# Patient Record
Sex: Female | Born: 1945 | Race: Black or African American | Hispanic: No | Marital: Married | Smoking: Former smoker
Health system: Southern US, Community
[De-identification: ages and names within clinical notes are randomized; demographics above are authoritative.]

## PROBLEM LIST (undated history)

## (undated) DIAGNOSIS — Z6841 Body Mass Index (BMI) 40.0 and over, adult: Secondary | ICD-10-CM

## (undated) DIAGNOSIS — I509 Heart failure, unspecified: Secondary | ICD-10-CM

## (undated) DIAGNOSIS — Z973 Presence of spectacles and contact lenses: Secondary | ICD-10-CM

## (undated) DIAGNOSIS — I10 Essential (primary) hypertension: Secondary | ICD-10-CM

## (undated) DIAGNOSIS — N184 Chronic kidney disease, stage 4 (severe): Secondary | ICD-10-CM

## (undated) DIAGNOSIS — I480 Paroxysmal atrial fibrillation: Secondary | ICD-10-CM

## (undated) DIAGNOSIS — J189 Pneumonia, unspecified organism: Secondary | ICD-10-CM

## (undated) DIAGNOSIS — M069 Rheumatoid arthritis, unspecified: Secondary | ICD-10-CM

## (undated) DIAGNOSIS — E119 Type 2 diabetes mellitus without complications: Secondary | ICD-10-CM

## (undated) DIAGNOSIS — T4145XA Adverse effect of unspecified anesthetic, initial encounter: Secondary | ICD-10-CM

## (undated) DIAGNOSIS — Z972 Presence of dental prosthetic device (complete) (partial): Secondary | ICD-10-CM

## (undated) DIAGNOSIS — R011 Cardiac murmur, unspecified: Secondary | ICD-10-CM

## (undated) DIAGNOSIS — K219 Gastro-esophageal reflux disease without esophagitis: Secondary | ICD-10-CM

## (undated) DIAGNOSIS — T8859XA Other complications of anesthesia, initial encounter: Secondary | ICD-10-CM

## (undated) DIAGNOSIS — G4733 Obstructive sleep apnea (adult) (pediatric): Secondary | ICD-10-CM

## (undated) DIAGNOSIS — D649 Anemia, unspecified: Secondary | ICD-10-CM

## (undated) HISTORY — PX: HYSTEROTOMY: SHX1776

## (undated) HISTORY — PX: ABDOMINAL HYSTERECTOMY: SHX81

## (undated) HISTORY — DX: Obstructive sleep apnea (adult) (pediatric): G47.33

## (undated) HISTORY — PX: COLONOSCOPY W/ BIOPSIES AND POLYPECTOMY: SHX1376

## (undated) HISTORY — PX: DILATION AND CURETTAGE OF UTERUS: SHX78

## (undated) HISTORY — PX: GALLBLADDER SURGERY: SHX652

## (undated) HISTORY — PX: KNEE ARTHROSCOPY: SUR90

## (undated) HISTORY — PX: ANKLE FUSION: SHX881

## (undated) HISTORY — DX: Paroxysmal atrial fibrillation: I48.0

## (undated) HISTORY — PX: TUBAL LIGATION: SHX77

## (undated) HISTORY — PX: MULTIPLE TOOTH EXTRACTIONS: SHX2053

## (undated) HISTORY — DX: Type 2 diabetes mellitus without complications: E11.9

## (undated) HISTORY — PX: BREAST BIOPSY: SHX20

## (undated) HISTORY — PX: CHOLECYSTECTOMY: SHX55

## (undated) HISTORY — DX: Essential (primary) hypertension: I10

---

## 2000-04-06 ENCOUNTER — Encounter: Payer: Self-pay | Admitting: Rheumatology

## 2000-04-06 ENCOUNTER — Ambulatory Visit (HOSPITAL_COMMUNITY): Admission: RE | Admit: 2000-04-06 | Discharge: 2000-04-06 | Payer: Self-pay | Admitting: Rheumatology

## 2000-08-26 ENCOUNTER — Encounter: Payer: Self-pay | Admitting: Family Medicine

## 2000-08-26 ENCOUNTER — Ambulatory Visit (HOSPITAL_COMMUNITY): Admission: RE | Admit: 2000-08-26 | Discharge: 2000-08-26 | Payer: Self-pay | Admitting: Family Medicine

## 2001-09-12 ENCOUNTER — Encounter: Payer: Self-pay | Admitting: Emergency Medicine

## 2001-09-12 ENCOUNTER — Emergency Department (HOSPITAL_COMMUNITY): Admission: EM | Admit: 2001-09-12 | Discharge: 2001-09-12 | Payer: Self-pay | Admitting: Emergency Medicine

## 2001-09-20 ENCOUNTER — Ambulatory Visit (HOSPITAL_COMMUNITY): Admission: RE | Admit: 2001-09-20 | Discharge: 2001-09-20 | Payer: Self-pay | Admitting: Family Medicine

## 2001-09-24 ENCOUNTER — Encounter: Payer: Self-pay | Admitting: Family Medicine

## 2001-09-24 ENCOUNTER — Ambulatory Visit (HOSPITAL_COMMUNITY): Admission: RE | Admit: 2001-09-24 | Discharge: 2001-09-24 | Payer: Self-pay | Admitting: Family Medicine

## 2002-11-04 ENCOUNTER — Emergency Department (HOSPITAL_COMMUNITY): Admission: EM | Admit: 2002-11-04 | Discharge: 2002-11-04 | Payer: Self-pay | Admitting: Emergency Medicine

## 2005-06-10 ENCOUNTER — Other Ambulatory Visit: Admission: RE | Admit: 2005-06-10 | Discharge: 2005-06-10 | Payer: Self-pay | Admitting: Family Medicine

## 2007-08-12 ENCOUNTER — Other Ambulatory Visit: Admission: RE | Admit: 2007-08-12 | Discharge: 2007-08-12 | Payer: Self-pay | Admitting: Family Medicine

## 2007-09-14 ENCOUNTER — Other Ambulatory Visit: Admission: RE | Admit: 2007-09-14 | Discharge: 2007-09-14 | Payer: Self-pay | Admitting: Family Medicine

## 2008-03-28 ENCOUNTER — Ambulatory Visit (HOSPITAL_BASED_OUTPATIENT_CLINIC_OR_DEPARTMENT_OTHER): Admission: RE | Admit: 2008-03-28 | Discharge: 2008-03-28 | Payer: Self-pay | Admitting: Orthopaedic Surgery

## 2008-12-21 ENCOUNTER — Emergency Department (HOSPITAL_COMMUNITY): Admission: EM | Admit: 2008-12-21 | Discharge: 2008-12-21 | Payer: Self-pay | Admitting: Emergency Medicine

## 2009-06-23 ENCOUNTER — Emergency Department (HOSPITAL_COMMUNITY): Admission: EM | Admit: 2009-06-23 | Discharge: 2009-06-23 | Payer: Self-pay | Admitting: Emergency Medicine

## 2010-04-28 ENCOUNTER — Inpatient Hospital Stay (HOSPITAL_COMMUNITY): Admission: EM | Admit: 2010-04-28 | Discharge: 2010-05-09 | Payer: Self-pay | Admitting: Emergency Medicine

## 2010-04-29 ENCOUNTER — Encounter (INDEPENDENT_AMBULATORY_CARE_PROVIDER_SITE_OTHER): Payer: Self-pay | Admitting: Internal Medicine

## 2010-05-04 ENCOUNTER — Encounter (INDEPENDENT_AMBULATORY_CARE_PROVIDER_SITE_OTHER): Payer: Self-pay | Admitting: Internal Medicine

## 2011-03-18 LAB — BODY FLUID CELL COUNT WITH DIFFERENTIAL

## 2011-03-18 LAB — DIFFERENTIAL
Basophils Absolute: 0 10*3/uL (ref 0.0–0.1)
Basophils Relative: 0 % (ref 0–1)
Eosinophils Relative: 0 % (ref 0–5)
Eosinophils Relative: 0 % (ref 0–5)
Lymphocytes Relative: 18 % (ref 12–46)
Lymphs Abs: 1.5 10*3/uL (ref 0.7–4.0)
Lymphs Abs: 3.8 10*3/uL (ref 0.7–4.0)
Monocytes Absolute: 1.7 10*3/uL — ABNORMAL HIGH (ref 0.1–1.0)
Monocytes Absolute: 1.9 10*3/uL — ABNORMAL HIGH (ref 0.1–1.0)
Monocytes Relative: 12 % (ref 3–12)
Monocytes Relative: 3 % (ref 3–12)
Monocytes Relative: 6 % (ref 3–12)
Neutro Abs: 15.2 10*3/uL — ABNORMAL HIGH (ref 1.7–7.7)
Neutro Abs: 9.4 10*3/uL — ABNORMAL HIGH (ref 1.7–7.7)
Neutrophils Relative %: 73 % (ref 43–77)
Neutrophils Relative %: 93 % — ABNORMAL HIGH (ref 43–77)
Neutrophils Relative %: 87 % — ABNORMAL HIGH (ref 43–77)

## 2011-03-18 LAB — CULTURE, BLOOD (ROUTINE X 2): Culture: NO GROWTH

## 2011-03-18 LAB — BASIC METABOLIC PANEL
BUN: 22 mg/dL (ref 6–23)
BUN: 28 mg/dL — ABNORMAL HIGH (ref 6–23)
BUN: 28 mg/dL — ABNORMAL HIGH (ref 6–23)
CO2: 21 mEq/L (ref 19–32)
Calcium: 7.9 mg/dL — ABNORMAL LOW (ref 8.4–10.5)
Calcium: 8.6 mg/dL (ref 8.4–10.5)
Calcium: 8.6 mg/dL (ref 8.4–10.5)
Chloride: 105 mEq/L (ref 96–112)
Chloride: 111 mEq/L (ref 96–112)
Creatinine, Ser: 1.75 mg/dL — ABNORMAL HIGH (ref 0.4–1.2)
GFR calc Af Amer: 36 mL/min — ABNORMAL LOW (ref >60)
GFR calc Af Amer: 45 mL/min — ABNORMAL LOW (ref >60)
GFR calc Af Amer: 47 mL/min — ABNORMAL LOW (ref >60)
GFR calc Af Amer: 50 mL/min — ABNORMAL LOW (ref >60)
GFR calc Af Amer: 58 mL/min — ABNORMAL LOW (ref >60)
GFR calc non Af Amer: 29 mL/min — ABNORMAL LOW (ref >60)
GFR calc non Af Amer: 36 mL/min — ABNORMAL LOW (ref >60)
GFR calc non Af Amer: 39 mL/min — ABNORMAL LOW (ref >60)
GFR calc non Af Amer: 41 mL/min — ABNORMAL LOW (ref >60)
GFR calc non Af Amer: 41 mL/min — ABNORMAL LOW (ref >60)
GFR calc non Af Amer: 48 mL/min — ABNORMAL LOW (ref >60)
Glucose, Bld: 133 mg/dL — ABNORMAL HIGH (ref 70–99)
Glucose, Bld: 143 mg/dL — ABNORMAL HIGH (ref 70–99)
Glucose, Bld: 231 mg/dL — ABNORMAL HIGH (ref 70–99)
Potassium: 4.1 mEq/L (ref 3.5–5.1)
Potassium: 4.2 mEq/L (ref 3.5–5.1)
Potassium: 4.5 mEq/L (ref 3.5–5.1)
Potassium: 4.7 mEq/L (ref 3.5–5.1)
Potassium: 4.8 mEq/L (ref 3.5–5.1)
Potassium: 4.8 mEq/L (ref 3.5–5.1)
Potassium: 5.1 mEq/L (ref 3.5–5.1)
Sodium: 139 mEq/L (ref 135–145)
Sodium: 140 mEq/L (ref 135–145)
Sodium: 140 mEq/L (ref 135–145)
Sodium: 142 mEq/L (ref 135–145)
Sodium: 144 mEq/L (ref 135–145)

## 2011-03-18 LAB — T4, FREE: Free T4: 0.87 ng/dL (ref 0.80–1.80)

## 2011-03-18 LAB — CBC
HCT: 28.3 % — ABNORMAL LOW (ref 36.0–46.0)
HCT: 29.8 % — ABNORMAL LOW (ref 36.0–46.0)
HCT: 30 % — ABNORMAL LOW (ref 36.0–46.0)
HCT: 31 % — ABNORMAL LOW (ref 36.0–46.0)
HCT: 36 % (ref 36.0–46.0)
Hemoglobin: 10 g/dL — ABNORMAL LOW (ref 12.0–15.0)
Hemoglobin: 9 g/dL — ABNORMAL LOW (ref 12.0–15.0)
Hemoglobin: 9.6 g/dL — ABNORMAL LOW (ref 12.0–15.0)
Hemoglobin: 9.9 g/dL — ABNORMAL LOW (ref 12.0–15.0)
MCHC: 32 g/dL (ref 30.0–36.0)
MCHC: 32.2 g/dL (ref 30.0–36.0)
MCHC: 32.4 g/dL (ref 30.0–36.0)
MCV: 87.1 fL (ref 78.0–100.0)
MCV: 87.3 fL (ref 78.0–100.0)
MCV: 87.3 fL (ref 78.0–100.0)
MCV: 88 fL (ref 78.0–100.0)
MCV: 86.7 fL (ref 78.0–100.0)
Platelets: 186 10*3/uL (ref 150–400)
Platelets: 235 10*3/uL (ref 150–400)
Platelets: 249 10*3/uL (ref 150–400)
Platelets: 251 10*3/uL (ref 150–400)
RBC: 3.13 MIL/uL — ABNORMAL LOW (ref 3.87–5.11)
RBC: 3.19 MIL/uL — ABNORMAL LOW (ref 3.87–5.11)
RBC: 3.41 MIL/uL — ABNORMAL LOW (ref 3.87–5.11)
RBC: 3.55 MIL/uL — ABNORMAL LOW (ref 3.87–5.11)
RDW: 14.4 % (ref 11.5–15.5)
RDW: 14.5 % (ref 11.5–15.5)
RDW: 14.9 % (ref 11.5–15.5)
RDW: 14.3 % (ref 11.5–15.5)
WBC: 19.6 10*3/uL — ABNORMAL HIGH (ref 4.0–10.5)
WBC: 20.9 10*3/uL — ABNORMAL HIGH (ref 4.0–10.5)
WBC: 23.5 10*3/uL — ABNORMAL HIGH (ref 4.0–10.5)
WBC: 30.1 10*3/uL — ABNORMAL HIGH (ref 4.0–10.5)
WBC: 36.8 10*3/uL — ABNORMAL HIGH (ref 4.0–10.5)

## 2011-03-18 LAB — URINALYSIS, ROUTINE W REFLEX MICROSCOPIC
Glucose, UA: NEGATIVE mg/dL
Hgb urine dipstick: NEGATIVE
Ketones, ur: NEGATIVE mg/dL
Leukocytes, UA: NEGATIVE
Protein, ur: 100 mg/dL — AB
Specific Gravity, Urine: 1.016 (ref 1.005–1.030)
Urobilinogen, UA: 0.2 mg/dL (ref 0.0–1.0)

## 2011-03-18 LAB — GLUCOSE, SEROUS FLUID: Glucose, Fluid: 142 mg/dL

## 2011-03-18 LAB — COMPREHENSIVE METABOLIC PANEL
Albumin: 2.4 g/dL — ABNORMAL LOW (ref 3.5–5.2)
Albumin: 3.2 g/dL — ABNORMAL LOW (ref 3.5–5.2)
Alkaline Phosphatase: 55 U/L (ref 39–117)
BUN: 23 mg/dL (ref 6–23)
Creatinine, Ser: 1.78 mg/dL — ABNORMAL HIGH (ref 0.4–1.2)
Glucose, Bld: 109 mg/dL — ABNORMAL HIGH (ref 70–99)
Potassium: 3.9 mEq/L (ref 3.5–5.1)
Total Bilirubin: 0.1 mg/dL — ABNORMAL LOW (ref 0.3–1.2)
Total Protein: 5.6 g/dL — ABNORMAL LOW (ref 6.0–8.3)
Total Protein: 6.8 g/dL (ref 6.0–8.3)

## 2011-03-18 LAB — AFB CULTURE WITH SMEAR (NOT AT ARMC): Acid Fast Smear: NONE SEEN

## 2011-03-18 LAB — UIFE/LIGHT CHAINS/TP QN, 24-HR UR
Albumin, U: DETECTED
Free Lambda Excretion/Day: 36.45 mg/d
Free Lambda Lt Chains,Ur: 2.7 mg/dL — ABNORMAL HIGH (ref 0.08–1.01)
Free Lt Chn Excr Rate: 174.15 mg/d
Gamma Globulin, Urine: DETECTED — AB
Time: 24 hours
Total Protein, Urine-Ur/day: 328 mg/d — ABNORMAL HIGH (ref 10–140)
Volume, Urine: 1350 mL

## 2011-03-18 LAB — IGG, IGA, IGM
IgA: 292 mg/dL (ref 68–378)
IgG (Immunoglobin G), Serum: 716 mg/dL (ref 694–1618)
IgM, Serum: 79 mg/dL (ref 60–263)

## 2011-03-18 LAB — VITAMIN B12: Vitamin B-12: 531 pg/mL (ref 211–911)

## 2011-03-18 LAB — URINE CULTURE

## 2011-03-18 LAB — SEDIMENTATION RATE: Sed Rate: 104 mm/hr — ABNORMAL HIGH (ref 0–22)

## 2011-03-18 LAB — CK TOTAL AND CKMB (NOT AT ARMC)
Relative Index: 0.8 (ref 0.0–2.5)
Total CK: 320 U/L — ABNORMAL HIGH (ref 7–177)

## 2011-03-18 LAB — PROTEIN, TOTAL: Total Protein: 6.5 g/dL (ref 6.0–8.3)

## 2011-03-18 LAB — RETICULOCYTES: RBC.: 3.54 MIL/uL — ABNORMAL LOW (ref 3.87–5.11)

## 2011-03-18 LAB — PROTEIN ELECTROPH W RFLX QUANT IMMUNOGLOBULINS
Alpha-2-Globulin: 15 % — ABNORMAL HIGH (ref 7.1–11.8)
Beta Globulin: 7.2 % (ref 4.7–7.2)
Gamma Globulin: 11.2 % (ref 11.1–18.8)
M-Spike, %: NOT DETECTED g/dL

## 2011-03-18 LAB — CARDIAC PANEL(CRET KIN+CKTOT+MB+TROPI)
Relative Index: 0.8 (ref 0.0–2.5)
Total CK: 309 U/L — ABNORMAL HIGH (ref 7–177)

## 2011-03-18 LAB — SODIUM, URINE, RANDOM: Sodium, Ur: 39 mEq/L

## 2011-03-18 LAB — BODY FLUID CULTURE: Culture: NO GROWTH

## 2011-03-18 LAB — LACTATE DEHYDROGENASE, PLEURAL OR PERITONEAL FLUID: LD, Fluid: 214 U/L

## 2011-03-18 LAB — LACTATE DEHYDROGENASE: LDH: 168 U/L (ref 94–250)

## 2011-03-18 LAB — LIPASE, BLOOD: Lipase: 18 U/L (ref 11–59)

## 2011-03-18 LAB — IRON AND TIBC
Iron: 13 ug/dL — ABNORMAL LOW (ref 42–135)
TIBC: 200 ug/dL — ABNORMAL LOW (ref 250–470)
UIBC: 187 ug/dL

## 2011-03-18 LAB — PATHOLOGIST SMEAR REVIEW

## 2011-03-18 LAB — URINE MICROSCOPIC-ADD ON

## 2011-03-18 LAB — FERRITIN: Ferritin: 229 ng/mL (ref 10–291)

## 2011-05-13 NOTE — Op Note (Signed)
Melissa Bartlett, Melissa Bartlett              ACCOUNT NO.:  [id number]   MEDICAL RECORD NO.:  [id number]          PATIENT TYPE:  AMB   LOCATION:  DSC                          FACILITY:  [facility]   PHYSICIAN:  Monico Blitz. Dalldorf, M.D.DATE OF BIRTH:  1946/06/08   DATE OF PROCEDURE:  July 21, 202009  DATE OF DISCHARGE:                               OPERATIVE REPORT   PREOPERATIVE DIAGNOSES:  1. Left knee torn medial meniscus.  2. Left knee chondromalacia patella.   POSTOPERATIVE DIAGNOSES:  1. Left knee torn medial meniscus.  2. Left knee chondromalacia patella.   PROCEDURES:  1. Left knee partial meniscectomy.  2. Left knee abrasion chondroplasty patellofemoral.   ANESTHESIA:  Knee block and general.   ATTENDING SURGEON:  Melrose Nakayama, M.D.   ASSISTANT:  Roselee Nova, PA.   INDICATIONS FOR PROCEDURE:  The patient is a 65 year old woman with a  long history of left knee pain and locking.  This has persisted despite  oral anti-inflammatories and rest.  She has pain which limits her  ability to walk and rest and she is offered an arthroscopy with MRI  evidence of a displaced meniscus tear.  Informed operative consent was  obtained after discussion of possible complications of reaction to  anesthesia and infection.   SUMMARY OF FINDINGS AND PROCEDURE:  Under general anesthesia and a  block, left knee arthroscopy was performed.  Suprapatellar pouch was  benign while the patellofemoral joint exhibited focal grade 4 change  addressed with an abrasion to bleeding bone in tiny areas.  For the most  part, the cartilage was fairly well maintained.  In the medial  compartment she had some grade II change and did have a displaced small  posterior horn medial meniscus tear addressed with removal of 15% of the  meniscus back to stable structures.  The ACL was intact.  Lateral  compartment exhibited a small radial tear of the meniscus addressed with  a partial lateral meniscectomy, but there were no  degenerative changes.   DESCRIPTION OF PROCEDURE:  The patient was taken to the operating suite  where general anesthetic was applied without difficulty.  She was also  given a block in the preanesthesia area.  She was positioned supine,  prepped and draped in normal sterile fashion.  After the administration  of IV Kefzol, an arthroscopy of the left knee was performed through a  total of two portals.  Findings were as noted above.  Procedure  consisted of the partial medial meniscectomy with basket and shaver  removing 15% of this structure back to stable rim.  Then performed a  brief chondroplasty in this compartment and concentrating on the  patellofemoral compartment with a chondroplasty and abrasion to bleeding  bone in a tiny area of the intertrochlear groove.  The knee was  thoroughly irrigated followed by placement of Marcaine with epinephrine  and morphine.  Adaptic was placed over the portals followed by dry gauze  and loose Ace wrap.  Estimated blood loss and fluids can be obtained  from the Anesthesia records.   DISPOSITION:  The patient was extubated in the operating room  and taken  to the recovery room in stable addition.  She was to go home same-day  and follow up in the office next week.  I will contact her by phone  tonight.      Monico Blitz Rhona Raider, M.D.  Electronically Signed     PGD/MEDQ  D:  September 14, 202009  T:  September 14, 202009  Job:  [id number]

## 2011-07-05 ENCOUNTER — Emergency Department (HOSPITAL_COMMUNITY): Payer: 59

## 2011-07-05 ENCOUNTER — Inpatient Hospital Stay (HOSPITAL_COMMUNITY)
Admission: EM | Admit: 2011-07-05 | Discharge: 2011-07-09 | DRG: 190 | Disposition: A | Discharge status: Home / Self Care | Payer: 59 | Attending: Internal Medicine | Admitting: Internal Medicine

## 2011-07-05 DIAGNOSIS — J441 Chronic obstructive pulmonary disease with (acute) exacerbation: Principal | ICD-10-CM | POA: Diagnosis present

## 2011-07-05 DIAGNOSIS — I472 Ventricular tachycardia, unspecified: Secondary | ICD-10-CM | POA: Diagnosis not present

## 2011-07-05 DIAGNOSIS — E876 Hypokalemia: Secondary | ICD-10-CM | POA: Diagnosis present

## 2011-07-05 DIAGNOSIS — E119 Type 2 diabetes mellitus without complications: Secondary | ICD-10-CM | POA: Diagnosis present

## 2011-07-05 DIAGNOSIS — E871 Hypo-osmolality and hyponatremia: Secondary | ICD-10-CM | POA: Diagnosis present

## 2011-07-05 DIAGNOSIS — I129 Hypertensive chronic kidney disease with stage 1 through stage 4 chronic kidney disease, or unspecified chronic kidney disease: Secondary | ICD-10-CM | POA: Diagnosis present

## 2011-07-05 DIAGNOSIS — T380X5A Adverse effect of glucocorticoids and synthetic analogues, initial encounter: Secondary | ICD-10-CM | POA: Diagnosis not present

## 2011-07-05 DIAGNOSIS — D72829 Elevated white blood cell count, unspecified: Secondary | ICD-10-CM | POA: Diagnosis not present

## 2011-07-05 DIAGNOSIS — N183 Chronic kidney disease, stage 3 unspecified: Secondary | ICD-10-CM | POA: Diagnosis present

## 2011-07-05 DIAGNOSIS — J9600 Acute respiratory failure, unspecified whether with hypoxia or hypercapnia: Secondary | ICD-10-CM | POA: Diagnosis present

## 2011-07-05 DIAGNOSIS — D649 Anemia, unspecified: Secondary | ICD-10-CM | POA: Diagnosis present

## 2011-07-05 DIAGNOSIS — R0989 Other specified symptoms and signs involving the circulatory and respiratory systems: Secondary | ICD-10-CM | POA: Diagnosis present

## 2011-07-05 DIAGNOSIS — E669 Obesity, unspecified: Secondary | ICD-10-CM | POA: Diagnosis present

## 2011-07-05 DIAGNOSIS — I4729 Other ventricular tachycardia: Secondary | ICD-10-CM | POA: Diagnosis not present

## 2011-07-05 DIAGNOSIS — F17200 Nicotine dependence, unspecified, uncomplicated: Secondary | ICD-10-CM | POA: Diagnosis present

## 2011-07-05 LAB — DIFFERENTIAL
Basophils Absolute: 0.1 10*3/uL (ref 0.0–0.1)
Eosinophils Absolute: 0.3 10*3/uL (ref 0.0–0.7)
Lymphocytes Relative: 31 % (ref 12–46)
Lymphs Abs: 3.2 10*3/uL (ref 0.7–4.0)
Neutrophils Relative %: 51 % (ref 43–77)

## 2011-07-05 LAB — CBC
MCV: 85.7 fL (ref 78.0–100.0)
Platelets: 217 10*3/uL (ref 150–400)
RBC: 4.2 MIL/uL (ref 3.87–5.11)
RDW: 14.4 % (ref 11.5–15.5)
WBC: 10.4 10*3/uL (ref 4.0–10.5)

## 2011-07-06 LAB — COMPREHENSIVE METABOLIC PANEL
ALT: 49 U/L — ABNORMAL HIGH (ref 0–35)
AST: 44 U/L — ABNORMAL HIGH (ref 0–37)
Calcium: 8.5 mg/dL (ref 8.4–10.5)
Sodium: 134 mEq/L — ABNORMAL LOW (ref 135–145)
Total Protein: 7.2 g/dL (ref 6.0–8.3)

## 2011-07-06 LAB — LIPID PANEL
Cholesterol: 150 mg/dL (ref 0–200)
Total CHOL/HDL Ratio: 3.3 RATIO
Triglycerides: 123 mg/dL (ref <150)
VLDL: 25 mg/dL (ref 0–40)

## 2011-07-06 LAB — PROTIME-INR: INR: 1.01 (ref 0.00–1.49)

## 2011-07-06 LAB — DIFFERENTIAL
Basophils Absolute: 0.1 10*3/uL (ref 0.0–0.1)
Lymphocytes Relative: 11 % — ABNORMAL LOW (ref 12–46)
Lymphs Abs: 1.1 10*3/uL (ref 0.7–4.0)
Neutro Abs: 8.4 10*3/uL — ABNORMAL HIGH (ref 1.7–7.7)
Neutrophils Relative %: 84 % — ABNORMAL HIGH (ref 43–77)

## 2011-07-06 LAB — TSH: TSH: 0.901 u[IU]/mL (ref 0.350–4.500)

## 2011-07-06 LAB — BASIC METABOLIC PANEL
CO2: 25 mEq/L (ref 19–32)
Chloride: 98 mEq/L (ref 96–112)
Creatinine, Ser: 1.56 mg/dL — ABNORMAL HIGH (ref 0.50–1.10)
GFR calc Af Amer: 40 mL/min — ABNORMAL LOW (ref >60)
Potassium: 3.3 mEq/L — ABNORMAL LOW (ref 3.5–5.1)
Sodium: 134 mEq/L — ABNORMAL LOW (ref 135–145)

## 2011-07-06 LAB — CK TOTAL AND CKMB (NOT AT ARMC)
CK, MB: 5.8 ng/mL — ABNORMAL HIGH (ref 0.3–4.0)
Relative Index: 2.5 (ref 0.0–2.5)

## 2011-07-06 LAB — CBC
HCT: 36.5 % (ref 36.0–46.0)
Hemoglobin: 11.5 g/dL — ABNORMAL LOW (ref 12.0–15.0)
MCV: 86.1 fL (ref 78.0–100.0)
RBC: 4.24 MIL/uL (ref 3.87–5.11)
RDW: 14.4 % (ref 11.5–15.5)
WBC: 9.9 10*3/uL (ref 4.0–10.5)

## 2011-07-06 LAB — D-DIMER, QUANTITATIVE: D-Dimer, Quant: 1.41 ug/mL-FEU — ABNORMAL HIGH (ref 0.00–0.48)

## 2011-07-06 LAB — GLUCOSE, CAPILLARY

## 2011-07-06 LAB — TROPONIN I: Troponin I: <0.3 ng/mL (ref <0.30)

## 2011-07-06 LAB — APTT: aPTT: 30 seconds (ref 24–37)

## 2011-07-07 LAB — BASIC METABOLIC PANEL
CO2: 23 mEq/L (ref 19–32)
Calcium: 8.6 mg/dL (ref 8.4–10.5)
Chloride: 105 mEq/L (ref 96–112)
GFR calc Af Amer: 38 mL/min — ABNORMAL LOW (ref >60)
Sodium: 138 mEq/L (ref 135–145)

## 2011-07-07 LAB — GLUCOSE, CAPILLARY
Glucose-Capillary: 278 mg/dL — ABNORMAL HIGH (ref 70–99)
Glucose-Capillary: 172 mg/dL — ABNORMAL HIGH (ref 70–99)
Glucose-Capillary: 184 mg/dL — ABNORMAL HIGH (ref 70–99)
Glucose-Capillary: 77 mg/dL (ref 70–99)

## 2011-07-07 LAB — CBC
HCT: 33.7 % — ABNORMAL LOW (ref 36.0–46.0)
RDW: 14.5 % (ref 11.5–15.5)
WBC: 20.6 10*3/uL — ABNORMAL HIGH (ref 4.0–10.5)

## 2011-07-07 LAB — PRO B NATRIURETIC PEPTIDE: Pro B Natriuretic peptide (BNP): 82.9 pg/mL (ref 0–125)

## 2011-07-08 LAB — BASIC METABOLIC PANEL
BUN: 59 mg/dL — ABNORMAL HIGH (ref 6–23)
Calcium: 8.9 mg/dL (ref 8.4–10.5)
Creatinine, Ser: 1.76 mg/dL — ABNORMAL HIGH (ref 0.50–1.10)
GFR calc Af Amer: 35 mL/min — ABNORMAL LOW (ref >60)
GFR calc non Af Amer: 29 mL/min — ABNORMAL LOW (ref >60)
Glucose, Bld: 69 mg/dL — ABNORMAL LOW (ref 70–99)
Potassium: 4 mEq/L (ref 3.5–5.1)

## 2011-07-08 LAB — CBC
HCT: 34.5 % — ABNORMAL LOW (ref 36.0–46.0)
MCH: 27.4 pg (ref 26.0–34.0)
MCHC: 31.6 g/dL (ref 30.0–36.0)
MCV: 86.7 fL (ref 78.0–100.0)
RDW: 14.8 % (ref 11.5–15.5)

## 2011-07-08 LAB — MAGNESIUM: Magnesium: 2.2 mg/dL (ref 1.5–2.5)

## 2011-07-08 LAB — GLUCOSE, CAPILLARY
Glucose-Capillary: 65 mg/dL — ABNORMAL LOW (ref 70–99)
Glucose-Capillary: 69 mg/dL — ABNORMAL LOW (ref 70–99)

## 2011-07-09 LAB — GLUCOSE, CAPILLARY: Glucose-Capillary: 144 mg/dL — ABNORMAL HIGH (ref 70–99)

## 2011-07-09 LAB — CBC
MCV: 88.5 fL (ref 78.0–100.0)
Platelets: 270 10*3/uL (ref 150–400)
RBC: 3.9 MIL/uL (ref 3.87–5.11)
WBC: 19.2 10*3/uL — ABNORMAL HIGH (ref 4.0–10.5)

## 2011-07-09 LAB — BASIC METABOLIC PANEL
CO2: 27 mEq/L (ref 19–32)
Chloride: 106 mEq/L (ref 96–112)
Creatinine, Ser: 1.61 mg/dL — ABNORMAL HIGH (ref 0.50–1.10)

## 2011-07-10 LAB — GLUCOSE, CAPILLARY: Glucose-Capillary: 92 mg/dL (ref 70–99)

## 2011-07-22 NOTE — H&P (Signed)
Melissa Bartlett, Melissa Bartlett              ACCOUNT NO.:  [id number]  MEDICAL RECORD NO.:  [id number]  LOCATION:  43                         FACILITY:  [facility]  PHYSICIAN:  Farris Has, MDDATE OF BIRTH:  03-Feb-1946  DATE OF ADMISSION:  07/06/2011 DATE OF DISCHARGE:                             HISTORY & PHYSICAL   PRIMARY CARE PROVIDER:  Dr. Donald Prose at [facility].  CHIEF COMPLAINT:  Feeling unwell and chest tightness after severe coughing.  HISTORY OF PRESENT ILLNESS:  The patient is a 65 year old female with history of hypertension, tobacco abuse, and chronic renal insufficiency with baseline creatinine around 1.4 to 1.5.  The patient reportedly has been doing fairly well up until this week when she developed nausea, vomiting, and diarrhea.  At this point, this has resolved but she started to have some cough, although no fevers.  When she was coughing extensively, she developed some chest tightness as well and also may be even some chest pain with coughing, but mainly just chest tightness that lasted a few hours up until she presented to the emergency department. In ER, she was found to have mild hypoxia at rest of 90% and hypoxia down to 88% when she was ambulating.  At which point, the patient was also found to be wheezy.  She was given albuterol and Atrovent nebs with some movement, but continued to require oxygen and exertion at which point triad hospitalist was called for an admission.  Otherwise, she denied any leg swelling.  No abdominal discomfort currently.  Her nausea and vomiting has resolved.  Otherwise, review of systems is negative except for HPI.  Ten systems reviewed.  PAST MEDICAL HISTORY:  Significant for: 1. Hypertension. 2. Anxiety. 3. Tobacco abuse. 4. Hypertension.  SOCIAL HISTORY:  The patient continues to smoke, but states that she is trying to quit since yesterday and actually started to use patches. Does not drink or abuse drugs.  FAMILY HISTORY:   Noncontributory.  ALLERGIES:  She is allergic to ENALAPRIL, but does tolerate losartan. She also has itching to PERCOCET, HYDROCODONE, and CODEINE.  MEDICATIONS: 1. Carvedilol 25 mg twice daily. 2. Lasix 40 mg daily. 3. Amlodipine 10 mg daily. 4. Flonase 50 mcg daily. 5. Losartan/hydrochlorothiazide 100/25 mg daily. 6. Potassium 20 mEq daily. 7. Multivitamins 1 tablet daily.  The patient states that this is     correct, but still would have pharmacy review her medications in     the morning.  PHYSICAL EXAMINATION:  VITAL SIGNS:  Temperature 98.0, blood pressure 142/85, pulse 88, respirations 22, satting 95% on 2 L.  The patient appears to be in no acute distress currently. HEENT:  Head:  Nontraumatic.  Moist mucous membranes. LUNGS:  I still hear wheezes throughout and somewhat distant breath sounds. HEART:  Regular rate rhythm.  No murmurs appreciated, but somewhat distant. ABDOMEN:  Mildly obese, but nontender. LOWER EXTREMITIES:  Without clubbing, cyanosis, or edema.  Of note, she does have a chronically slightly more swollen right ankle, which is secondary to old injury. NEUROLOGIC:  Grossly intact. SKIN:  Clean, dry, intact.  LABORATORY DATA:  White blood cell count 10.4, hemoglobin 11.6.  Sodium 134, potassium 3.4, and creatinine 1.56, which  is up from her baseline of 1.46.  Chest x-ray showing small right effusion and central venous congestion, which is extremely mild.  A small right effusion seems to be persistent.  EKG showing no ischemic changes.  ASSESSMENT AND PLAN:  This is a 65 year old female who presents with what seems to be a reactive airway disease/chronic obstructive pulmonary disease exacerbation with hypoxia and shortness of breath as well as chest tightness, which could be respiratory versus cardiac in etiology. 1. Possible chronic obstructive pulmonary disease exacerbation.  She     does not carry a diagnosis of chronic obstructive pulmonary disease      up until now, but she does have heavy tobacco abuse.  We will admit     for presumptive chronic obstructive pulmonary disease exacerbation.     We will start her on prednisone.  She already got Solu-Medrol in     the emergency department.  We will write for albuterol and Atrovent     nebs, O2, Mucinex, Avelox for antibiotics for now. 2. Chest tightness.  I think this is more of a respiratory, but given     her risk factors, we will cycle cardiac markers and we will check     tele.  Also, for completion would check D-dimer.  Given some     congestion on her chest x-ray and persistent right pleural     effusion, will get an echogram to evaluate her cardiac function. 3. Hypertension.  Continue her home meds except for     hydrochlorothiazide since her sodium was slightly low.  Continue     her Lasix right now.  Her creatinine is around her baseline. 4. Slight hypokalemia.  We will replace being mindful of her chronic     renal insufficiency. 5. Chronic renal insufficiency.  We will continue to monitor her     creatinine as part of blood pressure related. 6. Hypoxia.  Write for oxygen and she may need to be discharged on     oxygen with exertion. 7. Prophylaxis.  Protonix and Lovenox.  I had spent a total about an hour on this admission.     Farris Has, MD     AVD/MEDQ  D:  07/06/2011  T:  07/06/2011  Job:  [id number]  cc:   Dr. Donald Prose  Electronically Signed by Toy Baker MD on 07/22/2011 10:16:52 PM

## 2011-07-22 NOTE — Discharge Summary (Signed)
Melissa Bartlett, Melissa Bartlett              ACCOUNT NO.:  [id number]  MEDICAL RECORD NO.:  [id number]  LOCATION:  62                         FACILITY:  [facility]  PHYSICIAN:  Vernell Leep, MD     DATE OF BIRTH:  17-Mar-1946  DATE OF ADMISSION:  07/05/2011 DATE OF DISCHARGE:  07/09/2011                              DISCHARGE SUMMARY   PRIMARY CARE PHYSICIAN:  Donald Prose, M.D.  DISCHARGE DIAGNOSES: 1. Acute hypoxic respiratory failure secondary to chronic obstructive     pulmonary disease exacerbation.  Acute respiratory failure     resolved. 2. Acute exacerbation of chronic obstructive pulmonary disease,     resolved. 3. Newly diagnosed type 2 diabetes mellitus. 4. Tobacco abuse. 5. Stage 3 chronic kidney disease. 6. Hypokalemia, repleted. 7. Pseudohyponatremia. 8. Obesity. 9. Normocytic anemia, for outpatient followup and evaluation. 10.Leukocytosis, likely secondary to steroids.  DISCHARGE MEDICATIONS: 1. Moxifloxacin 400 mg p.o. daily for an additional 2 days. 2. Nicotine 21 mg per 24-hour patch transdermally daily. 3. Enteric-coated aspirin 81 mg p.o. daily. 4. Amlodipine 10 mg p.o. daily. 5. Carvedilol 25 mg p.o. b.i.d. 6. Flonase 1-2 sprays nasally daily. 7. Furosemide 40 mg p.o. daily. 8. Losartan/hydrochlorothiazide, 100/25 mg, 1 tablet p.o. daily. 9. Multivitamins 1 tablet p.o. daily. 10.Potassium chloride 20 mEq p.o. daily. 11.Tramadol 50 mg p.o. q.8 hourly p.r.n. pain. 12.Ventolin inhaler 1-2 puffs inhaled q.4 hourly p.r.n. for dyspnea. 13.Women's high potency vitamin over-the-counter 1 tablet p.o. daily.  IMAGING:  Chest x-ray on July 7.  Impression, small right pleural effusion and central venous congestion.  1. 2-D echocardiogram showed left ventricle cavity size was normal.     Left ventricle systolic function was normal.  Left ventricle     ejection fraction was 60%-65% with normal wall motion and no     regional wall motion abnormalities.  There was an  increased     relative contribution of atrial contraction to ventricular filling.     Doppler parameters were consistent with abnormal left ventricular     relaxation or grade 1 diastolic dysfunction. 2. Mild aortic valve regurgitation.  LABORATORY DATA:  Basic metabolic panel significant for BUN 53, creatinine 1.61.  CBC, hemoglobin 11.2, hematocrit 35, white blood cell 19.2, and platelets of 270.  Magnesium 2.2.  Pro-BNP was 82.9, TSH 0.901, hemoglobin A1c 6.9.  Lipid panel within normal limits.  Hepatic panel significant for AST 44, ALT 49, otherwise within normal limits. Cardiac enzymes x1 was negative for features of acute coronary syndrome. D-dimer was 1.41.  CONSULTATIONS:  Diabetes treatment program.  DIET:  Diabetic diet.  ACTIVITY:  Ad lib.  COMPLAINTS TODAY:  The patient indicates that her breathing is almost to the baseline.  She denies any other complaints and is eager to go home.  PHYSICAL EXAMINATION:  GENERAL:  The patient is in no obvious distress. VITAL SIGNS:  Temperature is 97.5 degrees Fahrenheit, pulse 61 per minute and regular, respiration 18 per minute, blood pressure 118/72 mmHg, oxygen saturation of 97% on room air.  Telemetry shows sinus bradycardia in the 50s to sinus rhythm in the 60s.  The patient has had no further episodes of nonsustained ventricular tachycardia. RESPIRATORY SYSTEM:  Occasional rhonchi posteriorly,  but otherwise good breath sounds bilaterally and no increased work of breathing and clear. CARDIOVASCULAR SYSTEM:  First and second heart sounds heard, regular. No JVD. ABDOMEN:  Nondistended, nontender, soft, and normal bowel sounds heard. CENTRAL NERVOUS SYSTEM:  The patient is awake, alert, and oriented x3 with no focal neurological deficits.  HOSPITAL COURSE:  Ms. Melissa Bartlett is a 65 year old African American female patient with history of hypertension, chronic kidney disease with baseline creatinine between 1.4 and 1.5, tobacco abuse  who might have had a diagnosis of COPD given that she was on albuterol inhaler at home. She now presented with history of nausea, vomiting, and diarrhea, which resolved, but then she started having cough, chest tightness, chest pain with coughing.  In the emergency department, she had mild hypoxia at rest of 90%, which went down to 88% with ambulation.  She was also wheezing.  She was thereby admitted for further evaluation and management.  1. Mild acute hypoxic respiratory failure, which was possibly from     acute exacerbation of COPD.  The patient was treated in the     appropriate fashion with steroids, brief course of antibiotics,     bronchodilators, and cough medications.  With these measures, the     patient has gradually and surely improved.  She indicates that her     breathing is almost at baseline.  She has been counseled repeatedly     regarding tobacco cessation. 2. Acute exacerbation of COPD.  Management as above.  Acute     exacerbation resolved.  The patient had elevated D-dimer, but it     was determined that this was less likely a PE given her dramatic     improvement with the treatment for COPD. 3. Newly diagnosed type 2 diabetes mellitus.  Because of the steroids,     the patient had elevated blood sugars and was placed on sliding     scale insulin, and mealtime NovoLog as well as Glucotrol 10 mg.     This, however, led to couple of hypoglycemic episodes yesterday.     This was about the time when her steroids were being tapered.  Her     medications were stopped and was placed just on sliding scale     insulin.  Over the last 24 hours, she has required minimal amounts     of insulin.  We believe that her diabetes may be controlled by diet     and exercise alone.  However, she has been advised to check her     CBGs 3 times a day before meals and at bedtime and maintain a log     and carry it with her to her primary care physician to determine if     she would need  any medications. 4. Tobacco abuse.  Cessation counseling done.  The patient indicates     that she is definitely going to quit at this time. 5. Chronic kidney disease stage 3.  Her creatinine seems to be stable.     The patient did not know about this diagnosis.  Recommend     outpatient consultation and followup with Nephrology. 6. Hypokalemia, repleted. 7. Leukocytosis, likely from demargination from steroids, improving.     No clinical evidence of sepsis. 8. Normocytic anemia, possibly from chronic kidney disease.  Recommend     outpatient evaluation as deemed necessary. 9. One or two episodes of 3-4 beat nonsustained ventricular     tachycardia.  The patient has normal left  ventricle function on     echocardiogram.  Her potassium was normal and so was her magnesium.     This was possibly precipitated from her acute respiratory status.     The patient is on beta blockers, which she is to continue.  DISPOSITION:  The patient is discharged home in stable condition.  FOLLOWUP RECOMMENDATIONS:  With Dr. Donald Prose.  The patient is to call for an appointment, to be seen in 5-7 days' time.  Time taken in coordinating this discharge was 35 minutes.     Vernell Leep, MD     AH/MEDQ  D:  07/09/2011  T:  07/09/2011  Job:  [id number]  cc:   Donald Prose, M.D. Fax: [phone]  Electronically Signed by Vernell Leep MD on 07/22/2011 10:43:45 PM

## 2011-07-31 ENCOUNTER — Encounter: Payer: Self-pay | Admitting: Podiatry

## 2011-08-20 ENCOUNTER — Encounter: Payer: Self-pay | Admitting: *Deleted

## 2011-08-20 ENCOUNTER — Encounter: Payer: 59 | Attending: Family Medicine | Admitting: *Deleted

## 2011-08-20 DIAGNOSIS — E119 Type 2 diabetes mellitus without complications: Secondary | ICD-10-CM | POA: Insufficient documentation

## 2011-08-20 DIAGNOSIS — Z713 Dietary counseling and surveillance: Secondary | ICD-10-CM | POA: Insufficient documentation

## 2011-08-20 NOTE — Progress Notes (Signed)
  Patient was seen on 08/20/2011 for the first of a series of three diabetes self-management courses at the [facility]. The following learning objectives were met by the patient during this course:   Defines diabetes and the role of insulin  Identifies type of diabetes and pathophysiology  States normal BG range and personal goals  Identifies three risk factors for the development of diabetes  States the need for and frequency of healthcare follow up (ADA Standards of Care)  Lab Results  Component Value Date   HGBA1C 6.9* 07/06/2011  \  Patient has established the following initial goals:  Increase exercise  Follow diabetes meal plan  Lose weight  Follow-Up Plan: Attend Core Diabetes Classes @ [facility]

## 2011-08-20 NOTE — Patient Instructions (Signed)
Patient will attend Core Diabetes Courses as scheduled or follow up prn.

## 2011-09-09 ENCOUNTER — Encounter: Payer: 59 | Attending: Family Medicine

## 2011-09-09 DIAGNOSIS — Z713 Dietary counseling and surveillance: Secondary | ICD-10-CM | POA: Insufficient documentation

## 2011-09-09 DIAGNOSIS — E119 Type 2 diabetes mellitus without complications: Secondary | ICD-10-CM | POA: Insufficient documentation

## 2011-09-10 NOTE — Progress Notes (Signed)
  Patient was seen on 09/09/11 for the second of a series of three diabetes self-management courses at the [facility]. The following learning objectives were met by the patient during this course:   States the relationship of exercise to blood glucose  States benefits/barriers of regular and safe exercise  States three guidelines for safe and effective exercise  Describes personal diabetes medicine regimen  Describes actions of own medications  Describes causes, symptoms, and treatment of hypo/hyperglycemia  Describes sick day rules  Identifies when to test urine for ketones when appropriate  Identifies when to call healthcare provider for acute complications  States the risk for problems with foot, skin, and dental care  States preventative foot, skin, and dental care measures  States when to call healthcare provider regarding foot, skin, and dental care  Identifies methods for evaluation of diabetes control  Discusses benefits of SBGM  Identifies relationship between nutrition, exercise, medication, and glucose levels  Discusses the importance of record keeping  *Patient received [facility] Core Program Notebook at class.  Follow-Up Plan: Patient will attend the final class of the ADA Diabetes Self-Care Education.

## 2011-09-16 ENCOUNTER — Encounter: Payer: 59 | Admitting: Dietician

## 2011-09-17 NOTE — Progress Notes (Signed)
  Patient was seen on 09/16/2011 for the third of a series of three diabetes self-management courses at the [facility]. The following learning objectives were met by the patient during this course:   Identifies nutrient effects on glycemia  States the general guidelines of meal planning  Relates understanding of personal meal plan  Describes situations that cause stress and discuss methods of stress management  Identifies lifestyle behaviors for change  The following handouts were given in class:  [employer] Carbohydrate Counting book  3 Month Follow Up Visit handout  Goal setting handout  Class evaluation form  Your patient has established the following 3 month goals for diabetes self-care:  Walk /aerobic exercise on a regular basis.  Loose at least 15 lbs over the next 3 months.  Follow-Up Plan: Patient will attend a 3 month follow-up visit for diabetes self-management education.

## 2011-09-22 LAB — POCT HEMOGLOBIN-HEMACUE: Hemoglobin: 12

## 2011-09-22 LAB — BASIC METABOLIC PANEL
GFR calc Af Amer: 56 — ABNORMAL LOW
GFR calc non Af Amer: 47 — ABNORMAL LOW
Glucose, Bld: 93
Potassium: 4.2
Sodium: 139

## 2011-09-29 ENCOUNTER — Other Ambulatory Visit: Payer: Self-pay | Admitting: Podiatry

## 2011-09-29 DIAGNOSIS — M79673 Pain in unspecified foot: Secondary | ICD-10-CM

## 2011-10-01 ENCOUNTER — Ambulatory Visit
Admission: RE | Admit: 2011-10-01 | Discharge: 2011-10-01 | Disposition: A | Discharge status: Home / Self Care | Payer: 59 | Source: Ambulatory Visit | Attending: Podiatry | Admitting: Podiatry

## 2011-10-01 DIAGNOSIS — M79673 Pain in unspecified foot: Secondary | ICD-10-CM

## 2012-01-06 DIAGNOSIS — M79609 Pain in unspecified limb: Secondary | ICD-10-CM | POA: Diagnosis not present

## 2012-01-06 DIAGNOSIS — R269 Unspecified abnormalities of gait and mobility: Secondary | ICD-10-CM | POA: Diagnosis not present

## 2012-01-06 DIAGNOSIS — M216X9 Other acquired deformities of unspecified foot: Secondary | ICD-10-CM | POA: Diagnosis not present

## 2012-01-06 DIAGNOSIS — M19079 Primary osteoarthritis, unspecified ankle and foot: Secondary | ICD-10-CM | POA: Diagnosis not present

## 2012-01-14 DIAGNOSIS — M79609 Pain in unspecified limb: Secondary | ICD-10-CM | POA: Diagnosis not present

## 2012-01-14 DIAGNOSIS — J449 Chronic obstructive pulmonary disease, unspecified: Secondary | ICD-10-CM | POA: Diagnosis not present

## 2012-01-14 DIAGNOSIS — R011 Cardiac murmur, unspecified: Secondary | ICD-10-CM | POA: Diagnosis not present

## 2012-01-14 DIAGNOSIS — Z01810 Encounter for preprocedural cardiovascular examination: Secondary | ICD-10-CM | POA: Diagnosis not present

## 2012-01-14 DIAGNOSIS — I519 Heart disease, unspecified: Secondary | ICD-10-CM | POA: Diagnosis not present

## 2012-01-14 DIAGNOSIS — I119 Hypertensive heart disease without heart failure: Secondary | ICD-10-CM | POA: Diagnosis not present

## 2012-02-02 DIAGNOSIS — M6240 Contracture of muscle, unspecified site: Secondary | ICD-10-CM | POA: Diagnosis not present

## 2012-02-02 DIAGNOSIS — I10 Essential (primary) hypertension: Secondary | ICD-10-CM | POA: Diagnosis not present

## 2012-02-02 DIAGNOSIS — M25579 Pain in unspecified ankle and joints of unspecified foot: Secondary | ICD-10-CM | POA: Diagnosis not present

## 2012-02-02 DIAGNOSIS — M19079 Primary osteoarthritis, unspecified ankle and foot: Secondary | ICD-10-CM | POA: Diagnosis not present

## 2012-02-02 DIAGNOSIS — M216X9 Other acquired deformities of unspecified foot: Secondary | ICD-10-CM | POA: Diagnosis not present

## 2012-02-10 DIAGNOSIS — M216X9 Other acquired deformities of unspecified foot: Secondary | ICD-10-CM | POA: Diagnosis not present

## 2012-03-16 DIAGNOSIS — M216X9 Other acquired deformities of unspecified foot: Secondary | ICD-10-CM | POA: Diagnosis not present

## 2012-04-06 DIAGNOSIS — Z1331 Encounter for screening for depression: Secondary | ICD-10-CM | POA: Diagnosis not present

## 2012-04-06 DIAGNOSIS — E1129 Type 2 diabetes mellitus with other diabetic kidney complication: Secondary | ICD-10-CM | POA: Diagnosis not present

## 2012-04-07 DIAGNOSIS — E119 Type 2 diabetes mellitus without complications: Secondary | ICD-10-CM | POA: Diagnosis not present

## 2012-04-13 DIAGNOSIS — M216X9 Other acquired deformities of unspecified foot: Secondary | ICD-10-CM | POA: Diagnosis not present

## 2012-05-11 DIAGNOSIS — M216X9 Other acquired deformities of unspecified foot: Secondary | ICD-10-CM | POA: Diagnosis not present

## 2012-05-12 DIAGNOSIS — F17200 Nicotine dependence, unspecified, uncomplicated: Secondary | ICD-10-CM | POA: Diagnosis not present

## 2012-05-12 DIAGNOSIS — I10 Essential (primary) hypertension: Secondary | ICD-10-CM | POA: Diagnosis not present

## 2012-06-08 DIAGNOSIS — R609 Edema, unspecified: Secondary | ICD-10-CM | POA: Diagnosis not present

## 2012-06-15 DIAGNOSIS — L03317 Cellulitis of buttock: Secondary | ICD-10-CM | POA: Diagnosis not present

## 2012-08-17 DIAGNOSIS — N390 Urinary tract infection, site not specified: Secondary | ICD-10-CM | POA: Diagnosis not present

## 2012-08-31 DIAGNOSIS — M9390 Osteochondropathy, unspecified of unspecified site: Secondary | ICD-10-CM | POA: Diagnosis not present

## 2012-08-31 DIAGNOSIS — M722 Plantar fascial fibromatosis: Secondary | ICD-10-CM | POA: Diagnosis not present

## 2012-08-31 DIAGNOSIS — S9030XA Contusion of unspecified foot, initial encounter: Secondary | ICD-10-CM | POA: Diagnosis not present

## 2012-08-31 DIAGNOSIS — R609 Edema, unspecified: Secondary | ICD-10-CM | POA: Diagnosis not present

## 2012-09-07 DIAGNOSIS — N390 Urinary tract infection, site not specified: Secondary | ICD-10-CM | POA: Diagnosis not present

## 2012-09-07 DIAGNOSIS — R109 Unspecified abdominal pain: Secondary | ICD-10-CM | POA: Diagnosis not present

## 2012-10-05 DIAGNOSIS — Z23 Encounter for immunization: Secondary | ICD-10-CM | POA: Diagnosis not present

## 2012-10-05 DIAGNOSIS — I10 Essential (primary) hypertension: Secondary | ICD-10-CM | POA: Diagnosis not present

## 2012-10-05 DIAGNOSIS — E1129 Type 2 diabetes mellitus with other diabetic kidney complication: Secondary | ICD-10-CM | POA: Diagnosis not present

## 2012-11-10 DIAGNOSIS — I10 Essential (primary) hypertension: Secondary | ICD-10-CM | POA: Diagnosis not present

## 2012-11-30 DIAGNOSIS — M19079 Primary osteoarthritis, unspecified ankle and foot: Secondary | ICD-10-CM | POA: Diagnosis not present

## 2012-11-30 DIAGNOSIS — Z79899 Other long term (current) drug therapy: Secondary | ICD-10-CM | POA: Diagnosis not present

## 2012-11-30 DIAGNOSIS — M779 Enthesopathy, unspecified: Secondary | ICD-10-CM | POA: Diagnosis not present

## 2012-12-01 DIAGNOSIS — J209 Acute bronchitis, unspecified: Secondary | ICD-10-CM | POA: Diagnosis not present

## 2012-12-01 DIAGNOSIS — J309 Allergic rhinitis, unspecified: Secondary | ICD-10-CM | POA: Diagnosis not present

## 2013-03-01 DIAGNOSIS — G589 Mononeuropathy, unspecified: Secondary | ICD-10-CM | POA: Diagnosis not present

## 2013-03-01 DIAGNOSIS — M79609 Pain in unspecified limb: Secondary | ICD-10-CM | POA: Diagnosis not present

## 2013-04-20 DIAGNOSIS — J019 Acute sinusitis, unspecified: Secondary | ICD-10-CM | POA: Diagnosis not present

## 2013-04-20 DIAGNOSIS — Z1331 Encounter for screening for depression: Secondary | ICD-10-CM | POA: Diagnosis not present

## 2013-04-26 DIAGNOSIS — G589 Mononeuropathy, unspecified: Secondary | ICD-10-CM | POA: Diagnosis not present

## 2013-05-03 DIAGNOSIS — J309 Allergic rhinitis, unspecified: Secondary | ICD-10-CM | POA: Diagnosis not present

## 2013-05-03 DIAGNOSIS — E1129 Type 2 diabetes mellitus with other diabetic kidney complication: Secondary | ICD-10-CM | POA: Diagnosis not present

## 2013-05-03 DIAGNOSIS — I10 Essential (primary) hypertension: Secondary | ICD-10-CM | POA: Diagnosis not present

## 2013-05-11 DIAGNOSIS — R011 Cardiac murmur, unspecified: Secondary | ICD-10-CM | POA: Diagnosis not present

## 2013-05-11 DIAGNOSIS — I10 Essential (primary) hypertension: Secondary | ICD-10-CM | POA: Diagnosis not present

## 2013-05-11 DIAGNOSIS — E1129 Type 2 diabetes mellitus with other diabetic kidney complication: Secondary | ICD-10-CM | POA: Diagnosis not present

## 2013-05-11 DIAGNOSIS — F17200 Nicotine dependence, unspecified, uncomplicated: Secondary | ICD-10-CM | POA: Diagnosis not present

## 2013-05-24 DIAGNOSIS — G589 Mononeuropathy, unspecified: Secondary | ICD-10-CM | POA: Diagnosis not present

## 2013-06-01 DIAGNOSIS — G589 Mononeuropathy, unspecified: Secondary | ICD-10-CM | POA: Diagnosis not present

## 2013-06-10 DIAGNOSIS — G5760 Lesion of plantar nerve, unspecified lower limb: Secondary | ICD-10-CM | POA: Diagnosis not present

## 2013-06-15 DIAGNOSIS — I10 Essential (primary) hypertension: Secondary | ICD-10-CM | POA: Diagnosis not present

## 2013-06-21 DIAGNOSIS — G5760 Lesion of plantar nerve, unspecified lower limb: Secondary | ICD-10-CM | POA: Diagnosis not present

## 2013-06-29 DIAGNOSIS — G5760 Lesion of plantar nerve, unspecified lower limb: Secondary | ICD-10-CM | POA: Diagnosis not present

## 2013-07-11 DIAGNOSIS — F17200 Nicotine dependence, unspecified, uncomplicated: Secondary | ICD-10-CM | POA: Diagnosis not present

## 2013-07-11 DIAGNOSIS — N182 Chronic kidney disease, stage 2 (mild): Secondary | ICD-10-CM | POA: Diagnosis not present

## 2013-07-11 DIAGNOSIS — I10 Essential (primary) hypertension: Secondary | ICD-10-CM | POA: Diagnosis not present

## 2013-08-17 DIAGNOSIS — I119 Hypertensive heart disease without heart failure: Secondary | ICD-10-CM | POA: Diagnosis not present

## 2013-08-17 DIAGNOSIS — I10 Essential (primary) hypertension: Secondary | ICD-10-CM | POA: Diagnosis not present

## 2013-08-17 DIAGNOSIS — R011 Cardiac murmur, unspecified: Secondary | ICD-10-CM | POA: Diagnosis not present

## 2013-08-17 DIAGNOSIS — J449 Chronic obstructive pulmonary disease, unspecified: Secondary | ICD-10-CM | POA: Diagnosis not present

## 2013-08-17 DIAGNOSIS — E1129 Type 2 diabetes mellitus with other diabetic kidney complication: Secondary | ICD-10-CM | POA: Diagnosis not present

## 2013-08-17 DIAGNOSIS — F17200 Nicotine dependence, unspecified, uncomplicated: Secondary | ICD-10-CM | POA: Diagnosis not present

## 2013-08-24 DIAGNOSIS — G5760 Lesion of plantar nerve, unspecified lower limb: Secondary | ICD-10-CM | POA: Diagnosis not present

## 2013-08-24 DIAGNOSIS — M779 Enthesopathy, unspecified: Secondary | ICD-10-CM | POA: Diagnosis not present

## 2013-08-24 DIAGNOSIS — M19079 Primary osteoarthritis, unspecified ankle and foot: Secondary | ICD-10-CM | POA: Diagnosis not present

## 2013-10-03 ENCOUNTER — Other Ambulatory Visit: Payer: Self-pay | Admitting: Cardiology

## 2013-10-10 ENCOUNTER — Other Ambulatory Visit: Payer: Self-pay | Admitting: Cardiology

## 2013-10-19 DIAGNOSIS — J209 Acute bronchitis, unspecified: Secondary | ICD-10-CM | POA: Diagnosis not present

## 2013-10-28 ENCOUNTER — Other Ambulatory Visit: Payer: Self-pay | Admitting: Family Medicine

## 2013-10-28 ENCOUNTER — Ambulatory Visit
Admission: RE | Admit: 2013-10-28 | Discharge: 2013-10-28 | Disposition: A | Discharge status: Home / Self Care | Payer: Medicare Other | Source: Ambulatory Visit | Attending: Family Medicine | Admitting: Family Medicine

## 2013-10-28 DIAGNOSIS — J209 Acute bronchitis, unspecified: Secondary | ICD-10-CM | POA: Diagnosis not present

## 2013-10-28 DIAGNOSIS — J40 Bronchitis, not specified as acute or chronic: Secondary | ICD-10-CM | POA: Diagnosis not present

## 2013-11-08 ENCOUNTER — Encounter: Payer: Self-pay | Admitting: Cardiology

## 2013-12-24 DIAGNOSIS — R109 Unspecified abdominal pain: Secondary | ICD-10-CM | POA: Diagnosis not present

## 2014-01-03 ENCOUNTER — Ambulatory Visit
Admission: RE | Admit: 2014-01-03 | Discharge: 2014-01-03 | Disposition: A | Discharge status: Home / Self Care | Payer: Medicare Other | Source: Ambulatory Visit | Attending: Family Medicine | Admitting: Family Medicine

## 2014-01-03 ENCOUNTER — Other Ambulatory Visit: Payer: Self-pay | Admitting: Family Medicine

## 2014-01-03 DIAGNOSIS — R0989 Other specified symptoms and signs involving the circulatory and respiratory systems: Secondary | ICD-10-CM

## 2014-01-03 DIAGNOSIS — I10 Essential (primary) hypertension: Secondary | ICD-10-CM | POA: Diagnosis not present

## 2014-01-03 DIAGNOSIS — R059 Cough, unspecified: Secondary | ICD-10-CM

## 2014-01-03 DIAGNOSIS — R112 Nausea with vomiting, unspecified: Secondary | ICD-10-CM | POA: Diagnosis not present

## 2014-01-03 DIAGNOSIS — R5383 Other fatigue: Secondary | ICD-10-CM | POA: Diagnosis not present

## 2014-01-03 DIAGNOSIS — R5381 Other malaise: Secondary | ICD-10-CM | POA: Diagnosis not present

## 2014-01-03 DIAGNOSIS — R05 Cough: Secondary | ICD-10-CM

## 2014-01-11 DIAGNOSIS — H409 Unspecified glaucoma: Secondary | ICD-10-CM | POA: Diagnosis not present

## 2014-01-11 DIAGNOSIS — I10 Essential (primary) hypertension: Secondary | ICD-10-CM | POA: Diagnosis not present

## 2014-01-11 DIAGNOSIS — N183 Chronic kidney disease, stage 3 unspecified: Secondary | ICD-10-CM | POA: Diagnosis not present

## 2014-01-11 DIAGNOSIS — Z0000 Encounter for general adult medical examination without abnormal findings: Secondary | ICD-10-CM | POA: Diagnosis not present

## 2014-01-11 DIAGNOSIS — Z23 Encounter for immunization: Secondary | ICD-10-CM | POA: Diagnosis not present

## 2014-01-11 DIAGNOSIS — N390 Urinary tract infection, site not specified: Secondary | ICD-10-CM | POA: Diagnosis not present

## 2014-01-11 DIAGNOSIS — E1129 Type 2 diabetes mellitus with other diabetic kidney complication: Secondary | ICD-10-CM | POA: Diagnosis not present

## 2014-01-25 DIAGNOSIS — Z1231 Encounter for screening mammogram for malignant neoplasm of breast: Secondary | ICD-10-CM | POA: Diagnosis not present

## 2014-01-31 ENCOUNTER — Other Ambulatory Visit: Payer: Self-pay | Admitting: Gastroenterology

## 2014-01-31 DIAGNOSIS — Z86010 Personal history of colonic polyps: Secondary | ICD-10-CM | POA: Diagnosis not present

## 2014-01-31 DIAGNOSIS — D126 Benign neoplasm of colon, unspecified: Secondary | ICD-10-CM | POA: Diagnosis not present

## 2014-01-31 DIAGNOSIS — Z09 Encounter for follow-up examination after completed treatment for conditions other than malignant neoplasm: Secondary | ICD-10-CM | POA: Diagnosis not present

## 2014-02-07 DIAGNOSIS — N6009 Solitary cyst of unspecified breast: Secondary | ICD-10-CM | POA: Diagnosis not present

## 2014-02-07 DIAGNOSIS — R928 Other abnormal and inconclusive findings on diagnostic imaging of breast: Secondary | ICD-10-CM | POA: Diagnosis not present

## 2014-02-13 ENCOUNTER — Ambulatory Visit: Payer: Medicare Other | Admitting: Cardiology

## 2014-02-14 ENCOUNTER — Ambulatory Visit: Payer: Medicare Other | Admitting: Cardiology

## 2014-03-07 ENCOUNTER — Encounter: Payer: Self-pay | Admitting: Cardiology

## 2014-03-07 ENCOUNTER — Ambulatory Visit (INDEPENDENT_AMBULATORY_CARE_PROVIDER_SITE_OTHER): Payer: Medicare Other | Admitting: Cardiology

## 2014-03-07 VITALS — BP 200/74 | HR 68 | Ht 62.0 in | Wt 227.0 lb

## 2014-03-07 DIAGNOSIS — I10 Essential (primary) hypertension: Secondary | ICD-10-CM | POA: Diagnosis not present

## 2014-03-07 DIAGNOSIS — E119 Type 2 diabetes mellitus without complications: Secondary | ICD-10-CM

## 2014-03-07 DIAGNOSIS — E1129 Type 2 diabetes mellitus with other diabetic kidney complication: Secondary | ICD-10-CM | POA: Insufficient documentation

## 2014-03-07 DIAGNOSIS — F17200 Nicotine dependence, unspecified, uncomplicated: Secondary | ICD-10-CM | POA: Diagnosis not present

## 2014-03-07 NOTE — Progress Notes (Signed)
[facility]. [address], [place]  [address] Phone: [phone] Fax:  [phone]  Date:  03/07/2014   ID:  Melissa Bartlett, DOB 10-Apr-1946, MRN [id number]  PCP:  No primary provider on file.   History of Present Illness: Melissa Bartlett is a 68 y.o. female with difficult to control hypertension on multidrug regimen. 120's-137 at home. Prior lab work showed elevated creatinine of 1.72. She was on both furosemide as well as hydrochlorothiazide. On May 11, 2013 I decided to discontinue the furosemide. Repeat lab work reassuring 1.28. K was 4.2 as well. No shortness of breath, no syncope, no chest pain. Occasional swelling. She was told to increase her fluid intake In regards to her tobacco use, we discussed a cessation plan. She has a quit date in mid September 2014 but this did not work. Overall she is feeling well. Her daughter unfortunately has pseudotumor cerebri and underwent shunt procedure and had difficulty with motor weakness, difficulty speaking. Increased stress with family.    Wt Readings from Last 3 Encounters:  03/07/14 227 lb (102.967 kg)  08/20/11 242 lb 3.2 oz (109.861 kg)     No past medical history on file.  No past surgical history on file.  Current Outpatient Prescriptions  Medication Sig Dispense Refill  . amLODipine (NORVASC) 10 MG tablet Take 10 mg by mouth daily.        Marland Kitchen aspirin 81 MG tablet Take 81 mg by mouth daily.        . brinzolamide (AZOPT) 1 % ophthalmic suspension Place 1 drop into the right eye 2 (two) times daily.      . carvedilol (COREG) 25 MG tablet Take 25 mg by mouth 2 (two) times daily with a meal.      . COMBIGAN 0.2-0.5 % ophthalmic solution 1 drop twice daily both eyes      . cyclobenzaprine (FLEXERIL) 10 MG tablet Take 10 mg by mouth 3 (three) times daily as needed.        . fluticasone (FLONASE) 50 MCG/ACT nasal spray Place 2 sprays into the nose daily as needed.        Marland Kitchen losartan-hydrochlorothiazide (HYZAAR)  100-25 MG per tablet TAKE 1 TABLET BY MOUTH EVERY DAY  90 tablet  1  . Multiple Vitamins-Minerals (MULTI VITAMIN/MINERALS PO) Take 1 tablet by mouth daily.        . potassium chloride SA (K-DUR,KLOR-CON) 20 MEQ tablet Take 20 mEq by mouth daily.        . TraMADol HCl 50 MG TBDP Take 50 mg by mouth as needed.         No current facility-administered medications for this visit.    Allergies:    Allergies  Allergen Reactions  . Enalapril   . Oxycodone   . Percocet [Oxycodone-Acetaminophen]   . Vicodin [Hydrocodone-Acetaminophen]     Social History:  The patient  reports that she has been smoking.  She does not have any smokeless tobacco history on file.   ROS:  Please see the history of present illness.   Denies any fever, chills, orthopnea, PND  PHYSICAL EXAM: VS:  BP 200/74  Pulse 68  Ht 5\' 2"  (1.575 m)  Wt 227 lb (102.967 kg)  BMI 41.51 kg/m2 Well nourished, well developed, in no acute distress HEENT: normal Neck: no JVD Cardiac:  normal S1, S2; RRR; no murmur Lungs:  clear to auscultation bilaterally, no wheezing, rhonchi or rales Abd: soft, nontender, no hepatomegalyobese  Ext: no edema Skin: warm and dry Neuro: no focal abnormalities noted  EKG:  03/07/14 - NSR no other changes.   ASSESSMENT AND PLAN:  1. Hypertension-on repeat blood pressure was 146/80. Improved. At home she has readings in the 130 range. Continue with current medication.I contemplated addition of spironolactone but I will hold off at this time. 2. Obesity- continue to encourage weight loss. 3. Tobacco use-encourage cessation. She wrote 1-800 number down for [facility] quit program. 4. Diabetes-weight loss will be helpful. Continue with current medications. Per primary.  Signed, Candee Furbish, MD [facility]  03/07/2014 3:47 PM

## 2014-03-07 NOTE — Patient Instructions (Signed)
Your physician recommends that you continue on your current medications as directed. Please refer to the Current Medication list given to you today.  Your physician wants you to follow-up in: 6 months with Dr. Skains. You will receive a reminder letter in the mail two months in advance. If you don't receive a letter, please call our office to schedule the follow-up appointment.  

## 2014-06-09 ENCOUNTER — Other Ambulatory Visit: Payer: Self-pay | Admitting: *Deleted

## 2014-06-09 MED ORDER — LOSARTAN POTASSIUM-HCTZ 100-25 MG PO TABS: ORAL_TABLET | ORAL | Status: DC

## 2014-07-25 DIAGNOSIS — N6009 Solitary cyst of unspecified breast: Secondary | ICD-10-CM | POA: Diagnosis not present

## 2014-07-25 DIAGNOSIS — R928 Other abnormal and inconclusive findings on diagnostic imaging of breast: Secondary | ICD-10-CM | POA: Diagnosis not present

## 2014-08-18 DIAGNOSIS — J209 Acute bronchitis, unspecified: Secondary | ICD-10-CM | POA: Diagnosis not present

## 2014-11-08 DIAGNOSIS — Z23 Encounter for immunization: Secondary | ICD-10-CM | POA: Diagnosis not present

## 2014-11-26 ENCOUNTER — Other Ambulatory Visit: Payer: Self-pay | Admitting: Cardiology

## 2015-01-05 DIAGNOSIS — J209 Acute bronchitis, unspecified: Secondary | ICD-10-CM | POA: Diagnosis not present

## 2015-01-05 DIAGNOSIS — Z1389 Encounter for screening for other disorder: Secondary | ICD-10-CM | POA: Diagnosis not present

## 2015-02-06 ENCOUNTER — Other Ambulatory Visit: Payer: Self-pay | Admitting: Cardiology

## 2015-02-06 MED ORDER — CARVEDILOL 25 MG PO TABS: 25.0000 mg | ORAL_TABLET | Freq: Two times a day (BID) | ORAL | Status: DC

## 2015-02-07 DIAGNOSIS — Z1231 Encounter for screening mammogram for malignant neoplasm of breast: Secondary | ICD-10-CM | POA: Diagnosis not present

## 2015-02-23 ENCOUNTER — Other Ambulatory Visit: Payer: Self-pay | Admitting: Cardiology

## 2015-03-14 ENCOUNTER — Other Ambulatory Visit: Payer: Self-pay

## 2015-03-14 MED ORDER — LOSARTAN POTASSIUM-HCTZ 100-25 MG PO TABS: 1.0000 | ORAL_TABLET | Freq: Every day | ORAL | Status: DC

## 2015-03-27 DIAGNOSIS — E1122 Type 2 diabetes mellitus with diabetic chronic kidney disease: Secondary | ICD-10-CM | POA: Diagnosis not present

## 2015-03-27 DIAGNOSIS — I5030 Unspecified diastolic (congestive) heart failure: Secondary | ICD-10-CM | POA: Diagnosis not present

## 2015-03-27 DIAGNOSIS — J449 Chronic obstructive pulmonary disease, unspecified: Secondary | ICD-10-CM | POA: Diagnosis not present

## 2015-03-27 DIAGNOSIS — Z23 Encounter for immunization: Secondary | ICD-10-CM | POA: Diagnosis not present

## 2015-03-27 DIAGNOSIS — N183 Chronic kidney disease, stage 3 (moderate): Secondary | ICD-10-CM | POA: Diagnosis not present

## 2015-03-27 DIAGNOSIS — Z1389 Encounter for screening for other disorder: Secondary | ICD-10-CM | POA: Diagnosis not present

## 2015-03-27 DIAGNOSIS — Z0000 Encounter for general adult medical examination without abnormal findings: Secondary | ICD-10-CM | POA: Diagnosis not present

## 2015-03-27 DIAGNOSIS — F17210 Nicotine dependence, cigarettes, uncomplicated: Secondary | ICD-10-CM | POA: Diagnosis not present

## 2015-03-27 DIAGNOSIS — I110 Hypertensive heart disease with heart failure: Secondary | ICD-10-CM | POA: Diagnosis not present

## 2015-04-17 DIAGNOSIS — N958 Other specified menopausal and perimenopausal disorders: Secondary | ICD-10-CM | POA: Diagnosis not present

## 2015-04-17 DIAGNOSIS — N951 Menopausal and female climacteric states: Secondary | ICD-10-CM | POA: Diagnosis not present

## 2015-04-30 DIAGNOSIS — M7542 Impingement syndrome of left shoulder: Secondary | ICD-10-CM | POA: Diagnosis not present

## 2015-05-02 ENCOUNTER — Other Ambulatory Visit: Payer: Self-pay

## 2015-05-02 MED ORDER — CARVEDILOL 25 MG PO TABS: 25.0000 mg | ORAL_TABLET | Freq: Two times a day (BID) | ORAL | Status: DC

## 2015-05-02 MED ORDER — AMLODIPINE BESYLATE 10 MG PO TABS: 10.0000 mg | ORAL_TABLET | Freq: Every day | ORAL | Status: DC

## 2015-05-16 DIAGNOSIS — M542 Cervicalgia: Secondary | ICD-10-CM | POA: Diagnosis not present

## 2015-05-16 DIAGNOSIS — M7542 Impingement syndrome of left shoulder: Secondary | ICD-10-CM | POA: Diagnosis not present

## 2015-05-19 DIAGNOSIS — M25512 Pain in left shoulder: Secondary | ICD-10-CM | POA: Diagnosis not present

## 2015-05-23 DIAGNOSIS — M67912 Unspecified disorder of synovium and tendon, left shoulder: Secondary | ICD-10-CM | POA: Diagnosis not present

## 2015-05-30 DIAGNOSIS — M7542 Impingement syndrome of left shoulder: Secondary | ICD-10-CM | POA: Diagnosis not present

## 2015-06-06 DIAGNOSIS — M542 Cervicalgia: Secondary | ICD-10-CM | POA: Diagnosis not present

## 2015-06-06 DIAGNOSIS — M25512 Pain in left shoulder: Secondary | ICD-10-CM | POA: Diagnosis not present

## 2015-06-06 DIAGNOSIS — M7542 Impingement syndrome of left shoulder: Secondary | ICD-10-CM | POA: Diagnosis not present

## 2015-06-11 DIAGNOSIS — M25512 Pain in left shoulder: Secondary | ICD-10-CM | POA: Diagnosis not present

## 2015-06-11 DIAGNOSIS — M7542 Impingement syndrome of left shoulder: Secondary | ICD-10-CM | POA: Diagnosis not present

## 2015-06-11 DIAGNOSIS — M542 Cervicalgia: Secondary | ICD-10-CM | POA: Diagnosis not present

## 2015-06-13 DIAGNOSIS — N390 Urinary tract infection, site not specified: Secondary | ICD-10-CM | POA: Diagnosis not present

## 2015-06-13 DIAGNOSIS — M7542 Impingement syndrome of left shoulder: Secondary | ICD-10-CM | POA: Diagnosis not present

## 2015-06-13 DIAGNOSIS — M25512 Pain in left shoulder: Secondary | ICD-10-CM | POA: Diagnosis not present

## 2015-06-13 DIAGNOSIS — M542 Cervicalgia: Secondary | ICD-10-CM | POA: Diagnosis not present

## 2015-06-18 DIAGNOSIS — M542 Cervicalgia: Secondary | ICD-10-CM | POA: Diagnosis not present

## 2015-06-18 DIAGNOSIS — M7542 Impingement syndrome of left shoulder: Secondary | ICD-10-CM | POA: Diagnosis not present

## 2015-06-18 DIAGNOSIS — M25512 Pain in left shoulder: Secondary | ICD-10-CM | POA: Diagnosis not present

## 2015-06-20 DIAGNOSIS — M25512 Pain in left shoulder: Secondary | ICD-10-CM | POA: Diagnosis not present

## 2015-06-20 DIAGNOSIS — M542 Cervicalgia: Secondary | ICD-10-CM | POA: Diagnosis not present

## 2015-06-20 DIAGNOSIS — M7542 Impingement syndrome of left shoulder: Secondary | ICD-10-CM | POA: Diagnosis not present

## 2015-06-25 DIAGNOSIS — M7542 Impingement syndrome of left shoulder: Secondary | ICD-10-CM | POA: Diagnosis not present

## 2015-06-25 DIAGNOSIS — M542 Cervicalgia: Secondary | ICD-10-CM | POA: Diagnosis not present

## 2015-06-25 DIAGNOSIS — M25512 Pain in left shoulder: Secondary | ICD-10-CM | POA: Diagnosis not present

## 2015-06-27 DIAGNOSIS — M542 Cervicalgia: Secondary | ICD-10-CM | POA: Diagnosis not present

## 2015-06-27 DIAGNOSIS — M7542 Impingement syndrome of left shoulder: Secondary | ICD-10-CM | POA: Diagnosis not present

## 2015-06-27 DIAGNOSIS — M25512 Pain in left shoulder: Secondary | ICD-10-CM | POA: Diagnosis not present

## 2015-07-03 DIAGNOSIS — M7542 Impingement syndrome of left shoulder: Secondary | ICD-10-CM | POA: Diagnosis not present

## 2015-07-03 DIAGNOSIS — M542 Cervicalgia: Secondary | ICD-10-CM | POA: Diagnosis not present

## 2015-07-03 DIAGNOSIS — M25512 Pain in left shoulder: Secondary | ICD-10-CM | POA: Diagnosis not present

## 2015-07-04 DIAGNOSIS — M7542 Impingement syndrome of left shoulder: Secondary | ICD-10-CM | POA: Diagnosis not present

## 2015-07-05 DIAGNOSIS — M25512 Pain in left shoulder: Secondary | ICD-10-CM | POA: Diagnosis not present

## 2015-07-05 DIAGNOSIS — M542 Cervicalgia: Secondary | ICD-10-CM | POA: Diagnosis not present

## 2015-07-05 DIAGNOSIS — M7542 Impingement syndrome of left shoulder: Secondary | ICD-10-CM | POA: Diagnosis not present

## 2015-07-09 ENCOUNTER — Ambulatory Visit (INDEPENDENT_AMBULATORY_CARE_PROVIDER_SITE_OTHER): Payer: Medicare Other | Admitting: Cardiology

## 2015-07-09 ENCOUNTER — Encounter: Payer: Self-pay | Admitting: Cardiology

## 2015-07-09 VITALS — BP 134/76 | HR 58 | Ht 62.0 in | Wt 231.5 lb

## 2015-07-09 DIAGNOSIS — E669 Obesity, unspecified: Secondary | ICD-10-CM | POA: Diagnosis not present

## 2015-07-09 DIAGNOSIS — I10 Essential (primary) hypertension: Secondary | ICD-10-CM

## 2015-07-09 NOTE — Progress Notes (Signed)
[facility]. [address] [place], [place]  [address] Phone: [phone] Fax:  [phone]  Date:  07/09/2015   ID:  LEVIA Bartlett, DOB July 12, 1946, MRN [id number]  PCP:  Lynne Logan, MD   History of Present Illness: Melissa Bartlett is a 69 y.o. female with difficult to control hypertension on multidrug regimen. 120's-137 at home. Prior lab work showed elevated creatinine of 1.72. She was on both furosemide as well as hydrochlorothiazide. On May 11, 2013 I decided to discontinue the furosemide. Repeat lab work reassuring 1.28. K was 4.2 as well. No shortness of breath, no syncope, no chest pain. Occasional swelling. She was told to increase her fluid intake In regards to her tobacco use, we discussed a cessation plan in the past. She still struggles with this.   Overall she is feeling well. Her daughter unfortunately has pseudotumor cerebri and underwent shunt procedure and had difficulty with motor weakness, difficulty speaking.     Wt Readings from Last 3 Encounters:  07/09/15 231 lb 8 oz (105.008 kg)  03/07/14 227 lb (102.967 kg)  08/20/11 242 lb 3.2 oz (109.861 kg)     Past Medical History  Diagnosis Date  . HTN (hypertension)   . Diabetes     Past Surgical History  Procedure Laterality Date  . Knee arthroscopy    . Gallbladder surgery    . Breast biopsy    . Hysterotomy      Patrtial  . Hysterotomy      Complete    Current Outpatient Prescriptions  Medication Sig Dispense Refill  . amLODipine (NORVASC) 10 MG tablet Take 1 tablet (10 mg total) by mouth daily. 30 tablet 2  . aspirin 81 MG tablet Take 81 mg by mouth daily.      . brinzolamide (AZOPT) 1 % ophthalmic suspension Place 1 drop into the right eye 2 (two) times daily.    . carvedilol (COREG) 25 MG tablet Take 1 tablet (25 mg total) by mouth 2 (two) times daily with a meal. 60 tablet 2  . COMBIGAN 0.2-0.5 % ophthalmic solution 1 drop twice daily both eyes    . fluticasone (FLONASE) 50 MCG/ACT  nasal spray Place 2 sprays into the nose daily as needed.      Marland Kitchen losartan-hydrochlorothiazide (HYZAAR) 100-25 MG per tablet Take 1 tablet by mouth daily. 15 tablet 0  . potassium chloride SA (K-DUR,KLOR-CON) 20 MEQ tablet Take 20 mEq by mouth daily.      . TraMADol HCl 50 MG TBDP Take 50 mg by mouth as needed.       No current facility-administered medications for this visit.    Allergies:    Allergies  Allergen Reactions  . Enalapril     Systemic reaction  . Oxycodone Itching  . Percocet [Oxycodone-Acetaminophen] Itching  . Vicodin [Hydrocodone-Acetaminophen] Itching    Social History:  The patient  reports that she has been smoking.  She does not have any smokeless tobacco history on file.   ROS:  Please see the history of present illness.   Denies any fever, chills, orthopnea, PND  PHYSICAL EXAM: VS:  BP 134/76 mmHg  Pulse 58  Ht 5\' 2"  (1.575 m)  Wt 231 lb 8 oz (105.008 kg)  BMI 42.33 kg/m2 Well nourished, well developed, in no acute distress HEENT: normal Neck: no JVD Cardiac:  normal S1, S2; RRR; no murmur Lungs:  clear to auscultation bilaterally, no wheezing, rhonchi or rales Abd: soft, nontender, no  hepatomegalyobese Ext: no edema Skin: warm and dry Neuro: no focal abnormalities noted  EKG:  07/09/15 - SB with PRWP no changes. 03/07/14 - NSR no other changes.   ASSESSMENT AND PLAN:  1. Hypertension- At home she has readings in the 120 range. Continue with current medication. several medications to help control blood pressure. She admits that sometimes she does not remember to take her second carvedilol. 2. Morbid Obesity- continue to encourage weight loss. 3. Tobacco use-encourage cessation. She wrote 1-800 number down for [facility] quit program in the past. 4. A1c 5.6. Excellent.  Signed, Candee Furbish, MD [facility]  07/09/2015 11:17 AM

## 2015-07-09 NOTE — Patient Instructions (Signed)
Medication Instructions:  Your physician recommends that you continue on your current medications as directed. Please refer to the Current Medication list given to you today.  Follow-Up: Follow up in 1 year with Dr. Skains.  You will receive a letter in the mail 2 months before you are due.  Please call us when you receive this letter to schedule your follow up appointment.  Thank you for choosing [facility]!!       

## 2015-07-11 DIAGNOSIS — M7542 Impingement syndrome of left shoulder: Secondary | ICD-10-CM | POA: Diagnosis not present

## 2015-07-11 DIAGNOSIS — M542 Cervicalgia: Secondary | ICD-10-CM | POA: Diagnosis not present

## 2015-07-11 DIAGNOSIS — M25512 Pain in left shoulder: Secondary | ICD-10-CM | POA: Diagnosis not present

## 2015-07-13 DIAGNOSIS — M542 Cervicalgia: Secondary | ICD-10-CM | POA: Diagnosis not present

## 2015-07-13 DIAGNOSIS — M7542 Impingement syndrome of left shoulder: Secondary | ICD-10-CM | POA: Diagnosis not present

## 2015-07-13 DIAGNOSIS — M25512 Pain in left shoulder: Secondary | ICD-10-CM | POA: Diagnosis not present

## 2015-07-17 DIAGNOSIS — M7542 Impingement syndrome of left shoulder: Secondary | ICD-10-CM | POA: Diagnosis not present

## 2015-07-17 DIAGNOSIS — M25512 Pain in left shoulder: Secondary | ICD-10-CM | POA: Diagnosis not present

## 2015-07-17 DIAGNOSIS — M542 Cervicalgia: Secondary | ICD-10-CM | POA: Diagnosis not present

## 2015-07-19 DIAGNOSIS — M7542 Impingement syndrome of left shoulder: Secondary | ICD-10-CM | POA: Diagnosis not present

## 2015-07-19 DIAGNOSIS — M25512 Pain in left shoulder: Secondary | ICD-10-CM | POA: Diagnosis not present

## 2015-07-19 DIAGNOSIS — M542 Cervicalgia: Secondary | ICD-10-CM | POA: Diagnosis not present

## 2015-08-06 ENCOUNTER — Other Ambulatory Visit: Payer: Self-pay | Admitting: Cardiology

## 2015-09-28 DIAGNOSIS — I110 Hypertensive heart disease with heart failure: Secondary | ICD-10-CM | POA: Diagnosis not present

## 2015-09-28 DIAGNOSIS — E1122 Type 2 diabetes mellitus with diabetic chronic kidney disease: Secondary | ICD-10-CM | POA: Diagnosis not present

## 2015-09-28 DIAGNOSIS — N183 Chronic kidney disease, stage 3 (moderate): Secondary | ICD-10-CM | POA: Diagnosis not present

## 2015-09-28 DIAGNOSIS — Z23 Encounter for immunization: Secondary | ICD-10-CM | POA: Diagnosis not present

## 2015-12-17 ENCOUNTER — Other Ambulatory Visit: Payer: Self-pay | Admitting: *Deleted

## 2015-12-17 MED ORDER — AMLODIPINE BESYLATE 10 MG PO TABS: 10.0000 mg | ORAL_TABLET | Freq: Every day | ORAL | Status: DC

## 2016-01-08 DIAGNOSIS — H40033 Anatomical narrow angle, bilateral: Secondary | ICD-10-CM | POA: Diagnosis not present

## 2016-01-08 DIAGNOSIS — H04123 Dry eye syndrome of bilateral lacrimal glands: Secondary | ICD-10-CM | POA: Diagnosis not present

## 2016-01-08 DIAGNOSIS — H25813 Combined forms of age-related cataract, bilateral: Secondary | ICD-10-CM | POA: Diagnosis not present

## 2016-01-11 DIAGNOSIS — H40033 Anatomical narrow angle, bilateral: Secondary | ICD-10-CM | POA: Diagnosis not present

## 2016-01-23 DIAGNOSIS — H40033 Anatomical narrow angle, bilateral: Secondary | ICD-10-CM | POA: Diagnosis not present

## 2016-01-25 DIAGNOSIS — Z87828 Personal history of other (healed) physical injury and trauma: Secondary | ICD-10-CM | POA: Diagnosis not present

## 2016-02-01 DIAGNOSIS — H40033 Anatomical narrow angle, bilateral: Secondary | ICD-10-CM | POA: Diagnosis not present

## 2016-02-11 DIAGNOSIS — H40033 Anatomical narrow angle, bilateral: Secondary | ICD-10-CM | POA: Diagnosis not present

## 2016-03-06 DIAGNOSIS — R05 Cough: Secondary | ICD-10-CM | POA: Diagnosis not present

## 2016-03-06 DIAGNOSIS — R062 Wheezing: Secondary | ICD-10-CM | POA: Diagnosis not present

## 2016-03-10 DIAGNOSIS — Z1231 Encounter for screening mammogram for malignant neoplasm of breast: Secondary | ICD-10-CM | POA: Diagnosis not present

## 2016-04-01 DIAGNOSIS — N183 Chronic kidney disease, stage 3 (moderate): Secondary | ICD-10-CM | POA: Diagnosis not present

## 2016-04-01 DIAGNOSIS — I10 Essential (primary) hypertension: Secondary | ICD-10-CM | POA: Diagnosis not present

## 2016-04-01 DIAGNOSIS — J449 Chronic obstructive pulmonary disease, unspecified: Secondary | ICD-10-CM | POA: Diagnosis not present

## 2016-04-01 DIAGNOSIS — F17210 Nicotine dependence, cigarettes, uncomplicated: Secondary | ICD-10-CM | POA: Diagnosis not present

## 2016-04-01 DIAGNOSIS — Z0000 Encounter for general adult medical examination without abnormal findings: Secondary | ICD-10-CM | POA: Diagnosis not present

## 2016-04-01 DIAGNOSIS — E1122 Type 2 diabetes mellitus with diabetic chronic kidney disease: Secondary | ICD-10-CM | POA: Diagnosis not present

## 2016-04-01 DIAGNOSIS — Z1389 Encounter for screening for other disorder: Secondary | ICD-10-CM | POA: Diagnosis not present

## 2016-04-01 DIAGNOSIS — H409 Unspecified glaucoma: Secondary | ICD-10-CM | POA: Diagnosis not present

## 2016-04-01 DIAGNOSIS — I110 Hypertensive heart disease with heart failure: Secondary | ICD-10-CM | POA: Diagnosis not present

## 2016-04-01 DIAGNOSIS — I5030 Unspecified diastolic (congestive) heart failure: Secondary | ICD-10-CM | POA: Diagnosis not present

## 2016-04-16 DIAGNOSIS — N179 Acute kidney failure, unspecified: Secondary | ICD-10-CM | POA: Diagnosis not present

## 2016-05-09 ENCOUNTER — Other Ambulatory Visit: Payer: Self-pay | Admitting: Cardiology

## 2016-05-20 DIAGNOSIS — E785 Hyperlipidemia, unspecified: Secondary | ICD-10-CM | POA: Diagnosis not present

## 2016-06-19 ENCOUNTER — Ambulatory Visit (INDEPENDENT_AMBULATORY_CARE_PROVIDER_SITE_OTHER): Payer: Medicare Other

## 2016-06-19 ENCOUNTER — Encounter: Payer: Self-pay | Admitting: Podiatry

## 2016-06-19 ENCOUNTER — Ambulatory Visit (INDEPENDENT_AMBULATORY_CARE_PROVIDER_SITE_OTHER): Payer: Medicare Other | Admitting: Podiatry

## 2016-06-19 VITALS — BP 165/89 | HR 62 | Resp 12

## 2016-06-19 DIAGNOSIS — M79671 Pain in right foot: Secondary | ICD-10-CM

## 2016-06-19 DIAGNOSIS — M7989 Other specified soft tissue disorders: Secondary | ICD-10-CM | POA: Diagnosis not present

## 2016-06-19 DIAGNOSIS — Z9889 Other specified postprocedural states: Secondary | ICD-10-CM

## 2016-06-19 DIAGNOSIS — M779 Enthesopathy, unspecified: Secondary | ICD-10-CM

## 2016-06-19 DIAGNOSIS — R600 Localized edema: Secondary | ICD-10-CM

## 2016-06-19 MED ORDER — TRIAMCINOLONE ACETONIDE 10 MG/ML IJ SUSP
10.0000 mg | Freq: Once | INTRAMUSCULAR | Status: AC
  Administered 2016-06-19: 10 mg

## 2016-06-19 NOTE — Progress Notes (Signed)
   Subjective:    Patient ID: Melissa Bartlett, female    DOB: [birth year], 70 y.o.   MRN: [id number]  HPI  Chief Complaint  Patient presents with  . Foot Pain    RT FOOT/ANKLE  IS SWOLLEN/PAINFUL.    RT FOOT IS SWOLLEN/PAINFUL FOR ABOUT 1 MONTH. FOOT IS WORSE WHEN WALKING. TRIED ICY HOT-NO RELIEF. Review of Systems  Cardiovascular: Positive for leg swelling.       Objective:   Physical Exam        Assessment & Plan:

## 2016-06-20 NOTE — Progress Notes (Signed)
Subjective:     Patient ID: Melissa Bartlett, female   DOB: [birth year], 70 y.o.   MRN: [id number]  HPI patient states she had surgery and number of years ago by Dr. Blenda Mounts and is having some swelling on the outside of her right ankle and this is been going on for last month or 2 and does not remember injury   Review of Systems  All other systems reviewed and are negative.      Objective:   Physical Exam  Constitutional: She is oriented to person, place, and time.  Cardiovascular: Intact distal pulses.   Musculoskeletal: Normal range of motion.  Neurological: She is oriented to person, place, and time.  Skin: Skin is warm.  Nursing note and vitals reviewed.  neurovascular status was noted to be intact with significant range of motion diminishment of the subtalar joint secondary to previous surgery on the right foot. Patient's found to have moderate forefoot and ankle edema right with negative Homans sign noted and does have discomfort on the lateral side of the joint and slightly on the medial. It is +1 pitting in the forefoot and ankle at this time. Patient has good digital perfusion and is well oriented 3     Assessment:     Inflammatory changes with no indications of systemic cause with fluid buildup around the medial ankle and lateral ankle with lateral ankle been painful    Plan:     H&P and conditions reviewed with patient. Today I went ahead did a careful lateral injection of the ankle capsule and subtalar joint 3 mg Kenalog 5 mg Xylocaine and then applied Unna boot to provide for compression. I advised on leaving this on for 4 days and if toe should change color or she should develop swelling of the digits to take it off immediately. I then advised on elevation and dispensed surgical shoe and she will recheck again in the next several weeks or earlier if needed  X-ray report indicate screws are in place and stable in place with good alignment and no indications of advanced  arthritic changes

## 2016-07-03 ENCOUNTER — Encounter: Payer: Self-pay | Admitting: Podiatry

## 2016-07-03 ENCOUNTER — Ambulatory Visit (INDEPENDENT_AMBULATORY_CARE_PROVIDER_SITE_OTHER): Payer: Medicare Other | Admitting: Podiatry

## 2016-07-03 DIAGNOSIS — R600 Localized edema: Secondary | ICD-10-CM | POA: Diagnosis not present

## 2016-07-03 DIAGNOSIS — M7989 Other specified soft tissue disorders: Secondary | ICD-10-CM

## 2016-07-03 DIAGNOSIS — M779 Enthesopathy, unspecified: Secondary | ICD-10-CM

## 2016-07-03 NOTE — Progress Notes (Signed)
Subjective:     Patient ID: Melissa Bartlett, female   DOB: 1946-02-19, 70 y.o.   MRN: [id number]  HPI patient presents stating that the pain in her foot has improved quite a bit but she continues to have swelling and her mid foot and her ankle right with pain of a mild to moderate nature versus intense from previous visit   Review of Systems     Objective:   Physical Exam Neurovascular status intact negative Homans sign noted with edema in the right midfoot and ankle with quite a bit of discomfort in the sinus tarsi when palpated deeply but improved from previous visit    Assessment:     Improvement of inflammation of the sinus tarsi right with continued edema in the foot    Plan:     At this point I went ahead and I placed into an anklet to reduce the swelling and instructed on wider shoes and elevation. Patient will be seen back for [place] to recheck

## 2016-07-08 ENCOUNTER — Encounter: Payer: Self-pay | Admitting: Cardiology

## 2016-07-08 ENCOUNTER — Ambulatory Visit (INDEPENDENT_AMBULATORY_CARE_PROVIDER_SITE_OTHER): Payer: Medicare Other | Admitting: Cardiology

## 2016-07-08 VITALS — BP 142/82 | HR 74 | Ht 62.5 in | Wt 232.8 lb

## 2016-07-08 DIAGNOSIS — I480 Paroxysmal atrial fibrillation: Secondary | ICD-10-CM

## 2016-07-08 DIAGNOSIS — I10 Essential (primary) hypertension: Secondary | ICD-10-CM | POA: Diagnosis not present

## 2016-07-08 DIAGNOSIS — E669 Obesity, unspecified: Secondary | ICD-10-CM | POA: Diagnosis not present

## 2016-07-08 MED ORDER — RIVAROXABAN 15 MG PO TABS: 15.0000 mg | ORAL_TABLET | Freq: Every day | ORAL | Status: DC

## 2016-07-08 NOTE — Patient Instructions (Addendum)
Medication Instructions:  Please stop ASA. Start Xarelto 15 mg a day with supper. Continue all other medications as listed.  Follow-Up: Follow up in 3 weeks with Melissa Dopp, PA. See Dr Marlou Porch in 6 months.  If you need a refill on your cardiac medications before your next appointment, please call your pharmacy.  Thank you for choosing [facility]!!      Atrial Fibrillation Atrial fibrillation is a type of heartbeat that is irregular or fast (rapid). If you have this condition, your heart keeps quivering in a weird (chaotic) way. This condition can make it so your heart cannot pump blood normally. Having this condition gives a person more risk for stroke, heart failure, and other heart problems. There are different types of atrial fibrillation. Talk with your doctor to learn about the type that you have. HOME CARE  Take over-the-counter and prescription medicines only as told by your doctor.  If your doctor prescribed a blood-thinning medicine, take it exactly as told. Taking too much of it can cause bleeding. If you do not take enough of it, you will not have the protection that you need against stroke and other problems.  Do not use any tobacco products. These include cigarettes, chewing tobacco, and e-cigarettes. If you need help quitting, ask your doctor.  If you have apnea (obstructive sleep apnea), manage it as told by your doctor.  Do not drink alcohol.  Do not drink beverages that have caffeine. These include coffee, soda, and tea.  Maintain a healthy weight. Do not use diet pills unless your doctor says they are safe for you. Diet pills may make heart problems worse.  Follow diet instructions as told by your doctor.  Exercise regularly as told by your doctor.  Keep all follow-up visits as told by your doctor. This is important. GET HELP IF:  You notice a change in the speed, rhythm, or strength of your heartbeat.  You are taking a blood-thinning medicine  and you notice more bruising.  You get tired more easily when you move or exercise. GET HELP RIGHT AWAY IF:  You have pain in your chest or your belly (abdomen).  You have sweating or weakness.  You feel sick to your stomach (nauseous).  You notice blood in your throw up (vomit), poop (stool), or pee (urine).  You are short of breath.  You suddenly have swollen feet and ankles.  You feel dizzy.  Your suddenly get weak or numb in your face, arms, or legs, especially if it happens on one side of your body.  You have trouble talking, trouble understanding, or both.  Your face or your eyelid droops on one side. These symptoms may be an emergency. Do not wait to see if the symptoms will go away. Get medical help right away. Call your local emergency services (911 in the [place] Do not drive yourself to the hospital.   This information is not intended to replace advice given to you by your health care provider. Make sure you discuss any questions you have with your health care provider.   Document Released: 09/23/2008 Document Revised: 09/05/2015 Document Reviewed: 04/11/2015 Elsevier Interactive Patient Education [employer].

## 2016-07-08 NOTE — Progress Notes (Signed)
[facility]. [address], [address] [place], [place]  [address] Phone: [phone] Fax:  [phone]  Date:  07/08/2016   ID:  Melissa Bartlett, DOB 02-Aug-1946, MRN [id number]  PCP:  Lynne Logan, MD   History of Present Illness: Melissa Bartlett is a 70 y.o. female with difficult to control hypertension on multidrug regimen. 120's-137 at home. Prior lab work showed elevated creatinine of 2.09 in April 2017. No shortness of breath, no syncope, no chest pain. Occasional swelling, seen diet wrist, considering vein specialist..   In regards to her tobacco use, she tried Chantix and was successful over the last few weeks. Had terrible nausea and vomiting with this medication however. Overall she is feeling well. Her daughter unfortunately has pseudotumor cerebri and underwent shunt procedure and had difficulty with motor weakness, difficulty speaking.   Interestingly, on 07/08/16, atrial fibrillation was discovered. Asymptomatic. No history of stroke.   Wt Readings from Last 3 Encounters:  07/08/16 232 lb 12.8 oz (105.597 kg)  07/09/15 231 lb 8 oz (105.008 kg)  03/07/14 227 lb (102.967 kg)     Past Medical History  Diagnosis Date  . HTN (hypertension)   . Diabetes [facility])     Past Surgical History  Procedure Laterality Date  . Knee arthroscopy    . Gallbladder surgery    . Breast biopsy    . Hysterotomy      Patrtial  . Hysterotomy      Complete    Current Outpatient Prescriptions  Medication Sig Dispense Refill  . amLODipine (NORVASC) 10 MG tablet Take 1 tablet (10 mg total) by mouth daily. 30 tablet 5  . brinzolamide (AZOPT) 1 % ophthalmic suspension Place 1 drop into the right eye 3 (three) times daily.     . carvedilol (COREG) 25 MG tablet TAKE 1 TABLET BY MOUTH TWICE A DAY WITH A MEAL 60 tablet 2  . COMBIGAN 0.2-0.5 % ophthalmic solution 1 drop twice daily both eyes    . fluticasone (FLONASE) 50 MCG/ACT nasal spray Place 2 sprays into the nose daily as needed.      .  latanoprost (XALATAN) 0.005 % ophthalmic solution Place 1 drop into the right eye at bedtime.  11  . losartan-hydrochlorothiazide (HYZAAR) 100-25 MG per tablet TAKE 1 TABLET BY MOUTH DAILY. 30 tablet 10  . potassium chloride SA (K-DUR,KLOR-CON) 20 MEQ tablet Take 20 mEq by mouth daily.      . simvastatin (ZOCOR) 20 MG tablet Take 20 mg by mouth every evening.  2  . TraMADol HCl 50 MG TBDP Take 50 mg by mouth as needed.      . Rivaroxaban (XARELTO) 15 MG TABS tablet Take 1 tablet (15 mg total) by mouth daily with supper. 30 tablet 11   No current facility-administered medications for this visit.    Allergies:    Allergies  Allergen Reactions  . Enalapril     Systemic reaction  . Oxycodone Itching  . Percocet [Oxycodone-Acetaminophen] Itching  . Vicodin [Hydrocodone-Acetaminophen] Itching    Social History:  The patient  reports that she quit smoking about 2 months ago. She has never used smokeless tobacco.   ROS:  Please see the history of present illness.   Denies any fever, chills, orthopnea, PND  PHYSICAL EXAM: VS:  BP 142/82 mmHg  Pulse 74  Ht 5' 2.5" (1.588 m)  Wt 232 lb 12.8 oz (105.597 kg)  BMI 41.87 kg/m2 Well nourished, well developed, in no acute distress  HEENT: normal Neck: no JVD Cardiac:  normal S1, S2; irregularly irregular, normal rate; no murmur Lungs:  clear to auscultation bilaterally, no wheezing, rhonchi or rales Abd: soft, nontender, no hepatomegalyobese Ext: no edemaOther than minor right ankle swelling for the past few weeks, wearing ankle support Skin: warm and dry Neuro: no focal abnormalities noted  EKG:  07/08/16 - AFIB 75, NSSTW changes. 07/09/15 - SB with PRWP no changes. 03/07/14 - NSR no other changes.   ASSESSMENT AND PLAN:  1. AFIB - newly discovered on 07/08/16 CHADS-VASc -3 female and HTN, Age (A1c 5.6 on no meds). In 3 weeks, we will have her come back in and see Scott. If she is still in atrial fibrillation we will set her up for  cardioversion at that time. I would like to give her the opportunity at sinus rhythm. She is completely asymptomatic at this time. She did not realize that she was in atrial fibrillation. His factor includes obesity. She is well rate controlled currently. We will stop aspirin. 2. Chronic anticoagulation-we will start Xarelto 15 mg in the evening. Serum creatinine is 2.1 on 04/08/16. GFR is 27. (15-50) We discussed bleeding risks. 3. Stage IV chronic kidney disease-as above. Avoid NSAIDs. 4. Hypertension- At home she has readings in the 120 range. Continue with current medication. several medications to help control blood pressure. She admits that sometimes she does not remember to take her second carvedilol. 5. Morbid Obesity- continue to encourage weight loss. This is contributing to her lower external edema. Her podiatrist is considering sending her to a pain specialist. She likely has a degree of venous insufficiency as well as. Continue with compression. Weight loss. 6. Tobacco use-excellent job with tobacco cessation over the last few weeks. Continue. She tried Chantix. 7. A1c 5.6. Excellent. 8. 3 weeks with Scott, 6 months with me.  Signed, Candee Furbish, MD [facility]  07/08/2016 9:38 AM

## 2016-07-15 ENCOUNTER — Other Ambulatory Visit: Payer: Self-pay | Admitting: Cardiology

## 2016-07-15 MED ORDER — CARVEDILOL 25 MG PO TABS: ORAL_TABLET | ORAL | Status: DC

## 2016-07-15 MED ORDER — POTASSIUM CHLORIDE CRYS ER 20 MEQ PO TBCR: 20.0000 meq | EXTENDED_RELEASE_TABLET | Freq: Every day | ORAL | Status: DC

## 2016-07-15 MED ORDER — LOSARTAN POTASSIUM-HCTZ 100-25 MG PO TABS: 1.0000 | ORAL_TABLET | Freq: Every day | ORAL | Status: DC

## 2016-07-15 MED ORDER — AMLODIPINE BESYLATE 10 MG PO TABS: 10.0000 mg | ORAL_TABLET | Freq: Every day | ORAL | Status: DC

## 2016-07-15 NOTE — Telephone Encounter (Signed)
Rx was sent to pt's pharmacy as requested. Confirmation received.

## 2016-07-29 DIAGNOSIS — H25813 Combined forms of age-related cataract, bilateral: Secondary | ICD-10-CM | POA: Diagnosis not present

## 2016-07-29 DIAGNOSIS — H40033 Anatomical narrow angle, bilateral: Secondary | ICD-10-CM | POA: Diagnosis not present

## 2016-07-30 DIAGNOSIS — R109 Unspecified abdominal pain: Secondary | ICD-10-CM | POA: Diagnosis not present

## 2016-07-30 DIAGNOSIS — I480 Paroxysmal atrial fibrillation: Secondary | ICD-10-CM | POA: Diagnosis not present

## 2016-07-31 DIAGNOSIS — N184 Chronic kidney disease, stage 4 (severe): Secondary | ICD-10-CM | POA: Insufficient documentation

## 2016-07-31 DIAGNOSIS — I480 Paroxysmal atrial fibrillation: Secondary | ICD-10-CM | POA: Insufficient documentation

## 2016-07-31 NOTE — Progress Notes (Signed)
Cardiology Office Note:    Date:  08/01/2016   ID:  Melissa Bartlett, DOB 06/01/1946, MRN [id number]  PCP:  Lynne Logan, MD  Cardiologist:  Dr. Candee Furbish   Electrophysiologist:  n/a  Referring MD: Donald Prose, MD   Chief Complaint  Patient presents with  . Atrial Fibrillation    History of Present Illness:    Melissa Bartlett is a 70 y.o. female with a hx of HTN, HL, CKD, tobacco abuse. She was recently seen by Dr. Candee Furbish 07/08/16 and noted to be in AFib.  HR was controlled.  Recent Creatinine in 4/17 ws 2.1.  Creatinine Clearance is 42.55 mL/min.  CHADS2-VASc=3 (HTN, age > 61, female).  She was placed on Xarelto 15 mg QD. She is brought back with an eye towards DCCV.  She is here alone.  She has remained on Xarelto since it was started.  She denies any bleeding issues.  She denies chest pain, dyspnea, syncope, orthopnea, PND.  She has chronic R ankle edema from an old surgery. She has noted some orthostatic intolerance in the past. She notes one episode of rapid palpitations, but none since.  She denies any bleeding issues.      Prior CV studies that were reviewed today include:    Echo 7/12 EF 60-65%, no RWMA, Gr 1 DD, mild AI  Past Medical History:  Diagnosis Date  . Diabetes ([place])   . HTN (hypertension)     Past Surgical History:  Procedure Laterality Date  . BREAST BIOPSY    . GALLBLADDER SURGERY    . HYSTEROTOMY     Patrtial  . HYSTEROTOMY     Complete  . KNEE ARTHROSCOPY      Current Medications: Outpatient Medications Prior to Visit  Medication Sig Dispense Refill  . amLODipine (NORVASC) 10 MG tablet Take 1 tablet (10 mg total) by mouth daily. 90 tablet 3  . brinzolamide (AZOPT) 1 % ophthalmic suspension Place 1 drop into the right eye 3 (three) times daily.     . carvedilol (COREG) 25 MG tablet TAKE 1 TABLET BY MOUTH TWICE A DAY WITH A MEAL 180 tablet 3  . COMBIGAN 0.2-0.5 % ophthalmic solution 1 drop twice daily both eyes    . fluticasone (FLONASE)  50 MCG/ACT nasal spray Place 2 sprays into the nose daily as needed.      . latanoprost (XALATAN) 0.005 % ophthalmic solution Place 1 drop into the right eye at bedtime.  11  . losartan-hydrochlorothiazide (HYZAAR) 100-25 MG tablet Take 1 tablet by mouth daily. 90 tablet 3  . potassium chloride SA (K-DUR,KLOR-CON) 20 MEQ tablet Take 1 tablet (20 mEq total) by mouth daily. 90 tablet 3  . Rivaroxaban (XARELTO) 15 MG TABS tablet Take 1 tablet (15 mg total) by mouth daily with supper. 30 tablet 11  . simvastatin (ZOCOR) 20 MG tablet Take 20 mg by mouth every evening.  2  . TraMADol HCl 50 MG TBDP Take 50 mg by mouth as needed.       No facility-administered medications prior to visit.       Allergies:   Enalapril; Oxycodone; Percocet [oxycodone-acetaminophen]; and Vicodin [hydrocodone-acetaminophen]   Social History   Social History  . Marital status: Married    Spouse name: N/A  . Number of children: N/A  . Years of education: N/A   Social History Main Topics  . Smoking status: Former Smoker    Quit date: 04/08/2016  . Smokeless tobacco: Never Used  .  Alcohol use None  . Drug use: Unknown  . Sexual activity: Not Asked   Other Topics Concern  . None   Social History Narrative  . None     Family History:  The patient's family history includes Cancer in her father; Hypertension in her mother.   ROS:   Please see the history of present illness.    ROS All other systems reviewed and are negative.   EKGs/Labs/Other Test Reviewed:    EKG:  EKG is  ordered today.  The ekg ordered today demonstrates AFib, HR 74, low voltage, septal Q waves, QTc 428 ms, no sig change since prior tracing.  Recent Labs: No results found for requested labs within last 8760 hours.   Recent Lipid Panel    Component Value Date/Time   CHOL 150 07/06/2011 0216   TRIG 123 07/06/2011 0216   HDL 46 07/06/2011 0216   CHOLHDL 3.3 07/06/2011 0216   VLDL 25 07/06/2011 0216   LDLCALC 79 07/06/2011 0216      Physical Exam:    VS:  BP (!) 150/80   Pulse 74   Ht 5' 2.5" (1.588 m)   Wt 235 lb 1.9 oz (106.6 kg)   BMI 42.32 kg/m     Wt Readings from Last 3 Encounters:  08/01/16 235 lb 1.9 oz (106.6 kg)  07/08/16 232 lb 12.8 oz (105.6 kg)  07/09/15 231 lb 8 oz (105 kg)     Physical Exam  Constitutional: She is oriented to person, place, and time. She appears well-developed and well-nourished.  HENT:  Head: Normocephalic and atraumatic.  Eyes: No scleral icterus.  Neck: Normal range of motion. No JVD present.  Cardiovascular: Normal rate, S1 normal and S2 normal.  An irregularly irregular rhythm present. Exam reveals no gallop and no friction rub.   No murmur heard. Pulmonary/Chest: Breath sounds normal. She has no wheezes. She has no rhonchi. She has no rales.  Abdominal: Soft. There is no tenderness.  Musculoskeletal: She exhibits edema.  R ankle trace -1+ edema  Neurological: She is alert and oriented to person, place, and time.  Skin: Skin is warm and dry.  Psychiatric: She has a normal mood and affect.    ASSESSMENT:    1. Paroxysmal atrial fibrillation (HCC)   2. Essential hypertension   3. CKD (chronic kidney disease) stage 3, GFR 30-59 ml/min   4. Morbid obesity, unspecified obesity type ([place])    PLAN:    In order of problems listed above:  1. PAF - She returns today for follow-up on newly diagnosed atrial fibrillation. She remains in atrial fibrillation with controlled ventricular rate. According to her creatinine clearance of 42.55, she should be on Xarelto 15 mg daily. She has been on this medication since she last saw Dr. Marlou Porch. She has not missed a dose. It is unclear if she is symptomatic or not with atrial fibrillation. I discussed the risks and benefits of proceeding with cardioversion with her today. She agrees to proceed. This will be set up with Dr. Marlou Porch in the next 2 weeks. Labs will be obtained to include BMET, CBC. Follow-up with Dr. Marlou Porch or me 1-2  weeks after cardioversion. If she remains in atrial fibrillation, we may need to consider an echocardiogram to assess left atrial size. We may also need to consider sleep testing to assess for sleep apnea.  2. Hypertension - Blood pressure remains uncontrolled. Continue current therapy. Consider addition of hydralazine at follow-up.  3. CKD - Repeat BMET today.  4. Morbid obesity - As noted, we may need to consider sleep study to assess for sleep apnea.  Medication Adjustments/Labs and Tests Ordered: Current medicines are reviewed at length with the patient today.  Concerns regarding medicines are outlined above.  Medication changes, Labs and Tests ordered today are outlined in the Patient Instructions noted below. Patient Instructions  Medication Instructions:  No changes.  See your medication list. Labwork: Today - BMET, CBC Testing/Procedures: 1. Schedule a Cardioversion for Atrial Fibrillation with Dr. Candee Furbish in the next 1-2 weeks (Mondays, Tuesdays preferred. Your physician has recommended that you have a Cardioversion (DCCV). Electrical Cardioversion uses a jolt of electricity to your heart either through paddles or wired patches attached to your chest. This is a controlled, usually prescheduled, procedure. Defibrillation is done under light anesthesia in the hospital, and you usually go home the day of the procedure. This is done to get your heart back into a normal rhythm. You are not awake for the procedure. Please see the instruction sheet given to you today. Follow-Up: Dr. Candee Furbish or Richardson Dopp, PA-C 1-2 weeks after the Cardioversion  Any Other Special Instructions Will Be Listed Below (If Applicable). If you need a refill on your cardiac medications before your next appointment, please call your pharmacy.   Signed, Richardson Dopp, PA-C  08/01/2016 [facility] [address], [place], [place]  [address] Phone: [phone]; Fax: [phone]

## 2016-08-01 ENCOUNTER — Encounter: Payer: Self-pay | Admitting: Physician Assistant

## 2016-08-01 ENCOUNTER — Ambulatory Visit (INDEPENDENT_AMBULATORY_CARE_PROVIDER_SITE_OTHER): Payer: Medicare Other | Admitting: Physician Assistant

## 2016-08-01 VITALS — BP 150/80 | HR 74 | Ht 62.5 in | Wt 235.1 lb

## 2016-08-01 DIAGNOSIS — I10 Essential (primary) hypertension: Secondary | ICD-10-CM

## 2016-08-01 DIAGNOSIS — N183 Chronic kidney disease, stage 3 unspecified: Secondary | ICD-10-CM

## 2016-08-01 DIAGNOSIS — I480 Paroxysmal atrial fibrillation: Secondary | ICD-10-CM | POA: Diagnosis not present

## 2016-08-01 LAB — BASIC METABOLIC PANEL WITH GFR
BUN: 33 mg/dL — ABNORMAL HIGH (ref 7–25)
CO2: 24 mmol/L (ref 20–31)
Calcium: 8.4 mg/dL — ABNORMAL LOW (ref 8.6–10.4)
Chloride: 105 mmol/L (ref 98–110)
Creat: 2.22 mg/dL — ABNORMAL HIGH (ref 0.50–0.99)
Glucose, Bld: 100 mg/dL — ABNORMAL HIGH (ref 65–99)
Potassium: 4.4 mmol/L (ref 3.5–5.3)
Sodium: 141 mmol/L (ref 135–146)

## 2016-08-01 LAB — CBC WITH DIFFERENTIAL/PLATELET
BASOS ABS: 0 {cells}/uL (ref 0–200)
BASOS PCT: 0 %
EOS PCT: 3 %
Eosinophils Absolute: 264 cells/uL (ref 15–500)
HCT: 36.6 % (ref 35.0–45.0)
HEMOGLOBIN: 11.1 g/dL — AB (ref 11.7–15.5)
LYMPHS ABS: 3520 {cells}/uL (ref 850–3900)
Lymphocytes Relative: 40 %
MCH: 26 pg — AB (ref 27.0–33.0)
MCHC: 30.3 g/dL — ABNORMAL LOW (ref 32.0–36.0)
MCV: 85.7 fL (ref 80.0–100.0)
MONOS PCT: 9 %
MPV: 10.2 fL (ref 7.5–12.5)
Monocytes Absolute: 792 cells/uL (ref 200–950)
NEUTROS ABS: 4224 {cells}/uL (ref 1500–7800)
Neutrophils Relative %: 48 %
PLATELETS: 274 10*3/uL (ref 140–400)
RBC: 4.27 MIL/uL (ref 3.80–5.10)
RDW: 15 % (ref 11.0–15.0)
WBC: 8.8 10*3/uL (ref 3.8–10.8)

## 2016-08-01 NOTE — Patient Instructions (Addendum)
Medication Instructions:  No changes.  See your medication list. Labwork: Today - BMET, CBC Testing/Procedures: 1. Schedule a Cardioversion for Atrial Fibrillation with Dr. Candee Furbish in the next 1-2 weeks (Mondays, Tuesdays preferred. Your physician has recommended that you have a Cardioversion (DCCV). Electrical Cardioversion uses a jolt of electricity to your heart either through paddles or wired patches attached to your chest. This is a controlled, usually prescheduled, procedure. Defibrillation is done under light anesthesia in the hospital, and you usually go home the day of the procedure. This is done to get your heart back into a normal rhythm. You are not awake for the procedure. Please see the instruction sheet given to you today. Follow-Up: Dr. Candee Furbish or Richardson Dopp, PA-C 1-2 weeks after the Cardioversion  Any Other Special Instructions Will Be Listed Below (If Applicable). If you need a refill on your cardiac medications before your next appointment, please call your pharmacy.    Electrical Cardioversion Electrical cardioversion is the delivery of a jolt of electricity to change the rhythm of the heart. Sticky patches or metal paddles are placed on the chest to deliver the electricity from a device. This is done to restore a normal rhythm. A rhythm that is too fast or not regular keeps the heart from pumping well. Electrical cardioversion is done in an emergency if:   There is low or no blood pressure as a result of the heart rhythm.   Normal rhythm must be restored as fast as possible to protect the brain and heart from further damage.   It may save a life. Cardioversion may be done for heart rhythms that are not immediately life threatening, such as atrial fibrillation or flutter, in which:   The heart is beating too fast or is not regular.   Medicine to change the rhythm has not worked.   It is safe to wait in order to allow time for preparation.  Symptoms of  the abnormal rhythm are bothersome.  The risk of stroke and other serious problems can be reduced. LET [facility] CARE PROVIDER KNOW ABOUT:   Any allergies you have.  All medicines you are taking, including vitamins, herbs, eye drops, creams, and over-the-counter medicines.  Previous problems you or members of your family have had with the use of anesthetics.   Any blood disorders you have.   Previous surgeries you have had.   Medical conditions you have. RISKS AND COMPLICATIONS  Generally, this is a safe procedure. However, problems can occur and include:   Breathing problems related to the anesthetic used.  A blood clot that breaks free and travels to other parts of your body. This could cause a stroke or other problems. The risk of this is lowered by use of blood-thinning medicine (anticoagulant) prior to the procedure.  Cardiac arrest (rare). BEFORE THE PROCEDURE   You may have tests to detect blood clots in your heart and to evaluate heart function.  You may start taking anticoagulants so your blood does not clot as easily.   Medicines may be given to help stabilize your heart rate and rhythm. PROCEDURE  You will be given medicine through an IV tube to reduce discomfort and make you sleepy (sedative).   An electrical shock will be delivered. AFTER THE PROCEDURE Your heart rhythm will be watched to make sure it does not change.    This information is not intended to replace advice given to you by your health care provider. Make sure you discuss any questions  you have with your health care provider.   Document Released: 12/05/2002 Document Revised: 01/05/2015 Document Reviewed: 06/29/2013 Elsevier Interactive Patient Education [employer].

## 2016-08-04 ENCOUNTER — Telehealth: Payer: Self-pay | Admitting: *Deleted

## 2016-08-04 NOTE — Telephone Encounter (Signed)
Pt notified of lab results by phone with verbal understanding.  

## 2016-08-05 DIAGNOSIS — H4053X1 Glaucoma secondary to other eye disorders, bilateral, mild stage: Secondary | ICD-10-CM | POA: Diagnosis not present

## 2016-08-15 ENCOUNTER — Encounter (HOSPITAL_COMMUNITY): Payer: Self-pay | Admitting: *Deleted

## 2016-08-15 ENCOUNTER — Ambulatory Visit (HOSPITAL_COMMUNITY): Payer: Medicare Other | Admitting: Anesthesiology

## 2016-08-15 ENCOUNTER — Ambulatory Visit (HOSPITAL_COMMUNITY)
Admission: RE | Admit: 2016-08-15 | Discharge: 2016-08-15 | Disposition: A | Discharge status: Home / Self Care | Payer: Medicare Other | Source: Ambulatory Visit | Attending: Cardiology | Admitting: Cardiology

## 2016-08-15 ENCOUNTER — Encounter (HOSPITAL_COMMUNITY)
Admission: RE | Disposition: A | Discharge status: Home / Self Care | Payer: Self-pay | Source: Ambulatory Visit | Attending: Cardiology

## 2016-08-15 DIAGNOSIS — I4891 Unspecified atrial fibrillation: Secondary | ICD-10-CM | POA: Diagnosis not present

## 2016-08-15 DIAGNOSIS — N184 Chronic kidney disease, stage 4 (severe): Secondary | ICD-10-CM | POA: Insufficient documentation

## 2016-08-15 DIAGNOSIS — E119 Type 2 diabetes mellitus without complications: Secondary | ICD-10-CM | POA: Diagnosis not present

## 2016-08-15 DIAGNOSIS — E1122 Type 2 diabetes mellitus with diabetic chronic kidney disease: Secondary | ICD-10-CM | POA: Diagnosis not present

## 2016-08-15 DIAGNOSIS — I129 Hypertensive chronic kidney disease with stage 1 through stage 4 chronic kidney disease, or unspecified chronic kidney disease: Secondary | ICD-10-CM | POA: Diagnosis not present

## 2016-08-15 DIAGNOSIS — N189 Chronic kidney disease, unspecified: Secondary | ICD-10-CM | POA: Diagnosis not present

## 2016-08-15 DIAGNOSIS — Z87891 Personal history of nicotine dependence: Secondary | ICD-10-CM | POA: Insufficient documentation

## 2016-08-15 DIAGNOSIS — I480 Paroxysmal atrial fibrillation: Secondary | ICD-10-CM

## 2016-08-15 HISTORY — PX: CARDIOVERSION: SHX1299

## 2016-08-15 SURGERY — CARDIOVERSION
Anesthesia: Monitor Anesthesia Care

## 2016-08-15 MED ORDER — SODIUM CHLORIDE 0.9% FLUSH: 3.0000 mL | INTRAVENOUS | Status: DC | PRN

## 2016-08-15 MED ORDER — SODIUM CHLORIDE 0.9% FLUSH: 3.0000 mL | Freq: Two times a day (BID) | INTRAVENOUS | Status: DC

## 2016-08-15 MED ORDER — HYDROCORTISONE 1 % EX CREA: 1.0000 "application " | TOPICAL_CREAM | Freq: Three times a day (TID) | CUTANEOUS | Status: DC | PRN

## 2016-08-15 MED ORDER — SODIUM CHLORIDE 0.9 % IV SOLN
INTRAVENOUS | Status: DC | PRN
  Administered 2016-08-15: 500 mL
  Administered 2016-08-15: 08:00:00 via INTRAVENOUS

## 2016-08-15 MED ORDER — SODIUM CHLORIDE 0.9 % IV SOLN: 250.0000 mL | INTRAVENOUS | Status: DC

## 2016-08-15 MED ORDER — LIDOCAINE HCL (CARDIAC) 20 MG/ML IV SOLN
INTRAVENOUS | Status: DC | PRN
  Administered 2016-08-15: 40 mg via INTRATRACHEAL

## 2016-08-15 MED ORDER — PROPOFOL 10 MG/ML IV BOLUS
INTRAVENOUS | Status: DC | PRN
  Administered 2016-08-15: 50 mg via INTRAVENOUS

## 2016-08-15 NOTE — Anesthesia Preprocedure Evaluation (Addendum)
Anesthesia Evaluation  Patient identified by MRN, date of birth, ID band Patient awake    Reviewed: Allergy & Precautions, NPO status , Patient's Chart, lab work & pertinent test results, reviewed documented beta blocker date and time   Airway Mallampati: II  TM Distance: >3 FB Neck ROM: full    Dental  (+) Dental Advidsory Given, Teeth Intact   Pulmonary former smoker,    breath sounds clear to auscultation       Cardiovascular hypertension, Pt. on medications and Pt. on home beta blockers  Rhythm:Irregular Rate:Abnormal     Neuro/Psych negative neurological ROS  negative psych ROS   GI/Hepatic negative GI ROS, Neg liver ROS,   Endo/Other  diabetes, Type 2  Renal/GU CRFRenal disease  negative genitourinary   Musculoskeletal negative musculoskeletal ROS (+)   Abdominal   Peds negative pediatric ROS (+)  Hematology negative hematology ROS (+)   Anesthesia Other Findings   Reproductive/Obstetrics negative OB ROS                           Lab Results  Component Value Date   CREATININE 2.22 (H) 08/01/2016   BUN 33 (H) 08/01/2016   NA 141 08/01/2016   K 4.4 08/01/2016   CL 105 08/01/2016   CO2 24 08/01/2016    Lab Results  Component Value Date   INR 1.01 07/06/2011     Anesthesia Physical Anesthesia Plan  ASA: III  Anesthesia Plan: MAC   Post-op Pain Management:    Induction: Intravenous  Airway Management Planned: Natural Airway  Additional Equipment:   Intra-op Plan:   Post-operative Plan:   Informed Consent: I have reviewed the patients History and Physical, chart, labs and discussed the procedure including the risks, benefits and alternatives for the proposed anesthesia with the patient or authorized representative who has indicated his/her understanding and acceptance.   Dental Advisory Given  Plan Discussed with: CRNA  Anesthesia Plan Comments:         Anesthesia Quick Evaluation

## 2016-08-15 NOTE — Transfer of Care (Signed)
Immediate Anesthesia Transfer of Care Note  Patient: Melissa Bartlett  Procedure(s) Performed: Procedure(s): CARDIOVERSION (N/A)  Patient Location: Endoscopy Unit  Anesthesia Type:General  Level of Consciousness: awake, alert  and oriented  Airway & Oxygen Therapy: Patient Spontanous Breathing and Patient connected to nasal cannula oxygen  Post-op Assessment: Report given to RN, Post -op Vital signs reviewed and stable and Patient moving all extremities X 4  Post vital signs: Reviewed and stable  Last Vitals:  Vitals:   08/15/16 0820  BP: (!) 173/83  Resp: (!) 22  Temp: 36.8 C    Last Pain:  Vitals:   08/15/16 0820  TempSrc: Oral         Complications: No apparent anesthesia complications

## 2016-08-15 NOTE — Anesthesia Postprocedure Evaluation (Signed)
Anesthesia Post Note  Patient: Melissa Bartlett  Procedure(s) Performed: Procedure(s) (LRB): CARDIOVERSION (N/A)  Patient location during evaluation: PACU Anesthesia Type: MAC Level of consciousness: awake and alert Pain management: pain level controlled Vital Signs Assessment: post-procedure vital signs reviewed and stable Respiratory status: spontaneous breathing, nonlabored ventilation, respiratory function stable and patient connected to nasal cannula oxygen Cardiovascular status: stable and blood pressure returned to baseline Anesthetic complications: no    Last Vitals:  Vitals:   08/15/16 0937 08/15/16 0946  BP: (!) 117/98 (!) 144/60  Pulse: (!) 56 (!) 54  Resp: 19 19  Temp:      Last Pain:  Vitals:   08/15/16 0820  TempSrc: Oral                 [name]

## 2016-08-15 NOTE — Interval H&P Note (Signed)
History and Physical Interval Note:  08/15/2016 8:57 AM  Melissa Bartlett  has presented today for surgery, with the diagnosis of AFIB  The various methods of treatment have been discussed with the patient and family. After consideration of risks, benefits and other options for treatment, the patient has consented to  Procedure(s): CARDIOVERSION (N/A) as a surgical intervention .  The patient's history has been reviewed, patient examined, no change in status, stable for surgery.  I have reviewed the patient's chart and labs.  Questions were answered to the patient's satisfaction.     [employer]

## 2016-08-15 NOTE — CV Procedure (Signed)
    Electrical Cardioversion Procedure Note VAUNDA RAPOPORT [id number] 02/04/1946  Procedure: Electrical Cardioversion Indications:  Atrial Fibrillation  Time Out: Verified patient identification, verified procedure,medications/allergies/relevent history reviewed, required imaging and test results available.  Performed  Procedure Details  The patient was NPO after midnight. Anesthesia was administered at the beside  by anesthesia with propofol.  Cardioversion was performed with synchronized biphasic defibrillation via AP pads with 120 joules.  1 attempt(s) were performed.  The patient converted to normal sinus rhythm. The patient tolerated the procedure well   IMPRESSION:  Successful cardioversion of atrial fibrillation.    Candee Furbish 08/15/2016, 9:13 AM

## 2016-08-15 NOTE — H&P (View-Only) (Signed)
Cardiology Office Note:    Date:  08/01/2016   ID:  Melissa Bartlett, DOB 05/28/1946, MRN [id number]  PCP:  Lynne Logan, MD  Cardiologist:  Dr. Candee Furbish   Electrophysiologist:  n/a  Referring MD: Melissa Prose, MD   Chief Complaint  Patient presents with  . Atrial Fibrillation    History of Present Illness:    Melissa Bartlett is a 70 y.o. female with a hx of HTN, HL, CKD, tobacco abuse. She was recently seen by Dr. Candee Furbish 07/08/16 and noted to be in AFib.  HR was controlled.  Recent Creatinine in 4/17 ws 2.1.  Creatinine Clearance is 42.55 mL/min.  CHADS2-VASc=3 (HTN, age > 23, female).  She was placed on Xarelto 15 mg QD. She is brought back with an eye towards DCCV.  She is here alone.  She has remained on Xarelto since it was started.  She denies any bleeding issues.  She denies chest pain, dyspnea, syncope, orthopnea, PND.  She has chronic R ankle edema from an old surgery. She has noted some orthostatic intolerance in the past. She notes one episode of rapid palpitations, but none since.  She denies any bleeding issues.      Prior CV studies that were reviewed today include:    Echo 7/12 EF 60-65%, no RWMA, Gr 1 DD, mild AI  Past Medical History:  Diagnosis Date  . Diabetes ([place])   . HTN (hypertension)     Past Surgical History:  Procedure Laterality Date  . BREAST BIOPSY    . GALLBLADDER SURGERY    . HYSTEROTOMY     Patrtial  . HYSTEROTOMY     Complete  . KNEE ARTHROSCOPY      Current Medications: Outpatient Medications Prior to Visit  Medication Sig Dispense Refill  . amLODipine (NORVASC) 10 MG tablet Take 1 tablet (10 mg total) by mouth daily. 90 tablet 3  . brinzolamide (AZOPT) 1 % ophthalmic suspension Place 1 drop into the right eye 3 (three) times daily.     . carvedilol (COREG) 25 MG tablet TAKE 1 TABLET BY MOUTH TWICE A DAY WITH A MEAL 180 tablet 3  . COMBIGAN 0.2-0.5 % ophthalmic solution 1 drop twice daily both eyes    . fluticasone (FLONASE)  50 MCG/ACT nasal spray Place 2 sprays into the nose daily as needed.      . latanoprost (XALATAN) 0.005 % ophthalmic solution Place 1 drop into the right eye at bedtime.  11  . losartan-hydrochlorothiazide (HYZAAR) 100-25 MG tablet Take 1 tablet by mouth daily. 90 tablet 3  . potassium chloride SA (K-DUR,KLOR-CON) 20 MEQ tablet Take 1 tablet (20 mEq total) by mouth daily. 90 tablet 3  . Rivaroxaban (XARELTO) 15 MG TABS tablet Take 1 tablet (15 mg total) by mouth daily with supper. 30 tablet 11  . simvastatin (ZOCOR) 20 MG tablet Take 20 mg by mouth every evening.  2  . TraMADol HCl 50 MG TBDP Take 50 mg by mouth as needed.       No facility-administered medications prior to visit.       Allergies:   Enalapril; Oxycodone; Percocet [oxycodone-acetaminophen]; and Vicodin [hydrocodone-acetaminophen]   Social History   Social History  . Marital status: Married    Spouse name: N/A  . Number of children: N/A  . Years of education: N/A   Social History Main Topics  . Smoking status: Former Smoker    Quit date: 04/08/2016  . Smokeless tobacco: Never Used  .  Alcohol use None  . Drug use: Unknown  . Sexual activity: Not Asked   Other Topics Concern  . None   Social History Narrative  . None     Family History:  The patient's family history includes Cancer in her father; Hypertension in her mother.   ROS:   Please see the history of present illness.    ROS All other systems reviewed and are negative.   EKGs/Labs/Other Test Reviewed:    EKG:  EKG is  ordered today.  The ekg ordered today demonstrates AFib, HR 74, low voltage, septal Q waves, QTc 428 ms, no sig change since prior tracing.  Recent Labs: No results found for requested labs within last 8760 hours.   Recent Lipid Panel    Component Value Date/Time   CHOL 150 07/06/2011 0216   TRIG 123 07/06/2011 0216   HDL 46 07/06/2011 0216   CHOLHDL 3.3 07/06/2011 0216   VLDL 25 07/06/2011 0216   LDLCALC 79 07/06/2011 0216      Physical Exam:    VS:  BP (!) 150/80   Pulse 74   Ht 5' 2.5" (1.588 m)   Wt 235 lb 1.9 oz (106.6 kg)   BMI 42.32 kg/m     Wt Readings from Last 3 Encounters:  08/01/16 235 lb 1.9 oz (106.6 kg)  07/08/16 232 lb 12.8 oz (105.6 kg)  07/09/15 231 lb 8 oz (105 kg)     Physical Exam  Constitutional: She is oriented to person, place, and time. She appears well-developed and well-nourished.  HENT:  Head: Normocephalic and atraumatic.  Eyes: No scleral icterus.  Neck: Normal range of motion. No JVD present.  Cardiovascular: Normal rate, S1 normal and S2 normal.  An irregularly irregular rhythm present. Exam reveals no gallop and no friction rub.   No murmur heard. Pulmonary/Chest: Breath sounds normal. She has no wheezes. She has no rhonchi. She has no rales.  Abdominal: Soft. There is no tenderness.  Musculoskeletal: She exhibits edema.  R ankle trace -1+ edema  Neurological: She is alert and oriented to person, place, and time.  Skin: Skin is warm and dry.  Psychiatric: She has a normal mood and affect.    ASSESSMENT:    1. Paroxysmal atrial fibrillation (HCC)   2. Essential hypertension   3. CKD (chronic kidney disease) stage 3, GFR 30-59 ml/min   4. Morbid obesity, unspecified obesity type ([place])    PLAN:    In order of problems listed above:  1. PAF - She returns today for follow-up on newly diagnosed atrial fibrillation. She remains in atrial fibrillation with controlled ventricular rate. According to her creatinine clearance of 42.55, she should be on Xarelto 15 mg daily. She has been on this medication since she last saw Dr. Marlou Porch. She has not missed a dose. It is unclear if she is symptomatic or not with atrial fibrillation. I discussed the risks and benefits of proceeding with cardioversion with her today. She agrees to proceed. This will be set up with Dr. Marlou Porch in the next 2 weeks. Labs will be obtained to include BMET, CBC. Follow-up with Dr. Marlou Porch or me 1-2  weeks after cardioversion. If she remains in atrial fibrillation, we may need to consider an echocardiogram to assess left atrial size. We may also need to consider sleep testing to assess for sleep apnea.  2. Hypertension - Blood pressure remains uncontrolled. Continue current therapy. Consider addition of hydralazine at follow-up.  3. CKD - Repeat BMET today.  4. Morbid obesity - As noted, we may need to consider sleep study to assess for sleep apnea.  Medication Adjustments/Labs and Tests Ordered: Current medicines are reviewed at length with the patient today.  Concerns regarding medicines are outlined above.  Medication changes, Labs and Tests ordered today are outlined in the Patient Instructions noted below. Patient Instructions  Medication Instructions:  No changes.  See your medication list. Labwork: Today - BMET, CBC Testing/Procedures: 1. Schedule a Cardioversion for Atrial Fibrillation with Dr. Candee Furbish in the next 1-2 weeks (Mondays, Tuesdays preferred. Your physician has recommended that you have a Cardioversion (DCCV). Electrical Cardioversion uses a jolt of electricity to your heart either through paddles or wired patches attached to your chest. This is a controlled, usually prescheduled, procedure. Defibrillation is done under light anesthesia in the hospital, and you usually go home the day of the procedure. This is done to get your heart back into a normal rhythm. You are not awake for the procedure. Please see the instruction sheet given to you today. Follow-Up: Dr. Candee Furbish or Richardson Dopp, PA-C 1-2 weeks after the Cardioversion  Any Other Special Instructions Will Be Listed Below (If Applicable). If you need a refill on your cardiac medications before your next appointment, please call your pharmacy.   Signed, Richardson Dopp, PA-C  08/01/2016 [facility] [address], [place], [place]  [address] Phone: [phone]; Fax: [phone]

## 2016-08-15 NOTE — Interval H&P Note (Signed)
History and Physical Interval Note:  08/15/2016 8:02 AM  Melissa Bartlett  has presented today for surgery, with the diagnosis of AFIB  The various methods of treatment have been discussed with the patient and family. After consideration of risks, benefits and other options for treatment, the patient has consented to  Procedure(s): CARDIOVERSION (N/A) as a surgical intervention .  The patient's history has been reviewed, patient examined, no change in status, stable for surgery.  I have reviewed the patient's chart and labs.  Questions were answered to the patient's satisfaction.     [employer]

## 2016-08-17 ENCOUNTER — Encounter (HOSPITAL_COMMUNITY): Payer: Self-pay | Admitting: Cardiology

## 2016-08-22 ENCOUNTER — Encounter: Payer: Self-pay | Admitting: Physician Assistant

## 2016-08-31 ENCOUNTER — Other Ambulatory Visit: Payer: Self-pay | Admitting: Cardiology

## 2016-09-07 NOTE — Progress Notes (Signed)
Cardiology Office Note:    Date:  09/08/2016   ID:  Melissa Bartlett, DOB [birth year], MRN [id number]  PCP:  Lynne Logan, MD  Cardiologist:  Dr. Candee Furbish   Electrophysiologist:  n/a  Referring MD: Donald Prose, MD   Chief Complaint  Patient presents with  . Follow-up    AFib, s/p DCCV    History of Present Illness:    Melissa Bartlett is a 70 y.o. female with a hx of PAF, HTN, HL, CKD, tobacco abuse. In 7/17 she was noted to be in AFib.  HR was controlled.   CHADS2-VASc=3 (HTN, age > 68, female).  She was placed on Xarelto 15 mg QD.  I saw her last month and set up DCCV.  She underwent the procedure 08/15/16.  She had no change in her symptoms with NSR.  She remains in NSR today.  She denies chest pain, dyspnea, syncope, orthopnea, PND, edema.  She denies bleeding issues.  She did have one episode of palpitations.  She needs to hold Xarelto for eye surgery with Dr. Arlina Robes with [facility].  Date of surgery is 09/15/16.       Prior CV studies that were reviewed today include:    Echo 7/12 EF 60-65%, no RWMA, Gr 1 DD, mild AI   Past Medical History:  Diagnosis Date  . Diabetes ([place])   . HTN (hypertension)   . PAF (paroxysmal atrial fibrillation) ([place])    s/p DCCV in 8/17    Past Surgical History:  Procedure Laterality Date  . BREAST BIOPSY    . CARDIOVERSION N/A 08/15/2016   Procedure: CARDIOVERSION;  Surgeon: Jerline Pain, MD;  Location: [facility];  Service: Cardiovascular;  Laterality: N/A;  . GALLBLADDER SURGERY    . HYSTEROTOMY     Patrtial  . HYSTEROTOMY     Complete  . KNEE ARTHROSCOPY      Current Medications: Outpatient Medications Prior to Visit  Medication Sig Dispense Refill  . acetaminophen (TYLENOL) 650 MG CR tablet Take 1,300 mg by mouth daily as needed for pain.    Marland Kitchen amLODipine (NORVASC) 10 MG tablet Take 1 tablet (10 mg total) by mouth daily. 90 tablet 3  . brinzolamide (AZOPT) 1 % ophthalmic suspension Place 1 drop into the right eye 3  (three) times daily.     . COMBIGAN 0.2-0.5 % ophthalmic solution 1 drop twice daily both eyes    . diphenhydramine-acetaminophen (TYLENOL PM) 25-500 MG TABS tablet Take 1 tablet by mouth at bedtime as needed (insomnia).     . fluticasone (FLONASE) 50 MCG/ACT nasal spray Place 2 sprays into the nose daily as needed for allergies.     Marland Kitchen latanoprost (XALATAN) 0.005 % ophthalmic solution Place 1 drop into both eyes at bedtime.   11  . losartan-hydrochlorothiazide (HYZAAR) 100-25 MG tablet Take 1 tablet by mouth daily. 90 tablet 3  . potassium chloride SA (K-DUR,KLOR-CON) 20 MEQ tablet Take 1 tablet (20 mEq total) by mouth daily. 90 tablet 3  . Rivaroxaban (XARELTO) 15 MG TABS tablet Take 1 tablet (15 mg total) by mouth daily with supper. 30 tablet 11  . simvastatin (ZOCOR) 20 MG tablet Take 20 mg by mouth every evening.  2  . carvedilol (COREG) 25 MG tablet TAKE 1 TABLET BY MOUTH TWICE A DAY WITH A MEAL (Patient taking differently: Take 25 mg by mouth 2 (two) times daily with a meal. ) 180 tablet 3   No facility-administered medications prior to visit.  Allergies:   Enalapril; Oxycodone; and Vicodin [hydrocodone-acetaminophen]   Social History   Social History  . Marital status: Married    Spouse name: N/A  . Number of children: N/A  . Years of education: N/A   Social History Main Topics  . Smoking status: Former Smoker    Quit date: 04/08/2016  . Smokeless tobacco: Never Used  . Alcohol use None  . Drug use: Unknown  . Sexual activity: Not Asked   Other Topics Concern  . None   Social History Narrative  . None     Family History:  The patient's family history includes Cancer in her father; Hypertension in her mother.   ROS:   Please see the history of present illness.    Review of Systems  Cardiovascular: Positive for irregular heartbeat.  Musculoskeletal: Positive for myalgias.   All other systems reviewed and are negative.   EKGs/Labs/Other Test Reviewed:     EKG:  EKG is  ordered today.  The ekg ordered today demonstrates NSR, HR 64, normal axis, QTc 425 ms, no changes  Recent Labs: 08/01/2016: BUN 33; Creat 2.22; Hemoglobin 11.1; Platelets 274; Potassium 4.4; Sodium 141   Recent Lipid Panel    Component Value Date/Time   CHOL 150 07/06/2011 0216   TRIG 123 07/06/2011 0216   HDL 46 07/06/2011 0216   CHOLHDL 3.3 07/06/2011 0216   VLDL 25 07/06/2011 0216   LDLCALC 79 07/06/2011 0216     Physical Exam:    VS:  BP (!) 170/90   Pulse 64   Ht 5' 2.5" (1.588 m)   Wt 236 lb (107 kg)   BMI 42.48 kg/m     Wt Readings from Last 3 Encounters:  09/08/16 236 lb (107 kg)  08/01/16 235 lb 1.9 oz (106.6 kg)  07/08/16 232 lb 12.8 oz (105.6 kg)     Physical Exam  Constitutional: She is oriented to person, place, and time. She appears well-developed and well-nourished. No distress.  HENT:  Head: Normocephalic and atraumatic.  Eyes: No scleral icterus.  Neck: No JVD present.  Cardiovascular: Normal rate, regular rhythm and normal heart sounds.   No murmur heard. Pulmonary/Chest: Effort normal. She has no wheezes. She has no rales.  Abdominal: Soft. There is no tenderness.  Musculoskeletal: She exhibits no edema.  Neurological: She is alert and oriented to person, place, and time.  Skin: Skin is warm and dry.  Psychiatric: She has a normal mood and affect.    ASSESSMENT:    1. Paroxysmal atrial fibrillation (HCC)   2. Essential hypertension   3. CKD (chronic kidney disease) stage 3, GFR 30-59 ml/min   4. Snoring    PLAN:    In order of problems listed above:  1. PAF - She is s/p DCCV.  She is doing well. She really had no change in symptoms with NSR.  So, she is appropriate for a strategy of rate control should AF recur. She is in need of eye surgery and needs Xarelto held.  She cannot hold Xarelto until 09/15/16.  She will call her eye surgeon to reschedule her procedure.  After 9/18, she can hold Xarelto for 5 days and resume when  felt to be safe. Continue Xarelto 15 mg QD.     2. Hypertension - BP is high today.  She has taken no medications yet.  She will monitor her BP at home and let us know if running above target.    3. CKD - Recent creatinine  stable.   4. Snoring - She has a hx of snoring and AFib.  Will arrange split night sleep study.    Medication Adjustments/Labs and Tests Ordered: Current medicines are reviewed at length with the patient today.  Concerns regarding medicines are outlined above.  Medication changes, Labs and Tests ordered today are outlined in the Patient Instructions noted below. Patient Instructions  Medication Instructions:  No changes. See your medication list.  Labwork: None today.  Testing/Procedures: Schedule Split Night Sleep Study (Dx: snoring, atrial fibrillation)  Follow-Up: Dr. Candee Furbish in 4 months.  Any Other Special Instructions Will Be Listed Below (If Applicable). 1. Check your blood pressure once daily for 2 weeks.  If most of your blood pressure readings are more than 140 on top or 90 on bottom, call us. 2. Tell your eye doctor that you cannot hold your Xarelto until 09/15/2016.  After this date, it is ok to hold it for 5 days prior to your eye surgery and resume after the surgery when felt to be safe by the surgeon.  If you need a refill on your cardiac medications before your next appointment, please call your pharmacy.   Signed, Richardson Dopp, PA-C  09/08/2016 5:26 PM    [facility] [place], [place], [place]  [address] Phone: [phone]; Fax: [phone]

## 2016-09-08 ENCOUNTER — Ambulatory Visit (INDEPENDENT_AMBULATORY_CARE_PROVIDER_SITE_OTHER): Payer: Medicare Other | Admitting: Physician Assistant

## 2016-09-08 ENCOUNTER — Encounter: Payer: Self-pay | Admitting: Physician Assistant

## 2016-09-08 ENCOUNTER — Telehealth: Payer: Self-pay | Admitting: Physician Assistant

## 2016-09-08 VITALS — BP 170/90 | HR 64 | Ht 62.5 in | Wt 236.0 lb

## 2016-09-08 DIAGNOSIS — N183 Chronic kidney disease, stage 3 unspecified: Secondary | ICD-10-CM

## 2016-09-08 DIAGNOSIS — R0683 Snoring: Secondary | ICD-10-CM | POA: Diagnosis not present

## 2016-09-08 DIAGNOSIS — I10 Essential (primary) hypertension: Secondary | ICD-10-CM

## 2016-09-08 DIAGNOSIS — I480 Paroxysmal atrial fibrillation: Secondary | ICD-10-CM | POA: Diagnosis not present

## 2016-09-08 NOTE — Telephone Encounter (Signed)
Called pt and advised she cannot have her eye surgery while she is on Xarelto. She has been advised this would be too high risk for bleeding. Advised pt per ov today Brynda Rim. PA recommends cannot hold Xarelto until 9/18, after that date it will be ok to hold Xarelto 5 days before her eye surgery and then resume after the surgery once the surgeon feels it is safe. Pt agreeable to plan of care.

## 2016-09-08 NOTE — Telephone Encounter (Signed)
New Message  Pt call requesting to speak with RN. Pt states she spoke with scott during appt today 9/11 to see if she could have the eye surgery while still taking the Xarelto. Please call back to discuss

## 2016-09-08 NOTE — Patient Instructions (Signed)
Medication Instructions:  No changes. See your medication list.  Labwork: None today.  Testing/Procedures: Schedule Split Night Sleep Study (Dx: snoring, atrial fibrillation)  Follow-Up: Dr. Candee Furbish in 4 months.  Any Other Special Instructions Will Be Listed Below (If Applicable). 1. Check your blood pressure once daily for 2 weeks.  If most of your blood pressure readings are more than 140 on top or 90 on bottom, call us. 2. Tell your eye doctor that you cannot hold your Xarelto until 09/15/2016.  After this date, it is ok to hold it for 5 days prior to your eye surgery and resume after the surgery when felt to be safe by the surgeon.  If you need a refill on your cardiac medications before your next appointment, please call your pharmacy.

## 2016-09-10 DIAGNOSIS — Z23 Encounter for immunization: Secondary | ICD-10-CM | POA: Diagnosis not present

## 2016-09-10 DIAGNOSIS — M2550 Pain in unspecified joint: Secondary | ICD-10-CM | POA: Diagnosis not present

## 2016-09-10 DIAGNOSIS — N183 Chronic kidney disease, stage 3 (moderate): Secondary | ICD-10-CM | POA: Diagnosis not present

## 2016-09-10 DIAGNOSIS — I110 Hypertensive heart disease with heart failure: Secondary | ICD-10-CM | POA: Diagnosis not present

## 2016-09-10 DIAGNOSIS — E785 Hyperlipidemia, unspecified: Secondary | ICD-10-CM | POA: Diagnosis not present

## 2016-09-17 DIAGNOSIS — R700 Elevated erythrocyte sedimentation rate: Secondary | ICD-10-CM | POA: Diagnosis not present

## 2016-09-17 DIAGNOSIS — M79641 Pain in right hand: Secondary | ICD-10-CM | POA: Diagnosis not present

## 2016-09-17 DIAGNOSIS — M79642 Pain in left hand: Secondary | ICD-10-CM | POA: Diagnosis not present

## 2016-09-17 DIAGNOSIS — M130 Polyarthritis, unspecified: Secondary | ICD-10-CM | POA: Diagnosis not present

## 2016-09-17 DIAGNOSIS — M25512 Pain in left shoulder: Secondary | ICD-10-CM | POA: Diagnosis not present

## 2016-09-17 DIAGNOSIS — M25562 Pain in left knee: Secondary | ICD-10-CM | POA: Diagnosis not present

## 2016-09-17 DIAGNOSIS — M25561 Pain in right knee: Secondary | ICD-10-CM | POA: Diagnosis not present

## 2016-09-17 DIAGNOSIS — R799 Abnormal finding of blood chemistry, unspecified: Secondary | ICD-10-CM | POA: Diagnosis not present

## 2016-10-01 DIAGNOSIS — M2550 Pain in unspecified joint: Secondary | ICD-10-CM | POA: Diagnosis not present

## 2016-10-01 DIAGNOSIS — N183 Chronic kidney disease, stage 3 (moderate): Secondary | ICD-10-CM | POA: Diagnosis not present

## 2016-10-01 DIAGNOSIS — E1122 Type 2 diabetes mellitus with diabetic chronic kidney disease: Secondary | ICD-10-CM | POA: Diagnosis not present

## 2016-10-06 DIAGNOSIS — H2511 Age-related nuclear cataract, right eye: Secondary | ICD-10-CM | POA: Diagnosis not present

## 2016-10-06 DIAGNOSIS — H4051X1 Glaucoma secondary to other eye disorders, right eye, mild stage: Secondary | ICD-10-CM | POA: Diagnosis not present

## 2016-10-06 DIAGNOSIS — H25811 Combined forms of age-related cataract, right eye: Secondary | ICD-10-CM | POA: Diagnosis not present

## 2016-10-06 DIAGNOSIS — H4053X1 Glaucoma secondary to other eye disorders, bilateral, mild stage: Secondary | ICD-10-CM | POA: Diagnosis not present

## 2016-10-08 DIAGNOSIS — M79641 Pain in right hand: Secondary | ICD-10-CM | POA: Diagnosis not present

## 2016-10-08 DIAGNOSIS — R799 Abnormal finding of blood chemistry, unspecified: Secondary | ICD-10-CM | POA: Diagnosis not present

## 2016-10-08 DIAGNOSIS — M130 Polyarthritis, unspecified: Secondary | ICD-10-CM | POA: Diagnosis not present

## 2016-10-08 DIAGNOSIS — Z79899 Other long term (current) drug therapy: Secondary | ICD-10-CM | POA: Diagnosis not present

## 2016-10-08 DIAGNOSIS — R700 Elevated erythrocyte sedimentation rate: Secondary | ICD-10-CM | POA: Diagnosis not present

## 2016-10-08 DIAGNOSIS — M79642 Pain in left hand: Secondary | ICD-10-CM | POA: Diagnosis not present

## 2016-10-15 ENCOUNTER — Telehealth: Payer: Self-pay | Admitting: Cardiology

## 2016-10-15 DIAGNOSIS — R801 Persistent proteinuria, unspecified: Secondary | ICD-10-CM | POA: Diagnosis not present

## 2016-10-15 DIAGNOSIS — D631 Anemia in chronic kidney disease: Secondary | ICD-10-CM | POA: Diagnosis not present

## 2016-10-15 DIAGNOSIS — I129 Hypertensive chronic kidney disease with stage 1 through stage 4 chronic kidney disease, or unspecified chronic kidney disease: Secondary | ICD-10-CM | POA: Diagnosis not present

## 2016-10-15 DIAGNOSIS — M069 Rheumatoid arthritis, unspecified: Secondary | ICD-10-CM | POA: Diagnosis not present

## 2016-10-15 DIAGNOSIS — N184 Chronic kidney disease, stage 4 (severe): Secondary | ICD-10-CM | POA: Diagnosis not present

## 2016-10-15 DIAGNOSIS — E1129 Type 2 diabetes mellitus with other diabetic kidney complication: Secondary | ICD-10-CM | POA: Diagnosis not present

## 2016-10-15 DIAGNOSIS — I480 Paroxysmal atrial fibrillation: Secondary | ICD-10-CM | POA: Diagnosis not present

## 2016-10-15 DIAGNOSIS — E669 Obesity, unspecified: Secondary | ICD-10-CM | POA: Diagnosis not present

## 2016-10-15 NOTE — Telephone Encounter (Signed)
New Message  Pt voiced she is needing to speak with nurse and it's about her Bp.  That's all the pt would go into detail about.

## 2016-10-15 NOTE — Telephone Encounter (Signed)
Pt states Melissa Dopp, PA-C advised her to keep record of BP's and call with report.  She Dr. Marlou Porch follows her BP.  BP's are 09/08/16-10/14/16, she denies symptoms with any readings, all taken AM before medication.  BP  HR 137/37  56 170/83  55 172/79  57 122/66  65 173/76  56 139/80  64 140/70  64  127/70  72 140/69  55 148/75  61 165/110 95 131/79  83 134/67  58 127/74  55 170/80 was BP taken at PCP OV 10/14/16. Pt states PCP gave advise on "taking good BP's".  She states she will continue taking BP and wait for [place] to call her back with recommendation. She verified medication list.

## 2016-10-16 NOTE — Telephone Encounter (Signed)
I spoke to patient. Advised of instructions and recommendations. Patient gave verbal read back and voiced understanding.  She agreed with plan.

## 2016-10-16 NOTE — Telephone Encounter (Signed)
Some blood pressures are optimal and some are high. Were the high BPs before medications or while active?? Follow advice of PCP. Make sure blood pressure checked once a day. Check blood pressure at least 2-3 hours after taking blood pressure medication. Make sure seated for 10 minutes or more before checking BP. Make sure feet flat on floor (make sure feet not crossed). Make sure arm is at level of the heart when checking. Make sure no caffeine/tobacco products within 30 minutes of checking blood pressure. Send readings after 2 weeks. Richardson Dopp, PA-C   10/16/2016 9:34 AM

## 2016-10-17 ENCOUNTER — Other Ambulatory Visit: Payer: Self-pay | Admitting: Nephrology

## 2016-10-17 DIAGNOSIS — N184 Chronic kidney disease, stage 4 (severe): Secondary | ICD-10-CM

## 2016-10-20 ENCOUNTER — Ambulatory Visit
Admission: RE | Admit: 2016-10-20 | Discharge: 2016-10-20 | Disposition: A | Discharge status: Home / Self Care | Payer: Medicare Other | Source: Ambulatory Visit | Attending: Nephrology | Admitting: Nephrology

## 2016-10-20 DIAGNOSIS — N184 Chronic kidney disease, stage 4 (severe): Secondary | ICD-10-CM

## 2016-10-21 DIAGNOSIS — R801 Persistent proteinuria, unspecified: Secondary | ICD-10-CM | POA: Diagnosis not present

## 2016-10-26 ENCOUNTER — Encounter (HOSPITAL_BASED_OUTPATIENT_CLINIC_OR_DEPARTMENT_OTHER): Payer: Medicare Other

## 2016-11-05 ENCOUNTER — Ambulatory Visit (HOSPITAL_BASED_OUTPATIENT_CLINIC_OR_DEPARTMENT_OTHER): Payer: Medicare Other | Attending: Physician Assistant | Admitting: Cardiology

## 2016-11-05 DIAGNOSIS — G4736 Sleep related hypoventilation in conditions classified elsewhere: Secondary | ICD-10-CM | POA: Insufficient documentation

## 2016-11-05 DIAGNOSIS — I480 Paroxysmal atrial fibrillation: Secondary | ICD-10-CM | POA: Diagnosis not present

## 2016-11-05 DIAGNOSIS — G4733 Obstructive sleep apnea (adult) (pediatric): Secondary | ICD-10-CM | POA: Diagnosis not present

## 2016-11-05 DIAGNOSIS — R0683 Snoring: Secondary | ICD-10-CM

## 2016-11-06 ENCOUNTER — Telehealth: Payer: Self-pay | Admitting: Cardiology

## 2016-11-06 ENCOUNTER — Encounter: Payer: Self-pay | Admitting: *Deleted

## 2016-11-06 NOTE — Telephone Encounter (Signed)
New message    Patient calling stating Dr. Marlou Sa office have no receive the cardiac clearance for upcoming eye surgery -  left eye.   Surgery on  11.13.2017   Dr. Marlou Sa Office  # [phone]

## 2016-11-06 NOTE — Telephone Encounter (Signed)
Dr Marlou Porch has not received a clearance request for this pt.  He did review her chart and gave clearance and approval to hold Xarelto for 5 days and to resume ASAP after her procedure. Letter was completed and taken to MR to be faxed.

## 2016-11-06 NOTE — Telephone Encounter (Signed)
Pt is aware.  

## 2016-11-07 ENCOUNTER — Telehealth: Payer: Self-pay | Admitting: Cardiology

## 2016-11-07 NOTE — Telephone Encounter (Signed)
Pt aware letter was faxed to the # she left today.  The letter had been faxed to the number given by [facility] yesterday.

## 2016-11-07 NOTE — Telephone Encounter (Signed)
Pt is still waiting on clearance to be faxed over to [place] at [phone]

## 2016-11-10 DIAGNOSIS — H4053X1 Glaucoma secondary to other eye disorders, bilateral, mild stage: Secondary | ICD-10-CM | POA: Diagnosis not present

## 2016-11-10 DIAGNOSIS — H25812 Combined forms of age-related cataract, left eye: Secondary | ICD-10-CM | POA: Diagnosis not present

## 2016-11-10 DIAGNOSIS — H4052X1 Glaucoma secondary to other eye disorders, left eye, mild stage: Secondary | ICD-10-CM | POA: Diagnosis not present

## 2016-11-16 ENCOUNTER — Encounter (HOSPITAL_BASED_OUTPATIENT_CLINIC_OR_DEPARTMENT_OTHER): Payer: Self-pay | Admitting: Cardiology

## 2016-11-16 DIAGNOSIS — G4733 Obstructive sleep apnea (adult) (pediatric): Secondary | ICD-10-CM | POA: Insufficient documentation

## 2016-11-16 NOTE — Procedures (Signed)
   Patient Name: Melissa Bartlett, Daddario Date: 11/05/2016 Gender: Female D.O.B: 05/05/1946 Age (years): 37 Referring Provider: Richardson Dopp Height (inches): 19 Interpreting Physician: Fransico Him MD, ABSM Weight (lbs): 236 RPSGT: Zadie Rhine BMI: 42 MRN: [id number] Neck Size: 14.50 CLINICAL INFORMATION Sleep Study Type: NPSG Indication for sleep study: Snoring Epworth Sleepiness Score: 6  SLEEP STUDY TECHNIQUE As per the AASM Manual for the Scoring of Sleep and Associated Events v2.3 (April 2016) with a hypopnea requiring 4% desaturations. The channels recorded and monitored were frontal, central and occipital EEG, electrooculogram (EOG), submentalis EMG (chin), nasal and oral airflow, thoracic and abdominal wall motion, anterior tibialis EMG, snore microphone, electrocardiogram, and pulse oximetry.  MEDICATIONS Medications self-administered by patient taken the night of the study : TYLENOL PM  SLEEP ARCHITECTURE The study was initiated at 9:59:45 PM and ended at 4:24:10 AM. Sleep onset time was 26.1 minutes and the sleep efficiency was 76.0%. The total sleep time was 292.0 minutes. Stage REM latency was 111.0 minutes. The patient spent 4.45% of the night in stage N1 sleep, 70.89% in stage N2 sleep, 9.42% in stage N3 and 15.24% in REM. Alpha intrusion was absent. Supine sleep was 55.75%.  RESPIRATORY PARAMETERS The overall apnea/hypopnea index (AHI) was 11.7 per hour. There were 3 total apneas, including 2 obstructive, 1 central and 0 mixed apneas. There were 54 hypopneas and 17 RERAs. The AHI during Stage REM sleep was 43.1 per hour. AHI while supine was 6.6 per hour. The mean oxygen saturation was 90.39%. The minimum SpO2 during sleep was 69.00%.  Total time spent with oxygen saturations < 88% was 32 minutes Soft snoring was noted during this study.  EKG The 2 lead EKG demonstrated sinus rhythm. The mean heart rate was 57.37 beats per minute. Other EKG findings include:  rare PVC and occasional PACs and atrial couplets  LEG MOVEMENT DATA The total PLMS were 0 with a resulting PLMS index of 0.00. Associated arousal with leg movement index was 0.0 .  IMPRESSIONS - Mild obstructive sleep apnea occurred during this study (AHI = 11.7/h). - No significant central sleep apnea occurred during this study (CAI = 0.2/h). - Severe oxygen desaturation was noted during this study (Min O2 = 69.00%). - The patient snored with Soft snoring volume. - PACs, atrial couplets and rare PVC were noted during this study. - Clinically significant periodic limb movements did not occur during sleep. No significant associated arousals.  DIAGNOSIS - Obstructive Sleep Apnea (327.23 [G47.33 ICD-10]) - Nocturnal Hypoxemia (327.26 [G47.36 ICD-10])  RECOMMENDATIONS - Therapeutic CPAP titration to determine optimal pressure required to alleviate sleep disordered breathing. - Avoid alcohol, sedatives and other CNS depressants that may worsen sleep apnea and disrupt normal sleep architecture. - Sleep hygiene should be reviewed to assess factors that may improve sleep quality. - Weight management and regular exercise should be initiated or continued if appropriate.  [place], American Board of Sleep Medicine  ELECTRONICALLY SIGNED ON:  11/16/2016, 4:36 PM [place] PH: [phone]   FX: [phone] [place]

## 2016-11-17 ENCOUNTER — Other Ambulatory Visit: Payer: Self-pay | Admitting: *Deleted

## 2016-11-17 ENCOUNTER — Encounter: Payer: Self-pay | Admitting: *Deleted

## 2016-11-17 DIAGNOSIS — G4733 Obstructive sleep apnea (adult) (pediatric): Secondary | ICD-10-CM

## 2016-11-17 NOTE — Progress Notes (Signed)
Spoke to patient about her results and she verbalized understanding

## 2016-12-09 DIAGNOSIS — N183 Chronic kidney disease, stage 3 (moderate): Secondary | ICD-10-CM | POA: Diagnosis not present

## 2016-12-09 DIAGNOSIS — M130 Polyarthritis, unspecified: Secondary | ICD-10-CM | POA: Diagnosis not present

## 2016-12-09 DIAGNOSIS — R799 Abnormal finding of blood chemistry, unspecified: Secondary | ICD-10-CM | POA: Diagnosis not present

## 2016-12-09 DIAGNOSIS — M7532 Calcific tendinitis of left shoulder: Secondary | ICD-10-CM | POA: Diagnosis not present

## 2016-12-18 DIAGNOSIS — M25512 Pain in left shoulder: Secondary | ICD-10-CM | POA: Diagnosis not present

## 2016-12-18 DIAGNOSIS — Z79899 Other long term (current) drug therapy: Secondary | ICD-10-CM | POA: Diagnosis not present

## 2016-12-18 DIAGNOSIS — M130 Polyarthritis, unspecified: Secondary | ICD-10-CM | POA: Diagnosis not present

## 2016-12-18 DIAGNOSIS — R799 Abnormal finding of blood chemistry, unspecified: Secondary | ICD-10-CM | POA: Diagnosis not present

## 2016-12-18 DIAGNOSIS — M79671 Pain in right foot: Secondary | ICD-10-CM | POA: Diagnosis not present

## 2016-12-18 DIAGNOSIS — M79672 Pain in left foot: Secondary | ICD-10-CM | POA: Diagnosis not present

## 2016-12-18 DIAGNOSIS — M25441 Effusion, right hand: Secondary | ICD-10-CM | POA: Diagnosis not present

## 2016-12-18 DIAGNOSIS — M25442 Effusion, left hand: Secondary | ICD-10-CM | POA: Diagnosis not present

## 2016-12-26 ENCOUNTER — Telehealth: Payer: Self-pay | Admitting: Cardiology

## 2016-12-26 NOTE — Telephone Encounter (Signed)
Spoke with the pt and she was instructed by her Oral Surgeon to call our office to ask Dr Marlou Porch for medication clearance from Xarelto, for her upcoming tooth extraction in the beginning of the New Year.  Pt states he Oral Surgeon provided no contact information for our advise to fax too, once clearance received.   Advised the pt that per our policy and for continuity of care, she should call her Oral Surgeon's office back today, and advise for them to call our office back to leave clearance information needed along with their contact information to fax clearance back too.  Informed the pt that the Surgeon's office can directly give this information to one of our operators, so this can be routed to Dr Marlou Porch to review and advise on. Also informed the pt that if her Surgeon's office chooses to fax clearance information to Dr Marlou Porch, then they may do so by faxing this at [phone] ATTENTION: Dr Marlou Porch and Jeannene Patella RN.  Informed the pt that this can be a slower process, so I advised her to have her Surgeon's  office call instead.   Pt verbalized understanding and agrees with this plan.  Pt gracious for all the assistance provided. Pt states she will contact them now.  Will route this message to both Dr Marlou Porch and RN as an Juluis Rainier.

## 2016-12-26 NOTE — Telephone Encounter (Signed)
Ms. Purtle is calling because she has a tooth extraction scheduled after the first of the year. She currently takes Xarelto and wants to know if she needs to stop taking medication before the extraction. Please call, thanks.

## 2016-12-28 NOTE — Telephone Encounter (Signed)
We do not routinely stop Xarelto for one tooth extraction. Candee Furbish, MD

## 2017-01-01 ENCOUNTER — Telehealth: Payer: Self-pay | Admitting: Cardiology

## 2017-01-01 NOTE — Telephone Encounter (Signed)
We did not have information RE: Dentist to send this information to.  It will be faxed today.  Jerline Pain, MD  You 4 days ago    We do not routinely stop Xarelto for one tooth extraction. Candee Furbish, MD    Documentation     Nuala Alpha, LPN  You; Jerline Pain, MD 6 days ago    Pt will have Oral Surgeon office call or send you a clearance to advise on and fax back (Routing comment)     Nuala Alpha, LPN 6 days ago    Spoke with the pt and she was instructed by her Oral Surgeon to call our office to ask Dr Marlou Porch for medication clearance from Xarelto, for her upcoming tooth extraction in the beginning of the New Year.  Pt states he Oral Surgeon provided no contact information for our advise to fax too, once clearance received.   Advised the pt that per our policy and for continuity of care, she should call her Oral Surgeon's office back today, and advise for them to call our office back to leave clearance information needed along with their contact information to fax clearance back too.  Informed the pt that the Surgeon's office can directly give this information to one of our operators, so this can be routed to Dr Marlou Porch to review and advise on. Also informed the pt that if her Surgeon's office chooses to fax clearance information to Dr Marlou Porch, then they may do so by faxing this at [phone] ATTENTION: Dr Marlou Porch and Jeannene Patella RN.  Informed the pt that this can be a slower process, so I advised her to have her Surgeon's  office call instead.   Pt verbalized understanding and agrees with this plan.  Pt gracious for all the assistance provided. Pt states she will contact them now.  Will route this message to both Dr Marlou Porch and RN as an Juluis Rainier.    Documentation     Marjean Donna routed conversation to [facility] [employer] Triage 6 days ago    Marjean Donna 6 days ago    Ms. Mcmannis is calling because she has a tooth extraction scheduled after the first of the year. She currently  takes Xarelto and wants to know if she needs to stop taking medication before the extraction. Please call, thanks.    Documentation     Ebonye L. Danielle Dess [phone]  Marjean Donna

## 2017-01-01 NOTE — Telephone Encounter (Signed)
Follow Up:    Dr Wynetta Emery still have not received the clearance. They need something in writing,please mail the clearance to him. The fax machine is not working.Please mail to: Dr Alford Highland, [place], [place], [place].Pt's appointment is 01-12-17.

## 2017-01-01 NOTE — Telephone Encounter (Signed)
Dr Viona Gilmore. Lurena Joiner Johnson's office is aware pt is not required to hold Xarelto prior to extraction. Reviewed the address to be [address], [place], [place], [place] [address]  This will be put in the mail today.

## 2017-01-01 NOTE — Telephone Encounter (Signed)
Correction - this will be placed in the mail today as requested,

## 2017-01-05 ENCOUNTER — Encounter: Payer: Self-pay | Admitting: Cardiology

## 2017-01-05 ENCOUNTER — Ambulatory Visit (INDEPENDENT_AMBULATORY_CARE_PROVIDER_SITE_OTHER): Payer: Medicare Other | Admitting: Cardiology

## 2017-01-05 VITALS — BP 136/82 | HR 60 | Ht 62.0 in | Wt 243.6 lb

## 2017-01-05 DIAGNOSIS — I480 Paroxysmal atrial fibrillation: Secondary | ICD-10-CM

## 2017-01-05 DIAGNOSIS — N183 Chronic kidney disease, stage 3 unspecified: Secondary | ICD-10-CM

## 2017-01-05 NOTE — Patient Instructions (Signed)

## 2017-01-05 NOTE — Progress Notes (Signed)
[place]. [address] [place], [place]  [address] Phone: [phone] Fax:  [phone]  Date:  01/05/2017   ID:  Melissa Bartlett, DOB September 25, 1946, MRN [id number]  PCP:  Melissa Logan, MD   History of Present Illness: Melissa Bartlett is a 71 y.o. female with difficult to control hypertension on multidrug regimen. 120's-137 at home. Prior lab work showed elevated creatinine of 2.09 in April 2017. No shortness of breath, no syncope, no chest pain. Occasional swelling, seen diet wrist, considering vein specialist..   In regards to her tobacco use, she tried Chantix and was successful over the last few weeks. Had terrible nausea and vomiting with this medication however. Overall she is feeling well. Her daughter unfortunately has pseudotumor cerebri and underwent shunt procedure and had difficulty with motor weakness, difficulty speaking.   Interestingly, on 07/08/16, atrial fibrillation was discovered. Asymptomatic. No history of stroke.  Cardioversion --felt no change. No bleeding, no chest pain, no syncope.  Currently on prednisone taper from Dr. Charlestine Bartlett for rheumatoid arthritis.   Wt Readings from Last 3 Encounters:  01/05/17 243 lb 9.6 oz (110.5 kg)  11/05/16 236 lb (107 kg)  09/08/16 236 lb (107 kg)     Past Medical History:  Diagnosis Date  . Diabetes ([place])   . HTN (hypertension)   . OSA (obstructive sleep apnea)    with AHI 11/hr with nocturnal hypoxemia   . PAF (paroxysmal atrial fibrillation) ([place])    s/p DCCV in 8/17    Past Surgical History:  Procedure Laterality Date  . BREAST BIOPSY    . CARDIOVERSION N/A 08/15/2016   Procedure: CARDIOVERSION;  Surgeon: Jerline Pain, MD;  Location: [facility];  Service: Cardiovascular;  Laterality: N/A;  . GALLBLADDER SURGERY    . HYSTEROTOMY     Patrtial  . HYSTEROTOMY     Complete  . KNEE ARTHROSCOPY      Current Outpatient Prescriptions  Medication Sig Dispense Refill  . acetaminophen (TYLENOL) 650 MG  CR tablet Take 1,300 mg by mouth daily as needed for pain.    Marland Kitchen amLODipine (NORVASC) 10 MG tablet Take 1 tablet (10 mg total) by mouth daily. 90 tablet 3  . brinzolamide (AZOPT) 1 % ophthalmic suspension Place 1 drop into the right eye 3 (three) times daily.     . Calcium 600-400 MG-UNIT CHEW Chew 1 tablet by mouth 2 (two) times daily.    . carvedilol (COREG) 25 MG tablet Take 25 mg by mouth 2 (two) times daily with a meal.    . COMBIGAN 0.2-0.5 % ophthalmic solution 1 drop twice daily both eyes    . diphenhydramine-acetaminophen (TYLENOL PM) 25-500 MG TABS tablet Take 1 tablet by mouth at bedtime as needed (insomnia).     . fluticasone (FLONASE) 50 MCG/ACT nasal spray Place 2 sprays into the nose daily as needed for allergies.     . folic acid (FOLVITE) 027 MCG tablet Take 400 mcg by mouth daily.    Marland Kitchen latanoprost (XALATAN) 0.005 % ophthalmic solution Place 1 drop into both eyes at bedtime.   11  . losartan-hydrochlorothiazide (HYZAAR) 100-25 MG tablet Take 1 tablet by mouth daily. 90 tablet 3  . potassium chloride SA (K-DUR,KLOR-CON) 20 MEQ tablet Take 1 tablet (20 mEq total) by mouth daily. 90 tablet 3  . predniSONE (DELTASONE) 10 MG tablet Take 10 mg by mouth daily.  0  . Rivaroxaban (XARELTO) 15 MG TABS tablet Take 1 tablet (15 mg  total) by mouth daily with supper. 30 tablet 11  . simvastatin (ZOCOR) 20 MG tablet Take 20 mg by mouth every evening.  2  . vitamin B-12 (CYANOCOBALAMIN) 1000 MCG tablet Take 1,000 mcg by mouth daily.     No current facility-administered medications for this visit.     Allergies:    Allergies  Allergen Reactions  . Enalapril Other (See Comments)    Systemic reaction - jerking  . Oxycodone Itching  . Vicodin [Hydrocodone-Acetaminophen] Itching    Social History:  The patient  reports that she quit smoking about 8 months ago. She has never used smokeless tobacco.   ROS:  Please see the history of present illness.   Denies any fever, chills, orthopnea,  PND  PHYSICAL EXAM: VS:  BP 136/82   Pulse 60   Ht 5\' 2"  (1.575 m)   Wt 243 lb 9.6 oz (110.5 kg)   LMP  (LMP Unknown)   BMI 44.56 kg/m  Well nourished, well developed, in no acute distress  HEENT: normal  Neck: no JVD  Cardiac:  normal S1, S2; irregularly irregular, normal rate; no murmur  Lungs:  clear to auscultation bilaterally, no wheezing, rhonchi or rales  Abd: soft, nontender, no hepatomegaly obese Ext: no edema Other than minor right ankle swelling for the past few weeks, wearing ankle support Skin: warm and dry  Neuro: no focal abnormalities noted  EKG:  07/08/16 - AFIB 75, NSSTW changes. 07/09/15 - SB with PRWP no changes. 03/07/14 - NSR no other changes.   ASSESSMENT AND PLAN:  1. AFIB - discovered on 07/08/16 CHADS-VASc -3 female and HTN, Age (A1c 5.6 on no meds). cardioversion at that time. She is completely asymptomatic at this time. She did not realize that she was in atrial fibrillation. Risk factor includes obesity. She is well rate controlled currently.  2. Chronic anticoagulation-we will start Xarelto 15 mg in the evening. Serum creatinine is 2.1 on 04/08/16. GFR is 27. (15-50) We discussed bleeding risks. 3. Stage IV chronic kidney disease-as above. Avoid NSAIDs. Reviewed nephrology notes. 4. Hypertension- At home she has readings in the 120 range. Continue with current medication. several medications to help control blood pressure. She admits that sometimes she does not remember to take her second carvedilol. 5. Morbid Obesity- continue to encourage weight loss. This is contributing to her lower external edema. Her podiatrist is considering sending her to a pain specialist. She likely has a degree of venous insufficiency as well as. Continue with compression. Weight loss. 6. Tobacco use-excellent job with tobacco cessation 7. A1c 5.6. Excellent. 8. Obstructive sleep apnea-sleep study November 2017-Melissa Bartlett, CPAP 9. 6 months with me.  Signed, Melissa Furbish, MD [facility]   01/05/2017 2:08 PM

## 2017-01-11 ENCOUNTER — Ambulatory Visit (HOSPITAL_BASED_OUTPATIENT_CLINIC_OR_DEPARTMENT_OTHER): Payer: Medicare Other | Attending: Cardiology | Admitting: Cardiology

## 2017-01-11 VITALS — Ht 62.5 in | Wt 236.0 lb

## 2017-01-11 DIAGNOSIS — G4733 Obstructive sleep apnea (adult) (pediatric): Secondary | ICD-10-CM

## 2017-01-11 DIAGNOSIS — I4891 Unspecified atrial fibrillation: Secondary | ICD-10-CM | POA: Diagnosis not present

## 2017-01-12 NOTE — Procedures (Signed)
   Patient Name: Melissa Bartlett, Melissa Bartlett Date: 01/11/2017 Gender: Female D.O.B: 27-Dec-1946 Age (years): 45 Referring Provider: Richardson Dopp Height (inches): 63 Interpreting Physician: Fransico Him MD, ABSM Weight (lbs): 236 RPSGT: Baxter Flattery BMI: 42 MRN: [id number] Neck Size: 14.50  CLINICAL INFORMATION The patient is referred for a CPAP titration to treat sleep apnea  Date of NPSG, Split Night or HST:11/05/2016  SLEEP STUDY TECHNIQUE As per the AASM Manual for the Scoring of Sleep and Associated Events v2.3 (April 2016) with a hypopnea requiring 4% desaturations.  The channels recorded and monitored were frontal, central and occipital EEG, electrooculogram (EOG), submentalis EMG (chin), nasal and oral airflow, thoracic and abdominal wall motion, anterior tibialis EMG, snore microphone, electrocardiogram, and pulse oximetry. Continuous positive airway pressure (CPAP) was initiated at the beginning of the study and titrated to treat sleep-disordered breathing.  MEDICATIONS Medications self-administered by patient taken the night of the study : TYLENOL PM  TECHNICIAN COMMENTS Comments added by technician: Patient had difficulty initiating sleep.  Comments added by scorer: N/A  RESPIRATORY PARAMETERS Optimal PAP Pressure (cm): 13  AHI at Optimal Pressure (/hr):0.0 Overall Minimal O2 (%):80.00  Supine % at Optimal Pressure (%): 0 Minimal O2 at Optimal Pressure (%): 94.0  SLEEP ARCHITECTURE The study was initiated at 11:01:49 PM and ended at 5:27:36 AM.  Sleep onset time was 20.0 minutes and the sleep efficiency was 86.4%. The total sleep time was 333.5 minutes.  The patient spent 5.55% of the night in stage N1 sleep, 61.17% in stage N2 sleep, 0.15% in stage N3 and 33.13% in REM.Stage REM latency was 89.5 minutes  Wake after sleep onset was 32.3. Alpha intrusion was absent. Supine sleep was 12.18%.  CARDIAC DATA The 2 lead EKG demonstrated atrial fibrillation. The mean  heart rate was 75.68 beats per minute.   LEG MOVEMENT DATA The total Periodic Limb Movements of Sleep (PLMS) were 0. The PLMS index was 0.00. A PLMS index of <15 is considered normal in adults.  IMPRESSIONS - The optimal PAP pressure was 13 cm of water. - Central sleep apnea was not noted during this titration (CAI = 0.0/h). - Severe oxygen desaturations were observed during this titration (min O2 = 80.00%). - No snoring was audible during this study. - Atrial Fibrillation was observed during this study. - Clinically significant periodic limb movements were not noted during this study. Arousals associated with PLMs were rare.  DIAGNOSIS - Obstructive Sleep Apnea (327.23 [G47.33 ICD-10]) - Atrial Fibrillation  RECOMMENDATIONS - Trial of CPAP therapy on 13 cm H2O with a Small size Resmed Full Face Mask AirFit F20 mask and heated humidification. - Avoid alcohol, sedatives and other CNS depressants that may worsen sleep apnea and disrupt normal sleep architecture. - Sleep hygiene should be reviewed to assess factors that may improve sleep quality. - Weight management and regular exercise should be initiated or continued. - Return to Sleep Center for re-evaluation after 10 weeks of therapy  [place], [place] [facility]  ELECTRONICALLY SIGNED ON:  01/12/2017, 4:59 PM [place] PH: [phone]   FX: [phone] [place]

## 2017-01-13 ENCOUNTER — Telehealth: Payer: Self-pay | Admitting: *Deleted

## 2017-01-13 DIAGNOSIS — M0570 Rheumatoid arthritis with rheumatoid factor of unspecified site without organ or systems involvement: Secondary | ICD-10-CM | POA: Diagnosis not present

## 2017-01-13 DIAGNOSIS — M79642 Pain in left hand: Secondary | ICD-10-CM | POA: Diagnosis not present

## 2017-01-13 DIAGNOSIS — M79671 Pain in right foot: Secondary | ICD-10-CM | POA: Diagnosis not present

## 2017-01-13 DIAGNOSIS — M79672 Pain in left foot: Secondary | ICD-10-CM | POA: Diagnosis not present

## 2017-01-13 DIAGNOSIS — M79641 Pain in right hand: Secondary | ICD-10-CM | POA: Diagnosis not present

## 2017-01-13 NOTE — Telephone Encounter (Signed)
-----   Message from Melissa Margarita, MD sent at 01/12/2017  5:02 PM EST ----- Pt had successful PAP titration. Please setup appointment in 10 weeks. Please let [facility] know that order for PAP is in EPIC.

## 2017-01-13 NOTE — Telephone Encounter (Signed)
Called the patient and gave her the sleep study results, she verbalized understanding and agreed

## 2017-02-10 DIAGNOSIS — M79641 Pain in right hand: Secondary | ICD-10-CM | POA: Diagnosis not present

## 2017-02-10 DIAGNOSIS — N183 Chronic kidney disease, stage 3 (moderate): Secondary | ICD-10-CM | POA: Diagnosis not present

## 2017-02-10 DIAGNOSIS — M0570 Rheumatoid arthritis with rheumatoid factor of unspecified site without organ or systems involvement: Secondary | ICD-10-CM | POA: Diagnosis not present

## 2017-02-10 DIAGNOSIS — M79642 Pain in left hand: Secondary | ICD-10-CM | POA: Diagnosis not present

## 2017-02-11 DIAGNOSIS — M069 Rheumatoid arthritis, unspecified: Secondary | ICD-10-CM | POA: Diagnosis not present

## 2017-02-11 DIAGNOSIS — D631 Anemia in chronic kidney disease: Secondary | ICD-10-CM | POA: Diagnosis not present

## 2017-02-11 DIAGNOSIS — I480 Paroxysmal atrial fibrillation: Secondary | ICD-10-CM | POA: Diagnosis not present

## 2017-02-11 DIAGNOSIS — I129 Hypertensive chronic kidney disease with stage 1 through stage 4 chronic kidney disease, or unspecified chronic kidney disease: Secondary | ICD-10-CM | POA: Diagnosis not present

## 2017-02-11 DIAGNOSIS — E1129 Type 2 diabetes mellitus with other diabetic kidney complication: Secondary | ICD-10-CM | POA: Diagnosis not present

## 2017-02-11 DIAGNOSIS — E669 Obesity, unspecified: Secondary | ICD-10-CM | POA: Diagnosis not present

## 2017-02-11 DIAGNOSIS — N184 Chronic kidney disease, stage 4 (severe): Secondary | ICD-10-CM | POA: Diagnosis not present

## 2017-02-11 DIAGNOSIS — R801 Persistent proteinuria, unspecified: Secondary | ICD-10-CM | POA: Diagnosis not present

## 2017-03-02 DIAGNOSIS — M0570 Rheumatoid arthritis with rheumatoid factor of unspecified site without organ or systems involvement: Secondary | ICD-10-CM | POA: Diagnosis not present

## 2017-03-02 DIAGNOSIS — Z79899 Other long term (current) drug therapy: Secondary | ICD-10-CM | POA: Diagnosis not present

## 2017-03-06 DIAGNOSIS — R600 Localized edema: Secondary | ICD-10-CM | POA: Diagnosis not present

## 2017-03-06 DIAGNOSIS — R0609 Other forms of dyspnea: Secondary | ICD-10-CM | POA: Diagnosis not present

## 2017-03-06 DIAGNOSIS — I5030 Unspecified diastolic (congestive) heart failure: Secondary | ICD-10-CM | POA: Diagnosis not present

## 2017-03-06 DIAGNOSIS — I509 Heart failure, unspecified: Secondary | ICD-10-CM | POA: Diagnosis not present

## 2017-03-06 DIAGNOSIS — I110 Hypertensive heart disease with heart failure: Secondary | ICD-10-CM | POA: Diagnosis not present

## 2017-03-10 ENCOUNTER — Telehealth: Payer: Self-pay | Admitting: Cardiology

## 2017-03-10 NOTE — Telephone Encounter (Signed)
New Message   Pt would like to know if she should continue taking furosemide. Requests a call back.

## 2017-03-10 NOTE — Telephone Encounter (Signed)
Patient went to her doctor on Friday and was up 15 pounds. She had been gaining weight steadily since she started prednisone.  Her MD instructed her to restart Lasix 40 mg. She has taken daily since Friday and is down almost the entire 15 pounds.  She feels much better, her swelling is almost completely gone. Her prednisone was also dropped to 5 mg daily. Her BP is staying around 120-130/70s. She would like to know if Dr. Marlou Porch would like to continue to Lasix 40 mg daily or stop when she is done with her pills (she has about 5 doses left).  To Dr. Marlou Porch for recommendations.

## 2017-03-11 DIAGNOSIS — H4053X1 Glaucoma secondary to other eye disorders, bilateral, mild stage: Secondary | ICD-10-CM | POA: Diagnosis not present

## 2017-03-11 NOTE — Telephone Encounter (Signed)
Pt aware OK to take on an as needed basis.

## 2017-03-11 NOTE — Telephone Encounter (Signed)
I would recommend only using the Lasix on an as-needed basis weight increases 2-3 pounds overnight. Candee Furbish, MD

## 2017-03-14 ENCOUNTER — Emergency Department (HOSPITAL_COMMUNITY): Payer: Medicare Other

## 2017-03-14 ENCOUNTER — Encounter (HOSPITAL_COMMUNITY): Payer: Self-pay | Admitting: Vascular Surgery

## 2017-03-14 ENCOUNTER — Observation Stay (HOSPITAL_COMMUNITY)
Admission: EM | Admit: 2017-03-14 | Discharge: 2017-03-15 | Disposition: A | Discharge status: Home / Self Care | Payer: Medicare Other | Attending: Internal Medicine | Admitting: Internal Medicine

## 2017-03-14 DIAGNOSIS — R079 Chest pain, unspecified: Secondary | ICD-10-CM | POA: Diagnosis not present

## 2017-03-14 DIAGNOSIS — I10 Essential (primary) hypertension: Secondary | ICD-10-CM | POA: Diagnosis not present

## 2017-03-14 DIAGNOSIS — N184 Chronic kidney disease, stage 4 (severe): Secondary | ICD-10-CM | POA: Diagnosis not present

## 2017-03-14 DIAGNOSIS — M7989 Other specified soft tissue disorders: Secondary | ICD-10-CM | POA: Insufficient documentation

## 2017-03-14 DIAGNOSIS — R112 Nausea with vomiting, unspecified: Secondary | ICD-10-CM | POA: Diagnosis not present

## 2017-03-14 DIAGNOSIS — Z8249 Family history of ischemic heart disease and other diseases of the circulatory system: Secondary | ICD-10-CM | POA: Diagnosis not present

## 2017-03-14 DIAGNOSIS — I493 Ventricular premature depolarization: Secondary | ICD-10-CM | POA: Insufficient documentation

## 2017-03-14 DIAGNOSIS — Z833 Family history of diabetes mellitus: Secondary | ICD-10-CM | POA: Diagnosis not present

## 2017-03-14 DIAGNOSIS — I482 Chronic atrial fibrillation: Secondary | ICD-10-CM | POA: Insufficient documentation

## 2017-03-14 DIAGNOSIS — Z7952 Long term (current) use of systemic steroids: Secondary | ICD-10-CM | POA: Diagnosis not present

## 2017-03-14 DIAGNOSIS — Z6841 Body Mass Index (BMI) 40.0 and over, adult: Secondary | ICD-10-CM | POA: Diagnosis not present

## 2017-03-14 DIAGNOSIS — R197 Diarrhea, unspecified: Secondary | ICD-10-CM | POA: Diagnosis not present

## 2017-03-14 DIAGNOSIS — E1122 Type 2 diabetes mellitus with diabetic chronic kidney disease: Secondary | ICD-10-CM | POA: Diagnosis not present

## 2017-03-14 DIAGNOSIS — I129 Hypertensive chronic kidney disease with stage 1 through stage 4 chronic kidney disease, or unspecified chronic kidney disease: Secondary | ICD-10-CM | POA: Diagnosis not present

## 2017-03-14 DIAGNOSIS — D72829 Elevated white blood cell count, unspecified: Secondary | ICD-10-CM | POA: Insufficient documentation

## 2017-03-14 DIAGNOSIS — G4733 Obstructive sleep apnea (adult) (pediatric): Secondary | ICD-10-CM | POA: Diagnosis not present

## 2017-03-14 DIAGNOSIS — Z87891 Personal history of nicotine dependence: Secondary | ICD-10-CM | POA: Diagnosis not present

## 2017-03-14 DIAGNOSIS — E785 Hyperlipidemia, unspecified: Secondary | ICD-10-CM | POA: Insufficient documentation

## 2017-03-14 DIAGNOSIS — Z7951 Long term (current) use of inhaled steroids: Secondary | ICD-10-CM | POA: Insufficient documentation

## 2017-03-14 DIAGNOSIS — R55 Syncope and collapse: Principal | ICD-10-CM | POA: Diagnosis present

## 2017-03-14 DIAGNOSIS — R0602 Shortness of breath: Secondary | ICD-10-CM | POA: Diagnosis not present

## 2017-03-14 DIAGNOSIS — Z7901 Long term (current) use of anticoagulants: Secondary | ICD-10-CM | POA: Diagnosis not present

## 2017-03-14 DIAGNOSIS — M069 Rheumatoid arthritis, unspecified: Secondary | ICD-10-CM | POA: Insufficient documentation

## 2017-03-14 DIAGNOSIS — R0789 Other chest pain: Secondary | ICD-10-CM | POA: Diagnosis not present

## 2017-03-14 HISTORY — DX: Rheumatoid arthritis, unspecified: M06.9

## 2017-03-14 HISTORY — DX: Chronic kidney disease, stage 4 (severe): N18.4

## 2017-03-14 LAB — CBC WITH DIFFERENTIAL/PLATELET
BASOS PCT: 0 %
Basophils Absolute: 0 10*3/uL (ref 0.0–0.1)
EOS ABS: 0.1 10*3/uL (ref 0.0–0.7)
EOS PCT: 1 %
HCT: 30.4 % — ABNORMAL LOW (ref 36.0–46.0)
Hemoglobin: 9.3 g/dL — ABNORMAL LOW (ref 12.0–15.0)
LYMPHS ABS: 2.5 10*3/uL (ref 0.7–4.0)
Lymphocytes Relative: 19 %
MCH: 27.2 pg (ref 26.0–34.0)
MCHC: 30.6 g/dL (ref 30.0–36.0)
MCV: 88.9 fL (ref 78.0–100.0)
Monocytes Absolute: 0.4 10*3/uL (ref 0.1–1.0)
Monocytes Relative: 3 %
Neutro Abs: 10.3 10*3/uL — ABNORMAL HIGH (ref 1.7–7.7)
Neutrophils Relative %: 77 %
PLATELETS: 374 10*3/uL (ref 150–400)
RBC: 3.42 MIL/uL — AB (ref 3.87–5.11)
RDW: 18.1 % — ABNORMAL HIGH (ref 11.5–15.5)
WBC: 13.3 10*3/uL — AB (ref 4.0–10.5)

## 2017-03-14 LAB — COMPREHENSIVE METABOLIC PANEL
ALT: 15 U/L (ref 14–54)
ANION GAP: 7 (ref 5–15)
AST: 17 U/L (ref 15–41)
Albumin: 2.9 g/dL — ABNORMAL LOW (ref 3.5–5.0)
Alkaline Phosphatase: 50 U/L (ref 38–126)
BUN: 37 mg/dL — ABNORMAL HIGH (ref 6–20)
CALCIUM: 7.9 mg/dL — AB (ref 8.9–10.3)
CHLORIDE: 109 mmol/L (ref 101–111)
CO2: 26 mmol/L (ref 22–32)
Creatinine, Ser: 2.43 mg/dL — ABNORMAL HIGH (ref 0.44–1.00)
GFR calc non Af Amer: 19 mL/min — ABNORMAL LOW (ref >60)
GFR, EST AFRICAN AMERICAN: 22 mL/min — AB (ref >60)
Glucose, Bld: 135 mg/dL — ABNORMAL HIGH (ref 65–99)
Potassium: 4.1 mmol/L (ref 3.5–5.1)
SODIUM: 142 mmol/L (ref 135–145)
TOTAL PROTEIN: 5.9 g/dL — AB (ref 6.5–8.1)
Total Bilirubin: 0.3 mg/dL (ref 0.3–1.2)

## 2017-03-14 LAB — BRAIN NATRIURETIC PEPTIDE: B Natriuretic Peptide: 435.7 pg/mL — ABNORMAL HIGH (ref 0.0–100.0)

## 2017-03-14 LAB — GLUCOSE, CAPILLARY
GLUCOSE-CAPILLARY: 172 mg/dL — AB (ref 65–99)
GLUCOSE-CAPILLARY: 185 mg/dL — AB (ref 65–99)
Glucose-Capillary: 119 mg/dL — ABNORMAL HIGH (ref 65–99)

## 2017-03-14 LAB — MAGNESIUM: Magnesium: 1.5 mg/dL — ABNORMAL LOW (ref 1.7–2.4)

## 2017-03-14 LAB — TROPONIN I
Troponin I: <0.03 ng/mL (ref <0.03)
Troponin I: <0.03 ng/mL (ref <0.03)
Troponin I: <0.03 ng/mL (ref <0.03)

## 2017-03-14 LAB — PHOSPHORUS: Phosphorus: 3.5 mg/dL (ref 2.5–4.6)

## 2017-03-14 MED ORDER — CARVEDILOL 25 MG PO TABS
25.0000 mg | ORAL_TABLET | Freq: Two times a day (BID) | ORAL | Status: DC
  Administered 2017-03-14 – 2017-03-15 (×2): 25 mg via ORAL
  Filled 2017-03-14 (×2): qty 1

## 2017-03-14 MED ORDER — POTASSIUM CHLORIDE CRYS ER 20 MEQ PO TBCR
20.0000 meq | EXTENDED_RELEASE_TABLET | Freq: Every day | ORAL | Status: DC
  Administered 2017-03-14 – 2017-03-15 (×2): 20 meq via ORAL
  Filled 2017-03-14 (×2): qty 1

## 2017-03-14 MED ORDER — INSULIN ASPART 100 UNIT/ML ~~LOC~~ SOLN
0.0000 [IU] | Freq: Three times a day (TID) | SUBCUTANEOUS | Status: DC
Start: 2017-03-14 — End: 2017-03-14

## 2017-03-14 MED ORDER — RIVAROXABAN 15 MG PO TABS
15.0000 mg | ORAL_TABLET | Freq: Every day | ORAL | Status: DC
  Administered 2017-03-14: 15 mg via ORAL
  Filled 2017-03-14: qty 1

## 2017-03-14 MED ORDER — KCL IN DEXTROSE-NACL 20-5-0.45 MEQ/L-%-% IV SOLN
INTRAVENOUS | Status: DC
  Administered 2017-03-14: 19:00:00 via INTRAVENOUS
  Filled 2017-03-14 (×2): qty 1000

## 2017-03-14 MED ORDER — VITAMIN B-12 1000 MCG PO TABS
1000.0000 ug | ORAL_TABLET | Freq: Every day | ORAL | Status: DC
  Administered 2017-03-14 – 2017-03-15 (×2): 1000 ug via ORAL
  Filled 2017-03-14 (×2): qty 1

## 2017-03-14 MED ORDER — TIMOLOL MALEATE 0.5 % OP SOLN
1.0000 [drp] | Freq: Two times a day (BID) | OPHTHALMIC | Status: DC
  Administered 2017-03-14 – 2017-03-15 (×3): 1 [drp] via OPHTHALMIC
  Filled 2017-03-14: qty 5

## 2017-03-14 MED ORDER — HYDRALAZINE HCL 20 MG/ML IJ SOLN: 10.0000 mg | Freq: Three times a day (TID) | INTRAMUSCULAR | Status: DC | PRN

## 2017-03-14 MED ORDER — BRIMONIDINE TARTRATE 0.2 % OP SOLN
1.0000 [drp] | Freq: Two times a day (BID) | OPHTHALMIC | Status: DC
  Administered 2017-03-14 – 2017-03-15 (×3): 1 [drp] via OPHTHALMIC
  Filled 2017-03-14: qty 5

## 2017-03-14 MED ORDER — SIMVASTATIN 20 MG PO TABS
20.0000 mg | ORAL_TABLET | Freq: Every evening | ORAL | Status: DC
  Administered 2017-03-14: 20 mg via ORAL
  Filled 2017-03-14: qty 1

## 2017-03-14 MED ORDER — INSULIN ASPART 100 UNIT/ML ~~LOC~~ SOLN: 0.0000 [IU] | Freq: Three times a day (TID) | SUBCUTANEOUS | Status: DC

## 2017-03-14 MED ORDER — MORPHINE SULFATE (PF) 4 MG/ML IV SOLN: 2.0000 mg | INTRAVENOUS | Status: DC | PRN

## 2017-03-14 MED ORDER — ZOLPIDEM TARTRATE 5 MG PO TABS
5.0000 mg | ORAL_TABLET | Freq: Every evening | ORAL | Status: DC | PRN
  Administered 2017-03-14: 5 mg via ORAL
  Filled 2017-03-14: qty 1

## 2017-03-14 MED ORDER — FOLIC ACID 0.5 MG HALF TAB
500.0000 ug | ORAL_TABLET | Freq: Every day | ORAL | Status: DC
  Administered 2017-03-14 – 2017-03-15 (×2): 0.5 mg via ORAL
  Filled 2017-03-14 (×2): qty 1

## 2017-03-14 MED ORDER — NITROGLYCERIN 0.4 MG SL SUBL: 0.4000 mg | SUBLINGUAL_TABLET | SUBLINGUAL | Status: DC | PRN

## 2017-03-14 MED ORDER — ASPIRIN EC 325 MG PO TBEC
325.0000 mg | DELAYED_RELEASE_TABLET | Freq: Every day | ORAL | Status: DC
  Administered 2017-03-15: 325 mg via ORAL
  Filled 2017-03-14: qty 1

## 2017-03-14 MED ORDER — ONDANSETRON HCL 4 MG/2ML IJ SOLN: 4.0000 mg | Freq: Four times a day (QID) | INTRAMUSCULAR | Status: DC | PRN

## 2017-03-14 MED ORDER — NITROGLYCERIN 0.4 MG SL SUBL
SUBLINGUAL_TABLET | SUBLINGUAL | Status: AC
  Administered 2017-03-14: 0.4 mg
  Filled 2017-03-14: qty 1

## 2017-03-14 MED ORDER — LATANOPROST 0.005 % OP SOLN
1.0000 [drp] | Freq: Every day | OPHTHALMIC | Status: DC
  Administered 2017-03-14: 1 [drp] via OPHTHALMIC
  Filled 2017-03-14: qty 2.5

## 2017-03-14 MED ORDER — AMLODIPINE BESYLATE 10 MG PO TABS
10.0000 mg | ORAL_TABLET | Freq: Every day | ORAL | Status: DC
Start: 2017-03-14 — End: 2017-03-15
  Administered 2017-03-14 – 2017-03-15 (×2): 10 mg via ORAL
  Filled 2017-03-14 (×2): qty 1

## 2017-03-14 MED ORDER — HYDROCHLOROTHIAZIDE 25 MG PO TABS
25.0000 mg | ORAL_TABLET | Freq: Every day | ORAL | Status: DC
  Administered 2017-03-14 – 2017-03-15 (×2): 25 mg via ORAL
  Filled 2017-03-14 (×2): qty 1

## 2017-03-14 MED ORDER — ACETAMINOPHEN 325 MG PO TABS: 650.0000 mg | ORAL_TABLET | ORAL | Status: DC | PRN

## 2017-03-14 MED ORDER — LOSARTAN POTASSIUM 50 MG PO TABS
100.0000 mg | ORAL_TABLET | Freq: Every day | ORAL | Status: DC
  Administered 2017-03-14 – 2017-03-15 (×2): 100 mg via ORAL
  Filled 2017-03-14 (×2): qty 2

## 2017-03-14 MED ORDER — LOSARTAN POTASSIUM-HCTZ 100-25 MG PO TABS: 1.0000 | ORAL_TABLET | Freq: Every day | ORAL | Status: DC

## 2017-03-14 MED ORDER — BRIMONIDINE TARTRATE-TIMOLOL 0.2-0.5 % OP SOLN
1.0000 [drp] | Freq: Two times a day (BID) | OPHTHALMIC | Status: DC
  Filled 2017-03-14: qty 5

## 2017-03-14 MED ORDER — PREDNISONE 5 MG PO TABS
5.0000 mg | ORAL_TABLET | Freq: Every day | ORAL | Status: DC
  Administered 2017-03-14 – 2017-03-15 (×2): 5 mg via ORAL
  Filled 2017-03-14 (×2): qty 1

## 2017-03-14 NOTE — H&P (Signed)
[facility] History and Physical  [name] Melissa Bartlett:[id number] DOB: 11/14/1946 DOA: 03/14/2017  Referring physician:  PCP: Lynne Logan, MD   Chief Complaint: "I sat on the edge of the bed and that is the last thing I remember."  HPI: Melissa Bartlett is a 71 y.o. female  past mental history of rheumatoid arthritis, atrial fibrillation, some apnea, hypertension, diabetes, chronic kidney disease who presented with syncope. Patient states for a few days prior to coming to the emergency room for evaluation she had nausea vomiting and diarrhea. She felt ill and weak. Her cardiologist changed her Lasix dosing and she took a dose of Lasix the day before admission. Patient states that evening while in the bed she was ill filling. Set up on the side of the bed and then per her husband blacked out. Patient was unarousable for 4-5 minutes. Patient was diaphoretic this time. When patient awoke she was brought to the emergency room for evaluation.  ED course: Negative troponin. Negative EKG. Patient was better after aspirin from EMS. This was given for chest pain and was effective.   Review of Systems:  As per HPI otherwise 10 point review of systems negative.    Past Medical History:  Diagnosis Date  . CKD (chronic kidney disease), stage IV (Melissa Bartlett)   . Diabetes (Melissa Bartlett)   . HTN (hypertension)   . OSA (obstructive sleep apnea)    with AHI 11/hr with nocturnal hypoxemia   . PAF (paroxysmal atrial fibrillation) (Melissa Bartlett)    s/p DCCV in 8/17  . RA (rheumatoid arthritis) (Melissa Bartlett)    Past Surgical History:  Procedure Laterality Date  . ANKLE FUSION Right   . BREAST BIOPSY    . CARDIOVERSION N/A 08/15/2016   Procedure: CARDIOVERSION;  Surgeon: Jerline Pain, MD;  Location: [facility];  Service: Cardiovascular;  Laterality: N/A;  . GALLBLADDER SURGERY    . HYSTEROTOMY     Patrtial  . HYSTEROTOMY     Complete  . KNEE ARTHROSCOPY     Social History:  reports that she quit smoking about 11 months  ago. She has never used smokeless tobacco. She reports that she does not drink alcohol or use drugs.  Allergies  Allergen Reactions  . Enalapril Other (See Comments)    Systemic reaction - jerking  . Codeine Itching  . Oxycodone Itching  . Vicodin [Hydrocodone-Acetaminophen] Itching    Family History  Problem Relation Age of Onset  . Hypertension Mother     Family HX Diabetes  . Cancer Father     Stomach Cancer      Prior to Admission medications   Medication Sig Start Date End Date Taking? Authorizing Provider  amLODipine (NORVASC) 10 MG tablet Take 1 tablet (10 mg total) by mouth daily. 07/15/16  Yes Jerline Pain, MD  Calcium 600-400 MG-UNIT CHEW Chew 1 tablet by mouth 2 (two) times daily.   Yes Historical Provider, MD  carvedilol (COREG) 25 MG tablet Take 25 mg by mouth 2 (two) times daily with a meal.   Yes Historical Provider, MD  COMBIGAN 0.2-0.5 % ophthalmic solution 1 drop twice daily both eyes 01/29/14  Yes Historical Provider, MD  diphenhydramine-acetaminophen (TYLENOL PM) 25-500 MG TABS tablet Take 1 tablet by mouth at bedtime.    Yes Historical Provider, MD  fluticasone (FLONASE) 50 MCG/ACT nasal spray Place 2 sprays into the nose daily as needed for allergies.    Yes Historical Provider, MD  folic acid (FOLVITE) 562 MCG tablet Take 400 mcg  by mouth daily.   Yes Historical Provider, MD  furosemide (LASIX) 40 MG tablet Take 40 mg by mouth daily as needed for fluid.   Yes Historical Provider, MD  latanoprost (XALATAN) 0.005 % ophthalmic solution Place 1 drop into both eyes at bedtime.  04/26/16  Yes Historical Provider, MD  losartan-hydrochlorothiazide (HYZAAR) 100-25 MG tablet Take 1 tablet by mouth daily. 07/15/16  Yes Jerline Pain, MD  methotrexate (RHEUMATREX) 2.5 MG tablet Take 7.5 mg by mouth 2 (two) times a week. 7.5mg  only on Wednesdays and Thursdays -- Caution:Chemotherapy. Protect from light.   Yes Historical Provider, MD  potassium chloride SA (K-DUR,KLOR-CON) 20 MEQ  tablet Take 1 tablet (20 mEq total) by mouth daily. 07/15/16  Yes Jerline Pain, MD  predniSONE (DELTASONE) 10 MG tablet Take 5 mg by mouth daily.  12/18/16  Yes Historical Provider, MD  Rivaroxaban (XARELTO) 15 MG TABS tablet Take 1 tablet (15 mg total) by mouth daily with supper. 07/08/16  Yes Jerline Pain, MD  simvastatin (ZOCOR) 20 MG tablet Take 20 mg by mouth every evening. 06/21/16  Yes Historical Provider, MD  vitamin B-12 (CYANOCOBALAMIN) 1000 MCG tablet Take 1,000 mcg by mouth daily.   Yes Historical Provider, MD   Physical Exam: Vitals:   03/14/17 0916 03/14/17 0920 03/14/17 1030 03/14/17 1100  BP:  (!) 147/73 (!) 145/98 (!) 145/80  Pulse:  (!) 103 (!) 127 98  Resp:  17 (!) 21 (!) 23  Temp:  99.7 F (37.6 C)    TempSrc:  Oral    SpO2:  96% 98% 100%  Weight: 108.9 kg (240 lb)     Height: 5\' 2"  (1.575 m)       Wt Readings from Last 3 Encounters:  03/14/17 108.9 kg (240 lb)  01/11/17 107 kg (236 lb)  01/05/17 110.5 kg (243 lb 9.6 oz)    General:  Appears calm and comfortable, Alert and oriented 3, dehydrated Eyes:  PERRL, EOMI, normal lids, iris ENT:  grossly normal hearing, lips & tongue Neck:  no LAD, masses or thyromegaly Cardiovascular:  RRR, no m/r/g. No LE edema.  Respiratory:  CTA bilaterally, no w/r/r. Normal respiratory effort. Abdomen:  soft, ntnd Skin:  no rash or induration seen on limited exam Musculoskeletal:  grossly normal tone BUE/BLE Psychiatric:  grossly normal mood and affect, speech fluent and appropriate Neurologic:  CN 2-12 grossly intact, moves all extremities in coordinated fashion.          Labs on Admission:  Basic Metabolic Panel:  Recent Labs Lab 03/14/17 0933  NA 142  K 4.1  CL 109  CO2 26  GLUCOSE 135*  BUN 37*  CREATININE 2.43*  CALCIUM 7.9*   Liver Function Tests:  Recent Labs Lab 03/14/17 0933  AST 17  ALT 15  ALKPHOS 50  BILITOT 0.3  PROT 5.9*  ALBUMIN 2.9*   No results for input(s): LIPASE, AMYLASE in the  last 168 hours. No results for input(s): AMMONIA in the last 168 hours. CBC:  Recent Labs Lab 03/14/17 0933  WBC 13.3*  NEUTROABS 10.3*  HGB 9.3*  HCT 30.4*  MCV 88.9  PLT 374   Cardiac Enzymes:  Recent Labs Lab 03/14/17 0933  TROPONINI <0.03    BNP (last 3 results)  Recent Labs  03/14/17 0933  BNP 435.7*    ProBNP (last 3 results) No results for input(s): PROBNP in the last 8760 hours.   Serum creatinine: 2.43 mg/dL (H) 03/14/17 0933 Estimated creatinine clearance: 25  mL/min (A)  CBG: No results for input(s): GLUCAP in the last 168 hours.  Radiological Exams on Admission: Dg Chest 2 View  Result Date: 03/14/2017 CLINICAL DATA:  Shortness of breath. EXAM: CHEST  2 VIEW COMPARISON:  January 03, 2014 FINDINGS: Cardiomegaly. Mild pulmonary edema. Small right effusion with fluid in the fissures. No other interval changes or acute abnormalities. IMPRESSION: Cardiomegaly with mild edema and a small right effusion. Electronically Signed   By: Dorise Bullion III M.D   On: 03/14/2017 10:05    EKG: Independently reviewed. No STEMI  Assessment/Plan Principal Problem:   Syncope Active Problems:   Essential hypertension   CKD (chronic kidney disease) stage 4, GFR 15-29 ml/min (HCC)   Chest pain   1) CP/syncope Given hear score of 5 in ED. - serial trop ordered, initial neg - prn EKG CP - prn moprhine CP - prn ntg cp - asa in ED and QD - ECHO ordered for AM - tele bed, cardiac monitoring - ambien for sleep prn - zofran prn for nausea Syncope likely due to dehydration based on history  Atrial fibrillation Continue Coreg and Xarelto  Rheumatoid arthritis Hold by weekly methotrexate Continue prednisone, folic acid  Hypertension When necessary hydralazine 10 mg IV as needed for severe blood pressure Continue Norvasc, potassium and Hyzaar  DM SSI  Glaucoma Continue Xalatan and Combigan  Lower extremity swelling Hold as needed  Lasix  Hyperlipidemia Continue statin  CKD Monitor Cr daily Cr at baseline,  2.2   Code Status: FULL  DVT Prophylaxis: Xarelto Family Communication: husband at bedside Disposition Plan: Pending Improvement  Status: obs tele  Elwin Mocha, MD Family Medicine [facility] [email or web address] Password [email or web address]

## 2017-03-14 NOTE — ED Provider Notes (Signed)
[facility] DEPT Provider Note   CSN: [id number] Arrival date & time: 03/14/17  3785     History   Chief Complaint Chief Complaint  Patient presents with  . Near Syncope  . Chest Pain    HPI Melissa Bartlett is a 71 y.o. female.  HPI    A few days ago had CP in living room, then again last night, daughter gave her a prilosec then she went to sleep. Then woke up this AM with same, was intense, tried gingerale which didn't help then woke up this morning around 430AM, with pressure in the chest.  Sat on side of bed and felt nauseated and diaphoretic and had syncope, was out for 4-5 minutes.  After waking also had CP, dyspnea, nausea, diaphoresis as well.  Went back to sleep then woke up again just prior to arrival with CP.  Worse with exertion.  Pain improved with aspirin on way in.  No current chest pain.  Had stress test 4 yrs ago.  No prior hx of CAD.  No hx of DVT/PE.     Past Medical History:  Diagnosis Date  . CKD (chronic kidney disease), stage IV ([place])   . Diabetes ([place])   . HTN (hypertension)   . OSA (obstructive sleep apnea)    with AHI 11/hr with nocturnal hypoxemia   . PAF (paroxysmal atrial fibrillation) ([place])    s/p DCCV in 8/17  . RA (rheumatoid arthritis) [facility])     Patient Active Problem List   Diagnosis Date Noted  . Syncope 03/14/2017  . Chest pain 03/14/2017  . OSA (obstructive sleep apnea)   . Paroxysmal atrial fibrillation ([place]) 07/31/2016  . CKD (chronic kidney disease) stage 4, GFR 15-29 ml/min (H[facility]) 07/31/2016  . Essential hypertension 07/09/2015  . Morbid obesity ([place]) 07/09/2015  . Diabetes ([place]) 03/07/2014    Past Surgical History:  Procedure Laterality Date  . ANKLE FUSION Right   . BREAST BIOPSY    . CARDIOVERSION N/A 08/15/2016   Procedure: CARDIOVERSION;  Surgeon: Jerline Pain, MD;  Location: [facility];  Service: Cardiovascular;  Laterality: N/A;  . GALLBLADDER SURGERY    . HYSTEROTOMY     Patrtial  . HYSTEROTOMY     Complete  . KNEE ARTHROSCOPY      OB History    No data available       Home Medications    Prior to Admission medications   Medication Sig Start Date End Date Taking? Authorizing Provider  amLODipine (NORVASC) 10 MG tablet Take 1 tablet (10 mg total) by mouth daily. 07/15/16  Yes Jerline Pain, MD  Calcium 600-400 MG-UNIT CHEW Chew 1 tablet by mouth 2 (two) times daily.   Yes Historical Provider, MD  carvedilol (COREG) 25 MG tablet Take 25 mg by mouth 2 (two) times daily with a meal.   Yes Historical Provider, MD  COMBIGAN 0.2-0.5 % ophthalmic solution 1 drop twice daily both eyes 01/29/14  Yes Historical Provider, MD  diphenhydramine-acetaminophen (TYLENOL PM) 25-500 MG TABS tablet Take 1 tablet by mouth at bedtime.    Yes Historical Provider, MD  fluticasone (FLONASE) 50 MCG/ACT nasal spray Place 2 sprays into the nose daily as needed for allergies.    Yes Historical Provider, MD  folic acid (FOLVITE) 885 MCG tablet Take 400 mcg by mouth daily.   Yes Historical Provider, MD  furosemide (LASIX) 40 MG tablet Take 40 mg by mouth daily as needed for fluid.   Yes Historical Provider, MD  latanoprost (XALATAN) 0.005 % ophthalmic solution Place 1 drop into both eyes at bedtime.  04/26/16  Yes Historical Provider, MD  losartan-hydrochlorothiazide (HYZAAR) 100-25 MG tablet Take 1 tablet by mouth daily. 07/15/16  Yes Jerline Pain, MD  methotrexate (RHEUMATREX) 2.5 MG tablet Take 7.5 mg by mouth 2 (two) times a week. 7.5mg  only on Wednesdays and Thursdays -- Caution:Chemotherapy. Protect from light.   Yes Historical Provider, MD  potassium chloride SA (K-DUR,KLOR-CON) 20 MEQ tablet Take 1 tablet (20 mEq total) by mouth daily. 07/15/16  Yes Jerline Pain, MD  predniSONE (DELTASONE) 10 MG tablet Take 5 mg by mouth daily.  12/18/16  Yes Historical Provider, MD  Rivaroxaban (XARELTO) 15 MG TABS tablet Take 1 tablet (15 mg total) by mouth daily with supper. 07/08/16  Yes Jerline Pain, MD  simvastatin  (ZOCOR) 20 MG tablet Take 20 mg by mouth every evening. 06/21/16  Yes Historical Provider, MD  vitamin B-12 (CYANOCOBALAMIN) 1000 MCG tablet Take 1,000 mcg by mouth daily.   Yes Historical Provider, MD    Family History Family History  Problem Relation Age of Onset  . Hypertension Mother     Family HX Diabetes  . Cancer Father     Stomach Cancer     Social History Social History  Substance Use Topics  . Smoking status: Former Smoker    Quit date: 04/08/2016  . Smokeless tobacco: Never Used  . Alcohol use No     Allergies   Enalapril; Codeine; Oxycodone; and Vicodin [hydrocodone-acetaminophen]   Review of Systems Review of Systems  Constitutional: Positive for diaphoresis. Negative for fever.  HENT: Negative for congestion and sore throat.   Eyes: Negative for visual disturbance.  Respiratory: Positive for shortness of breath. Negative for cough.   Cardiovascular: Positive for chest pain. Negative for leg swelling.  Gastrointestinal: Positive for nausea. Negative for abdominal pain, diarrhea and vomiting.  Genitourinary: Negative for difficulty urinating.  Musculoskeletal: Negative for back pain and neck pain.  Skin: Negative for rash.  Neurological: Positive for syncope and light-headedness. Negative for weakness, numbness and headaches.     Physical Exam Updated Vital Signs BP (!) 167/82 (BP Location: Left Arm)   Pulse 72   Temp 98.7 F (37.1 C) (Oral)   Resp 18   Ht 5\' 2"  (1.575 m)   Wt 240 lb (108.9 kg)   LMP  (LMP Unknown)   SpO2 98%   BMI 43.90 kg/m   Physical Exam  Constitutional: She is oriented to person, place, and time. She appears well-developed and well-nourished. No distress.  HENT:  Head: Normocephalic and atraumatic.  Eyes: Conjunctivae and EOM are normal.  Neck: Normal range of motion.  Cardiovascular: Normal rate, regular rhythm, normal heart sounds and intact distal pulses.  Exam reveals no gallop and no friction rub.   No murmur  heard. Pulmonary/Chest: Effort normal. No respiratory distress. She has decreased breath sounds (RLL). She has no wheezes. She has no rales.  Abdominal: Soft. She exhibits no distension. There is no tenderness. There is no guarding.  Musculoskeletal: She exhibits no edema or tenderness.  Neurological: She is alert and oriented to person, place, and time.  Skin: Skin is warm and dry. No rash noted. She is not diaphoretic. No erythema.  Nursing note and vitals reviewed.    ED Treatments / Results  Labs (all labs ordered are listed, but only abnormal results are displayed) Labs Reviewed  CBC WITH DIFFERENTIAL/PLATELET - Abnormal; Notable for the following:  Result Value   WBC 13.3 (*)    RBC 3.42 (*)    Hemoglobin 9.3 (*)    HCT 30.4 (*)    RDW 18.1 (*)    Neutro Abs 10.3 (*)    All other components within normal limits  COMPREHENSIVE METABOLIC PANEL - Abnormal; Notable for the following:    Glucose, Bld 135 (*)    BUN 37 (*)    Creatinine, Ser 2.43 (*)    Calcium 7.9 (*)    Total Protein 5.9 (*)    Albumin 2.9 (*)    GFR calc non Af Amer 19 (*)    GFR calc Af Amer 22 (*)    All other components within normal limits  BRAIN NATRIURETIC PEPTIDE - Abnormal; Notable for the following:    B Natriuretic Peptide 435.7 (*)    All other components within normal limits  MAGNESIUM - Abnormal; Notable for the following:    Magnesium 1.5 (*)    All other components within normal limits  GLUCOSE, CAPILLARY - Abnormal; Notable for the following:    Glucose-Capillary 119 (*)    All other components within normal limits  GLUCOSE, CAPILLARY - Abnormal; Notable for the following:    Glucose-Capillary 172 (*)    All other components within normal limits  TROPONIN I  TROPONIN I  TROPONIN I  PHOSPHORUS  TROPONIN I    EKG  EKG Interpretation  Date/Time:  Saturday March 14 2017 09:20:11 EDT Ventricular Rate:  97 PR Interval:    QRS Duration: 73 QT Interval:  365 QTC  Calculation: 464 R Axis:   56 Text Interpretation:  Atrial fibrillation Low voltage, extremity and precordial leads Consider anterior infarct Since prior ECG, rate has increased No other significant changes Confirmed by [facility] MD, Maisa Bedingfield ([id number]) on 03/14/2017 10:51:07 AM       Radiology Dg Chest 2 View  Result Date: 03/14/2017 CLINICAL DATA:  Shortness of breath. EXAM: CHEST  2 VIEW COMPARISON:  January 03, 2014 FINDINGS: Cardiomegaly. Mild pulmonary edema. Small right effusion with fluid in the fissures. No other interval changes or acute abnormalities. IMPRESSION: Cardiomegaly with mild edema and a small right effusion. Electronically Signed   By: Dorise Bullion III M.D   On: 03/14/2017 10:05    Procedures Procedures (including critical care time)  Medications Ordered in ED Medications  folic acid (FOLVITE) tablet 0.5 mg (0.5 mg Oral Given 03/14/17 1412)  predniSONE (DELTASONE) tablet 5 mg (5 mg Oral Given 03/14/17 1412)  vitamin B-12 (CYANOCOBALAMIN) tablet 1,000 mcg (1,000 mcg Oral Given 03/14/17 1413)  carvedilol (COREG) tablet 25 mg (25 mg Oral Given 03/14/17 1645)  amLODipine (NORVASC) tablet 10 mg (10 mg Oral Given 03/14/17 1412)  potassium chloride SA (K-DUR,KLOR-CON) CR tablet 20 mEq (20 mEq Oral Given 03/14/17 1413)  latanoprost (XALATAN) 0.005 % ophthalmic solution 1 drop (not administered)  Rivaroxaban (XARELTO) tablet 15 mg (15 mg Oral Given 03/14/17 1645)  simvastatin (ZOCOR) tablet 20 mg (20 mg Oral Given 03/14/17 1828)  acetaminophen (TYLENOL) tablet 650 mg (not administered)  ondansetron (ZOFRAN) injection 4 mg (not administered)  morphine 4 MG/ML injection 2 mg (not administered)  aspirin EC tablet 325 mg (not administered)  zolpidem (AMBIEN) tablet 5 mg (not administered)  hydrALAZINE (APRESOLINE) injection 10 mg (not administered)  nitroGLYCERIN (NITROSTAT) SL tablet 0.4 mg (not administered)  dextrose 5 % and 0.45 % NaCl with KCl 20 mEq/L infusion ( Intravenous New  Bag/Given 03/14/17 1830)  losartan (COZAAR) tablet 100 mg (100 mg  Oral Given 03/14/17 1413)    And  hydrochlorothiazide (HYDRODIURIL) tablet 25 mg (25 mg Oral Given 03/14/17 1413)  brimonidine (ALPHAGAN) 0.2 % ophthalmic solution 1 drop (1 drop Both Eyes Given 03/14/17 1647)    And  timolol (TIMOPTIC) 0.5 % ophthalmic solution 1 drop (1 drop Both Eyes Given 03/14/17 1647)  insulin aspart (novoLOG) injection 0-9 Units (not administered)  nitroGLYCERIN (NITROSTAT) 0.4 MG SL tablet (0.4 mg  Given 03/14/17 1245)     Initial Impression / Assessment and Plan / ED Course  I have reviewed the triage vital signs and the nursing notes.  Pertinent labs & imaging results that were available during my care of the patient were reviewed by me and considered in my medical decision making (see chart for details).     71 year old female with a history of hypertension, atrial fibrillation on Xarelto, diabetes, obstructive sleep apnea, CK D stage IV who presents with concern for chest pain and near-syncope.  Differential diagnosis for chest pain includes pulmonary embolus, dissection, pneumothorax, pneumonia, ACS, myocarditis, pericarditis.  EKG was done and evaluate by me and showed no acute ST changes and no signs of pericarditis and did show afib. Chest x-ray was done and evaluated by me and radiology and showed no sign of pneumonia or pneumothorax. XR shows pleural effusion. Patient is low risk [place], and on xarelto, and have low suspicion for PE.  She has equal pulses bilaterally, normal mediastinum and doubt dissection.   Patient high risk HEART score with CP, diaphoresis, nausea, and syncope. Will admit for further observation and care.   Final Clinical Impressions(s) / ED Diagnoses   Final diagnoses:  Chest pain, unspecified type  Syncope and collapse    New Prescriptions Current Discharge Medication List       Gareth Morgan, MD 03/14/17 [phone]

## 2017-03-14 NOTE — ED Triage Notes (Signed)
Pt arrives via [facility] coming from home for eval of possible syncope and CP. She remembers feeling some nausea and SOB. She thinks she may have had a possible syncopal episode. She remembers just sitting in the bed in the same spot. Her husband came in and noted her to be diaphoretic. At that time he attempted to call EMS but she stated she just wanted to rest. She slept for some time and when she woke up again she noted some midcentral chest pressure, SOB, and nausea. By the time EMS arrived her symptoms had resolved. VSS en route. 12 lead showed A. Fib. She received 324 of ASA PTA. She is on [employer].

## 2017-03-14 NOTE — Progress Notes (Signed)
MD paged to get an order for CPAP. RN notified as well.

## 2017-03-15 ENCOUNTER — Observation Stay (HOSPITAL_BASED_OUTPATIENT_CLINIC_OR_DEPARTMENT_OTHER): Payer: Medicare Other

## 2017-03-15 DIAGNOSIS — G4733 Obstructive sleep apnea (adult) (pediatric): Secondary | ICD-10-CM | POA: Diagnosis not present

## 2017-03-15 DIAGNOSIS — I493 Ventricular premature depolarization: Secondary | ICD-10-CM | POA: Diagnosis not present

## 2017-03-15 DIAGNOSIS — R072 Precordial pain: Secondary | ICD-10-CM | POA: Diagnosis not present

## 2017-03-15 DIAGNOSIS — R55 Syncope and collapse: Secondary | ICD-10-CM

## 2017-03-15 DIAGNOSIS — N184 Chronic kidney disease, stage 4 (severe): Secondary | ICD-10-CM | POA: Diagnosis not present

## 2017-03-15 DIAGNOSIS — I129 Hypertensive chronic kidney disease with stage 1 through stage 4 chronic kidney disease, or unspecified chronic kidney disease: Secondary | ICD-10-CM | POA: Diagnosis not present

## 2017-03-15 DIAGNOSIS — E1122 Type 2 diabetes mellitus with diabetic chronic kidney disease: Secondary | ICD-10-CM | POA: Diagnosis not present

## 2017-03-15 DIAGNOSIS — E785 Hyperlipidemia, unspecified: Secondary | ICD-10-CM | POA: Diagnosis not present

## 2017-03-15 DIAGNOSIS — R079 Chest pain, unspecified: Secondary | ICD-10-CM | POA: Diagnosis not present

## 2017-03-15 DIAGNOSIS — I482 Chronic atrial fibrillation: Secondary | ICD-10-CM | POA: Diagnosis not present

## 2017-03-15 DIAGNOSIS — Z6841 Body Mass Index (BMI) 40.0 and over, adult: Secondary | ICD-10-CM | POA: Diagnosis not present

## 2017-03-15 DIAGNOSIS — M069 Rheumatoid arthritis, unspecified: Secondary | ICD-10-CM | POA: Diagnosis not present

## 2017-03-15 LAB — BASIC METABOLIC PANEL
ANION GAP: 10 (ref 5–15)
BUN: 41 mg/dL — ABNORMAL HIGH (ref 6–20)
CALCIUM: 8 mg/dL — AB (ref 8.9–10.3)
CO2: 25 mmol/L (ref 22–32)
CREATININE: 2.39 mg/dL — AB (ref 0.44–1.00)
Chloride: 105 mmol/L (ref 101–111)
GFR calc Af Amer: 23 mL/min — ABNORMAL LOW (ref >60)
GFR, EST NON AFRICAN AMERICAN: 19 mL/min — AB (ref >60)
GLUCOSE: 177 mg/dL — AB (ref 65–99)
Potassium: 4.2 mmol/L (ref 3.5–5.1)
Sodium: 140 mmol/L (ref 135–145)

## 2017-03-15 LAB — GLUCOSE, CAPILLARY
Glucose-Capillary: 116 mg/dL — ABNORMAL HIGH (ref 65–99)
Glucose-Capillary: 158 mg/dL — ABNORMAL HIGH (ref 65–99)

## 2017-03-15 LAB — ECHOCARDIOGRAM COMPLETE
Height: 62 in
Weight: 3840 oz

## 2017-03-15 LAB — CBC
HEMATOCRIT: 29.9 % — AB (ref 36.0–46.0)
Hemoglobin: 8.9 g/dL — ABNORMAL LOW (ref 12.0–15.0)
MCH: 26.3 pg (ref 26.0–34.0)
MCHC: 29.8 g/dL — AB (ref 30.0–36.0)
MCV: 88.2 fL (ref 78.0–100.0)
PLATELETS: 324 10*3/uL (ref 150–400)
RBC: 3.39 MIL/uL — ABNORMAL LOW (ref 3.87–5.11)
RDW: 17.3 % — AB (ref 11.5–15.5)
WBC: 13.6 10*3/uL — ABNORMAL HIGH (ref 4.0–10.5)

## 2017-03-15 LAB — MAGNESIUM: Magnesium: 1.6 mg/dL — ABNORMAL LOW (ref 1.7–2.4)

## 2017-03-15 MED ORDER — MAGNESIUM SULFATE 2 GM/50ML IV SOLN
2.0000 g | Freq: Once | INTRAVENOUS | Status: AC
  Administered 2017-03-15: 2 g via INTRAVENOUS
  Filled 2017-03-15: qty 50

## 2017-03-15 MED ORDER — PERFLUTREN LIPID MICROSPHERE
INTRAVENOUS | Status: AC
  Administered 2017-03-15: 2 mL
  Filled 2017-03-15: qty 10

## 2017-03-15 MED ORDER — ALUM & MAG HYDROXIDE-SIMETH 200-200-20 MG/5ML PO SUSP: 30.0000 mL | ORAL | Status: DC | PRN

## 2017-03-15 MED ORDER — ASPIRIN 81 MG PO CHEW: 81.0000 mg | CHEWABLE_TABLET | Freq: Every day | ORAL | Status: DC

## 2017-03-15 MED ORDER — PERFLUTREN LIPID MICROSPHERE
1.0000 mL | INTRAVENOUS | Status: AC | PRN
  Filled 2017-03-15: qty 10

## 2017-03-15 NOTE — Progress Notes (Signed)
  Echocardiogram 2D Echocardiogram with Definity has been performed.  Tresa Res 03/15/2017, 1:58 PM

## 2017-03-15 NOTE — Discharge Instructions (Addendum)
Orthostatic Hypotension Orthostatic hypotension is a sudden drop in blood pressure that happens when you quickly change positions, such as when you get up from a seated or lying position. Blood pressure is a measurement of how strongly, or weakly, your blood is pressing against the walls of your arteries. Arteries are blood vessels that carry blood from your heart throughout your body. When blood pressure is too low, you may not get enough blood to your brain or to the rest of your organs. This can cause weakness, light-headedness, rapid heartbeat, and fainting. This can last for just a few seconds or for up to a few minutes. Orthostatic hypotension is usually not a serious problem. However, if it happens frequently or gets worse, it may be a sign of something more serious. What are the causes? This condition may be caused by:  Sudden changes in posture, such as standing up quickly after you have been sitting or lying down.  Blood loss.  Loss of body fluids (dehydration).  Heart problems.  Hormone (endocrine) problems.  Pregnancy.  Severe infection.  Lack of certain nutrients.  Severe allergic reactions (anaphylaxis).  Certain medicines, such as blood pressure medicine or medicines that make the body lose excess fluids (diuretics). Sometimes, this condition can be caused by not taking medicine as directed, such as taking too much of a certain medicine. What increases the risk? Certain factors can make you more likely to develop orthostatic hypotension, including:  Age. Risk increases as you get older.  Conditions that affect the heart or the central nervous system.  Taking certain medicines, such as blood pressure medicine or diuretics.  Being pregnant. What are the signs or symptoms? Symptoms of this condition may include:  Weakness.  Light-headedness.  Dizziness.  Blurred vision.  Fatigue.  Rapid heartbeat.  Fainting, in severe cases. How is this diagnosed? This  condition is diagnosed based on:  Your medical history.  Your symptoms.  Your blood pressure measurement. Your health care provider will check your blood pressure when you are:  Lying down.  Sitting.  Standing. A blood pressure reading is recorded as two numbers, such as "120 over 80" (or 120/80). The first ("top") number is called the systolic pressure. It is a measure of the pressure in your arteries as your heart beats. The second ("bottom") number is called the diastolic pressure. It is a measure of the pressure in your arteries when your heart relaxes between beats. Blood pressure is measured in a unit called mm Hg. Healthy blood pressure for adults is 120/80. If your blood pressure is below 90/60, you may be diagnosed with hypotension. Other information or tests that may be used to diagnose orthostatic hypotension include:  Your other vital signs, such as your heart rate and temperature.  Blood tests.  Tilt table test. For this test, you will be safely secured to a table that moves you from a lying position to an upright position. Your heart rhythm and blood pressure will be monitored during the test. How is this treated? Treatment for this condition may include:  Changing your diet. This may involve eating more salt (sodium) or drinking more water.  Taking medicines to raise your blood pressure.  Changing the dosage of certain medicines you are taking that might be lowering your blood pressure.  Wearing compression stockings. These stockings help to prevent blood clots and reduce swelling in your legs. In some cases, you may need to go to the hospital for:  Fluid replacement. This means you will  receive fluids through an IV tube.  Blood replacement. This means you will receive donated blood through an IV tube (transfusion).  Treating an infection or heart problems, if this applies.  Monitoring. You may need to be monitored while medicines that you are taking wear  off. Follow these instructions at home: Eating and drinking    Drink enough fluid to keep your urine clear or pale yellow.  Eat a healthy diet and follow instructions from your health care provider about eating or drinking restrictions. A healthy diet includes:  Fresh fruits and vegetables.  Whole grains.  Lean meats.  Low-fat dairy products.  Eat extra salt only as directed. Do not add extra salt to your diet unless your health care provider told you to do that.  Eat frequent, small meals.  Avoid standing up suddenly after eating. Medicines   Take over-the-counter and prescription medicines only as told by your health care provider.  Follow instructions from your health care provider about changing the dosage of your current medicines, if this applies.  Do not stop or adjust any of your medicines on your own. General instructions   Wear compression stockings as told by your health care provider.  Get up slowly from lying down or sitting positions. This gives your blood pressure a chance to adjust.  Avoid hot showers and excessive heat as directed by your health care provider.  Return to your normal activities as told by your health care provider. Ask your health care provider what activities are safe for you.  Do not use any products that contain nicotine or tobacco, such as cigarettes and e-cigarettes. If you need help quitting, ask your health care provider.  Keep all follow-up visits as told by your health care provider. This is important. Contact a health care provider if:  You vomit.  You have diarrhea.  You have a fever for more than 2-3 days.  You feel more thirsty than usual.  You feel weak and tired. Get help right away if:  You have chest pain.  You have a fast or irregular heartbeat.  You develop numbness in any part of your body.  You cannot move your arms or your legs.  You have trouble speaking.  You become sweaty or feel  lightheaded.  You faint.  You feel short of breath.  You have trouble staying awake.  You feel confused. This information is not intended to replace advice given to you by your health care provider. Make sure you discuss any questions you have with your health care provider. Document Released: 12/05/2002 Document Revised: 09/02/2016 Document Reviewed: 06/06/2016 Elsevier Interactive Patient Education  2017 [place] on my medicine - XARELTO (Rivaroxaban)  This medication education was reviewed with me or my healthcare representative as part of my discharge preparation.  Why was Xarelto prescribed for you? Xarelto was prescribed for you to reduce the risk of a blood clot forming that can cause a stroke if you have a medical condition called atrial fibrillation (a type of irregular heartbeat).  What do you need to know about xarelto ? Take your Xarelto ONCE DAILY at the same time every day with your evening meal. If you have difficulty swallowing the tablet whole, you may crush it and mix in applesauce just prior to taking your dose.  Take Xarelto exactly as prescribed by your doctor and DO NOT stop taking Xarelto without talking to the doctor who prescribed the medication.  Stopping without other stroke prevention medication  to take the place of Xarelto may increase your risk of developing a clot that causes a stroke.  Refill your prescription before you run out.  After discharge, you should have regular check-up appointments with your healthcare provider that is prescribing your Xarelto.  In the future your dose may need to be changed if your kidney function or weight changes by a significant amount.  What do you do if you miss a dose? If you are taking Xarelto ONCE DAILY and you miss a dose, take it as soon as you remember on the same day then continue your regularly scheduled once daily regimen the next day. Do not take two doses of Xarelto at the same  time or on the same day.   Important Safety Information A possible side effect of Xarelto is bleeding. You should call your healthcare provider right away if you experience any of the following: ? Bleeding from an injury or your nose that does not stop. ? Unusual colored urine (red or dark brown) or unusual colored stools (red or black). ? Unusual bruising for unknown reasons. ? A serious fall or if you hit your head (even if there is no bleeding).  Some medicines may interact with Xarelto and might increase your risk of bleeding while on Xarelto. To help avoid this, consult your healthcare provider or pharmacist prior to using any new prescription or non-prescription medications, including herbals, vitamins, non-steroidal anti-inflammatory drugs (NSAIDs) and supplements.  This website has more information on Xarelto: [email or web address]-benson.[email or web address]

## 2017-03-15 NOTE — Care Management Obs Status (Signed)
[place] NOTIFICATION   Patient Details  Name: Melissa Bartlett MRN: [id number] Date of Birth: 06/28/1946   Medicare Observation Status Notification Given:  Yes    Maryclare Labrador, RN 03/15/2017, 11:59 AM

## 2017-03-15 NOTE — Discharge Summary (Signed)
Physician Discharge Summary  Melissa Bartlett JQZ:[id number] DOB: 09-20-1946 DOA: 03/14/2017  PCP: Melissa Logan, MD  Admit date: 03/14/2017 Discharge date: 03/15/2017  Admitted From: Home Disposition:  Home  Recommendations for Outpatient Follow-up:  1. Follow up with PCP in 1 week 2. Please obtain BMP/CBC/Mg in 1 week   Home Health: No  Equipment/Devices: None   Discharge Condition: Stable CODE STATUS: Full  Diet recommendation: Heart healthy  Brief/Interim Summary: Melissa Tandy Pearceis a 71 y.o.femalepast mental history of rheumatoid arthritis, atrial fibrillation, sleep apnea, hypertension, diabetes, chronic kidney disease who presented with syncope. Patient states for a few days prior to coming to the emergency room for evaluation she had nausea vomiting and diarrhea. She felt ill and weak. She has been on prednisone for RA and has gained about 15 lbs. Her PCP had placed her on lasix 40mg  daily and she lost 15 lbs of fluid. Then her cardiologist changed her Lasix dosing to as needed. On day of admission, patient states that she was getting out of bed, sat up, felt ill like she was going to throw up, and passed out for 4-5 minutes. Upon awaking, she was not confused, no report of post-ictal change. She then sat in a chair and had some chest pressure which resolved after getting aspirin. Patient was admitted for further workup of chest pain as well as syncope. Troponin was trended which was negative. EKG showed atrial fibrillation without any acute ST-T change. Echocardiogram was unremarkable. Orthostatic vital sign was negative. Telemetry was reviewed which showed atrial fibrillation with infrequent PVC. Patient is discharged in good condition. No further episodes of syncope during hospitalization. Patient to continue to stay hydrated, keep an eye on her weight, follow-up with primary care for repeat labs in one week.   Discharge Diagnoses:  Principal Problem:   Syncope Active  Problems:   Essential hypertension   CKD (chronic kidney disease) stage 4, GFR 15-29 ml/min (HCC)   Chest pain  Syncope -Could be secondary to orthostasis, episode of syncope when getting out of bed, patient with recent GI illness with nausea/vomiting/diarrhea and taking lasix  -Orthostatic vital sign negative on day of discharge  -No change in outpatient medication. Patient to continue to stay hydrated, monitor weight, follow up outpatient for repeat lab work   Chest pain -Episode of chest pressure after syncopal episode -Currently chest pain-free -Troponin x 3 negative  -EKG reviewed independently. A Fib, low voltage, no acute ST change  -CXR reviewed independently. Small right pleural effusion with fluid in fissure, no appreciable infiltrate  -Telemetry obscured by motion artifact, shows A Fib with infrequent PVC  -Echocardiogram with EF 65-70% no regional wall motion abnormalities   Chronic atrial fibrillation -Continue Coreg and Xarelto  Rheumatoid arthritis -Methotrexate weekly -Cont prednisone, folic acid  Essential hypertension -Continue Norvasc, Cozaar/HCTZ   DM -SSI  Hyperlipidemia -Continue statin  CKD stage 4 -Cr baseline 2.2 -Stable, monitor BMP   OSA -CPAP qhs   Leukocytosis -Without infectious etiology, could be secondary to chronic prednisone use -Monitor  Hypomagnesemia -Replace, trend    Discharge Instructions  Discharge Instructions    Call MD for:    Complete by:  As directed    Recurrent chest pain or syncopal episode   Call MD for:  difficulty breathing, headache or visual disturbances    Complete by:  As directed    Call MD for:  extreme fatigue    Complete by:  As directed    Call MD for:  persistant dizziness  or light-headedness    Complete by:  As directed    Call MD for:  persistant nausea and vomiting    Complete by:  As directed    Diet - low sodium heart healthy    Complete by:  As directed    Increase activity  slowly    Complete by:  As directed      Allergies as of 03/15/2017      Reactions   Enalapril Other (See Comments)   Systemic reaction - jerking   Codeine Itching   Oxycodone Itching   Vicodin [hydrocodone-acetaminophen] Itching      Medication List    TAKE these medications   amLODipine 10 MG tablet Commonly known as:  NORVASC Take 1 tablet (10 mg total) by mouth daily.   Calcium 600-400 MG-UNIT Chew Chew 1 tablet by mouth 2 (two) times daily.   carvedilol 25 MG tablet Commonly known as:  COREG Take 25 mg by mouth 2 (two) times daily with a meal.   COMBIGAN 0.2-0.5 % ophthalmic solution Generic drug:  brimonidine-timolol 1 drop twice daily both eyes   diphenhydramine-acetaminophen 25-500 MG Tabs tablet Commonly known as:  TYLENOL PM Take 1 tablet by mouth at bedtime.   fluticasone 50 MCG/ACT nasal spray Commonly known as:  FLONASE Place 2 sprays into the nose daily as needed for allergies.   folic acid 478 MCG tablet Commonly known as:  FOLVITE Take 400 mcg by mouth daily.   furosemide 40 MG tablet Commonly known as:  LASIX Take 40 mg by mouth daily as needed for fluid.   latanoprost 0.005 % ophthalmic solution Commonly known as:  XALATAN Place 1 drop into both eyes at bedtime.   losartan-hydrochlorothiazide 100-25 MG tablet Commonly known as:  HYZAAR Take 1 tablet by mouth daily.   methotrexate 2.5 MG tablet Commonly known as:  RHEUMATREX Take 7.5 mg by mouth 2 (two) times a week. 7.5mg  only on Wednesdays and Thursdays -- Caution:Chemotherapy. Protect from light.   potassium chloride SA 20 MEQ tablet Commonly known as:  K-DUR,KLOR-CON Take 1 tablet (20 mEq total) by mouth daily.   predniSONE 10 MG tablet Commonly known as:  DELTASONE Take 5 mg by mouth daily.   Rivaroxaban 15 MG Tabs tablet Commonly known as:  XARELTO Take 1 tablet (15 mg total) by mouth daily with supper.   simvastatin 20 MG tablet Commonly known as:  ZOCOR Take 20 mg by  mouth every evening.   vitamin B-12 1000 MCG tablet Commonly known as:  CYANOCOBALAMIN Take 1,000 mcg by mouth daily.      Follow-up Information    Melissa Logan, MD. Schedule an appointment as soon as possible for a visit in 1 week(s).   Specialty:  Family Medicine Contact information: [place] [address] [phone]          Allergies  Allergen Reactions  . Enalapril Other (See Comments)    Systemic reaction - jerking  . Codeine Itching  . Oxycodone Itching  . Vicodin [Hydrocodone-Acetaminophen] Itching    Consultations:  None   Procedures/Studies: Dg Chest 2 View  Result Date: 03/14/2017 CLINICAL DATA:  Shortness of breath. EXAM: CHEST  2 VIEW COMPARISON:  January 03, 2014 FINDINGS: Cardiomegaly. Mild pulmonary edema. Small right effusion with fluid in the fissures. No other interval changes or acute abnormalities. IMPRESSION: Cardiomegaly with mild edema and a small right effusion. Electronically Signed   By: Dorise Bullion III M.D   On: 03/14/2017 10:05  Echo  Study Conclusions  - Left ventricle: The cavity size was normal. There was mild   concentric hypertrophy. Systolic function was vigorous. The   estimated ejection fraction was in the range of 65% to 70%. Wall   motion was normal; there were no regional wall motion   abnormalities. Doppler parameters are consistent with high   ventricular filling pressure. - Aortic valve: There was mild regurgitation. - Mitral valve: There was trivial regurgitation. Valve area by   continuity equation (using LVOT flow): 2.49 cm^2. - Left atrium: The atrium was moderately dilated. - Pulmonary arteries: Systolic pressure could not be accurately estimated.   Discharge Exam: Vitals:   03/15/17 0518 03/15/17 1542  BP: 134/66 (!) 114/48  Pulse: 82 95  Resp: 18 18  Temp: 98.3 F (36.8 C) 98.6 F (37 C)   Vitals:   03/14/17 1341 03/14/17 2008 03/15/17 0518 03/15/17 1542  BP: (!)  167/82 (!) 143/77 134/66 (!) 114/48  Pulse: 72 92 82 95  Resp: 18 18 18 18   Temp: 98.7 F (37.1 C) 98.6 F (37 C) 98.3 F (36.8 C) 98.6 F (37 C)  TempSrc: Oral Oral Oral Oral  SpO2: 98% 93% 97%   Weight:      Height:        General: Pt is alert, awake, not in acute distress Cardiovascular: Irreg rhythm, S1/S2 +, no rubs, no gallops Respiratory: CTA bilaterally, no wheezing, no rhonchi Abdominal: Soft, NT, ND, bowel sounds + Extremities: no edema, no cyanosis    The results of significant diagnostics from this hospitalization (including imaging, microbiology, ancillary and laboratory) are listed below for reference.     Microbiology: No results found for this or any previous visit (from the past 240 hour(s)).   Labs: BNP (last 3 results)  Recent Labs  03/14/17 0933  BNP 782.4*   Basic Metabolic Panel:  Recent Labs Lab 03/14/17 0933 03/14/17 1244 03/15/17 0940  NA 142  --  140  K 4.1  --  4.2  CL 109  --  105  CO2 26  --  25  GLUCOSE 135*  --  177*  BUN 37*  --  41*  CREATININE 2.43*  --  2.39*  CALCIUM 7.9*  --  8.0*  MG  --  1.5* 1.6*  PHOS  --  3.5  --    Liver Function Tests:  Recent Labs Lab 03/14/17 0933  AST 17  ALT 15  ALKPHOS 50  BILITOT 0.3  PROT 5.9*  ALBUMIN 2.9*   No results for input(s): LIPASE, AMYLASE in the last 168 hours. No results for input(s): AMMONIA in the last 168 hours. CBC:  Recent Labs Lab 03/14/17 0933 03/15/17 0940  WBC 13.3* 13.6*  NEUTROABS 10.3*  --   HGB 9.3* 8.9*  HCT 30.4* 29.9*  MCV 88.9 88.2  PLT 374 324   Cardiac Enzymes:  Recent Labs Lab 03/14/17 0933 03/14/17 1244 03/14/17 1521 03/14/17 1801  TROPONINI <0.03 <0.03 <0.03 <0.03   BNP: Invalid input(s): POCBNP CBG:  Recent Labs Lab 03/14/17 1345 03/14/17 1631 03/14/17 2120 03/15/17 0652 03/15/17 1158  GLUCAP 119* 172* 185* 116* 158*   D-Dimer No results for input(s): DDIMER in the last 72 hours. Hgb A1c No results for  input(s): HGBA1C in the last 72 hours. Lipid Profile No results for input(s): CHOL, HDL, LDLCALC, TRIG, CHOLHDL, LDLDIRECT in the last 72 hours. Thyroid function studies No results for input(s): TSH, T4TOTAL, T3FREE, THYROIDAB in the last 72 hours.  Invalid input(s): FREET3 Anemia work up No results for input(s): VITAMINB12, FOLATE, FERRITIN, TIBC, IRON, RETICCTPCT in the last 72 hours. Urinalysis    Component Value Date/Time   COLORURINE YELLOW 04/28/2010 0304   APPEARANCEUR CLEAR 04/28/2010 0304   LABSPEC 1.016 04/28/2010 0304   PHURINE 5.5 04/28/2010 0304   GLUCOSEU NEGATIVE 04/28/2010 0304   HGBUR NEGATIVE 04/28/2010 0304   BILIRUBINUR NEGATIVE 04/28/2010 0304   KETONESUR NEGATIVE 04/28/2010 0304   PROTEINUR 100 (A) 04/28/2010 0304   UROBILINOGEN 0.2 04/28/2010 0304   NITRITE NEGATIVE 04/28/2010 0304   LEUKOCYTESUR NEGATIVE 04/28/2010 0304   Sepsis Labs Invalid input(s): PROCALCITONIN,  WBC,  LACTICIDVEN Microbiology No results found for this or any previous visit (from the past 240 hour(s)).   Time coordinating discharge: 40 minutes  SIGNED:  Dessa Phi, DO [facility] Pager [phone]  If 7PM-7AM, please contact night-coverage [email or web address] Password [email or web address] 03/15/2017, 3:59 PM

## 2017-03-15 NOTE — Care Management Note (Signed)
Case Management Note  Patient Details  Name: ENCARNACION BOLE MRN: [id number] Date of Birth: 10-25-1946  Subjective/Objective:   Pt admitted with syncope                   Action/Plan:   PTA from home independent.  Pt has primary care provider and denied barriers to obtaining medications.  CM will continue to follow for discharge needs     Expected Discharge Date:                  Expected Discharge Plan:  Home/Self Care  In-House Referral:     Discharge planning Services  CM Consult  Post Acute Care Choice:    Choice offered to:     DME Arranged:    DME Agency:     HH Arranged:    HH Agency:     Status of Service:  In process, will continue to follow  If discussed at Long Length of Stay Meetings, dates discussed:    Additional Comments:  Maryclare Labrador, RN 03/15/2017, 11:57 AM

## 2017-03-15 NOTE — Progress Notes (Addendum)
PROGRESS NOTE    Melissa Bartlett  ZOX:[id number] DOB: [birth year] DOA: 03/14/2017 PCP: Lynne Logan, MD     Brief Narrative:  Melissa Bartlett is a 71 y.o. female  past mental history of rheumatoid arthritis, atrial fibrillation, some apnea, hypertension, diabetes, chronic kidney disease who presented with syncope. Patient states for a few days prior to coming to the emergency room for evaluation she had nausea vomiting and diarrhea. She felt ill and weak. Her cardiologist changed her Lasix dosing and she took a dose of Lasix the day before admission. Patient states that she was getting out of bed, sat up, felt ill like she was going to throw up, and passed out for 4-5 minutes. Upon awaking, she was not confused, no report of post-ictal change. She then sat in a chair and had some chest pressure which resolved after getting aspirin.   Assessment & Plan:   Principal Problem:   Syncope Active Problems:   Essential hypertension   CKD (chronic kidney disease) stage 4, GFR 15-29 ml/min (HCC)   Chest pain  Syncope -Could be secondary to orthostasis, episode of syncope when getting out of bed, patient had been started on Lasix -Obtain orthostatic vital sign  Chest pain -Episode of chest pressure after syncopal episode -Currently chest pain-free -Troponin x 3 negative  -EKG reviewed independently. A Fib, low voltage, no acute ST change  -CXR reviewed independently. Small right pleural effusion with fluid in fissure, no appreciable infiltrate  -Aspirin daily  -Echocardiogram pending  Chronic atrial fibrillation -Continue Coreg and Xarelto  Rheumatoid arthritis -Methotrexate weekly -Cont prednisone, folic acid  Essential hypertension -Continue Norvasc, Cozaar/HCTZ   DM -SSI  Hyperlipidemia -Continue statin  CKD stage 4 -Cr baseline 2.2 -Stable, monitor BMP   OSA -CPAP qhs   Leukocytosis -Without infectious etiology, could be secondary to chronic prednisone  use -Monitor  Hypomagnesemia -Replace, trend    DVT prophylaxis: Xarelto Code Status: Full Family Communication: no family at bedside  Disposition Plan: pending further work up    Consultants:   None  Procedures:   None  Antimicrobials:   None    Subjective: Patient with complaints of syncopal episode when getting out of bed yesterday morning. That she sat up from from a laying position, felt nauseous, and passed out for 4-5 minutes. She then got up to sit in a chair. She notes that she had some chest pressure at this time, which resolved with aspirin. Currently, patient feels back to baseline. No episodes of further chest pains or shortness of breath.  Objective: Vitals:   03/14/17 1300 03/14/17 1341 03/14/17 2008 03/15/17 0518  BP: (!) 150/69 (!) 167/82 (!) 143/77 134/66  Pulse: 91 72 92 82  Resp: 19 18 18 18   Temp:  98.7 F (37.1 C) 98.6 F (37 C) 98.3 F (36.8 C)  TempSrc:  Oral Oral Oral  SpO2: 94% 98% 93% 97%  Weight:      Height:        Intake/Output Summary (Last 24 hours) at 03/15/17 1105 Last data filed at 03/15/17 0442  Gross per 24 hour  Intake             1020 ml  Output                0 ml  Net             1020 ml   Filed Weights   03/14/17 0916  Weight: 108.9 kg (240 lb)    Examination:  General exam: Appears calm and comfortable  Respiratory system: Clear to auscultation. Respiratory effort normal. Cardiovascular system: S1 & S2 heard, Irreg rhythm. No JVD, murmurs, rubs, gallops or clicks. No pedal edema. Gastrointestinal system: Abdomen is nondistended, soft and nontender. No organomegaly or masses felt. Normal bowel sounds heard. Central nervous system: Alert and oriented. No focal neurological deficits. Extremities: Symmetric 5 x 5 power. Skin: No rashes, lesions or ulcers Psychiatry: Judgement and insight appear normal. Mood & affect appropriate.   Data Reviewed: I have personally reviewed following labs and imaging  studies  CBC:  Recent Labs Lab 03/14/17 0933 03/15/17 0940  WBC 13.3* 13.6*  NEUTROABS 10.3*  --   HGB 9.3* 8.9*  HCT 30.4* 29.9*  MCV 88.9 88.2  PLT 374 160   Basic Metabolic Panel:  Recent Labs Lab 03/14/17 0933 03/14/17 1244  NA 142  --   K 4.1  --   CL 109  --   CO2 26  --   GLUCOSE 135*  --   BUN 37*  --   CREATININE 2.43*  --   CALCIUM 7.9*  --   MG  --  1.5*  PHOS  --  3.5   GFR: Estimated Creatinine Clearance: 25 mL/min (A) (by C-G formula based on SCr of 2.43 mg/dL (H)). Liver Function Tests:  Recent Labs Lab 03/14/17 0933  AST 17  ALT 15  ALKPHOS 50  BILITOT 0.3  PROT 5.9*  ALBUMIN 2.9*   No results for input(s): LIPASE, AMYLASE in the last 168 hours. No results for input(s): AMMONIA in the last 168 hours. Coagulation Profile: No results for input(s): INR, PROTIME in the last 168 hours. Cardiac Enzymes:  Recent Labs Lab 03/14/17 0933 03/14/17 1244 03/14/17 1521 03/14/17 1801  TROPONINI <0.03 <0.03 <0.03 <0.03   BNP (last 3 results) No results for input(s): PROBNP in the last 8760 hours. HbA1C: No results for input(s): HGBA1C in the last 72 hours. CBG:  Recent Labs Lab 03/14/17 1345 03/14/17 1631 03/14/17 2120 03/15/17 0652  GLUCAP 119* 172* 185* 116*   Lipid Profile: No results for input(s): CHOL, HDL, LDLCALC, TRIG, CHOLHDL, LDLDIRECT in the last 72 hours. Thyroid Function Tests: No results for input(s): TSH, T4TOTAL, FREET4, T3FREE, THYROIDAB in the last 72 hours. Anemia Panel: No results for input(s): VITAMINB12, FOLATE, FERRITIN, TIBC, IRON, RETICCTPCT in the last 72 hours. Sepsis Labs: No results for input(s): PROCALCITON, LATICACIDVEN in the last 168 hours.  No results found for this or any previous visit (from the past 240 hour(s)).     Radiology Studies: Dg Chest 2 View  Result Date: 03/14/2017 CLINICAL DATA:  Shortness of breath. EXAM: CHEST  2 VIEW COMPARISON:  January 03, 2014 FINDINGS: Cardiomegaly. Mild  pulmonary edema. Small right effusion with fluid in the fissures. No other interval changes or acute abnormalities. IMPRESSION: Cardiomegaly with mild edema and a small right effusion. Electronically Signed   By: Dorise Bullion III M.D   On: 03/14/2017 10:05      Scheduled Meds: . amLODipine  10 mg Oral Daily  . aspirin EC  325 mg Oral Daily  . brimonidine  1 drop Both Eyes Q12H   And  . timolol  1 drop Both Eyes Q12H  . carvedilol  25 mg Oral BID WC  . folic acid  737 mcg Oral Daily  . losartan  100 mg Oral Daily   And  . hydrochlorothiazide  25 mg Oral Daily  . insulin aspart  0-9 Units Subcutaneous TID  WC  . latanoprost  1 drop Both Eyes QHS  . potassium chloride SA  20 mEq Oral Daily  . predniSONE  5 mg Oral Daily  . Rivaroxaban  15 mg Oral Q supper  . simvastatin  20 mg Oral QPM  . vitamin B-12  1,000 mcg Oral Daily   Continuous Infusions:   LOS: 0 days    Time spent: 40 minutes   Dessa Phi, DO [facility] [email or web address] Password [email or web address] 03/15/2017, 11:05 AM

## 2017-03-16 ENCOUNTER — Other Ambulatory Visit: Payer: Self-pay | Admitting: Cardiology

## 2017-03-16 ENCOUNTER — Encounter: Payer: Self-pay | Admitting: *Deleted

## 2017-03-16 MED ORDER — FUROSEMIDE 40 MG PO TABS: 40.0000 mg | ORAL_TABLET | Freq: Every day | ORAL | 8 refills | Status: DC | PRN

## 2017-03-17 DIAGNOSIS — Z1231 Encounter for screening mammogram for malignant neoplasm of breast: Secondary | ICD-10-CM | POA: Diagnosis not present

## 2017-03-20 DIAGNOSIS — R600 Localized edema: Secondary | ICD-10-CM | POA: Diagnosis not present

## 2017-03-20 DIAGNOSIS — I480 Paroxysmal atrial fibrillation: Secondary | ICD-10-CM | POA: Diagnosis not present

## 2017-03-20 DIAGNOSIS — R55 Syncope and collapse: Secondary | ICD-10-CM | POA: Diagnosis not present

## 2017-03-20 DIAGNOSIS — I110 Hypertensive heart disease with heart failure: Secondary | ICD-10-CM | POA: Diagnosis not present

## 2017-03-20 DIAGNOSIS — I5030 Unspecified diastolic (congestive) heart failure: Secondary | ICD-10-CM | POA: Diagnosis not present

## 2017-03-22 ENCOUNTER — Encounter: Payer: Self-pay | Admitting: Cardiology

## 2017-03-24 ENCOUNTER — Ambulatory Visit: Payer: Medicare Other | Admitting: Cardiology

## 2017-03-24 DIAGNOSIS — N183 Chronic kidney disease, stage 3 (moderate): Secondary | ICD-10-CM | POA: Diagnosis not present

## 2017-03-24 DIAGNOSIS — Z79899 Other long term (current) drug therapy: Secondary | ICD-10-CM | POA: Diagnosis not present

## 2017-03-24 DIAGNOSIS — M0570 Rheumatoid arthritis with rheumatoid factor of unspecified site without organ or systems involvement: Secondary | ICD-10-CM | POA: Diagnosis not present

## 2017-03-24 DIAGNOSIS — M25442 Effusion, left hand: Secondary | ICD-10-CM | POA: Diagnosis not present

## 2017-03-24 DIAGNOSIS — M25441 Effusion, right hand: Secondary | ICD-10-CM | POA: Diagnosis not present

## 2017-03-25 ENCOUNTER — Ambulatory Visit (INDEPENDENT_AMBULATORY_CARE_PROVIDER_SITE_OTHER): Payer: Medicare Other | Admitting: Cardiology

## 2017-03-25 ENCOUNTER — Encounter: Payer: Self-pay | Admitting: Cardiology

## 2017-03-25 VITALS — BP 122/60 | HR 60 | Ht 62.5 in | Wt 252.0 lb

## 2017-03-25 DIAGNOSIS — I480 Paroxysmal atrial fibrillation: Secondary | ICD-10-CM

## 2017-03-25 DIAGNOSIS — I10 Essential (primary) hypertension: Secondary | ICD-10-CM | POA: Diagnosis not present

## 2017-03-25 DIAGNOSIS — G4733 Obstructive sleep apnea (adult) (pediatric): Secondary | ICD-10-CM | POA: Diagnosis not present

## 2017-03-25 NOTE — Progress Notes (Signed)
Cardiology Office Note    Date:  03/25/2017   ID:  Melissa Bartlett, DOB 1946-11-04, MRN [id number]  PCP:  Lynne Logan, MD  Cardiologist:  Candee Furbish, MD  Sleep Medicine: Fransico Him, MD  Chief Complaint  Patient presents with  . Sleep Apnea  . Hypertension    History of Present Illness:  Melissa Bartlett is a 71 y.o. female with a history of CKD, DM, PAF who was referred for OSA. She was referred for a PSG by Richardson Dopp, PA due to snoring, excessive daytime sleepiness with need for naps during the day and afib.  She underwent PSG which showed mild OSA with an AHI of 11.7/hr and severe oxygen desaturations as low as 69%.  She underwent CPAP titration to 13cm H2O.  She is now here for evaluation. She is tolerating her CPAP device well.  She is tolerating her full face mask and feels the pressure Is adequate but says that her machine says that she is having a high leak and her husband can hear the mask leaking at night.  Since going on CPAP she feels more rested in the am with less daytime sleepiness.  She does know if she snores anymore.  She denies any significant mouth or nasal dryness or nasal congestion.     Past Medical History:  Diagnosis Date  . CKD (chronic kidney disease), stage IV ([place])   . Diabetes ([place])   . HTN (hypertension)   . OSA (obstructive sleep apnea)    with AHI 11/hr with nocturnal hypoxemia   . PAF (paroxysmal atrial fibrillation) ([place])    s/p DCCV in 8/17  . RA (rheumatoid arthritis) ([facility])     Past Surgical History:  Procedure Laterality Date  . ANKLE FUSION Right   . BREAST BIOPSY    . CARDIOVERSION N/A 08/15/2016   Procedure: CARDIOVERSION;  Surgeon: Jerline Pain, MD;  Location: [facility];  Service: Cardiovascular;  Laterality: N/A;  . GALLBLADDER SURGERY    . HYSTEROTOMY     Patrtial  . HYSTEROTOMY     Complete  . KNEE ARTHROSCOPY      Current Medications: Current Meds  Medication Sig  . amLODipine (NORVASC) 10 MG tablet Take 1  tablet (10 mg total) by mouth daily.  . Calcium 600-400 MG-UNIT CHEW Chew 1 tablet by mouth 2 (two) times daily.  . carvedilol (COREG) 25 MG tablet Take 25 mg by mouth 2 (two) times daily with a meal.  . COMBIGAN 0.2-0.5 % ophthalmic solution 1 drop twice daily both eyes  . diphenhydramine-acetaminophen (TYLENOL PM) 25-500 MG TABS tablet Take 1 tablet by mouth at bedtime.   . fluticasone (FLONASE) 50 MCG/ACT nasal spray Place 2 sprays into the nose daily as needed for allergies.   . folic acid (FOLVITE) 1 MG tablet Take 1 mg by mouth daily.  . furosemide (LASIX) 40 MG tablet Take 1 tablet (40 mg total) by mouth daily as needed for fluid.  Marland Kitchen latanoprost (XALATAN) 0.005 % ophthalmic solution Place 1 drop into both eyes at bedtime.   Marland Kitchen losartan-hydrochlorothiazide (HYZAAR) 100-25 MG tablet Take 1 tablet by mouth daily.  . methotrexate (RHEUMATREX) 2.5 MG tablet Take 7.5 mg by mouth 2 (two) times a week. 7.5mg  only on Wednesdays and Thursdays -- Caution:Chemotherapy. Protect from light.  . potassium chloride SA (K-DUR,KLOR-CON) 20 MEQ tablet Take 1 tablet (20 mEq total) by mouth daily.  . predniSONE (DELTASONE) 10 MG tablet Take 5 mg by mouth daily.   Marland Kitchen  Rivaroxaban (XARELTO) 15 MG TABS tablet Take 1 tablet (15 mg total) by mouth daily with supper.  . simvastatin (ZOCOR) 20 MG tablet Take 20 mg by mouth every evening.  . vitamin B-12 (CYANOCOBALAMIN) 1000 MCG tablet Take 1,000 mcg by mouth daily.    Allergies:   Enalapril; Codeine; Oxycodone; and Vicodin [hydrocodone-acetaminophen]   Social History   Social History  . Marital status: Married    Spouse name: N/A  . Number of children: N/A  . Years of education: N/A   Social History Main Topics  . Smoking status: Former Smoker    Quit date: 04/08/2016  . Smokeless tobacco: Never Used  . Alcohol use No  . Drug use: No  . Sexual activity: Not Asked   Other Topics Concern  . None   Social History Narrative  . None     Family History:   The patient's family history includes Hypertension in her mother; Stomach cancer in her father.   ROS:   Please see the history of present illness.    ROS All other systems reviewed and are negative.  No flowsheet data found.     PHYSICAL EXAM:   VS:  BP 122/60   Pulse 60   Ht 5' 2.5" (1.588 m)   Wt 252 lb (114.3 kg)   LMP  (LMP Unknown)   BMI 45.36 kg/m    GEN: Well nourished, well developed, in no acute distress  HEENT: normal  Neck: no JVD, carotid bruits, or masses Cardiac: irregularly irregular; no murmurs, rubs, or gallops,no edema.  Intact distal pulses bilaterally.  Respiratory:  clear to auscultation bilaterally, normal work of breathing GI: soft, nontender, nondistended, + BS MS: no deformity or atrophy  Skin: warm and dry, no rash Neuro:  Alert and Oriented x 3, Strength and sensation are intact Psych: euthymic mood, full affect  Wt Readings from Last 3 Encounters:  03/25/17 252 lb (114.3 kg)  03/14/17 240 lb (108.9 kg)  01/11/17 236 lb (107 kg)      Studies/Labs Reviewed:   EKG:  EKG is not ordered today.   Recent Labs: 03/14/2017: ALT 15; B Natriuretic Peptide 435.7 03/15/2017: BUN 41; Creatinine, Ser 2.39; Hemoglobin 8.9; Magnesium 1.6; Platelets 324; Potassium 4.2; Sodium 140   Lipid Panel    Component Value Date/Time   CHOL 150 07/06/2011 0216   TRIG 123 07/06/2011 0216   HDL 46 07/06/2011 0216   CHOLHDL 3.3 07/06/2011 0216   VLDL 25 07/06/2011 0216   LDLCALC 79 07/06/2011 0216    Additional studies/ records that were reviewed today include:  CPAP download    ASSESSMENT:    1. OSA (obstructive sleep apnea)   2. Essential hypertension   3. Morbid obesity (HCC)   4. Paroxysmal atrial fibrillation (HCC)      PLAN:  In order of problems listed above:  OSA - the patient is tolerating PAP therapy well without any problems. The PAP download was reviewed today and showed an AHI of 1.4/hr on 13 cm H2O with 83% compliance in using more than  4 hours nightly.  The patient has been using and benefiting from CPAP use and will continue to benefit from therapy.  HTN - BP controlled on current meds. She will continue on Coreg, ARB and diuretic.  3.   Obesity - I have encouraged him to get into a routine exercise program and cut back on carbs and portions.  4.   Persistent atrial fibrillation - her HR is controlled on  afib.    Medication Adjustments/Labs and Tests Ordered: Current medicines are reviewed at length with the patient today.  Concerns regarding medicines are outlined above.  Medication changes, Labs and Tests ordered today are listed in the Patient Instructions below.  There are no Patient Instructions on file for this visit.   Signed, Fransico Him, MD  03/25/2017 10:43 AM    [address], [place], [place]  [address] Phone: [phone]; Fax: [phone]

## 2017-03-25 NOTE — Patient Instructions (Signed)

## 2017-04-06 DIAGNOSIS — I4891 Unspecified atrial fibrillation: Secondary | ICD-10-CM | POA: Diagnosis not present

## 2017-04-06 DIAGNOSIS — H409 Unspecified glaucoma: Secondary | ICD-10-CM | POA: Diagnosis not present

## 2017-04-06 DIAGNOSIS — Z0000 Encounter for general adult medical examination without abnormal findings: Secondary | ICD-10-CM | POA: Diagnosis not present

## 2017-04-06 DIAGNOSIS — E1122 Type 2 diabetes mellitus with diabetic chronic kidney disease: Secondary | ICD-10-CM | POA: Diagnosis not present

## 2017-04-06 DIAGNOSIS — E785 Hyperlipidemia, unspecified: Secondary | ICD-10-CM | POA: Diagnosis not present

## 2017-04-06 DIAGNOSIS — Z1389 Encounter for screening for other disorder: Secondary | ICD-10-CM | POA: Diagnosis not present

## 2017-04-06 DIAGNOSIS — I110 Hypertensive heart disease with heart failure: Secondary | ICD-10-CM | POA: Diagnosis not present

## 2017-04-06 DIAGNOSIS — M069 Rheumatoid arthritis, unspecified: Secondary | ICD-10-CM | POA: Diagnosis not present

## 2017-04-06 DIAGNOSIS — I5030 Unspecified diastolic (congestive) heart failure: Secondary | ICD-10-CM | POA: Diagnosis not present

## 2017-04-06 DIAGNOSIS — N183 Chronic kidney disease, stage 3 (moderate): Secondary | ICD-10-CM | POA: Diagnosis not present

## 2017-04-14 DIAGNOSIS — M0570 Rheumatoid arthritis with rheumatoid factor of unspecified site without organ or systems involvement: Secondary | ICD-10-CM | POA: Diagnosis not present

## 2017-04-14 DIAGNOSIS — Z79899 Other long term (current) drug therapy: Secondary | ICD-10-CM | POA: Diagnosis not present

## 2017-04-21 DIAGNOSIS — D631 Anemia in chronic kidney disease: Secondary | ICD-10-CM | POA: Diagnosis not present

## 2017-04-21 DIAGNOSIS — M069 Rheumatoid arthritis, unspecified: Secondary | ICD-10-CM | POA: Diagnosis not present

## 2017-04-21 DIAGNOSIS — N390 Urinary tract infection, site not specified: Secondary | ICD-10-CM | POA: Diagnosis not present

## 2017-04-21 DIAGNOSIS — N184 Chronic kidney disease, stage 4 (severe): Secondary | ICD-10-CM | POA: Diagnosis not present

## 2017-05-05 DIAGNOSIS — E669 Obesity, unspecified: Secondary | ICD-10-CM | POA: Diagnosis not present

## 2017-05-05 DIAGNOSIS — R801 Persistent proteinuria, unspecified: Secondary | ICD-10-CM | POA: Diagnosis not present

## 2017-05-05 DIAGNOSIS — D631 Anemia in chronic kidney disease: Secondary | ICD-10-CM | POA: Diagnosis not present

## 2017-05-05 DIAGNOSIS — N184 Chronic kidney disease, stage 4 (severe): Secondary | ICD-10-CM | POA: Diagnosis not present

## 2017-05-05 DIAGNOSIS — I480 Paroxysmal atrial fibrillation: Secondary | ICD-10-CM | POA: Diagnosis not present

## 2017-05-05 DIAGNOSIS — E1129 Type 2 diabetes mellitus with other diabetic kidney complication: Secondary | ICD-10-CM | POA: Diagnosis not present

## 2017-05-05 DIAGNOSIS — M069 Rheumatoid arthritis, unspecified: Secondary | ICD-10-CM | POA: Diagnosis not present

## 2017-05-05 DIAGNOSIS — I129 Hypertensive chronic kidney disease with stage 1 through stage 4 chronic kidney disease, or unspecified chronic kidney disease: Secondary | ICD-10-CM | POA: Diagnosis not present

## 2017-05-12 ENCOUNTER — Other Ambulatory Visit (HOSPITAL_COMMUNITY): Payer: Self-pay | Admitting: *Deleted

## 2017-05-12 ENCOUNTER — Ambulatory Visit (INDEPENDENT_AMBULATORY_CARE_PROVIDER_SITE_OTHER): Payer: Medicare Other | Admitting: Cardiology

## 2017-05-12 VITALS — BP 134/70 | HR 70 | Ht 62.5 in | Wt 254.4 lb

## 2017-05-12 DIAGNOSIS — I4821 Permanent atrial fibrillation: Secondary | ICD-10-CM

## 2017-05-12 DIAGNOSIS — I482 Chronic atrial fibrillation: Secondary | ICD-10-CM | POA: Diagnosis not present

## 2017-05-12 DIAGNOSIS — N183 Chronic kidney disease, stage 3 unspecified: Secondary | ICD-10-CM

## 2017-05-12 NOTE — Progress Notes (Signed)
[facility] [address], [place]  [address] Phone: [phone] Fax:  [phone]  Date:  05/12/2017   ID:  EMONNIE CANNADY, DOB June 15, 1946, MRN [id number]  PCP:  Donald Prose, MD   History of Present Illness: Melissa Bartlett is a 71 y.o. female with permanent atrial fibrillation, rate control, obstructive sleep apnea, morbid obesity here for follow-up. She was in the hospital in March 2018 with atypical chest pain. Troponins were normal. Reassuring. Ejection fraction was also normal.   She has had in the past difficult to control hypertension on multidrug regimen. She is also seeing nephrology because of creatinine in the 2 range.  Her daughter unfortunately has pseudotumor cerebri and underwent shunt procedure and had difficulty with motor weakness, difficulty speaking.   Cardioversion --felt no change. No bleeding, no chest pain, no syncope.  In the past has been on prednisone taper from Dr. Charlestine Night for rheumatoid arthritis.  She denies any chest pain, syncope, orthopnea, PND. Still struggling with weight gain.    Wt Readings from Last 3 Encounters:  05/12/17 254 lb 6 oz (115.4 kg)  03/25/17 252 lb (114.3 kg)  03/14/17 240 lb (108.9 kg)     Past Medical History:  Diagnosis Date  . CKD (chronic kidney disease), stage IV ([facility])   . Diabetes ([facility])   . HTN (hypertension)   . OSA (obstructive sleep apnea)    with AHI 11/hr with nocturnal hypoxemia   . PAF (paroxysmal atrial fibrillation) ([facility])    s/p DCCV in 8/17  . RA (rheumatoid arthritis) ([facility])     Past Surgical History:  Procedure Laterality Date  . ANKLE FUSION Right   . BREAST BIOPSY    . CARDIOVERSION N/A 08/15/2016   Procedure: CARDIOVERSION;  Surgeon: Jerline Pain, MD;  Location: [facility];  Service: Cardiovascular;  Laterality: N/A;  . GALLBLADDER SURGERY    . HYSTEROTOMY     Patrtial  . HYSTEROTOMY     Complete  . KNEE ARTHROSCOPY      Current Outpatient Prescriptions    Medication Sig Dispense Refill  . amLODipine (NORVASC) 10 MG tablet Take 1 tablet (10 mg total) by mouth daily. 90 tablet 3  . Calcium 600-400 MG-UNIT CHEW Chew 1 tablet by mouth 2 (two) times daily.    . carvedilol (COREG) 25 MG tablet Take 25 mg by mouth 2 (two) times daily with a meal.    . COMBIGAN 0.2-0.5 % ophthalmic solution 1 drop twice daily both eyes    . diphenhydramine-acetaminophen (TYLENOL PM) 25-500 MG TABS tablet Take 1 tablet by mouth at bedtime.     . fluticasone (FLONASE) 50 MCG/ACT nasal spray Place 2 sprays into the nose daily as needed for allergies.     . folic acid (FOLVITE) 1 MG tablet Take 1 mg by mouth daily.  2  . furosemide (LASIX) 40 MG tablet Take 1 tablet (40 mg total) by mouth daily as needed for fluid. 30 tablet 8  . latanoprost (XALATAN) 0.005 % ophthalmic solution Place 1 drop into both eyes at bedtime.   11  . losartan-hydrochlorothiazide (HYZAAR) 100-25 MG tablet Take 1 tablet by mouth daily. 90 tablet 3  . methotrexate (RHEUMATREX) 2.5 MG tablet Take 7.5 mg by mouth 2 (two) times a week. 7.5mg  only on Wednesdays and Thursdays -- Caution:Chemotherapy. Protect from light.    . potassium chloride SA (K-DUR,KLOR-CON) 20 MEQ tablet Take 1 tablet (20 mEq total) by mouth daily. 90 tablet  3  . predniSONE (DELTASONE) 10 MG tablet Take 5 mg by mouth daily.   0  . Rivaroxaban (XARELTO) 15 MG TABS tablet Take 1 tablet (15 mg total) by mouth daily with supper. 30 tablet 11  . simvastatin (ZOCOR) 20 MG tablet Take 20 mg by mouth every evening.  2  . vitamin B-12 (CYANOCOBALAMIN) 1000 MCG tablet Take 1,000 mcg by mouth daily.     No current facility-administered medications for this visit.     Allergies:    Allergies  Allergen Reactions  . Enalapril Other (See Comments)    Systemic reaction - jerking  . Codeine Itching  . Oxycodone Itching  . Vicodin [Hydrocodone-Acetaminophen] Itching    Social History:  The patient  reports that she quit smoking about 13  months ago. She has never used smokeless tobacco. She reports that she does not drink alcohol or use drugs.   ROS:  Please see the history of present illness.   Unless stated above all others negative  PHYSICAL EXAM: VS:  BP 134/70   Pulse 70   Ht 5' 2.5" (1.588 m)   Wt 254 lb 6 oz (115.4 kg)   LMP  (LMP Unknown)   SpO2 97%   BMI 45.78 kg/m  GEN: Well nourished, well developed, in no acute distress  HEENT: normal  Neck: no JVD, carotid bruits, or masses Cardiac: Irregularly irregular; no murmurs, rubs, or gallops,no edema  Respiratory:  clear to auscultation bilaterally, normal work of breathing GI: soft, nontender, nondistended, + BS, obese MS: no deformity or atrophy  Skin: warm and dry, no rash Neuro:  Alert and Oriented x 3, Strength and sensation are intact Psych: euthymic mood, full affect  EKG:  07/08/16 - AFIB 75, NSSTW changes. 07/09/15 - SB with PRWP no changes. 03/07/14 - NSR no other changes.   ASSESSMENT AND PLAN:  1. AFIB - now permanent, last DC cardioversion was performed several months ago. Really no change in symptoms. Discovered on 07/08/16 CHADS-VASc -3 female and HTN, Age (A1c 5.6 on no meds). cardioversion at that time. She is completely asymptomatic at this time. She did not realize that she was in atrial fibrillation. Risk factor includes obesity. She is well rate controlled currently.  2. Chronic anticoagulation-continue Xarelto in the evening. Serum creatinine is 2.1 on 04/08/16. GFR is 27. (15-50) We discussed bleeding risks. 3. Stage IV chronic kidney disease-as above. Avoid NSAIDs. Reviewed nephrology notes. Still continuing to follow nephrology 4. Hypertension- overall doing very well and is quite pleased. Medications reviewed 5. Morbid Obesity- no changes, continue to encourage weight loss. This is contributing to her lower external edema. Her podiatrist is considering sending her to a pain specialist. She likely has a degree of venous insufficiency as well  as. Continue with compression. Weight loss. 6. Tobacco use-excellent job with tobacco cessation 7. A1c 5.6. Excellent. Doing very well. 8. Obstructive sleep apnea-sleep study November 2017-Dr. Turner, CPAP, working with [facility] to improve mask leak 9. 6 months with me.  Signed, Candee Furbish, MD [facility]  05/12/2017 4:31 PM

## 2017-05-12 NOTE — Patient Instructions (Signed)

## 2017-05-13 ENCOUNTER — Encounter (HOSPITAL_COMMUNITY)
Admission: RE | Admit: 2017-05-13 | Discharge: 2017-05-13 | Disposition: A | Discharge status: Home / Self Care | Payer: Medicare Other | Source: Ambulatory Visit | Attending: Nephrology | Admitting: Nephrology

## 2017-05-13 DIAGNOSIS — N184 Chronic kidney disease, stage 4 (severe): Secondary | ICD-10-CM

## 2017-05-13 DIAGNOSIS — D631 Anemia in chronic kidney disease: Secondary | ICD-10-CM | POA: Insufficient documentation

## 2017-05-13 LAB — POCT HEMOGLOBIN-HEMACUE: Hemoglobin: 9.4 g/dL — ABNORMAL LOW (ref 12.0–15.0)

## 2017-05-13 MED ORDER — DARBEPOETIN ALFA 60 MCG/0.3ML IJ SOSY
PREFILLED_SYRINGE | INTRAMUSCULAR | Status: AC
  Administered 2017-05-13: 60 ug via SUBCUTANEOUS
  Filled 2017-05-13: qty 0.3

## 2017-05-13 MED ORDER — DARBEPOETIN ALFA 60 MCG/0.3ML IJ SOSY
60.0000 ug | PREFILLED_SYRINGE | INTRAMUSCULAR | Status: DC
  Administered 2017-05-13: 60 ug via SUBCUTANEOUS

## 2017-05-13 MED ORDER — SODIUM CHLORIDE 0.9 % IV SOLN
510.0000 mg | INTRAVENOUS | Status: DC
  Administered 2017-05-13: 09:00:00 510 mg via INTRAVENOUS
  Filled 2017-05-13: qty 17

## 2017-05-13 NOTE — Discharge Instructions (Signed)
Darbepoetin Alfa injection °What is this medicine? °DARBEPOETIN ALFA (dar be POE e tin AL fa) helps your body make more red blood cells. It is used to treat anemia caused by chronic kidney failure and chemotherapy. °This medicine may be used for other purposes; ask your health care provider or pharmacist if you have questions. °COMMON BRAND NAME(S): Aranesp °What should I tell my health care provider before I take this medicine? °They need to know if you have any of these conditions: °-blood clotting disorders or history of blood clots °-cancer patient not on chemotherapy °-cystic fibrosis °-heart disease, such as angina, heart failure, or a history of a heart attack °-hemoglobin level of 12 g/dL or greater °-high blood pressure °-low levels of folate, iron, or vitamin B12 °-seizures °-an unusual or allergic reaction to darbepoetin, erythropoietin, albumin, hamster proteins, latex, other medicines, foods, dyes, or preservatives °-pregnant or trying to get pregnant °-breast-feeding °How should I use this medicine? °This medicine is for injection into a vein or under the skin. It is usually given by a health care professional in a hospital or clinic setting. °If you get this medicine at home, you will be taught how to prepare and give this medicine. Use exactly as directed. Take your medicine at regular intervals. Do not take your medicine more often than directed. °It is important that you put your used needles and syringes in a special sharps container. Do not put them in a trash can. If you do not have a sharps container, call your pharmacist or healthcare provider to get one. °A special MedGuide will be given to you by the pharmacist with each prescription and refill. Be sure to read this information carefully each time. °Talk to your pediatrician regarding the use of this medicine in children. While this medicine may be used in children as young as 1 year for selected conditions, precautions do  apply. °Overdosage: If you think you have taken too much of this medicine contact a poison control center or emergency room at once. °NOTE: This medicine is only for you. Do not share this medicine with others. °What if I miss a dose? °If you miss a dose, take it as soon as you can. If it is almost time for your next dose, take only that dose. Do not take double or extra doses. °What may interact with this medicine? °Do not take this medicine with any of the following medications: °-epoetin alfa °This list may not describe all possible interactions. Give your health care provider a list of all the medicines, herbs, non-prescription drugs, or dietary supplements you use. Also tell them if you smoke, drink alcohol, or use illegal drugs. Some items may interact with your medicine. °What should I watch for while using this medicine? °Your condition will be monitored carefully while you are receiving this medicine. °You may need blood work done while you are taking this medicine. °What side effects may I notice from receiving this medicine? °Side effects that you should report to your doctor or health care professional as soon as possible: °-allergic reactions like skin rash, itching or hives, swelling of the face, lips, or tongue °-breathing problems °-changes in vision °-chest pain °-confusion, trouble speaking or understanding °-feeling faint or lightheaded, falls °-high blood pressure °-muscle aches or pains °-pain, swelling, warmth in the leg °-rapid weight gain °-severe headaches °-sudden numbness or weakness of the face, arm or leg °-trouble walking, dizziness, loss of balance or coordination °-seizures (convulsions) °-swelling of the ankles, feet, hands °-  unusually weak or tired °Side effects that usually do not require medical attention (report to your doctor or health care professional if they continue or are bothersome): °-diarrhea °-fever, chills (flu-like symptoms) °-headaches °-nausea, vomiting °-redness,  stinging, or swelling at site where injected °This list may not describe all possible side effects. Call your doctor for medical advice about side effects. You may report side effects to FDA at 1-800-FDA-1088. °Where should I keep my medicine? °Keep out of the reach of children. °Store in a refrigerator between 2 and 8 degrees C (36 and 46 degrees F). Do not freeze. Do not shake. Throw away any unused portion if using a single-dose vial. Throw away any unused medicine after the expiration date. °NOTE: This sheet is a summary. It may not cover all possible information. If you have questions about this medicine, talk to your doctor, pharmacist, or health care provider. °© 2018 [employer]vier/[employer] Standard (2016-08-04 19:52:26) °Ferumoxytol injection °What is this medicine? °FERUMOXYTOL is an iron complex. Iron is used to make healthy red blood cells, which carry oxygen and nutrients throughout the body. This medicine is used to treat iron deficiency anemia in people with chronic kidney disease. °This medicine may be used for other purposes; ask your health care provider or pharmacist if you have questions. °COMMON BRAND NAME(S): Feraheme °What should I tell my health care provider before I take this medicine? °They need to know if you have any of these conditions: °-anemia not caused by low iron levels °-high levels of iron in the blood °-magnetic resonance imaging (MRI) test scheduled °-an unusual or allergic reaction to iron, other medicines, foods, dyes, or preservatives °-pregnant or trying to get pregnant °-breast-feeding °How should I use this medicine? °This medicine is for injection into a vein. It is given by a health care professional in a hospital or clinic setting. °Talk to your pediatrician regarding the use of this medicine in children. Special care may be needed. °Overdosage: If you think you have taken too much of this medicine contact a poison control center or emergency room at once. °NOTE: This medicine  is only for you. Do not share this medicine with others. °What if I miss a dose? °It is important not to miss your dose. Call your doctor or health care professional if you are unable to keep an appointment. °What may interact with this medicine? °This medicine may interact with the following medications: °-other iron products °This list may not describe all possible interactions. Give your health care provider a list of all the medicines, herbs, non-prescription drugs, or dietary supplements you use. Also tell them if you smoke, drink alcohol, or use illegal drugs. Some items may interact with your medicine. °What should I watch for while using this medicine? °Visit your doctor or healthcare professional regularly. Tell your doctor or healthcare professional if your symptoms do not start to get better or if they get worse. You may need blood work done while you are taking this medicine. °You may need to follow a special diet. Talk to your doctor. Foods that contain iron include: whole grains/cereals, dried fruits, beans, or peas, leafy green vegetables, and organ meats (liver, kidney). °What side effects may I notice from receiving this medicine? °Side effects that you should report to your doctor or health care professional as soon as possible: °-allergic reactions like skin rash, itching or hives, swelling of the face, lips, or tongue °-breathing problems °-changes in blood pressure °-feeling faint or lightheaded, falls °-fever or chills °-flushing,   sweating, or hot feelings °-swelling of the ankles or feet °Side effects that usually do not require medical attention (report to your doctor or health care professional if they continue or are bothersome): °-diarrhea °-headache °-nausea, vomiting °-stomach pain °This list may not describe all possible side effects. Call your doctor for medical advice about side effects. You may report side effects to FDA at 1-800-FDA-1088. °Where should I keep my medicine? °This drug  is given in a hospital or clinic and will not be stored at home. °NOTE: This sheet is a summary. It may not cover all possible information. If you have questions about this medicine, talk to your doctor, pharmacist, or health care provider. °© 2018 Elsevier/Gold Standard (2016-01-17 12:41:49) ° °

## 2017-05-19 ENCOUNTER — Other Ambulatory Visit (HOSPITAL_COMMUNITY): Payer: Self-pay | Admitting: *Deleted

## 2017-05-20 ENCOUNTER — Ambulatory Visit (HOSPITAL_COMMUNITY)
Admission: RE | Admit: 2017-05-20 | Discharge: 2017-05-20 | Disposition: A | Discharge status: Home / Self Care | Payer: Medicare Other | Source: Ambulatory Visit | Attending: Nephrology | Admitting: Nephrology

## 2017-05-20 DIAGNOSIS — D631 Anemia in chronic kidney disease: Secondary | ICD-10-CM | POA: Diagnosis not present

## 2017-05-20 MED ORDER — SODIUM CHLORIDE 0.9 % IV SOLN
510.0000 mg | INTRAVENOUS | Status: DC
  Administered 2017-05-20: 510 mg via INTRAVENOUS
  Filled 2017-05-20: qty 17

## 2017-06-10 ENCOUNTER — Encounter (HOSPITAL_COMMUNITY): Payer: Medicare Other

## 2017-06-17 ENCOUNTER — Encounter (HOSPITAL_COMMUNITY): Payer: Medicare Other

## 2017-06-17 ENCOUNTER — Encounter (HOSPITAL_COMMUNITY)
Admission: RE | Admit: 2017-06-17 | Discharge: 2017-06-17 | Disposition: A | Discharge status: Home / Self Care | Payer: Medicare Other | Source: Ambulatory Visit | Attending: Nephrology | Admitting: Nephrology

## 2017-06-17 DIAGNOSIS — Z6841 Body Mass Index (BMI) 40.0 and over, adult: Secondary | ICD-10-CM | POA: Diagnosis not present

## 2017-06-17 DIAGNOSIS — R768 Other specified abnormal immunological findings in serum: Secondary | ICD-10-CM | POA: Diagnosis not present

## 2017-06-17 DIAGNOSIS — M150 Primary generalized (osteo)arthritis: Secondary | ICD-10-CM | POA: Diagnosis not present

## 2017-06-17 DIAGNOSIS — R5383 Other fatigue: Secondary | ICD-10-CM | POA: Diagnosis not present

## 2017-06-17 DIAGNOSIS — D631 Anemia in chronic kidney disease: Secondary | ICD-10-CM | POA: Diagnosis not present

## 2017-06-17 DIAGNOSIS — M0579 Rheumatoid arthritis with rheumatoid factor of multiple sites without organ or systems involvement: Secondary | ICD-10-CM | POA: Diagnosis not present

## 2017-06-17 DIAGNOSIS — M2550 Pain in unspecified joint: Secondary | ICD-10-CM | POA: Diagnosis not present

## 2017-06-17 DIAGNOSIS — N184 Chronic kidney disease, stage 4 (severe): Secondary | ICD-10-CM

## 2017-06-17 LAB — CBC
HCT: 39.1 % (ref 36.0–46.0)
HEMOGLOBIN: 12.1 g/dL (ref 12.0–15.0)
MCH: 26.9 pg (ref 26.0–34.0)
MCHC: 30.9 g/dL (ref 30.0–36.0)
MCV: 86.9 fL (ref 78.0–100.0)
PLATELETS: 217 10*3/uL (ref 150–400)
RBC: 4.5 MIL/uL (ref 3.87–5.11)
RDW: 15.4 % (ref 11.5–15.5)
WBC: 11.4 10*3/uL — ABNORMAL HIGH (ref 4.0–10.5)

## 2017-06-17 MED ORDER — DARBEPOETIN ALFA 60 MCG/0.3ML IJ SOSY: 60.0000 ug | PREFILLED_SYRINGE | INTRAMUSCULAR | Status: DC

## 2017-06-22 DIAGNOSIS — H4053X1 Glaucoma secondary to other eye disorders, bilateral, mild stage: Secondary | ICD-10-CM | POA: Diagnosis not present

## 2017-06-30 ENCOUNTER — Encounter (HOSPITAL_COMMUNITY)
Admission: RE | Admit: 2017-06-30 | Discharge: 2017-06-30 | Disposition: A | Discharge status: Home / Self Care | Payer: Medicare Other | Source: Ambulatory Visit | Attending: Nephrology | Admitting: Nephrology

## 2017-06-30 DIAGNOSIS — D631 Anemia in chronic kidney disease: Secondary | ICD-10-CM | POA: Diagnosis not present

## 2017-06-30 DIAGNOSIS — N184 Chronic kidney disease, stage 4 (severe): Secondary | ICD-10-CM

## 2017-06-30 LAB — POCT HEMOGLOBIN-HEMACUE: HEMOGLOBIN: 11.2 g/dL — AB (ref 12.0–15.0)

## 2017-06-30 MED ORDER — DARBEPOETIN ALFA 60 MCG/0.3ML IJ SOSY
PREFILLED_SYRINGE | INTRAMUSCULAR | Status: AC
  Filled 2017-06-30: qty 0.3

## 2017-06-30 MED ORDER — DARBEPOETIN ALFA 60 MCG/0.3ML IJ SOSY
60.0000 ug | PREFILLED_SYRINGE | INTRAMUSCULAR | Status: DC
  Administered 2017-06-30: 10:00:00 60 ug via SUBCUTANEOUS

## 2017-07-09 DIAGNOSIS — H4053X1 Glaucoma secondary to other eye disorders, bilateral, mild stage: Secondary | ICD-10-CM | POA: Diagnosis not present

## 2017-07-13 ENCOUNTER — Ambulatory Visit: Payer: Medicare Other | Admitting: Cardiology

## 2017-07-22 DIAGNOSIS — M150 Primary generalized (osteo)arthritis: Secondary | ICD-10-CM | POA: Diagnosis not present

## 2017-07-22 DIAGNOSIS — Z6841 Body Mass Index (BMI) 40.0 and over, adult: Secondary | ICD-10-CM | POA: Diagnosis not present

## 2017-07-22 DIAGNOSIS — M0579 Rheumatoid arthritis with rheumatoid factor of multiple sites without organ or systems involvement: Secondary | ICD-10-CM | POA: Diagnosis not present

## 2017-07-22 DIAGNOSIS — E790 Hyperuricemia without signs of inflammatory arthritis and tophaceous disease: Secondary | ICD-10-CM | POA: Diagnosis not present

## 2017-07-22 DIAGNOSIS — M2550 Pain in unspecified joint: Secondary | ICD-10-CM | POA: Diagnosis not present

## 2017-07-27 ENCOUNTER — Other Ambulatory Visit: Payer: Self-pay | Admitting: Cardiology

## 2017-07-27 DIAGNOSIS — I480 Paroxysmal atrial fibrillation: Secondary | ICD-10-CM

## 2017-07-27 NOTE — Telephone Encounter (Signed)
Pt last saw Dr Marlou Porch 05/12/17, last labs 03/15/17 Creat 2.39, age 71, weight 113.4kg, CrCl 39.21, based on CrCl pt is on appropriate dosage of Xarelto 15mg  QD.  Will refill rx.

## 2017-07-28 ENCOUNTER — Encounter (HOSPITAL_COMMUNITY)
Admission: RE | Admit: 2017-07-28 | Discharge: 2017-07-28 | Disposition: A | Discharge status: Home / Self Care | Payer: Medicare Other | Source: Ambulatory Visit | Attending: Nephrology | Admitting: Nephrology

## 2017-07-28 DIAGNOSIS — D631 Anemia in chronic kidney disease: Secondary | ICD-10-CM | POA: Diagnosis not present

## 2017-07-28 DIAGNOSIS — N184 Chronic kidney disease, stage 4 (severe): Secondary | ICD-10-CM

## 2017-07-28 LAB — COMPREHENSIVE METABOLIC PANEL
ALBUMIN: 3.1 g/dL — AB (ref 3.5–5.0)
ALK PHOS: 70 U/L (ref 38–126)
ALT: 14 U/L (ref 14–54)
AST: 14 U/L — AB (ref 15–41)
Anion gap: 9 (ref 5–15)
BUN: 33 mg/dL — AB (ref 6–20)
CALCIUM: 8.2 mg/dL — AB (ref 8.9–10.3)
CHLORIDE: 108 mmol/L (ref 101–111)
CO2: 24 mmol/L (ref 22–32)
CREATININE: 2.33 mg/dL — AB (ref 0.44–1.00)
GFR calc non Af Amer: 20 mL/min — ABNORMAL LOW (ref >60)
GFR, EST AFRICAN AMERICAN: 23 mL/min — AB (ref >60)
GLUCOSE: 91 mg/dL (ref 65–99)
Potassium: 4.1 mmol/L (ref 3.5–5.1)
SODIUM: 141 mmol/L (ref 135–145)
Total Bilirubin: 0.3 mg/dL (ref 0.3–1.2)
Total Protein: 6.5 g/dL (ref 6.5–8.1)

## 2017-07-28 LAB — PHOSPHORUS: PHOSPHORUS: 3.3 mg/dL (ref 2.5–4.6)

## 2017-07-28 LAB — CBC
HCT: 38.4 % (ref 36.0–46.0)
HEMOGLOBIN: 11.6 g/dL — AB (ref 12.0–15.0)
MCH: 26.1 pg (ref 26.0–34.0)
MCHC: 30.2 g/dL (ref 30.0–36.0)
MCV: 86.3 fL (ref 78.0–100.0)
PLATELETS: 217 10*3/uL (ref 150–400)
RBC: 4.45 MIL/uL (ref 3.87–5.11)
RDW: 16 % — ABNORMAL HIGH (ref 11.5–15.5)
WBC: 12.3 10*3/uL — AB (ref 4.0–10.5)

## 2017-07-28 LAB — IRON AND TIBC
Iron: 81 ug/dL (ref 28–170)
SATURATION RATIOS: 31 % (ref 10.4–31.8)
TIBC: 259 ug/dL (ref 250–450)
UIBC: 178 ug/dL

## 2017-07-28 LAB — FERRITIN: Ferritin: 228 ng/mL (ref 11–307)

## 2017-07-28 MED ORDER — DARBEPOETIN ALFA 60 MCG/0.3ML IJ SOSY
PREFILLED_SYRINGE | INTRAMUSCULAR | Status: AC
  Filled 2017-07-28: qty 0.3

## 2017-07-28 MED ORDER — DARBEPOETIN ALFA 60 MCG/0.3ML IJ SOSY
60.0000 ug | PREFILLED_SYRINGE | INTRAMUSCULAR | Status: DC
  Administered 2017-07-28: 11:00:00 60 ug via SUBCUTANEOUS

## 2017-08-19 DIAGNOSIS — R801 Persistent proteinuria, unspecified: Secondary | ICD-10-CM | POA: Diagnosis not present

## 2017-08-19 DIAGNOSIS — E1129 Type 2 diabetes mellitus with other diabetic kidney complication: Secondary | ICD-10-CM | POA: Diagnosis not present

## 2017-08-19 DIAGNOSIS — E669 Obesity, unspecified: Secondary | ICD-10-CM | POA: Diagnosis not present

## 2017-08-19 DIAGNOSIS — M069 Rheumatoid arthritis, unspecified: Secondary | ICD-10-CM | POA: Diagnosis not present

## 2017-08-19 DIAGNOSIS — I480 Paroxysmal atrial fibrillation: Secondary | ICD-10-CM | POA: Diagnosis not present

## 2017-08-19 DIAGNOSIS — I129 Hypertensive chronic kidney disease with stage 1 through stage 4 chronic kidney disease, or unspecified chronic kidney disease: Secondary | ICD-10-CM | POA: Diagnosis not present

## 2017-08-19 DIAGNOSIS — D631 Anemia in chronic kidney disease: Secondary | ICD-10-CM | POA: Diagnosis not present

## 2017-08-19 DIAGNOSIS — N184 Chronic kidney disease, stage 4 (severe): Secondary | ICD-10-CM | POA: Diagnosis not present

## 2017-08-24 ENCOUNTER — Other Ambulatory Visit (HOSPITAL_COMMUNITY): Payer: Self-pay | Admitting: *Deleted

## 2017-08-25 ENCOUNTER — Encounter (HOSPITAL_COMMUNITY)
Admission: RE | Admit: 2017-08-25 | Discharge: 2017-08-25 | Disposition: A | Discharge status: Home / Self Care | Payer: Medicare Other | Source: Ambulatory Visit | Attending: Nephrology | Admitting: Nephrology

## 2017-08-25 DIAGNOSIS — D631 Anemia in chronic kidney disease: Secondary | ICD-10-CM | POA: Insufficient documentation

## 2017-08-25 DIAGNOSIS — N184 Chronic kidney disease, stage 4 (severe): Secondary | ICD-10-CM

## 2017-08-25 LAB — CBC
HEMATOCRIT: 39.5 % (ref 36.0–46.0)
Hemoglobin: 11.8 g/dL — ABNORMAL LOW (ref 12.0–15.0)
MCH: 26.4 pg (ref 26.0–34.0)
MCHC: 29.9 g/dL — ABNORMAL LOW (ref 30.0–36.0)
MCV: 88.4 fL (ref 78.0–100.0)
Platelets: 190 10*3/uL (ref 150–400)
RBC: 4.47 MIL/uL (ref 3.87–5.11)
RDW: 15.4 % (ref 11.5–15.5)
WBC: 11.9 10*3/uL — AB (ref 4.0–10.5)

## 2017-08-25 MED ORDER — DARBEPOETIN ALFA 60 MCG/0.3ML IJ SOSY
PREFILLED_SYRINGE | INTRAMUSCULAR | Status: AC
  Administered 2017-08-25: 11:00:00 60 ug via SUBCUTANEOUS
  Filled 2017-08-25: qty 0.3

## 2017-08-25 MED ORDER — DARBEPOETIN ALFA 60 MCG/0.3ML IJ SOSY
60.0000 ug | PREFILLED_SYRINGE | INTRAMUSCULAR | Status: DC
  Administered 2017-08-25: 60 ug via SUBCUTANEOUS

## 2017-08-27 ENCOUNTER — Ambulatory Visit: Payer: Medicare Other

## 2017-08-27 ENCOUNTER — Ambulatory Visit (INDEPENDENT_AMBULATORY_CARE_PROVIDER_SITE_OTHER): Payer: Medicare Other | Admitting: Podiatry

## 2017-08-27 ENCOUNTER — Encounter: Payer: Self-pay | Admitting: Podiatry

## 2017-08-27 ENCOUNTER — Ambulatory Visit (INDEPENDENT_AMBULATORY_CARE_PROVIDER_SITE_OTHER): Payer: Medicare Other

## 2017-08-27 DIAGNOSIS — M25571 Pain in right ankle and joints of right foot: Secondary | ICD-10-CM

## 2017-08-27 DIAGNOSIS — R269 Unspecified abnormalities of gait and mobility: Secondary | ICD-10-CM | POA: Diagnosis not present

## 2017-08-27 DIAGNOSIS — S93491A Sprain of other ligament of right ankle, initial encounter: Secondary | ICD-10-CM

## 2017-08-27 DIAGNOSIS — M79671 Pain in right foot: Secondary | ICD-10-CM

## 2017-08-27 NOTE — Progress Notes (Signed)
Subjective:    Patient ID: Melissa Bartlett, female    DOB: 1946-08-02, 71 y.o.   MRN: [id number]  HPI   Chief Complaint  Patient presents with  . Ankle Pain    Right, meidal and lateral side x 1 day. Pt stated "I jumped up at choir practice and started having pain after the practice ended"    71 y.o. female presents with the above complaint. States that she jumped up at choir practice and believes she injured her R ankle. Denies fall. Reports pain at the outside of her R ankle, with some adiditonal pain on the inside. Reports hx of DM and ESRD. Has been wearing a CAM boot but still unable to bear weight on it and feels unstable wearing her boot.  Past Medical History:  Diagnosis Date  . CKD (chronic kidney disease), stage IV ([place])   . Diabetes ([place])   . HTN (hypertension)   . OSA (obstructive sleep apnea)    with AHI 11/hr with nocturnal hypoxemia   . PAF (paroxysmal atrial fibrillation) ([place])    s/p DCCV in 8/17  . RA (rheumatoid arthritis) ([place])    Past Surgical History:  Procedure Laterality Date  . ANKLE FUSION Right   . BREAST BIOPSY    . CARDIOVERSION N/A 08/15/2016   Procedure: CARDIOVERSION;  Surgeon: Jerline Pain, MD;  Location: [facility];  Service: Cardiovascular;  Laterality: N/A;  . GALLBLADDER SURGERY    . HYSTEROTOMY     Patrtial  . HYSTEROTOMY     Complete  . KNEE ARTHROSCOPY      Current Outpatient Prescriptions:  .  amLODipine (NORVASC) 10 MG tablet, Take 1 tablet (10 mg total) by mouth daily., Disp: 90 tablet, Rfl: 3 .  Calcium 600-400 MG-UNIT CHEW, Chew 1 tablet by mouth 2 (two) times daily., Disp: , Rfl:  .  carvedilol (COREG) 25 MG tablet, Take 25 mg by mouth 2 (two) times daily with a meal., Disp: , Rfl:  .  COMBIGAN 0.2-0.5 % ophthalmic solution, 1 drop twice daily both eyes, Disp: , Rfl:  .  diphenhydramine-acetaminophen (TYLENOL PM) 25-500 MG TABS tablet, Take 1 tablet by mouth at bedtime. , Disp: , Rfl:  .  fluticasone (FLONASE) 50 MCG/ACT  nasal spray, Place 2 sprays into the nose daily as needed for allergies. , Disp: , Rfl:  .  folic acid (FOLVITE) 1 MG tablet, Take 1 mg by mouth daily., Disp: , Rfl: 2 .  furosemide (LASIX) 40 MG tablet, Take 1 tablet (40 mg total) by mouth daily as needed for fluid., Disp: 30 tablet, Rfl: 8 .  latanoprost (XALATAN) 0.005 % ophthalmic solution, Place 1 drop into both eyes at bedtime. , Disp: , Rfl: 11 .  leflunomide (ARAVA) 20 MG tablet, Take 20 mg by mouth daily., Disp: , Rfl:  .  losartan-hydrochlorothiazide (HYZAAR) 100-25 MG tablet, Take 1 tablet by mouth daily., Disp: 90 tablet, Rfl: 3 .  methotrexate (RHEUMATREX) 2.5 MG tablet, Take 7.5 mg by mouth 2 (two) times a week. 7.5mg  only on Wednesdays and Thursdays -- Caution:Chemotherapy. Protect from light., Disp: , Rfl:  .  potassium chloride SA (K-DUR,KLOR-CON) 20 MEQ tablet, Take 1 tablet (20 mEq total) by mouth daily., Disp: 90 tablet, Rfl: 3 .  predniSONE (DELTASONE) 10 MG tablet, Take 5 mg by mouth daily. , Disp: , Rfl: 0 .  predniSONE (DELTASONE) 5 MG tablet, , Disp: , Rfl:  .  RHOPRESSA 0.02 % SOLN, , Disp: , Rfl:  .  simvastatin (ZOCOR) 20 MG tablet, Take 20 mg by mouth every evening., Disp: , Rfl: 2 .  vitamin B-12 (CYANOCOBALAMIN) 1000 MCG tablet, Take 1,000 mcg by mouth daily., Disp: , Rfl:  .  XARELTO 15 MG TABS tablet, TAKE 1 TABLET BY MOUTH EVERY DAY WITH SUPPER, Disp: 30 tablet, Rfl: 8  Allergies  Allergen Reactions  . Enalapril Other (See Comments)    Systemic reaction - jerking  . Codeine Itching  . Oxycodone Itching  . Vicodin [Hydrocodone-Acetaminophen] Itching      Review of Systems     Objective:   Physical Exam There were no vitals filed for this visit. General AA&O x3. Normal mood and affect.  Vascular Dorsalis pedis and posterior tibial pulses  present 2+ bilaterally  Capillary refill normal to all digits. Pedal hair growth normal.  Neurologic Epicritic sensation grossly present.  Dermatologic No open  lesions. Interspaces clear of maceration. Nails well groomed and normal in appearance.  Orthopedic: MMT 5/5 in dorsiflexion, plantarflexion, inversion, and eversion. Tenderness noted to the R ATFL.  No pain at the deltoids.  Anterior draw and talar tilt stable and without pain. No pain at the bony aspects of the fibula and tibia.   Radiographs: Taken and reviewed. No acute fractures or dislocations. No other osseous abnormalities.    Assessment & Plan:  Patient was evaluated and treated and all questions answered.  R ATFL Sprain -XR reviewed with patient. -Stirrup ankle brace dispensed for R ankle. Medically necessary to protect the R ATFL, promote ligament healing, and promote safe weightbearing. -Unable to Rx oral anti-inflammatories d/t ESRD. Discussed use of voltaren gel. Advised patient of systemic absorption <1% that of oral and that it is extremely low risk, though risk remains. Patient verbalized understanding.  Return in about 4 weeks (around 09/24/2017).

## 2017-09-02 ENCOUNTER — Other Ambulatory Visit: Payer: Self-pay | Admitting: Cardiology

## 2017-09-02 ENCOUNTER — Other Ambulatory Visit: Payer: Self-pay | Admitting: Podiatry

## 2017-09-02 DIAGNOSIS — S93491A Sprain of other ligament of right ankle, initial encounter: Secondary | ICD-10-CM

## 2017-09-16 ENCOUNTER — Other Ambulatory Visit: Payer: Self-pay | Admitting: Cardiology

## 2017-09-22 ENCOUNTER — Encounter (HOSPITAL_COMMUNITY)
Admission: RE | Admit: 2017-09-22 | Discharge: 2017-09-22 | Disposition: A | Discharge status: Home / Self Care | Payer: Medicare Other | Source: Ambulatory Visit | Attending: Nephrology | Admitting: Nephrology

## 2017-09-22 DIAGNOSIS — D631 Anemia in chronic kidney disease: Secondary | ICD-10-CM | POA: Insufficient documentation

## 2017-09-22 DIAGNOSIS — N184 Chronic kidney disease, stage 4 (severe): Secondary | ICD-10-CM

## 2017-09-22 LAB — CBC
HEMATOCRIT: 38.6 % (ref 36.0–46.0)
HEMOGLOBIN: 11.6 g/dL — AB (ref 12.0–15.0)
MCH: 26.3 pg (ref 26.0–34.0)
MCHC: 30.1 g/dL (ref 30.0–36.0)
MCV: 87.5 fL (ref 78.0–100.0)
Platelets: 219 10*3/uL (ref 150–400)
RBC: 4.41 MIL/uL (ref 3.87–5.11)
RDW: 15.2 % (ref 11.5–15.5)
WBC: 12.6 10*3/uL — ABNORMAL HIGH (ref 4.0–10.5)

## 2017-09-22 LAB — IRON AND TIBC
Iron: 57 ug/dL (ref 28–170)
SATURATION RATIOS: 21 % (ref 10.4–31.8)
TIBC: 270 ug/dL (ref 250–450)
UIBC: 213 ug/dL

## 2017-09-22 LAB — COMPREHENSIVE METABOLIC PANEL
ALK PHOS: 70 U/L (ref 38–126)
ALT: 14 U/L (ref 14–54)
AST: 15 U/L (ref 15–41)
Albumin: 3 g/dL — ABNORMAL LOW (ref 3.5–5.0)
Anion gap: 8 (ref 5–15)
BUN: 35 mg/dL — ABNORMAL HIGH (ref 6–20)
CALCIUM: 7.7 mg/dL — AB (ref 8.9–10.3)
CO2: 25 mmol/L (ref 22–32)
CREATININE: 2.45 mg/dL — AB (ref 0.44–1.00)
Chloride: 107 mmol/L (ref 101–111)
GFR calc non Af Amer: 19 mL/min — ABNORMAL LOW (ref >60)
GFR, EST AFRICAN AMERICAN: 22 mL/min — AB (ref >60)
GLUCOSE: 114 mg/dL — AB (ref 65–99)
Potassium: 4.2 mmol/L (ref 3.5–5.1)
SODIUM: 140 mmol/L (ref 135–145)
Total Bilirubin: 0.5 mg/dL (ref 0.3–1.2)
Total Protein: 6.2 g/dL — ABNORMAL LOW (ref 6.5–8.1)

## 2017-09-22 LAB — PHOSPHORUS: Phosphorus: 3.8 mg/dL (ref 2.5–4.6)

## 2017-09-22 LAB — FERRITIN: Ferritin: 256 ng/mL (ref 11–307)

## 2017-09-22 MED ORDER — DARBEPOETIN ALFA 60 MCG/0.3ML IJ SOSY
60.0000 ug | PREFILLED_SYRINGE | INTRAMUSCULAR | Status: DC
  Administered 2017-09-22: 60 ug via SUBCUTANEOUS

## 2017-09-22 MED ORDER — DARBEPOETIN ALFA 60 MCG/0.3ML IJ SOSY
PREFILLED_SYRINGE | INTRAMUSCULAR | Status: AC
  Filled 2017-09-22: qty 0.3

## 2017-09-23 ENCOUNTER — Ambulatory Visit (INDEPENDENT_AMBULATORY_CARE_PROVIDER_SITE_OTHER): Payer: Medicare Other | Admitting: Podiatry

## 2017-09-23 ENCOUNTER — Encounter: Payer: Self-pay | Admitting: Podiatry

## 2017-09-23 DIAGNOSIS — S93491D Sprain of other ligament of right ankle, subsequent encounter: Secondary | ICD-10-CM | POA: Diagnosis not present

## 2017-09-23 NOTE — Progress Notes (Signed)
   Subjective:    Patient ID: Melissa Bartlett, female    DOB: 27-Jun-1946, 71 y.o.   MRN: [id number]  HPI Chief Complaint  Patient presents with  . Foot Problem    i am doing alot better on my right ankle     71 y.o. female returns for the above complaint. Denies pain today. Able to bear weight in regular shoegear without pain. Denies new issues.  Review of Systems     Objective:   Physical Exam There were no vitals filed for this visit. General AA&O x3. Normal mood and affect.  Vascular Dorsalis pedis and posterior tibial pulses  present 2+ bilaterally  Capillary refill normal to all digits. Pedal hair growth normal.  Neurologic Epicritic sensation grossly present.  Dermatologic No open lesions. Interspaces clear of maceration. Nails well groomed and normal in appearance.  Orthopedic: MMT 5/5 in dorsiflexion, plantarflexion, inversion, and eversion. Normal joint ROM without pain or crepitus. No pain on palpation R ATFL, CFL.      Assessment & Plan:  S/p Sprain R ATFL, Improved -No pain on exam today. -Continue WBAT in normal shoegear. -Follow up PRN.

## 2017-09-30 DIAGNOSIS — Z6841 Body Mass Index (BMI) 40.0 and over, adult: Secondary | ICD-10-CM | POA: Diagnosis not present

## 2017-09-30 DIAGNOSIS — M0579 Rheumatoid arthritis with rheumatoid factor of multiple sites without organ or systems involvement: Secondary | ICD-10-CM | POA: Diagnosis not present

## 2017-09-30 DIAGNOSIS — Z79899 Other long term (current) drug therapy: Secondary | ICD-10-CM | POA: Diagnosis not present

## 2017-09-30 DIAGNOSIS — M150 Primary generalized (osteo)arthritis: Secondary | ICD-10-CM | POA: Diagnosis not present

## 2017-09-30 DIAGNOSIS — E790 Hyperuricemia without signs of inflammatory arthritis and tophaceous disease: Secondary | ICD-10-CM | POA: Diagnosis not present

## 2017-09-30 DIAGNOSIS — M2550 Pain in unspecified joint: Secondary | ICD-10-CM | POA: Diagnosis not present

## 2017-10-01 ENCOUNTER — Other Ambulatory Visit: Payer: Self-pay | Admitting: Cardiology

## 2017-10-09 ENCOUNTER — Emergency Department (HOSPITAL_COMMUNITY)
Admission: EM | Admit: 2017-10-09 | Discharge: 2017-10-10 | Disposition: A | Discharge status: Home / Self Care | Payer: Medicare Other | Attending: Emergency Medicine | Admitting: Emergency Medicine

## 2017-10-09 ENCOUNTER — Encounter (HOSPITAL_COMMUNITY): Payer: Self-pay

## 2017-10-09 ENCOUNTER — Emergency Department (HOSPITAL_COMMUNITY): Payer: Medicare Other

## 2017-10-09 DIAGNOSIS — N184 Chronic kidney disease, stage 4 (severe): Secondary | ICD-10-CM | POA: Diagnosis not present

## 2017-10-09 DIAGNOSIS — E1122 Type 2 diabetes mellitus with diabetic chronic kidney disease: Secondary | ICD-10-CM | POA: Diagnosis not present

## 2017-10-09 DIAGNOSIS — Z79899 Other long term (current) drug therapy: Secondary | ICD-10-CM | POA: Diagnosis not present

## 2017-10-09 DIAGNOSIS — R9431 Abnormal electrocardiogram [ECG] [EKG]: Secondary | ICD-10-CM | POA: Diagnosis not present

## 2017-10-09 DIAGNOSIS — Z87891 Personal history of nicotine dependence: Secondary | ICD-10-CM | POA: Diagnosis not present

## 2017-10-09 DIAGNOSIS — R197 Diarrhea, unspecified: Secondary | ICD-10-CM | POA: Diagnosis not present

## 2017-10-09 DIAGNOSIS — Z7901 Long term (current) use of anticoagulants: Secondary | ICD-10-CM | POA: Diagnosis not present

## 2017-10-09 DIAGNOSIS — R531 Weakness: Secondary | ICD-10-CM

## 2017-10-09 DIAGNOSIS — M6281 Muscle weakness (generalized): Secondary | ICD-10-CM | POA: Insufficient documentation

## 2017-10-09 DIAGNOSIS — I129 Hypertensive chronic kidney disease with stage 1 through stage 4 chronic kidney disease, or unspecified chronic kidney disease: Secondary | ICD-10-CM | POA: Insufficient documentation

## 2017-10-09 DIAGNOSIS — E860 Dehydration: Secondary | ICD-10-CM | POA: Diagnosis not present

## 2017-10-09 DIAGNOSIS — J90 Pleural effusion, not elsewhere classified: Secondary | ICD-10-CM | POA: Diagnosis not present

## 2017-10-09 LAB — COMPREHENSIVE METABOLIC PANEL
ALK PHOS: 69 U/L (ref 38–126)
ALT: 16 U/L (ref 14–54)
ANION GAP: 10 (ref 5–15)
AST: 16 U/L (ref 15–41)
Albumin: 3.3 g/dL — ABNORMAL LOW (ref 3.5–5.0)
BILIRUBIN TOTAL: 0.3 mg/dL (ref 0.3–1.2)
BUN: 38 mg/dL — AB (ref 6–20)
CO2: 27 mmol/L (ref 22–32)
Calcium: 7.8 mg/dL — ABNORMAL LOW (ref 8.9–10.3)
Chloride: 105 mmol/L (ref 101–111)
Creatinine, Ser: 3.03 mg/dL — ABNORMAL HIGH (ref 0.44–1.00)
GFR calc Af Amer: 17 mL/min — ABNORMAL LOW (ref >60)
GFR calc non Af Amer: 15 mL/min — ABNORMAL LOW (ref >60)
GLUCOSE: 154 mg/dL — AB (ref 65–99)
POTASSIUM: 5.3 mmol/L — AB (ref 3.5–5.1)
SODIUM: 142 mmol/L (ref 135–145)
Total Protein: 7.3 g/dL (ref 6.5–8.1)

## 2017-10-09 LAB — CBC WITH DIFFERENTIAL/PLATELET
BASOS ABS: 0 10*3/uL (ref 0.0–0.1)
BASOS PCT: 0 %
Eosinophils Absolute: 0.1 10*3/uL (ref 0.0–0.7)
Eosinophils Relative: 1 %
HCT: 36.9 % (ref 36.0–46.0)
HEMOGLOBIN: 10.9 g/dL — AB (ref 12.0–15.0)
LYMPHS ABS: 1.9 10*3/uL (ref 0.7–4.0)
Lymphocytes Relative: 13 %
MCH: 26.8 pg (ref 26.0–34.0)
MCHC: 29.5 g/dL — ABNORMAL LOW (ref 30.0–36.0)
MCV: 90.7 fL (ref 78.0–100.0)
Monocytes Absolute: 1.2 10*3/uL — ABNORMAL HIGH (ref 0.1–1.0)
Monocytes Relative: 8 %
NEUTROS ABS: 11.1 10*3/uL — AB (ref 1.7–7.7)
Neutrophils Relative %: 78 %
PLATELETS: 238 10*3/uL (ref 150–400)
RBC: 4.07 MIL/uL (ref 3.87–5.11)
RDW: 15.9 % — ABNORMAL HIGH (ref 11.5–15.5)
WBC: 14.3 10*3/uL — ABNORMAL HIGH (ref 4.0–10.5)

## 2017-10-09 MED ORDER — SODIUM CHLORIDE 0.9 % IV BOLUS (SEPSIS)
1000.0000 mL | Freq: Once | INTRAVENOUS | Status: AC
  Administered 2017-10-09: 1000 mL via INTRAVENOUS

## 2017-10-09 NOTE — Discharge Instructions (Signed)
As discussed, it is important that you schedule a follow-up visit with your physician this week. Please be sure to discuss today's evaluation including evidence for dehydration, worsening of your kidney function, as well as continued demonstration of pleural effusion on chest x-ray.  Return here for concerning changes in your condition.

## 2017-10-09 NOTE — ED Provider Notes (Signed)
[facility] DEPT Provider Note   CSN: [id number] Arrival date & time: 10/09/17  1552     History   Chief Complaint Chief Complaint  Patient presents with  . Nausea  . Diarrhea  . Fatigue  . Abdominal Pain    HPI Melissa Bartlett is a 71 y.o. female.  HPI  History presents with concern of diarrhea, fatigue, and dyspnea. Patient has multiple medical issues including hypertension, rheumatoid arthritis, and is on chronic steroids. In addition, patient started a new medication several months ago: Leflunomide. Since about the time the patient has had multiple episodes of diarrhea daily. She also has recently developed weakness, possibly over the past 3 or 4 days No focal weakness. Dyspnea has occurred over about the same timeframe per No chest pain, no abdominal pain, though she does have some discomfort with diarrhea. Vomiting. There is associated anorexia. No fever, chills, no cough. Patient is on Xarelto due to history of atrial fibrillation.    Past Medical History:  Diagnosis Date  . CKD (chronic kidney disease), stage IV ([place])   . Diabetes ([place])   . HTN (hypertension)   . OSA (obstructive sleep apnea)    with AHI 11/hr with nocturnal hypoxemia   . PAF (paroxysmal atrial fibrillation) ([place])    s/p DCCV in 8/17  . RA (rheumatoid arthritis) [facility])     Patient Active Problem List   Diagnosis Date Noted  . Syncope 03/14/2017  . Chest pain 03/14/2017  . OSA (obstructive sleep apnea)   . Paroxysmal atrial fibrillation ([place]) 07/31/2016  . CKD (chronic kidney disease) stage 4, GFR 15-29 ml/min ([facility]) 07/31/2016  . Essential hypertension 07/09/2015  . Morbid obesity ([place]) 07/09/2015  . Diabetes ([place]) 03/07/2014    Past Surgical History:  Procedure Laterality Date  . ANKLE FUSION Right   . BREAST BIOPSY    . CARDIOVERSION N/A 08/15/2016   Procedure: CARDIOVERSION;  Surgeon: Jerline Pain, MD;  Location: [facility];  Service: Cardiovascular;  Laterality: N/A;   . GALLBLADDER SURGERY    . HYSTEROTOMY     Patrtial  . HYSTEROTOMY     Complete  . KNEE ARTHROSCOPY      OB History    No data available       Home Medications    Prior to Admission medications   Medication Sig Start Date End Date Taking? Authorizing Provider  amLODipine (NORVASC) 10 MG tablet TAKE 1 TABLET BY MOUTH EVERY DAY 10/02/17   Jerline Pain, MD  Calcium 600-400 MG-UNIT CHEW Chew 1 tablet by mouth 2 (two) times daily.    [provider]  carvedilol (COREG) 25 MG tablet TAKE 1 TABLET BY MOUTH TWICE A DAY WITH A MEAL 09/03/17   Jerline Pain, MD  COMBIGAN 0.2-0.5 % ophthalmic solution 1 drop twice daily both eyes 01/29/14   [provider]  diphenhydramine-acetaminophen (TYLENOL PM) 25-500 MG TABS tablet Take 1 tablet by mouth at bedtime.     [provider]  fluticasone (FLONASE) 50 MCG/ACT nasal spray Place 2 sprays into the nose daily as needed for allergies.     [provider]  folic acid (FOLVITE) 1 MG tablet Take 1 mg by mouth daily. 01/20/17   [provider]  furosemide (LASIX) 40 MG tablet Take 1 tablet (40 mg total) by mouth daily as needed for fluid. 03/16/17   Jerline Pain, MD  KLOR-CON M20 20 MEQ tablet TAKE 1 TABLET BY MOUTH EVERY DAY 10/02/17   Candee Furbish  C, MD  latanoprost (XALATAN) 0.005 % ophthalmic solution Place 1 drop into both eyes at bedtime.  04/26/16   [provider]  leflunomide (ARAVA) 20 MG tablet Take 20 mg by mouth daily.    [provider]  losartan-hydrochlorothiazide (HYZAAR) 100-25 MG tablet TAKE 1 TABLET BY MOUTH EVERY DAY 09/17/17   Jerline Pain, MD  methotrexate (RHEUMATREX) 2.5 MG tablet Take 7.5 mg by mouth 2 (two) times a week. 7.5mg  only on Wednesdays and Thursdays -- Caution:Chemotherapy. Protect from light.    [provider]  predniSONE (DELTASONE) 10 MG tablet Take 5 mg by mouth daily.  12/18/16   [provider]  predniSONE (DELTASONE) 5 MG tablet   08/22/17   [provider]  RHOPRESSA 0.02 % SOLN  06/29/17   [provider]  simvastatin (ZOCOR) 20 MG tablet Take 20 mg by mouth every evening. 06/21/16   [provider]  vitamin B-12 (CYANOCOBALAMIN) 1000 MCG tablet Take 1,000 mcg by mouth daily.    [provider]  XARELTO 15 MG TABS tablet TAKE 1 TABLET BY MOUTH EVERY DAY WITH SUPPER 07/27/17   Jerline Pain, MD    Family History Family History  Problem Relation Age of Onset  . Hypertension Mother        Family HX Diabetes  . Stomach cancer Father     Social History Social History  Substance Use Topics  . Smoking status: Former Smoker    Quit date: 04/08/2016  . Smokeless tobacco: Never Used  . Alcohol use No     Allergies   Enalapril; Codeine; Oxycodone; and Vicodin [hydrocodone-acetaminophen]   Review of Systems Review of Systems  Constitutional:       Per HPI, otherwise negative  HENT:       Per HPI, otherwise negative  Respiratory:       Per HPI, otherwise negative  Cardiovascular:       Per HPI, otherwise negative  Gastrointestinal: Positive for diarrhea. Negative for vomiting.  Endocrine:       Negative aside from HPI  Genitourinary:       Neg aside from HPI   Musculoskeletal:       Per HPI, otherwise negative  Skin: Negative.   Neurological: Positive for weakness. Negative for syncope.     Physical Exam Updated Vital Signs BP (!) 137/104 (BP Location: Left Arm)   Pulse (!) 108   Temp 99.1 F (37.3 C) (Oral)   Resp (!) 22   Ht 5' 2.5" (1.588 m)   LMP  (LMP Unknown)   SpO2 (!) 75%   Physical Exam  Constitutional: She is oriented to person, place, and time. She appears well-developed and well-nourished. No distress.  HENT:  Head: Normocephalic and atraumatic.  Eyes: Conjunctivae and EOM are normal.  Cardiovascular: Normal rate and regular rhythm.   Pulmonary/Chest: Effort normal and breath sounds normal. No stridor. No respiratory distress.  Abdominal: She  exhibits no distension. There is no tenderness. There is no guarding.  Musculoskeletal: She exhibits no edema.  Neurological: She is alert and oriented to person, place, and time. No cranial nerve deficit.  Skin: Skin is warm and dry.  Psychiatric: She has a normal mood and affect.  Nursing note and vitals reviewed.    ED Treatments / Results  Labs (all labs ordered are listed, but only abnormal results are displayed) Labs Reviewed  COMPREHENSIVE METABOLIC PANEL - Abnormal; Notable for the following:       Result Value  Potassium 5.3 (*)    Glucose, Bld 154 (*)    BUN 38 (*)    Creatinine, Ser 3.03 (*)    Calcium 7.8 (*)    Albumin 3.3 (*)    GFR calc non Af Amer 15 (*)    GFR calc Af Amer 17 (*)    All other components within normal limits  CBC WITH DIFFERENTIAL/PLATELET - Abnormal; Notable for the following:    WBC 14.3 (*)    Hemoglobin 10.9 (*)    MCHC 29.5 (*)    RDW 15.9 (*)    Neutro Abs 11.1 (*)    Monocytes Absolute 1.2 (*)    All other components within normal limits  URINALYSIS, ROUTINE W REFLEX MICROSCOPIC     Radiology Dg Chest 2 View  Result Date: 10/09/2017 CLINICAL DATA:  71 year old female with fatigue, nausea, frequent diarrhea. Hypoxia and congestion. EXAM: CHEST  2 VIEW COMPARISON:  03/14/2017 and earlier. FINDINGS: Increased density along the right major fissure most suggesting loculated pleural fluid which has progressed since March. Probable associated pleural fluid in the right minor fissure. Small left pleural effusion also suspected and more apparent. No superimposed consolidation or acute pulmonary edema. Stable mild cardiomegaly. Visualized tracheal air column is within normal limits. No acute osseous abnormality identified. Negative visible bowel gas pattern. No pneumoperitoneum. Stable cholecystectomy clips. IMPRESSION: 1. Small but increased bilateral pleural effusions since March. Fluid tracking into the right lung fissures. 2. No acute  pulmonary edema or other acute cardiopulmonary abnormality. Electronically Signed   By: Genevie Ann M.D.   On: 10/09/2017 20:21    Procedures Procedures (including critical care time)  Medications Ordered in ED Medications - No data to display   Initial Impression / Assessment and Plan / ED Course  I have reviewed the triage vital signs and the nursing notes.  Pertinent labs & imaging results that were available during my care of the patient were reviewed by me and considered in my medical decision making (see chart for details).     11:51 PM After fluid resuscitation the patient states that she feels better, she looks better, and vital signs remained unremarkable. I discussed all findings at length including x-ray demonstrating pleural effusion, and elevated creatinine. Patient acknowledges history of renal dysfunction, and given her ongoing diarrhea, there is some suspicion for dehydration as contributingto today's exacerbation. Patient also has prior evidence of pleural effusion, no evidence for respiratory distress. Given her improvement here after fluid resuscitation, the patient will follow-up with primary care, for initial consideration of her renal dysfunction, pleural effusion. No evidence for concurrent infection, no distress, given the aforementioned improvement, patient discharged with return precautions.  Final Clinical Impressions(s) / ED Diagnoses   Final diagnoses:  Weakness  Dehydration  Diarrhea, unspecified type  Pleural effusion      Carmin Muskrat, MD 10/09/17 2352

## 2017-10-09 NOTE — ED Notes (Signed)
Room air sats-74%. Patient placed on 2L/min-Sats 78%. Patient placed on O2 3L.min-Sats-91%. Patient placed on O2 4L/min via [place]-Sats-93%.

## 2017-10-09 NOTE — ED Triage Notes (Signed)
Patient states she has been having nausea, frequent loose stools and feeling fatigue. Patient states these symptoms started after taking Leflunomide 20 mg daily. Patient called her physician and was told to only take half since yesterday. Patient states she was not getting any better.

## 2017-10-13 ENCOUNTER — Telehealth: Payer: Self-pay | Admitting: Cardiology

## 2017-10-13 DIAGNOSIS — J90 Pleural effusion, not elsewhere classified: Secondary | ICD-10-CM | POA: Diagnosis not present

## 2017-10-13 DIAGNOSIS — Z23 Encounter for immunization: Secondary | ICD-10-CM | POA: Diagnosis not present

## 2017-10-13 DIAGNOSIS — E1122 Type 2 diabetes mellitus with diabetic chronic kidney disease: Secondary | ICD-10-CM | POA: Diagnosis not present

## 2017-10-13 DIAGNOSIS — N184 Chronic kidney disease, stage 4 (severe): Secondary | ICD-10-CM | POA: Diagnosis not present

## 2017-10-13 DIAGNOSIS — R197 Diarrhea, unspecified: Secondary | ICD-10-CM | POA: Diagnosis not present

## 2017-10-13 NOTE — Telephone Encounter (Signed)
Pt states she saw her PCP, Dr Donald Prose, in follow-up from ED visit 10/09/17.  Dr Nancy Fetter asked pt to contact her cardiologist and kidney doctor for recommendations about pleural effusions mentioned on chest xray report done in ED 10/09/17.   Pt advised I will forward to Dr Marlou Porch for review then will follow-up with her after review by Dr Marlou Porch.

## 2017-10-13 NOTE — Telephone Encounter (Signed)
Copied from chest XRAY report 10/09/17 in [facility] ED:  IMPRESSION: 1. Small but increased bilateral pleural effusions since March. Fluid tracking into the right lung fissures. 2. No acute pulmonary edema or other acute cardiopulmonary Abnormality.

## 2017-10-13 NOTE — Telephone Encounter (Signed)
New message    Pt is calling asking for a call back. She said it's about a recent hospital stay. Please call.

## 2017-10-15 MED ORDER — FUROSEMIDE 40 MG PO TABS
40.0000 mg | ORAL_TABLET | Freq: Every day | ORAL | 8 refills | Status: DC
Start: 2017-10-15 — End: 2018-01-30

## 2017-10-15 NOTE — Telephone Encounter (Signed)
Start taking lasix 40 po QD. Candee Furbish, MD

## 2017-10-15 NOTE — Telephone Encounter (Signed)
Spoke with patient who is aware to take Lasix 40 mg every day (was prn).  She will also take her K+ daily as well.  She will c/b if concerns or issues.

## 2017-10-20 ENCOUNTER — Encounter (HOSPITAL_COMMUNITY)
Admission: RE | Admit: 2017-10-20 | Discharge: 2017-10-20 | Disposition: A | Discharge status: Home / Self Care | Payer: Medicare Other | Source: Ambulatory Visit | Attending: Nephrology | Admitting: Nephrology

## 2017-10-20 DIAGNOSIS — H04123 Dry eye syndrome of bilateral lacrimal glands: Secondary | ICD-10-CM | POA: Diagnosis not present

## 2017-10-20 DIAGNOSIS — D631 Anemia in chronic kidney disease: Secondary | ICD-10-CM | POA: Diagnosis not present

## 2017-10-20 DIAGNOSIS — E119 Type 2 diabetes mellitus without complications: Secondary | ICD-10-CM | POA: Diagnosis not present

## 2017-10-20 DIAGNOSIS — N184 Chronic kidney disease, stage 4 (severe): Secondary | ICD-10-CM

## 2017-10-20 DIAGNOSIS — H4053X1 Glaucoma secondary to other eye disorders, bilateral, mild stage: Secondary | ICD-10-CM | POA: Diagnosis not present

## 2017-10-20 LAB — CBC
HEMATOCRIT: 36.9 % (ref 36.0–46.0)
HEMOGLOBIN: 11 g/dL — AB (ref 12.0–15.0)
MCH: 26.8 pg (ref 26.0–34.0)
MCHC: 29.8 g/dL — ABNORMAL LOW (ref 30.0–36.0)
MCV: 89.8 fL (ref 78.0–100.0)
PLATELETS: 281 10*3/uL (ref 150–400)
RBC: 4.11 MIL/uL (ref 3.87–5.11)
RDW: 16.4 % — ABNORMAL HIGH (ref 11.5–15.5)
WBC: 13.4 10*3/uL — AB (ref 4.0–10.5)

## 2017-10-20 MED ORDER — DARBEPOETIN ALFA 60 MCG/0.3ML IJ SOSY
60.0000 ug | PREFILLED_SYRINGE | INTRAMUSCULAR | Status: DC
  Administered 2017-10-20: 60 ug via SUBCUTANEOUS

## 2017-10-20 MED ORDER — DARBEPOETIN ALFA 60 MCG/0.3ML IJ SOSY
PREFILLED_SYRINGE | INTRAMUSCULAR | Status: AC
  Filled 2017-10-20: qty 0.3

## 2017-10-21 DIAGNOSIS — N179 Acute kidney failure, unspecified: Secondary | ICD-10-CM | POA: Diagnosis not present

## 2017-10-21 DIAGNOSIS — E1129 Type 2 diabetes mellitus with other diabetic kidney complication: Secondary | ICD-10-CM | POA: Diagnosis not present

## 2017-10-21 DIAGNOSIS — I129 Hypertensive chronic kidney disease with stage 1 through stage 4 chronic kidney disease, or unspecified chronic kidney disease: Secondary | ICD-10-CM | POA: Diagnosis not present

## 2017-10-21 DIAGNOSIS — N184 Chronic kidney disease, stage 4 (severe): Secondary | ICD-10-CM | POA: Diagnosis not present

## 2017-10-21 DIAGNOSIS — I480 Paroxysmal atrial fibrillation: Secondary | ICD-10-CM | POA: Diagnosis not present

## 2017-10-21 DIAGNOSIS — D631 Anemia in chronic kidney disease: Secondary | ICD-10-CM | POA: Diagnosis not present

## 2017-10-21 DIAGNOSIS — R801 Persistent proteinuria, unspecified: Secondary | ICD-10-CM | POA: Diagnosis not present

## 2017-10-21 DIAGNOSIS — E669 Obesity, unspecified: Secondary | ICD-10-CM | POA: Diagnosis not present

## 2017-10-21 DIAGNOSIS — M069 Rheumatoid arthritis, unspecified: Secondary | ICD-10-CM | POA: Diagnosis not present

## 2017-10-27 ENCOUNTER — Observation Stay (HOSPITAL_COMMUNITY)
Admission: EM | Admit: 2017-10-27 | Discharge: 2017-10-28 | Disposition: A | Discharge status: Home / Self Care | Payer: Medicare Other | Attending: Internal Medicine | Admitting: Internal Medicine

## 2017-10-27 ENCOUNTER — Encounter (HOSPITAL_COMMUNITY): Payer: Self-pay | Admitting: Emergency Medicine

## 2017-10-27 ENCOUNTER — Emergency Department (HOSPITAL_COMMUNITY): Payer: Medicare Other

## 2017-10-27 DIAGNOSIS — I129 Hypertensive chronic kidney disease with stage 1 through stage 4 chronic kidney disease, or unspecified chronic kidney disease: Secondary | ICD-10-CM | POA: Diagnosis not present

## 2017-10-27 DIAGNOSIS — G4733 Obstructive sleep apnea (adult) (pediatric): Secondary | ICD-10-CM | POA: Diagnosis present

## 2017-10-27 DIAGNOSIS — Z79899 Other long term (current) drug therapy: Secondary | ICD-10-CM | POA: Insufficient documentation

## 2017-10-27 DIAGNOSIS — R079 Chest pain, unspecified: Secondary | ICD-10-CM | POA: Diagnosis not present

## 2017-10-27 DIAGNOSIS — Z7901 Long term (current) use of anticoagulants: Secondary | ICD-10-CM | POA: Insufficient documentation

## 2017-10-27 DIAGNOSIS — E1122 Type 2 diabetes mellitus with diabetic chronic kidney disease: Secondary | ICD-10-CM | POA: Diagnosis not present

## 2017-10-27 DIAGNOSIS — I10 Essential (primary) hypertension: Secondary | ICD-10-CM | POA: Diagnosis present

## 2017-10-27 DIAGNOSIS — Z87891 Personal history of nicotine dependence: Secondary | ICD-10-CM | POA: Diagnosis not present

## 2017-10-27 DIAGNOSIS — R110 Nausea: Secondary | ICD-10-CM | POA: Diagnosis not present

## 2017-10-27 DIAGNOSIS — I480 Paroxysmal atrial fibrillation: Secondary | ICD-10-CM | POA: Diagnosis present

## 2017-10-27 DIAGNOSIS — E1129 Type 2 diabetes mellitus with other diabetic kidney complication: Secondary | ICD-10-CM

## 2017-10-27 DIAGNOSIS — N184 Chronic kidney disease, stage 4 (severe): Secondary | ICD-10-CM | POA: Diagnosis present

## 2017-10-27 LAB — BASIC METABOLIC PANEL
Anion gap: 9 (ref 5–15)
BUN: 35 mg/dL — AB (ref 6–20)
CHLORIDE: 106 mmol/L (ref 101–111)
CO2: 26 mmol/L (ref 22–32)
Calcium: 7.7 mg/dL — ABNORMAL LOW (ref 8.9–10.3)
Creatinine, Ser: 2.93 mg/dL — ABNORMAL HIGH (ref 0.44–1.00)
GFR calc Af Amer: 17 mL/min — ABNORMAL LOW (ref >60)
GFR calc non Af Amer: 15 mL/min — ABNORMAL LOW (ref >60)
Glucose, Bld: 123 mg/dL — ABNORMAL HIGH (ref 65–99)
POTASSIUM: 4 mmol/L (ref 3.5–5.1)
Sodium: 141 mmol/L (ref 135–145)

## 2017-10-27 LAB — CBC
HEMATOCRIT: 38.7 % (ref 36.0–46.0)
Hemoglobin: 11.2 g/dL — ABNORMAL LOW (ref 12.0–15.0)
MCH: 26.4 pg (ref 26.0–34.0)
MCHC: 28.9 g/dL — AB (ref 30.0–36.0)
MCV: 91.3 fL (ref 78.0–100.0)
Platelets: 245 10*3/uL (ref 150–400)
RBC: 4.24 MIL/uL (ref 3.87–5.11)
RDW: 16.4 % — AB (ref 11.5–15.5)
WBC: 12.6 10*3/uL — ABNORMAL HIGH (ref 4.0–10.5)

## 2017-10-27 LAB — PROTIME-INR
INR: 1.48
PROTHROMBIN TIME: 17.8 s — AB (ref 11.4–15.2)

## 2017-10-27 LAB — I-STAT TROPONIN, ED: Troponin i, poc: 0 ng/mL (ref 0.00–0.08)

## 2017-10-27 MED ORDER — ASPIRIN 81 MG PO CHEW
324.0000 mg | CHEWABLE_TABLET | Freq: Once | ORAL | Status: AC
  Administered 2017-10-27: 324 mg via ORAL
  Filled 2017-10-27: qty 4

## 2017-10-27 NOTE — ED Provider Notes (Signed)
[facility] EMERGENCY DEPARTMENT Provider Note   CSN: [id number] Arrival date & time: 10/27/17  2047     History   Chief Complaint Chief Complaint  Patient presents with  . Chest Pain    HPI Melissa Bartlett is a 71 y.o. female.  Patient with history of atrial fibrillation on Xarelto, diabetes, hypertension, CKD presenting with intermittent left-sided chest pain since yesterday.  Pain is sharp and burning and radiates down her left arm.  It lasts for a few seconds to a few minutes at a time.  Associated with nausea.  No shortness of breath, diaphoresis, chills, cough or fever.  She has not had this pain in the past.  She thinks her last stress test was about 4 years ago.  She denies any CAD history or stents.  Pain is not exertional and comes on with rest.  Denies any leg pain or leg swelling.   The history is provided by the patient.  Chest Pain   Associated symptoms include nausea. Pertinent negatives include no abdominal pain, no dizziness, no fever, no shortness of breath, no vomiting and no weakness.    Past Medical History:  Diagnosis Date  . CKD (chronic kidney disease), stage IV ([place])   . Diabetes ([place])   . HTN (hypertension)   . OSA (obstructive sleep apnea)    with AHI 11/hr with nocturnal hypoxemia   . PAF (paroxysmal atrial fibrillation) ([place])    s/p DCCV in 8/17  . RA (rheumatoid arthritis) [facility])     Patient Active Problem List   Diagnosis Date Noted  . Syncope 03/14/2017  . Chest pain 03/14/2017  . OSA (obstructive sleep apnea)   . Paroxysmal atrial fibrillation ([place]) 07/31/2016  . CKD (chronic kidney disease) stage 4, GFR 15-29 ml/min (HCC) 07/31/2016  . Essential hypertension 07/09/2015  . Morbid obesity ([place]) 07/09/2015  . Diabetes ([place]) 03/07/2014    Past Surgical History:  Procedure Laterality Date  . ANKLE FUSION Right   . BREAST BIOPSY    . CARDIOVERSION N/A 08/15/2016   Procedure: CARDIOVERSION;  Surgeon: Jerline Pain,  MD;  Location: [facility] ENDOSCOPY;  Service: Cardiovascular;  Laterality: N/A;  . CHOLECYSTECTOMY    . GALLBLADDER SURGERY    . HYSTEROTOMY     Patrtial  . HYSTEROTOMY     Complete  . KNEE ARTHROSCOPY      OB History    No data available       Home Medications    Prior to Admission medications   Medication Sig Start Date End Date Taking? Authorizing Provider  amLODipine (NORVASC) 10 MG tablet TAKE 1 TABLET BY MOUTH EVERY DAY 10/02/17  Yes Jerline Pain, MD  Calcium Carbonate-Vitamin D3 (CALCIUM 600/VITAMIN D) 600-400 MG-UNIT TABS Take 1 tablet by mouth 2 (two) times daily.   Yes [provider]  carvedilol (COREG) 25 MG tablet TAKE 1 TABLET BY MOUTH TWICE A DAY WITH A MEAL 09/03/17  Yes Skains, Thana Farr, MD  COMBIGAN 0.2-0.5 % ophthalmic solution Place 1 drop into both eyes 2 (two) times daily.  01/29/14  Yes [provider]  diphenhydramine-acetaminophen (TYLENOL PM) 25-500 MG TABS tablet Take 1 tablet by mouth at bedtime.    Yes [provider]  fluticasone (FLONASE) 50 MCG/ACT nasal spray Place 1-2 sprays into both nostrils daily as needed for allergies or rhinitis.   Yes [provider]  furosemide (LASIX) 40 MG tablet Take 1 tablet (40 mg total) by mouth daily. 10/15/17  Yes Jerline Pain, MD  KLOR-CON M20 20 MEQ tablet TAKE 1 TABLET BY MOUTH EVERY DAY 10/02/17  Yes Jerline Pain, MD  leflunomide (ARAVA) 20 MG tablet Take 10 mg by mouth daily.    Yes [provider]  losartan-hydrochlorothiazide (HYZAAR) 100-25 MG tablet TAKE 1 TABLET BY MOUTH EVERY DAY 09/17/17  Yes Jerline Pain, MD  predniSONE (DELTASONE) 5 MG tablet Take 5 mg by mouth daily.  08/22/17  Yes [provider]  Probiotic Product (ALIGN PO) Take 1 tablet by mouth daily.   Yes [provider]  simvastatin (ZOCOR) 20 MG tablet Take 20 mg by mouth every evening. 06/21/16  Yes [provider]  vitamin B-12 (CYANOCOBALAMIN) 1000 MCG tablet Take 1,000 mcg by  mouth daily.   Yes [provider]  XARELTO 15 MG TABS tablet TAKE 1 TABLET BY MOUTH EVERY DAY WITH SUPPER 07/27/17  Yes Jerline Pain, MD    Family History Family History  Problem Relation Age of Onset  . Hypertension Mother        Family HX Diabetes  . Stomach cancer Father     Social History Social History  Substance Use Topics  . Smoking status: Former Smoker    Quit date: 04/08/2016  . Smokeless tobacco: Never Used  . Alcohol use No     Allergies   Enalapril; Codeine; Oxycodone; and Vicodin [hydrocodone-acetaminophen]   Review of Systems Review of Systems  Constitutional: Negative for activity change, appetite change and fever.  HENT: Negative for congestion and rhinorrhea.   Respiratory: Positive for chest tightness. Negative for shortness of breath.   Cardiovascular: Positive for chest pain.  Gastrointestinal: Positive for nausea. Negative for abdominal pain and vomiting.  Genitourinary: Negative for vaginal bleeding and vaginal discharge.  Musculoskeletal: Negative for arthralgias and myalgias.  Skin: Negative for wound.  Neurological: Negative for dizziness and weakness.    all other systems are negative except as noted in the HPI and PMH.    Physical Exam Updated Vital Signs BP 139/77   Pulse 87   Temp 98.5 F (36.9 C) (Oral)   Resp 11   Ht 5\' 3"  (1.6 m)   Wt 115.7 kg (255 lb)   LMP  (LMP Unknown)   SpO2 95%   BMI 45.17 kg/m   Physical Exam  Constitutional: She is oriented to person, place, and time. She appears well-developed and well-nourished. No distress.  HENT:  Head: Normocephalic and atraumatic.  Mouth/Throat: Oropharynx is clear and moist. No oropharyngeal exudate.  Eyes: Pupils are equal, round, and reactive to light. Conjunctivae and EOM are normal.  Neck: Normal range of motion. Neck supple.  No meningismus.  Cardiovascular: Normal rate, normal heart sounds and intact distal pulses.   No murmur heard. Irregular rhythm    Pulmonary/Chest: Effort normal and breath sounds normal. No respiratory distress. She exhibits tenderness.  Chest wall tenderness does not reproduce pain  Abdominal: Soft. There is no tenderness. There is no rebound and no guarding.  Musculoskeletal: Normal range of motion. She exhibits no edema or tenderness.  Neurological: She is alert and oriented to person, place, and time. No cranial nerve deficit. She exhibits normal muscle tone. Coordination normal.   5/5 strength throughout. CN 2-12 intact.Equal grip strength.   Skin: Skin is warm.  Psychiatric: She has a normal mood and affect. Her behavior is normal.  Nursing note and vitals reviewed.    ED Treatments / Results  Labs (all labs ordered are listed, but  only abnormal results are displayed) Labs Reviewed  BASIC METABOLIC PANEL - Abnormal; Notable for the following:       Result Value   Glucose, Bld 123 (*)    BUN 35 (*)    Creatinine, Ser 2.93 (*)    Calcium 7.7 (*)    GFR calc non Af Amer 15 (*)    GFR calc Af Amer 17 (*)    All other components within normal limits  CBC - Abnormal; Notable for the following:    WBC 12.6 (*)    Hemoglobin 11.2 (*)    MCHC 28.9 (*)    RDW 16.4 (*)    All other components within normal limits  PROTIME-INR - Abnormal; Notable for the following:    Prothrombin Time 17.8 (*)    All other components within normal limits  I-STAT TROPONIN, ED    EKG  EKG Interpretation  Date/Time:  Tuesday October 27 2017 20:52:09 EDT Ventricular Rate:  110 PR Interval:    QRS Duration: 68 QT Interval:  304 QTC Calculation: 411 R Axis:   44 Text Interpretation:  Atrial fibrillation with rapid ventricular response Low voltage QRS Cannot rule out Anterior infarct , age undetermined Abnormal ECG No significant change was found Confirmed by Ezequiel Essex [phone]) on 10/27/2017 10:48:08 PM       Radiology Dg Chest 2 View  Result Date: 10/27/2017 CLINICAL DATA:  71 year old female with chest pain.  EXAM: CHEST  2 VIEW COMPARISON:  Chest radiograph dated 10/09/2017 FINDINGS: There has been interval improvement or near complete resolution of the pleural effusions seen on the prior radiograph. Trace effusion may be present in the right major fissure. There is no focal consolidation, or pneumothorax. The cardiac silhouette is within normal limits. No acute osseous pathology. IMPRESSION: Interval resolution of the previously seen pleural effusions. Trace remaining pleural effusion in the right fissure. No focal consolidation. Electronically Signed   By: Anner Crete M.D.   On: 10/27/2017 21:22    Procedures Procedures (including critical care time)  Medications Ordered in ED Medications  aspirin chewable tablet 324 mg (324 mg Oral Given 10/27/17 2320)     Initial Impression / Assessment and Plan / ED Course  I have reviewed the triage vital signs and the nursing notes.  Pertinent labs & imaging results that were available during my care of the patient were reviewed by me and considered in my medical decision making (see chart for details).    Patient with L sided chest pain since yesterday that radiates down L arm. Associated with nausea.  EKG unchanged. Troponin is negative.  Heart score is 5.  Description somewhat atypical for ACS Creatinine near baseline  Patient does have pain to palpation of her chest but states this is different than the pain she is describing.  Troponin is negative.  Description seems atypical for ACS but she does have risk factors.  Doubt pulmonary embolism, doubt aortic dissection.  Observation admission discussed with Dr. Alcario Drought. Final Clinical Impressions(s) / ED Diagnoses   Final diagnoses:  Chest pain, unspecified type    New Prescriptions New Prescriptions   No medications on file     Ezequiel Essex, MD 10/28/17 [phone]

## 2017-10-27 NOTE — ED Triage Notes (Signed)
Pt reports L sided CP with radiation to L arm onset yesterday. Intermittent, 4/10, tight in nature. Also reports nausea.

## 2017-10-28 ENCOUNTER — Other Ambulatory Visit: Payer: Self-pay | Admitting: Physician Assistant

## 2017-10-28 DIAGNOSIS — I10 Essential (primary) hypertension: Secondary | ICD-10-CM | POA: Diagnosis not present

## 2017-10-28 DIAGNOSIS — R079 Chest pain, unspecified: Secondary | ICD-10-CM

## 2017-10-28 DIAGNOSIS — I480 Paroxysmal atrial fibrillation: Secondary | ICD-10-CM | POA: Diagnosis not present

## 2017-10-28 DIAGNOSIS — N184 Chronic kidney disease, stage 4 (severe): Secondary | ICD-10-CM

## 2017-10-28 LAB — I-STAT TROPONIN, ED: Troponin i, poc: 0.01 ng/mL (ref 0.00–0.08)

## 2017-10-28 MED ORDER — RIVAROXABAN 15 MG PO TABS: 15.0000 mg | ORAL_TABLET | Freq: Every day | ORAL | Status: DC

## 2017-10-28 MED ORDER — BRIMONIDINE TARTRATE 0.2 % OP SOLN
1.0000 [drp] | Freq: Two times a day (BID) | OPHTHALMIC | Status: DC
  Administered 2017-10-28: 1 [drp] via OPHTHALMIC
  Filled 2017-10-28: qty 5

## 2017-10-28 MED ORDER — CALCIUM CARBONATE-VITAMIN D 500-200 MG-UNIT PO TABS
1.0000 | ORAL_TABLET | Freq: Two times a day (BID) | ORAL | Status: DC
  Administered 2017-10-28: 09:00:00 1 via ORAL
  Filled 2017-10-28: qty 1

## 2017-10-28 MED ORDER — SIMVASTATIN 20 MG PO TABS: 20.0000 mg | ORAL_TABLET | Freq: Every evening | ORAL | Status: DC

## 2017-10-28 MED ORDER — LOSARTAN POTASSIUM-HCTZ 100-25 MG PO TABS: 1.0000 | ORAL_TABLET | Freq: Every day | ORAL | Status: DC

## 2017-10-28 MED ORDER — ONDANSETRON HCL 4 MG/2ML IJ SOLN: 4.0000 mg | Freq: Four times a day (QID) | INTRAMUSCULAR | Status: DC | PRN

## 2017-10-28 MED ORDER — LEFLUNOMIDE 20 MG PO TABS
10.0000 mg | ORAL_TABLET | Freq: Every day | ORAL | Status: DC
  Administered 2017-10-28: 10 mg via ORAL
  Filled 2017-10-28: qty 1

## 2017-10-28 MED ORDER — PREDNISONE 5 MG PO TABS
5.0000 mg | ORAL_TABLET | Freq: Every day | ORAL | Status: DC
Start: 2017-10-28 — End: 2017-10-28
  Administered 2017-10-28: 5 mg via ORAL
  Filled 2017-10-28: qty 1

## 2017-10-28 MED ORDER — ACETAMINOPHEN 325 MG PO TABS: 650.0000 mg | ORAL_TABLET | ORAL | Status: DC | PRN

## 2017-10-28 MED ORDER — CARVEDILOL 25 MG PO TABS
25.0000 mg | ORAL_TABLET | Freq: Two times a day (BID) | ORAL | Status: DC
  Administered 2017-10-28: 25 mg via ORAL
  Filled 2017-10-28: qty 2

## 2017-10-28 MED ORDER — BRIMONIDINE TARTRATE-TIMOLOL 0.2-0.5 % OP SOLN: 1.0000 [drp] | Freq: Two times a day (BID) | OPHTHALMIC | Status: DC

## 2017-10-28 MED ORDER — POTASSIUM CHLORIDE CRYS ER 20 MEQ PO TBCR
20.0000 meq | EXTENDED_RELEASE_TABLET | Freq: Every day | ORAL | Status: DC
  Administered 2017-10-28: 20 meq via ORAL
  Filled 2017-10-28: qty 1

## 2017-10-28 MED ORDER — PANTOPRAZOLE SODIUM 40 MG PO TBEC
40.0000 mg | DELAYED_RELEASE_TABLET | Freq: Every day | ORAL | Status: DC
  Administered 2017-10-28: 40 mg via ORAL
  Filled 2017-10-28: qty 1

## 2017-10-28 MED ORDER — VITAMIN B-12 1000 MCG PO TABS
1000.0000 ug | ORAL_TABLET | Freq: Every day | ORAL | Status: DC
  Administered 2017-10-28: 1000 ug via ORAL
  Filled 2017-10-28: qty 1

## 2017-10-28 MED ORDER — AMLODIPINE BESYLATE 10 MG PO TABS
10.0000 mg | ORAL_TABLET | Freq: Every day | ORAL | Status: DC
  Administered 2017-10-28: 10 mg via ORAL
  Filled 2017-10-28: qty 1

## 2017-10-28 MED ORDER — PANTOPRAZOLE SODIUM 40 MG PO TBEC: 40.0000 mg | DELAYED_RELEASE_TABLET | Freq: Every day | ORAL | 0 refills | Status: AC

## 2017-10-28 MED ORDER — TIMOLOL MALEATE 0.5 % OP SOLN
1.0000 [drp] | Freq: Two times a day (BID) | OPHTHALMIC | Status: DC
  Administered 2017-10-28: 1 [drp] via OPHTHALMIC
  Filled 2017-10-28: qty 5

## 2017-10-28 MED ORDER — GI COCKTAIL ~~LOC~~
30.0000 mL | Freq: Three times a day (TID) | ORAL | Status: DC | PRN
  Administered 2017-10-28 (×2): 30 mL via ORAL
  Filled 2017-10-28: qty 30

## 2017-10-28 MED ORDER — FUROSEMIDE 40 MG PO TABS
40.0000 mg | ORAL_TABLET | Freq: Every day | ORAL | Status: DC
  Administered 2017-10-28: 40 mg via ORAL
  Filled 2017-10-28: qty 1

## 2017-10-28 MED ORDER — FLUTICASONE PROPIONATE 50 MCG/ACT NA SUSP: 1.0000 | Freq: Every day | NASAL | Status: DC | PRN

## 2017-10-28 NOTE — ED Notes (Signed)
GI cocktail given, will scan in once provider is finished; Dr.Gardner at bedside

## 2017-10-28 NOTE — Consult Note (Signed)
Cardiology Consultation:   Patient ID: Melissa Bartlett; [id number]; 08-05-1946   Admit date: 10/27/2017 Date of Consult: 10/28/2017  Primary Care Provider: Donald Prose, MD Primary Cardiologist: Dr. Marlou Porch (TT for OSA) Primary Electrophysiologist:     Patient Profile:   Melissa Bartlett is a 72 y.o. female with a hx of difficult to control HTN, DM, OSA, PAF on 07/08/16 s/p DCCV 07/2016, now permanent Afib (on xarelto), CKD stage IV, RA who is being seen today for the evaluation of chest pain at the request of Dr. Eliseo Squires.  History of Present Illness:   Melissa Bartlett is known to this service and last saw Dr. Marlou Porch in clinic on 05/12/17. At that time, she was in her usual state of health, no medication changes.   She was recently hospitalized 02/2017 for atypical chest pain, syncope, N/V, and diarrhea. Troponins were negative and EF was normal. Her PCP had ordered daily lasix for swelling associated with chronic steroid use. She decreased to PRN and had a significant weight gain.  She was seen back in the ED on 10/09/17 for nausea and diarrhea. She was rehydrated with IVF, felt much better, and discharged home.   She presented back to [facility] after 6-7 episodes of sharp chest pain located in her left chest that last 2-3 seconds at a time.  She denies associated shortness of breath, dizziness, palpitations, syncope, lower extremity swelling, nausea and vomiting.  She states that she has had these pains before but did not seek medical evaluation.  She reports doing household chores without anginal symptoms or dyspnea on exertion.  She takes Lasix daily for chronic prednisone use for her RA.  Currently she is resting in bed pain-free.  On my exam, chest pain was reproducible with palpation over her left chest.  Troponins have been negative, EKG unchanged.  Recent echo in March of this year with normal LV function.  She has risk factors for CAD including hypertension, OSA, diabetes, and  obesity.    Past Medical History:  Diagnosis Date  . CKD (chronic kidney disease), stage IV ([place])   . Diabetes ([place])   . HTN (hypertension)   . OSA (obstructive sleep apnea)    with AHI 11/hr with nocturnal hypoxemia   . PAF (paroxysmal atrial fibrillation) ([place])    s/p DCCV in 8/17  . RA (rheumatoid arthritis) ([place])     Past Surgical History:  Procedure Laterality Date  . ANKLE FUSION Right   . BREAST BIOPSY    . CARDIOVERSION N/A 08/15/2016   Procedure: CARDIOVERSION;  Surgeon: Jerline Pain, MD;  Location: [facility] ENDOSCOPY;  Service: Cardiovascular;  Laterality: N/A;  . CHOLECYSTECTOMY    . GALLBLADDER SURGERY    . HYSTEROTOMY     Patrtial  . HYSTEROTOMY     Complete  . KNEE ARTHROSCOPY       Home Medications:  Prior to Admission medications   Medication Sig Start Date End Date Taking? Authorizing Provider  amLODipine (NORVASC) 10 MG tablet TAKE 1 TABLET BY MOUTH EVERY DAY 10/02/17  Yes Jerline Pain, MD  Calcium Carbonate-Vitamin D3 (CALCIUM 600/VITAMIN D) 600-400 MG-UNIT TABS Take 1 tablet by mouth 2 (two) times daily.   Yes [provider]  carvedilol (COREG) 25 MG tablet TAKE 1 TABLET BY MOUTH TWICE A DAY WITH A MEAL 09/03/17  Yes Skains, Thana Farr, MD  COMBIGAN 0.2-0.5 % ophthalmic solution Place 1 drop into both eyes 2 (two) times daily.  01/29/14  Yes [provider]  diphenhydramine-acetaminophen (TYLENOL PM) 25-500 MG TABS tablet Take 1 tablet by mouth at bedtime.    Yes [provider]  fluticasone (FLONASE) 50 MCG/ACT nasal spray Place 1-2 sprays into both nostrils daily as needed for allergies or rhinitis.   Yes [provider]  furosemide (LASIX) 40 MG tablet Take 1 tablet (40 mg total) by mouth daily. 10/15/17  Yes Jerline Pain, MD  KLOR-CON M20 20 MEQ tablet TAKE 1 TABLET BY MOUTH EVERY DAY 10/02/17  Yes Jerline Pain, MD  leflunomide (ARAVA) 20 MG tablet Take 10 mg by mouth daily.    Yes [provider]   losartan-hydrochlorothiazide (HYZAAR) 100-25 MG tablet TAKE 1 TABLET BY MOUTH EVERY DAY 09/17/17  Yes Jerline Pain, MD  predniSONE (DELTASONE) 5 MG tablet Take 5 mg by mouth daily.  08/22/17  Yes [provider]  Probiotic Product (ALIGN PO) Take 1 tablet by mouth daily.   Yes [provider]  simvastatin (ZOCOR) 20 MG tablet Take 20 mg by mouth every evening. 06/21/16  Yes [provider]  vitamin B-12 (CYANOCOBALAMIN) 1000 MCG tablet Take 1,000 mcg by mouth daily.   Yes [provider]  XARELTO 15 MG TABS tablet TAKE 1 TABLET BY MOUTH EVERY DAY WITH SUPPER 07/27/17  Yes Jerline Pain, MD    Inpatient Medications: Scheduled Meds: . amLODipine  10 mg Oral Daily  . brimonidine  1 drop Both Eyes BID   And  . timolol  1 drop Both Eyes BID  . calcium-vitamin D  1 tablet Oral BID WC  . carvedilol  25 mg Oral BID WC  . furosemide  40 mg Oral Daily  . leflunomide  10 mg Oral Daily  . potassium chloride SA  20 mEq Oral Daily  . predniSONE  5 mg Oral Q breakfast  . Rivaroxaban  15 mg Oral QAC supper  . simvastatin  20 mg Oral QPM  . vitamin B-12  1,000 mcg Oral Daily   Continuous Infusions:  PRN Meds: acetaminophen, fluticasone, gi cocktail, ondansetron (ZOFRAN) IV  Allergies:    Allergies  Allergen Reactions  . Enalapril Other (See Comments)    Systemic reaction - jerking  . Codeine Itching  . Oxycodone Itching  . Vicodin [Hydrocodone-Acetaminophen] Itching    Social History:   Social History   Social History  . Marital status: Married    Spouse name: N/A  . Number of children: N/A  . Years of education: N/A   Occupational History  . Not on file.   Social History Main Topics  . Smoking status: Former Smoker    Quit date: 04/08/2016  . Smokeless tobacco: Never Used  . Alcohol use No  . Drug use: No  . Sexual activity: Not on file   Other Topics Concern  . Not on file   Social History Narrative  . No narrative on file     Family History:    Family History  Problem Relation Age of Onset  . Hypertension Mother        Family HX Diabetes  . Stomach cancer Father      ROS:  Please see the history of present illness.  ROS  All other ROS reviewed and negative.     Physical Exam/Data:   Vitals:   10/28/17 0145 10/28/17 0200 10/28/17 0258 10/28/17 0532  BP:  (!) 164/85 (!) 142/84 (!) 146/71  Pulse: 95 87 94 87  Resp: 20 19  17   Temp:   Marland Kitchen)  97.4 F (36.3 C) 97.6 F (36.4 C)  TempSrc:   Oral Oral  SpO2: 96% 96% 100% 100%  Weight:   255 lb 9.6 oz (115.9 kg) 255 lb 9.6 oz (115.9 kg)  Height:   5\' 2"  (1.575 m)     Intake/Output Summary (Last 24 hours) at 10/28/17 0704 Last data filed at 10/28/17 0500  Gross per 24 hour  Intake                0 ml  Output                1 ml  Net               -1 ml   Filed Weights   10/27/17 2051 10/28/17 0258 10/28/17 0532  Weight: 255 lb (115.7 kg) 255 lb 9.6 oz (115.9 kg) 255 lb 9.6 oz (115.9 kg)   Body mass index is 46.75 kg/m.  General:  Obese female, in no acute distress HEENT: normal Neck: no JVD Vascular: No carotid bruits Cardiac:  normal S1, S2; RRR; no murmur, exam difficult due to body habitus - significant pain with palpation over her left chest/breast Lungs:  clear to auscultation bilaterally, no wheezing, rhonchi or rales  Abd: soft, nontender, no hepatomegaly  Ext: no edema Musculoskeletal:  No deformities, BUE and BLE strength normal and equal Skin: warm and dry  Neuro:  CNs 2-12 intact, no focal abnormalities noted Psych:  Normal affect   EKG:  The EKG was personally reviewed and demonstrates:  Afib Telemetry:  Telemetry was personally reviewed and demonstrates:  Afib rate controlled  Relevant CV Studies:  Echocardiogram 03/15/17: Study Conclusions - Left ventricle: The cavity size was normal. There was mild   concentric hypertrophy. Systolic function was vigorous. The   estimated ejection fraction was in the range of 65% to 70%.  Wall   motion was normal; there were no regional wall motion   abnormalities. Doppler parameters are consistent with high   ventricular filling pressure. - Aortic valve: There was mild regurgitation. - Mitral valve: There was trivial regurgitation. Valve area by   continuity equation (using LVOT flow): 2.49 cm^2. - Left atrium: The atrium was moderately dilated. - Pulmonary arteries: Systolic pressure could not be accurately   estimated.  Laboratory Data:  Chemistry  Recent Labs Lab 10/27/17 2108  NA 141  K 4.0  CL 106  CO2 26  GLUCOSE 123*  BUN 35*  CREATININE 2.93*  CALCIUM 7.7*  GFRNONAA 15*  GFRAA 17*  ANIONGAP 9    No results for input(s): PROT, ALBUMIN, AST, ALT, ALKPHOS, BILITOT in the last 168 hours. Hematology  Recent Labs Lab 10/27/17 2108  WBC 12.6*  RBC 4.24  HGB 11.2*  HCT 38.7  MCV 91.3  MCH 26.4  MCHC 28.9*  RDW 16.4*  PLT 245   Cardiac EnzymesNo results for input(s): TROPONINI in the last 168 hours.   Recent Labs Lab 10/27/17 2121 10/28/17 0101  TROPIPOC 0.00 0.01    BNPNo results for input(s): BNP, PROBNP in the last 168 hours.  DDimer No results for input(s): DDIMER in the last 168 hours.  Radiology/Studies:  Dg Chest 2 View  Result Date: 10/27/2017 CLINICAL DATA:  71 year old female with chest pain. EXAM: CHEST  2 VIEW COMPARISON:  Chest radiograph dated 10/09/2017 FINDINGS: There has been interval improvement or near complete resolution of the pleural effusions seen on the prior radiograph. Trace effusion may be present in the right major fissure. There  is no focal consolidation, or pneumothorax. The cardiac silhouette is within normal limits. No acute osseous pathology. IMPRESSION: Interval resolution of the previously seen pleural effusions. Trace remaining pleural effusion in the right fissure. No focal consolidation. Electronically Signed   By: Anner Crete M.D.   On: 10/27/2017 21:22    Assessment and Plan:   1. Chest  pain - troponin x 2 negative - EKG rate controlled Afib with poor R wave progression Her GRACE score is 108 and her TIMI score is 2. She has risk factors for CAD including hypertension, diabetes, obesity, OSA, and she is a former smoker.  This chest pain sounds atypical in nature, I have low suspicion for ongoing ACS.  No need to repeat an echocardiogram as she has just had one in March of this year.  Will discuss with attending the need for inpatient ischemic evaluation via possible Myoview stress test versus an outpatient evaluation.  Of note she had significant pain on palpation over her left chest during exam.   2.  Hypertension -Pressures this admission have been in the 400 systolic -Continue home meds of Norvasc, Hyzaar, and Coreg -Continue daily Lasix for lower extremity swelling likely secondary to chronic steroid use for her RA   3.  Permanent atrial fibrillation, rate controlled -Continue Coreg and Xarelto for anticoagulation - This patients CHA2DS2-VASc Score and unadjusted Ischemic Stroke Rate (% per year) is equal to 4.8 % stroke rate/year from a score of 4 (hypertension, diabetes, age, female) -She is tolerating the rhythm well and is hemodynamically stable    For questions or updates, please contact [place] Please consult [email or web address] for contact info under Cardiology/STEMI.   Signed, Tami Lin Duke, PA  10/28/2017 7:04 AM  History and all data above reviewed.  Patient examined.  I agree with the findings as above. The patient presents with chest pain.  This is somewhat atypical but she does have cardiovascular risk factors and scores as above.  No objective evidence of ischemia this admission.   The patient exam reveals COR:RRR  ,  Lungs: Clear  ,  Abd: Positive bowel sounds, no rebound no guarding, Ext No edema  .  All available labs, radiology testing, previous records reviewed. Agree with documented assessment and plan. Chest pain:  Atypical but significant  risk factors.  Stress testing is indicated.  The added sensitivity of imaging is indicated.  I will plan out patient exercise Myoview.    Jeneen Rinks Allanna Bresee  10:07 AM  10/28/2017

## 2017-10-28 NOTE — H&P (Signed)
History and Physical    Melissa Bartlett KPT:[id number] DOB: 1946-06-08 DOA: 10/27/2017  PCP: Donald Prose, MD  Patient coming from: Home  I have personally briefly reviewed patient's old medical records in [place]  Chief Complaint: Chest pain  HPI: Melissa Bartlett is a 71 y.o. female with medical history significant of RA, CKD stage 4, HTN, OSA, PAF.  Patient presents to the ED with c/o intermittent, L sided chest pain since yesterday.  Pain is sharp and burning, radiates to L arm.  Episodic pain that lasts for a few seconds to a minute or two at a time.  Associated nausea, no SOB.  Not exertional.   ED Course: Trop neg, EKG A.Fib at 110.   Review of Systems: As per HPI otherwise 10 point review of systems negative.   Past Medical History:  Diagnosis Date  . CKD (chronic kidney disease), stage IV ([place])   . Diabetes ([place])   . HTN (hypertension)   . OSA (obstructive sleep apnea)    with AHI 11/hr with nocturnal hypoxemia   . PAF (paroxysmal atrial fibrillation) ([place])    s/p DCCV in 8/17  . RA (rheumatoid arthritis) ([place])     Past Surgical History:  Procedure Laterality Date  . ANKLE FUSION Right   . BREAST BIOPSY    . CARDIOVERSION N/A 08/15/2016   Procedure: CARDIOVERSION;  Surgeon: Jerline Pain, MD;  Location: [facility] ENDOSCOPY;  Service: Cardiovascular;  Laterality: N/A;  . CHOLECYSTECTOMY    . GALLBLADDER SURGERY    . HYSTEROTOMY     Patrtial  . HYSTEROTOMY     Complete  . KNEE ARTHROSCOPY       reports that she quit smoking about 18 months ago. She has never used smokeless tobacco. She reports that she does not drink alcohol or use drugs.  Allergies  Allergen Reactions  . Enalapril Other (See Comments)    Systemic reaction - jerking  . Codeine Itching  . Oxycodone Itching  . Vicodin [Hydrocodone-Acetaminophen] Itching    Family History  Problem Relation Age of Onset  . Hypertension Mother        Family HX Diabetes  . Stomach cancer Father       Prior to Admission medications   Medication Sig Start Date End Date Taking? Authorizing Provider  amLODipine (NORVASC) 10 MG tablet TAKE 1 TABLET BY MOUTH EVERY DAY 10/02/17  Yes Jerline Pain, MD  Calcium Carbonate-Vitamin D3 (CALCIUM 600/VITAMIN D) 600-400 MG-UNIT TABS Take 1 tablet by mouth 2 (two) times daily.   Yes [provider]  carvedilol (COREG) 25 MG tablet TAKE 1 TABLET BY MOUTH TWICE A DAY WITH A MEAL 09/03/17  Yes Skains, Thana Farr, MD  COMBIGAN 0.2-0.5 % ophthalmic solution Place 1 drop into both eyes 2 (two) times daily.  01/29/14  Yes [provider]  diphenhydramine-acetaminophen (TYLENOL PM) 25-500 MG TABS tablet Take 1 tablet by mouth at bedtime.    Yes [provider]  fluticasone (FLONASE) 50 MCG/ACT nasal spray Place 1-2 sprays into both nostrils daily as needed for allergies or rhinitis.   Yes [provider]  furosemide (LASIX) 40 MG tablet Take 1 tablet (40 mg total) by mouth daily. 10/15/17  Yes Jerline Pain, MD  KLOR-CON M20 20 MEQ tablet TAKE 1 TABLET BY MOUTH EVERY DAY 10/02/17  Yes Jerline Pain, MD  leflunomide (ARAVA) 20 MG tablet Take 10 mg by mouth daily.    Yes [provider]  losartan-hydrochlorothiazide (  HYZAAR) 100-25 MG tablet TAKE 1 TABLET BY MOUTH EVERY DAY 09/17/17  Yes Jerline Pain, MD  predniSONE (DELTASONE) 5 MG tablet Take 5 mg by mouth daily.  08/22/17  Yes [provider]  Probiotic Product (ALIGN PO) Take 1 tablet by mouth daily.   Yes [provider]  simvastatin (ZOCOR) 20 MG tablet Take 20 mg by mouth every evening. 06/21/16  Yes [provider]  vitamin B-12 (CYANOCOBALAMIN) 1000 MCG tablet Take 1,000 mcg by mouth daily.   Yes [provider]  XARELTO 15 MG TABS tablet TAKE 1 TABLET BY MOUTH EVERY DAY WITH SUPPER 07/27/17  Yes Jerline Pain, MD    Physical Exam: Vitals:   10/27/17 2300 10/27/17 2330 10/28/17 0000 10/28/17 0030  BP: 139/77 (!) 149/77 (!)  146/86 (!) 158/98  Pulse: 87 94 89 97  Resp: 11 19 18 18   Temp:      TempSrc:      SpO2: 95% 91% 91% 92%  Weight:      Height:        Constitutional: NAD, calm, comfortable Eyes: PERRL, lids and conjunctivae normal ENMT: Mucous membranes are moist. Posterior pharynx clear of any exudate or lesions.Normal dentition.  Neck: normal, supple, no masses, no thyromegaly Respiratory: clear to auscultation bilaterally, no wheezing, no crackles. Normal respiratory effort. No accessory muscle use.  Cardiovascular: Irr, irr Abdomen: no tenderness, no masses palpated. No hepatosplenomegaly. Bowel sounds positive.  Musculoskeletal: no clubbing / cyanosis. No joint deformity upper and lower extremities. Good ROM, no contractures. Normal muscle tone.  Skin: no rashes, lesions, ulcers. No induration Neurologic: CN 2-12 grossly intact. Sensation intact, DTR normal. Strength 5/5 in all 4.  Psychiatric: Normal judgment and insight. Alert and oriented x 3. Normal mood.    Labs on Admission: I have personally reviewed following labs and imaging studies  CBC:  Recent Labs Lab 10/27/17 2108  WBC 12.6*  HGB 11.2*  HCT 38.7  MCV 91.3  PLT 161   Basic Metabolic Panel:  Recent Labs Lab 10/27/17 2108  NA 141  K 4.0  CL 106  CO2 26  GLUCOSE 123*  BUN 35*  CREATININE 2.93*  CALCIUM 7.7*   GFR: Estimated Creatinine Clearance: 21.6 mL/min (A) (by C-G formula based on SCr of 2.93 mg/dL (H)). Liver Function Tests: No results for input(s): AST, ALT, ALKPHOS, BILITOT, PROT, ALBUMIN in the last 168 hours. No results for input(s): LIPASE, AMYLASE in the last 168 hours. No results for input(s): AMMONIA in the last 168 hours. Coagulation Profile:  Recent Labs Lab 10/27/17 2108  INR 1.48   Cardiac Enzymes: No results for input(s): CKTOTAL, CKMB, CKMBINDEX, TROPONINI in the last 168 hours. BNP (last 3 results) No results for input(s): PROBNP in the last 8760 hours. HbA1C: No results for  input(s): HGBA1C in the last 72 hours. CBG: No results for input(s): GLUCAP in the last 168 hours. Lipid Profile: No results for input(s): CHOL, HDL, LDLCALC, TRIG, CHOLHDL, LDLDIRECT in the last 72 hours. Thyroid Function Tests: No results for input(s): TSH, T4TOTAL, FREET4, T3FREE, THYROIDAB in the last 72 hours. Anemia Panel: No results for input(s): VITAMINB12, FOLATE, FERRITIN, TIBC, IRON, RETICCTPCT in the last 72 hours. Urine analysis:    Component Value Date/Time   COLORURINE YELLOW 04/28/2010 0304   APPEARANCEUR CLEAR 04/28/2010 0304   LABSPEC 1.016 04/28/2010 0304   PHURINE 5.5 04/28/2010 0304   GLUCOSEU NEGATIVE 04/28/2010 0304   HGBUR NEGATIVE 04/28/2010 0304   BILIRUBINUR NEGATIVE 04/28/2010 0304  KETONESUR NEGATIVE 04/28/2010 0304   PROTEINUR 100 (A) 04/28/2010 0304   UROBILINOGEN 0.2 04/28/2010 0304   NITRITE NEGATIVE 04/28/2010 0304   LEUKOCYTESUR NEGATIVE 04/28/2010 0304    Radiological Exams on Admission: Dg Chest 2 View  Result Date: 10/27/2017 CLINICAL DATA:  71 year old female with chest pain. EXAM: CHEST  2 VIEW COMPARISON:  Chest radiograph dated 10/09/2017 FINDINGS: There has been interval improvement or near complete resolution of the pleural effusions seen on the prior radiograph. Trace effusion may be present in the right major fissure. There is no focal consolidation, or pneumothorax. The cardiac silhouette is within normal limits. No acute osseous pathology. IMPRESSION: Interval resolution of the previously seen pleural effusions. Trace remaining pleural effusion in the right fissure. No focal consolidation. Electronically Signed   By: Anner Crete M.D.   On: 10/27/2017 21:22    EKG: Independently reviewed.  Assessment/Plan Principal Problem:   Chest pain, rule out acute myocardial infarction Active Problems:   Essential hypertension   Paroxysmal atrial fibrillation (HCC)   CKD (chronic kidney disease) stage 4, GFR 15-29 ml/min (HCC)     1. CP - fairly atypical symptoms for ACS 1. CP obs pathway 2. Serial trops 3. Tele monitor 4. Will try GI cocktail to see if this helps 5. Depending on results consider calling cards in AM 2. HTN - continue home meds 3. PAF - continue Xarelto 4. CKD stage 4 - chronic, slight worsening in baseline since being on daily lasix instead of PRN lasix, but seems stable over past month. 5. RA - continue Arava, prednisone  DVT prophylaxis: Xarelto Code Status: Full Family Communication: Husband at bedside Disposition Plan: Home after admit Consults called: None Admission status: Place in [place], [facility] Pager [phone]  If 7AM-7PM, please contact day team taking care of patient [email or web address] Password [email or web address]  10/28/2017, 1:24 AM

## 2017-10-28 NOTE — Plan of Care (Signed)
Problem: Education: Goal: Knowledge of [facility] Education information/materials will improve Outcome: Progressing POC and orders reviewed with pt.   

## 2017-10-28 NOTE — Discharge Summary (Signed)
Physician Discharge Summary  Melissa Bartlett DTO:[id number] DOB: [birth year] DOA: 10/27/2017  PCP: Donald Prose, MD  Admit date: 10/27/2017 Discharge date: 10/28/2017   Recommendations for Outpatient Follow-Up:   Follow BMP Outpatient stress test   Discharge Diagnosis:   Principal Problem:   Chest pain, rule out acute myocardial infarction Active Problems:   Diabetes [facility])   Essential hypertension   Morbid obesity (HCC)   Paroxysmal atrial fibrillation (HCC)   CKD (chronic kidney disease) stage 4, GFR 15-29 ml/min (HCC)   OSA (obstructive sleep apnea)   Discharge disposition:  Home  Discharge Condition: Improved.  Diet recommendation: Low sodium, heart healthy/carb mod  Wound care: None.   History of Present Illness:   Melissa ANASTAS is a 71 y.o. female with medical history significant of RA, CKD stage 4, HTN, OSA, PAF.  Patient presents to the ED with c/o intermittent, L sided chest pain since yesterday.  Pain is sharp and burning, radiates to L arm.  Episodic pain that lasts for a few seconds to a minute or two at a time.  Associated nausea, no SOB.  Not exertional.   Hospital Course by Problem:   Chest pain- suspect GI in origin-- add PPI (on chronic steroid) - troponin x 2 negative - EKG rate controlled Afib with poor R wave progression -outpatient stress test with cards -pain on palpation over her left chest during exam.  Hypertension -Pressures this admission have been in the 983 systolic -Continue home meds of Norvasc, Hyzaar, and Coreg -Continue daily Lasix for lower extremity swelling likely secondary to chronic steroid use for her RA  Permanent atrial fibrillation, rate controlled -Continue Coreg and Xarelto for anticoagulation -She is tolerating the rhythm well and is hemodynamically stable -follow BMP and adjust xarelto based on CR     Medical Consultants:    cards   Discharge Exam:   Vitals:   10/28/17 0532 10/28/17 1155    BP: (!) 146/71 124/61  Pulse: 87 82  Resp: 17 17  Temp: 97.6 F (36.4 C) 97.6 F (36.4 C)  SpO2: 100% 94%   Vitals:   10/28/17 0200 10/28/17 0258 10/28/17 0532 10/28/17 1155  BP: (!) 164/85 (!) 142/84 (!) 146/71 124/61  Pulse: 87 94 87 82  Resp: 19  17 17   Temp:  (!) 97.4 F (36.3 C) 97.6 F (36.4 C) 97.6 F (36.4 C)  TempSrc:  Oral Oral Oral  SpO2: 96% 100% 100% 94%  Weight:  115.9 kg (255 lb 9.6 oz) 115.9 kg (255 lb 9.6 oz)   Height:  5\' 2"  (1.575 m)      Gen:  NAD- anxious to go home    The results of significant diagnostics from this hospitalization (including imaging, microbiology, ancillary and laboratory) are listed below for reference.     Procedures and Diagnostic Studies:   Dg Chest 2 View  Result Date: 10/27/2017 CLINICAL DATA:  71 year old female with chest pain. EXAM: CHEST  2 VIEW COMPARISON:  Chest radiograph dated 10/09/2017 FINDINGS: There has been interval improvement or near complete resolution of the pleural effusions seen on the prior radiograph. Trace effusion may be present in the right major fissure. There is no focal consolidation, or pneumothorax. The cardiac silhouette is within normal limits. No acute osseous pathology. IMPRESSION: Interval resolution of the previously seen pleural effusions. Trace remaining pleural effusion in the right fissure. No focal consolidation. Electronically Signed   By: Anner Crete M.D.   On: 10/27/2017 21:22  Labs:   Basic Metabolic Panel:  Recent Labs Lab 10/27/17 2108  NA 141  K 4.0  CL 106  CO2 26  GLUCOSE 123*  BUN 35*  CREATININE 2.93*  CALCIUM 7.7*   GFR Estimated Creatinine Clearance: 21.2 mL/min (A) (by C-G formula based on SCr of 2.93 mg/dL (H)). Liver Function Tests: No results for input(s): AST, ALT, ALKPHOS, BILITOT, PROT, ALBUMIN in the last 168 hours. No results for input(s): LIPASE, AMYLASE in the last 168 hours. No results for input(s): AMMONIA in the last 168  hours. Coagulation profile  Recent Labs Lab 10/27/17 2108  INR 1.48    CBC:  Recent Labs Lab 10/27/17 2108  WBC 12.6*  HGB 11.2*  HCT 38.7  MCV 91.3  PLT 245   Cardiac Enzymes: No results for input(s): CKTOTAL, CKMB, CKMBINDEX, TROPONINI in the last 168 hours. BNP: Invalid input(s): POCBNP CBG: No results for input(s): GLUCAP in the last 168 hours. D-Dimer No results for input(s): DDIMER in the last 72 hours. Hgb A1c No results for input(s): HGBA1C in the last 72 hours. Lipid Profile No results for input(s): CHOL, HDL, LDLCALC, TRIG, CHOLHDL, LDLDIRECT in the last 72 hours. Thyroid function studies No results for input(s): TSH, T4TOTAL, T3FREE, THYROIDAB in the last 72 hours.  Invalid input(s): FREET3 Anemia work up No results for input(s): VITAMINB12, FOLATE, FERRITIN, TIBC, IRON, RETICCTPCT in the last 72 hours. Microbiology No results found for this or any previous visit (from the past 240 hour(s)).   Discharge Instructions:   Discharge Instructions    Diet - low sodium heart healthy    Complete by:  As directed    Discharge instructions    Complete by:  As directed    Outpatient stress test   Increase activity slowly    Complete by:  As directed      Allergies as of 10/28/2017      Reactions   Enalapril Other (See Comments)   Systemic reaction - jerking   Codeine Itching   Oxycodone Itching   Vicodin [hydrocodone-acetaminophen] Itching      Medication List    TAKE these medications   ALIGN PO Take 1 tablet by mouth daily.   amLODipine 10 MG tablet Commonly known as:  NORVASC TAKE 1 TABLET BY MOUTH EVERY DAY   CALCIUM 600/VITAMIN D 600-400 MG-UNIT Tabs Generic drug:  Calcium Carbonate-Vitamin D3 Take 1 tablet by mouth 2 (two) times daily.   carvedilol 25 MG tablet Commonly known as:  COREG TAKE 1 TABLET BY MOUTH TWICE A DAY WITH A MEAL   COMBIGAN 0.2-0.5 % ophthalmic solution Generic drug:  brimonidine-timolol Place 1 drop into  both eyes 2 (two) times daily.   diphenhydramine-acetaminophen 25-500 MG Tabs tablet Commonly known as:  TYLENOL PM Take 1 tablet by mouth at bedtime.   fluticasone 50 MCG/ACT nasal spray Commonly known as:  FLONASE Place 1-2 sprays into both nostrils daily as needed for allergies or rhinitis.   furosemide 40 MG tablet Commonly known as:  LASIX Take 1 tablet (40 mg total) by mouth daily.   KLOR-CON M20 20 MEQ tablet Generic drug:  potassium chloride SA TAKE 1 TABLET BY MOUTH EVERY DAY   leflunomide 20 MG tablet Commonly known as:  ARAVA Take 10 mg by mouth daily.   losartan-hydrochlorothiazide 100-25 MG tablet Commonly known as:  HYZAAR TAKE 1 TABLET BY MOUTH EVERY DAY   pantoprazole 40 MG tablet Commonly known as:  PROTONIX Take 1 tablet (40 mg total) by mouth  daily.   predniSONE 5 MG tablet Commonly known as:  DELTASONE Take 5 mg by mouth daily.   simvastatin 20 MG tablet Commonly known as:  ZOCOR Take 20 mg by mouth every evening.   vitamin B-12 1000 MCG tablet Commonly known as:  CYANOCOBALAMIN Take 1,000 mcg by mouth daily.   XARELTO 15 MG Tabs tablet Generic drug:  Rivaroxaban TAKE 1 TABLET BY MOUTH EVERY DAY WITH SUPPER      Follow-up Information    Donald Prose, MD Follow up.   Specialty:  Family Medicine Why:  BMP 1 week re: kidney function Contact information: [place] [id number] [phone]        cardiology to set up outpatient stress test Follow up.            Time coordinating discharge: 35 min  Signed:  Anntonette Madewell U Sianna Bartlett   [facility] 10/28/2017, 12:18 PM

## 2017-10-28 NOTE — Progress Notes (Signed)
Pt. transported from ER via stretcher to [facility]- 18; alert and oriented x4, ad walked to the bed. No C/O pain. Reviewed orders and oriented to room and call button.

## 2017-11-02 ENCOUNTER — Ambulatory Visit (HOSPITAL_COMMUNITY): Payer: Medicare Other | Attending: Cardiology

## 2017-11-02 DIAGNOSIS — R079 Chest pain, unspecified: Secondary | ICD-10-CM

## 2017-11-02 MED ORDER — TECHNETIUM TC 99M TETROFOSMIN IV KIT
28.0000 | PACK | Freq: Once | INTRAVENOUS | Status: AC | PRN
  Administered 2017-11-02: 28 via INTRAVENOUS
  Filled 2017-11-02: qty 28

## 2017-11-03 ENCOUNTER — Ambulatory Visit (HOSPITAL_COMMUNITY): Payer: Medicare Other | Attending: Cardiovascular Disease

## 2017-11-03 ENCOUNTER — Ambulatory Visit (HOSPITAL_COMMUNITY): Payer: Medicare Other

## 2017-11-03 DIAGNOSIS — R079 Chest pain, unspecified: Secondary | ICD-10-CM | POA: Diagnosis not present

## 2017-11-03 LAB — MYOCARDIAL PERFUSION IMAGING
CHL CUP NUCLEAR SDS: 0
CHL CUP RESTING HR STRESS: 97 {beats}/min
LHR: 0.38
LVDIAVOL: 72 mL (ref 46–106)
LVSYSVOL: 33 mL
NUC STRESS TID: 1.43
Peak HR: 118 {beats}/min
SRS: 6
SSS: 6

## 2017-11-03 MED ORDER — TECHNETIUM TC 99M TETROFOSMIN IV KIT
31.5000 | PACK | Freq: Once | INTRAVENOUS | Status: AC | PRN
  Administered 2017-11-03: 31.5 via INTRAVENOUS
  Filled 2017-11-03: qty 32

## 2017-11-03 MED ORDER — AMINOPHYLLINE 25 MG/ML IV SOLN
75.0000 mg | Freq: Once | INTRAVENOUS | Status: AC
  Administered 2017-11-03: 75 mg via INTRAVENOUS

## 2017-11-03 MED ORDER — REGADENOSON 0.4 MG/5ML IV SOLN
0.4000 mg | Freq: Once | INTRAVENOUS | Status: AC
  Administered 2017-11-03: 0.4 mg via INTRAVENOUS

## 2017-11-04 DIAGNOSIS — I110 Hypertensive heart disease with heart failure: Secondary | ICD-10-CM | POA: Diagnosis not present

## 2017-11-04 DIAGNOSIS — N184 Chronic kidney disease, stage 4 (severe): Secondary | ICD-10-CM | POA: Diagnosis not present

## 2017-11-04 DIAGNOSIS — E1122 Type 2 diabetes mellitus with diabetic chronic kidney disease: Secondary | ICD-10-CM | POA: Diagnosis not present

## 2017-11-04 DIAGNOSIS — R079 Chest pain, unspecified: Secondary | ICD-10-CM | POA: Diagnosis not present

## 2017-11-04 DIAGNOSIS — K219 Gastro-esophageal reflux disease without esophagitis: Secondary | ICD-10-CM | POA: Diagnosis not present

## 2017-11-04 DIAGNOSIS — I480 Paroxysmal atrial fibrillation: Secondary | ICD-10-CM | POA: Diagnosis not present

## 2017-11-05 DIAGNOSIS — N184 Chronic kidney disease, stage 4 (severe): Secondary | ICD-10-CM | POA: Diagnosis not present

## 2017-11-05 DIAGNOSIS — D631 Anemia in chronic kidney disease: Secondary | ICD-10-CM | POA: Diagnosis not present

## 2017-11-10 ENCOUNTER — Encounter: Payer: Self-pay | Admitting: *Deleted

## 2017-11-10 ENCOUNTER — Encounter: Payer: Self-pay | Admitting: Cardiology

## 2017-11-10 ENCOUNTER — Ambulatory Visit (INDEPENDENT_AMBULATORY_CARE_PROVIDER_SITE_OTHER): Payer: Medicare Other | Admitting: Cardiology

## 2017-11-10 VITALS — BP 130/80 | HR 95 | Ht 62.5 in | Wt 257.2 lb

## 2017-11-10 DIAGNOSIS — R9439 Abnormal result of other cardiovascular function study: Secondary | ICD-10-CM

## 2017-11-10 DIAGNOSIS — I482 Chronic atrial fibrillation: Secondary | ICD-10-CM

## 2017-11-10 DIAGNOSIS — R079 Chest pain, unspecified: Secondary | ICD-10-CM

## 2017-11-10 DIAGNOSIS — I4821 Permanent atrial fibrillation: Secondary | ICD-10-CM

## 2017-11-10 DIAGNOSIS — Z01812 Encounter for preprocedural laboratory examination: Secondary | ICD-10-CM

## 2017-11-10 DIAGNOSIS — I10 Essential (primary) hypertension: Secondary | ICD-10-CM

## 2017-11-10 NOTE — Progress Notes (Signed)
[facility]. [address], [address] [place], [place]  [address] Phone: [phone] Fax:  [phone]  Date:  11/10/2017   ID:  MARAKI MACQUARRIE, DOB 24-Nov-1946, MRN [id number]  PCP:  Donald Prose, MD   History of Present Illness: Melissa Bartlett is a 71 y.o. female with permanent atrial fibrillation, rate control, obstructive sleep apnea, morbid obesity here for follow-up. She was in the hospital in March 2018 with atypical chest pain as well as October 28, 2017. Troponins were normal. Reassuring. Ejection fraction was also normal.  Pain seem to be reproducible with palpation over left chest.  Outpatient nuclear stress test ordered. The stress test is not completely normal. There could be what we call transient ischemic dilatation which sometimes is seen with coronary artery disease. There may be a mild anterior defect as well although attenuation artifact could be present. I think given the recent hospitalization for chest pain, it would make sense to pursue cardiac catheterization. She would need to hold her anticoagulation for 2 days prior to procedure.  She has had in the past difficult to control hypertension on multidrug regimen. She is also seeing nephrology because of creatinine in the 2 range.  Her daughter unfortunately has pseudotumor cerebri and underwent shunt procedure and had difficulty with motor weakness, difficulty speaking.   Cardioversion --felt no change. No bleeding, no chest pain, no syncope.  In the past has been on prednisone taper from Dr. Charlestine Night for rheumatoid arthritis.  She denies any chest pain, syncope, orthopnea, PND. Still struggling with weight gain.    Wt Readings from Last 3 Encounters:  11/10/17 257 lb 3.2 oz (116.7 kg)  11/02/17 254 lb (115.2 kg)  10/28/17 255 lb 9.6 oz (115.9 kg)     Past Medical History:  Diagnosis Date  . CKD (chronic kidney disease), stage IV ([place])   . Diabetes ([place])   . HTN (hypertension)   . OSA (obstructive  sleep apnea)    with AHI 11/hr with nocturnal hypoxemia   . PAF (paroxysmal atrial fibrillation) ([place])    s/p DCCV in 8/17  . RA (rheumatoid arthritis) ([place])     Past Surgical History:  Procedure Laterality Date  . ANKLE FUSION Right   . BREAST BIOPSY    . CHOLECYSTECTOMY    . GALLBLADDER SURGERY    . HYSTEROTOMY     Patrtial  . HYSTEROTOMY     Complete  . KNEE ARTHROSCOPY      Current Outpatient Medications  Medication Sig Dispense Refill  . amLODipine (NORVASC) 10 MG tablet TAKE 1 TABLET BY MOUTH EVERY DAY 90 tablet 1  . Calcium Carbonate-Vitamin D3 (CALCIUM 600/VITAMIN D) 600-400 MG-UNIT TABS Take 1 tablet by mouth 2 (two) times daily.    . carvedilol (COREG) 25 MG tablet TAKE 1 TABLET BY MOUTH TWICE A DAY WITH A MEAL 180 tablet 2  . COMBIGAN 0.2-0.5 % ophthalmic solution Place 1 drop into both eyes 2 (two) times daily.     . diphenhydramine-acetaminophen (TYLENOL PM) 25-500 MG TABS tablet Take 1 tablet by mouth at bedtime.     . fluticasone (FLONASE) 50 MCG/ACT nasal spray Place 1-2 sprays into both nostrils daily as needed for allergies or rhinitis.    . furosemide (LASIX) 40 MG tablet Take 1 tablet (40 mg total) by mouth daily. 30 tablet 8  . KLOR-CON M20 20 MEQ tablet TAKE 1 TABLET BY MOUTH EVERY DAY 90 tablet 1  . leflunomide (ARAVA) 20 MG tablet  Take 10 mg by mouth daily.     . pantoprazole (PROTONIX) 40 MG tablet Take 1 tablet (40 mg total) by mouth daily. 30 tablet 0  . predniSONE (DELTASONE) 5 MG tablet Take 5 mg by mouth daily.     . Probiotic Product (ALIGN PO) Take 1 tablet by mouth daily.    . simvastatin (ZOCOR) 20 MG tablet Take 20 mg by mouth every evening.  2  . vitamin B-12 (CYANOCOBALAMIN) 1000 MCG tablet Take 1,000 mcg by mouth daily.    Alveda Reasons 15 MG TABS tablet TAKE 1 TABLET BY MOUTH EVERY DAY WITH SUPPER 30 tablet 8   No current facility-administered medications for this visit.     Allergies:    Allergies  Allergen Reactions  . Enalapril Other  (See Comments)    Systemic reaction - jerking  . Codeine Itching  . Oxycodone Itching  . Vicodin [Hydrocodone-Acetaminophen] Itching    Social History:  The patient  reports that she quit smoking about 19 months ago. she has never used smokeless tobacco. She reports that she does not drink alcohol or use drugs.   ROS:  Please see the history of present illness.   Unless stated above all others negative  PHYSICAL EXAM: VS:  BP 130/80   Pulse 95   Ht 5' 2.5" (1.588 m)   Wt 257 lb 3.2 oz (116.7 kg)   LMP  (LMP Unknown)   BMI 46.29 kg/m  GEN: Well nourished, well developed, in no acute distress , obese HEENT: normal  Neck: no JVD, carotid bruits, or masses Cardiac: irreg; no murmurs, rubs, or gallops,no edema  Respiratory:  clear to auscultation bilaterally, normal work of breathing GI: soft, nontender, nondistended, + BS MS: no deformity or atrophy  Skin: warm and dry, no rash Neuro:  Alert and Oriented x 3, Strength and sensation are intact Psych: euthymic mood, full affect   EKG: 11/10/17-atrial fibrillation heart rate 95 right axis deviation.  07/08/16 - AFIB 75, NSSTW changes. 07/09/15 - SB with PRWP no changes. 03/07/14 - NSR no other changes.   ASSESSMENT AND PLAN:  1. Chest pain/she has had multiple admissions with chest pain-nuclear stress test did show an abnormal transient ischemic dilatation and anterior wall possible ischemia.  Of course sensitivity and specificity of the study are reduced.  Her creatinine is elevated.  I think with her multiple admissions and ongoing symptoms it makes sense to get a definitive answer.  Risks and benefits have been explained including stroke, heart attack, death, renal impairment, bleeding.  Dye sparing procedure will be utilized.  We will hydrate per protocol prior to cath.  She will need to hold her Xarelto for 3 days given her renal impairment.  Hold furosemide combination.  She understands that dye exposure could lead to renal failure.   Dr. Marval Regal is her nephrologist. 2. AFIB - now permanent, failed prior DC cardioversion. Really no change in symptoms. Discovered on 07/08/16 CHADS-VASc -3 female and HTN, Age (A1c 5.6 on no meds). cardioversion at that time. She is completely asymptomatic at this time. She did not realize that she was in atrial fibrillation. Risk factor includes obesity. She is well rate controlled currently.  3. Chronic anticoagulation-continue Xarelto in the evening. Serum creatinine is 2.1 on 04/08/16. GFR is 27. (15-50) We discussed bleeding risks. 4. Stage IV chronic kidney disease-as above. Avoid NSAIDs. Reviewed nephrology notes.  5. Hypertension- overall doing very well and is quite pleased. Medications reviewed 6. Morbid Obesity- no changes, continue  to encourage weight loss. This is contributing to her lower external edema.  7. Tobacco use-excellent job with tobacco cessation 8. A1c 5.6. Excellent. Doing very well. 9. Obstructive sleep apnea-sleep study November 2017-Dr. Turner, CPAP, working with [facility] to improve mask leak 10. Post cath follow up.  Signed, Candee Furbish, MD [facility]  11/10/2017 11:48 AM

## 2017-11-10 NOTE — Patient Instructions (Signed)
Medication Instructions:  The current medical regimen is effective;  continue present plan and medications.  Labwork: Please have blood work today (BMP,CBC and PT/INR)  Testing/Procedures: Your physician has requested that you have a cardiac catheterization. Cardiac catheterization is used to diagnose and/or treat various heart conditions. Doctors may recommend this procedure for a number of different reasons. The most common reason is to evaluate chest pain. Chest pain can be a symptom of coronary artery disease (CAD), and cardiac catheterization can show whether plaque is narrowing or blocking your heart's arteries. This procedure is also used to evaluate the valves, as well as measure the blood flow and oxygen levels in different parts of your heart. For further information please visit [email or web address]. Please follow instruction sheet, as given.  Follow-Up: Follow up with Dr Marlou Porch or PA/NP after your cardiac cath.  If you need a refill on your cardiac medications before your next appointment, please call your pharmacy.  Thank you for choosing [facility]!!

## 2017-11-10 NOTE — H&P (View-Only) (Signed)
[facility]. [address] [place], [place]  [address] Phone: [phone] Fax:  [phone]  Date:  11/10/2017   ID:  Melissa Bartlett, DOB 04-29-1946, MRN [id number]  PCP:  Donald Prose, MD   History of Present Illness: Melissa Bartlett is a 71 y.o. female with permanent atrial fibrillation, rate control, obstructive sleep apnea, morbid obesity here for follow-up. She was in the hospital in March 2018 with atypical chest pain as well as October 28, 2017. Troponins were normal. Reassuring. Ejection fraction was also normal.  Pain seem to be reproducible with palpation over left chest.  Outpatient nuclear stress test ordered. The stress test is not completely normal. There could be what we call transient ischemic dilatation which sometimes is seen with coronary artery disease. There may be a mild anterior defect as well although attenuation artifact could be present. I think given the recent hospitalization for chest pain, it would make sense to pursue cardiac catheterization. She would need to hold her anticoagulation for 2 days prior to procedure.  She has had in the past difficult to control hypertension on multidrug regimen. She is also seeing nephrology because of creatinine in the 2 range.  Her daughter unfortunately has pseudotumor cerebri and underwent shunt procedure and had difficulty with motor weakness, difficulty speaking.   Cardioversion --felt no change. No bleeding, no chest pain, no syncope.  In the past has been on prednisone taper from Dr. Charlestine Night for rheumatoid arthritis.  She denies any chest pain, syncope, orthopnea, PND. Still struggling with weight gain.    Wt Readings from Last 3 Encounters:  11/10/17 257 lb 3.2 oz (116.7 kg)  11/02/17 254 lb (115.2 kg)  10/28/17 255 lb 9.6 oz (115.9 kg)     Past Medical History:  Diagnosis Date  . CKD (chronic kidney disease), stage IV ([place])   . Diabetes ([place])   . HTN (hypertension)   . OSA (obstructive  sleep apnea)    with AHI 11/hr with nocturnal hypoxemia   . PAF (paroxysmal atrial fibrillation) ([place])    s/p DCCV in 8/17  . RA (rheumatoid arthritis) ([place])     Past Surgical History:  Procedure Laterality Date  . ANKLE FUSION Right   . BREAST BIOPSY    . CHOLECYSTECTOMY    . GALLBLADDER SURGERY    . HYSTEROTOMY     Patrtial  . HYSTEROTOMY     Complete  . KNEE ARTHROSCOPY      Current Outpatient Medications  Medication Sig Dispense Refill  . amLODipine (NORVASC) 10 MG tablet TAKE 1 TABLET BY MOUTH EVERY DAY 90 tablet 1  . Calcium Carbonate-Vitamin D3 (CALCIUM 600/VITAMIN D) 600-400 MG-UNIT TABS Take 1 tablet by mouth 2 (two) times daily.    . carvedilol (COREG) 25 MG tablet TAKE 1 TABLET BY MOUTH TWICE A DAY WITH A MEAL 180 tablet 2  . COMBIGAN 0.2-0.5 % ophthalmic solution Place 1 drop into both eyes 2 (two) times daily.     . diphenhydramine-acetaminophen (TYLENOL PM) 25-500 MG TABS tablet Take 1 tablet by mouth at bedtime.     . fluticasone (FLONASE) 50 MCG/ACT nasal spray Place 1-2 sprays into both nostrils daily as needed for allergies or rhinitis.    . furosemide (LASIX) 40 MG tablet Take 1 tablet (40 mg total) by mouth daily. 30 tablet 8  . KLOR-CON M20 20 MEQ tablet TAKE 1 TABLET BY MOUTH EVERY DAY 90 tablet 1  . leflunomide (ARAVA) 20 MG tablet  Take 10 mg by mouth daily.     . pantoprazole (PROTONIX) 40 MG tablet Take 1 tablet (40 mg total) by mouth daily. 30 tablet 0  . predniSONE (DELTASONE) 5 MG tablet Take 5 mg by mouth daily.     . Probiotic Product (ALIGN PO) Take 1 tablet by mouth daily.    . simvastatin (ZOCOR) 20 MG tablet Take 20 mg by mouth every evening.  2  . vitamin B-12 (CYANOCOBALAMIN) 1000 MCG tablet Take 1,000 mcg by mouth daily.    Alveda Reasons 15 MG TABS tablet TAKE 1 TABLET BY MOUTH EVERY DAY WITH SUPPER 30 tablet 8   No current facility-administered medications for this visit.     Allergies:    Allergies  Allergen Reactions  . Enalapril Other  (See Comments)    Systemic reaction - jerking  . Codeine Itching  . Oxycodone Itching  . Vicodin [Hydrocodone-Acetaminophen] Itching    Social History:  The patient  reports that she quit smoking about 19 months ago. she has never used smokeless tobacco. She reports that she does not drink alcohol or use drugs.   ROS:  Please see the history of present illness.   Unless stated above all others negative  PHYSICAL EXAM: VS:  BP 130/80   Pulse 95   Ht 5' 2.5" (1.588 m)   Wt 257 lb 3.2 oz (116.7 kg)   LMP  (LMP Unknown)   BMI 46.29 kg/m  GEN: Well nourished, well developed, in no acute distress , obese HEENT: normal  Neck: no JVD, carotid bruits, or masses Cardiac: irreg; no murmurs, rubs, or gallops,no edema  Respiratory:  clear to auscultation bilaterally, normal work of breathing GI: soft, nontender, nondistended, + BS MS: no deformity or atrophy  Skin: warm and dry, no rash Neuro:  Alert and Oriented x 3, Strength and sensation are intact Psych: euthymic mood, full affect   EKG: 11/10/17-atrial fibrillation heart rate 95 right axis deviation.  07/08/16 - AFIB 75, NSSTW changes. 07/09/15 - SB with PRWP no changes. 03/07/14 - NSR no other changes.   ASSESSMENT AND PLAN:  1. Chest pain/she has had multiple admissions with chest pain-nuclear stress test did show an abnormal transient ischemic dilatation and anterior wall possible ischemia.  Of course sensitivity and specificity of the study are reduced.  Her creatinine is elevated.  I think with her multiple admissions and ongoing symptoms it makes sense to get a definitive answer.  Risks and benefits have been explained including stroke, heart attack, death, renal impairment, bleeding.  Dye sparing procedure will be utilized.  We will hydrate per protocol prior to cath.  She will need to hold her Xarelto for 3 days given her renal impairment.  Hold furosemide combination.  She understands that dye exposure could lead to renal failure.   Dr. Marval Regal is her nephrologist. 2. AFIB - now permanent, failed prior DC cardioversion. Really no change in symptoms. Discovered on 07/08/16 CHADS-VASc -3 female and HTN, Age (A1c 5.6 on no meds). cardioversion at that time. She is completely asymptomatic at this time. She did not realize that she was in atrial fibrillation. Risk factor includes obesity. She is well rate controlled currently.  3. Chronic anticoagulation-continue Xarelto in the evening. Serum creatinine is 2.1 on 04/08/16. GFR is 27. (15-50) We discussed bleeding risks. 4. Stage IV chronic kidney disease-as above. Avoid NSAIDs. Reviewed nephrology notes.  5. Hypertension- overall doing very well and is quite pleased. Medications reviewed 6. Morbid Obesity- no changes, continue  to encourage weight loss. This is contributing to her lower external edema.  7. Tobacco use-excellent job with tobacco cessation 8. A1c 5.6. Excellent. Doing very well. 9. Obstructive sleep apnea-sleep study November 2017-Dr. Turner, CPAP, working with [facility] to improve mask leak 10. Post cath follow up.  Signed, Candee Furbish, MD [facility]  11/10/2017 11:48 AM

## 2017-11-11 LAB — CBC
HEMATOCRIT: 35.8 % (ref 34.0–46.6)
HEMOGLOBIN: 10.6 g/dL — AB (ref 11.1–15.9)
MCH: 26.8 pg (ref 26.6–33.0)
MCHC: 29.6 g/dL — ABNORMAL LOW (ref 31.5–35.7)
MCV: 90 fL (ref 79–97)
Platelets: 235 10*3/uL (ref 150–379)
RBC: 3.96 x10E6/uL (ref 3.77–5.28)
RDW: 16.5 % — AB (ref 12.3–15.4)
WBC: 12.1 10*3/uL — AB (ref 3.4–10.8)

## 2017-11-11 LAB — BASIC METABOLIC PANEL
BUN / CREAT RATIO: 10 — AB (ref 12–28)
BUN: 25 mg/dL (ref 8–27)
CO2: 22 mmol/L (ref 20–29)
CREATININE: 2.52 mg/dL — AB (ref 0.57–1.00)
Calcium: 8.6 mg/dL — ABNORMAL LOW (ref 8.7–10.3)
Chloride: 104 mmol/L (ref 96–106)
GFR calc Af Amer: 21 mL/min/{1.73_m2} — ABNORMAL LOW (ref >59)
GFR calc non Af Amer: 19 mL/min/{1.73_m2} — ABNORMAL LOW (ref >59)
GLUCOSE: 138 mg/dL — AB (ref 65–99)
Potassium: 4.6 mmol/L (ref 3.5–5.2)
Sodium: 145 mmol/L — ABNORMAL HIGH (ref 134–144)

## 2017-11-11 LAB — PROTIME-INR
INR: 1.2 (ref 0.8–1.2)
PROTHROMBIN TIME: 12.7 s — AB (ref 9.1–12.0)

## 2017-11-12 ENCOUNTER — Telehealth: Payer: Self-pay

## 2017-11-12 NOTE — Telephone Encounter (Signed)
Patient contacted pre-catheterization at [facility] scheduled for:  11/16/2017 @ 0735 Verified arrival time and place:  [facility] @ 0530 Confirmed AM meds to be taken pre-cath with sip of water: Take ASA Hold furosemide and Hyzaar for 2 days prior Hold Xarelto x 3 days prior Confirmed patient has responsible person to drive home post procedure and observe patient for 24 hours:  yes Addl concerns:  none

## 2017-11-16 ENCOUNTER — Ambulatory Visit (HOSPITAL_COMMUNITY)
Admission: RE | Admit: 2017-11-16 | Discharge: 2017-11-16 | Disposition: A | Discharge status: Home / Self Care | Payer: Medicare Other | Source: Ambulatory Visit | Attending: Nephrology | Admitting: Nephrology

## 2017-11-16 ENCOUNTER — Encounter (HOSPITAL_COMMUNITY)
Admission: RE | Disposition: A | Discharge status: Home / Self Care | Payer: Self-pay | Source: Ambulatory Visit | Attending: Cardiology

## 2017-11-16 ENCOUNTER — Ambulatory Visit (HOSPITAL_COMMUNITY)
Admission: RE | Admit: 2017-11-16 | Discharge: 2017-11-16 | Disposition: A | Discharge status: Home / Self Care | Payer: Medicare Other | Source: Ambulatory Visit | Attending: Cardiology | Admitting: Cardiology

## 2017-11-16 VITALS — BP 139/90 | HR 101 | Resp 16

## 2017-11-16 DIAGNOSIS — E1122 Type 2 diabetes mellitus with diabetic chronic kidney disease: Secondary | ICD-10-CM | POA: Insufficient documentation

## 2017-11-16 DIAGNOSIS — M069 Rheumatoid arthritis, unspecified: Secondary | ICD-10-CM | POA: Diagnosis not present

## 2017-11-16 DIAGNOSIS — Z7901 Long term (current) use of anticoagulants: Secondary | ICD-10-CM | POA: Diagnosis not present

## 2017-11-16 DIAGNOSIS — G4733 Obstructive sleep apnea (adult) (pediatric): Secondary | ICD-10-CM | POA: Diagnosis not present

## 2017-11-16 DIAGNOSIS — N184 Chronic kidney disease, stage 4 (severe): Secondary | ICD-10-CM | POA: Diagnosis not present

## 2017-11-16 DIAGNOSIS — Z6841 Body Mass Index (BMI) 40.0 and over, adult: Secondary | ICD-10-CM | POA: Insufficient documentation

## 2017-11-16 DIAGNOSIS — R0789 Other chest pain: Secondary | ICD-10-CM | POA: Insufficient documentation

## 2017-11-16 DIAGNOSIS — I480 Paroxysmal atrial fibrillation: Secondary | ICD-10-CM | POA: Diagnosis present

## 2017-11-16 DIAGNOSIS — E1129 Type 2 diabetes mellitus with other diabetic kidney complication: Secondary | ICD-10-CM

## 2017-11-16 DIAGNOSIS — I482 Chronic atrial fibrillation: Secondary | ICD-10-CM | POA: Diagnosis not present

## 2017-11-16 DIAGNOSIS — R079 Chest pain, unspecified: Secondary | ICD-10-CM | POA: Diagnosis present

## 2017-11-16 DIAGNOSIS — I10 Essential (primary) hypertension: Secondary | ICD-10-CM | POA: Diagnosis present

## 2017-11-16 DIAGNOSIS — Z7952 Long term (current) use of systemic steroids: Secondary | ICD-10-CM | POA: Insufficient documentation

## 2017-11-16 DIAGNOSIS — I129 Hypertensive chronic kidney disease with stage 1 through stage 4 chronic kidney disease, or unspecified chronic kidney disease: Secondary | ICD-10-CM | POA: Insufficient documentation

## 2017-11-16 DIAGNOSIS — Z87891 Personal history of nicotine dependence: Secondary | ICD-10-CM | POA: Diagnosis not present

## 2017-11-16 DIAGNOSIS — Z7951 Long term (current) use of inhaled steroids: Secondary | ICD-10-CM | POA: Diagnosis not present

## 2017-11-16 HISTORY — PX: LEFT HEART CATH AND CORONARY ANGIOGRAPHY: CATH118249

## 2017-11-16 LAB — BASIC METABOLIC PANEL
ANION GAP: 6 (ref 5–15)
BUN: 27 mg/dL — ABNORMAL HIGH (ref 6–20)
CALCIUM: 7.5 mg/dL — AB (ref 8.9–10.3)
CHLORIDE: 108 mmol/L (ref 101–111)
CO2: 27 mmol/L (ref 22–32)
CREATININE: 2.28 mg/dL — AB (ref 0.44–1.00)
GFR calc Af Amer: 24 mL/min — ABNORMAL LOW (ref >60)
GFR calc non Af Amer: 20 mL/min — ABNORMAL LOW (ref >60)
GLUCOSE: 126 mg/dL — AB (ref 65–99)
Potassium: 4.2 mmol/L (ref 3.5–5.1)
Sodium: 141 mmol/L (ref 135–145)

## 2017-11-16 LAB — GLUCOSE, CAPILLARY: Glucose-Capillary: 92 mg/dL (ref 65–99)

## 2017-11-16 LAB — IRON AND TIBC
Iron: 42 ug/dL (ref 28–170)
Saturation Ratios: 17 % (ref 10.4–31.8)
TIBC: 249 ug/dL — AB (ref 250–450)
UIBC: 207 ug/dL

## 2017-11-16 LAB — POCT HEMOGLOBIN-HEMACUE: HEMOGLOBIN: 10.7 g/dL — AB (ref 12.0–15.0)

## 2017-11-16 LAB — FERRITIN: Ferritin: 179 ng/mL (ref 11–307)

## 2017-11-16 LAB — PHOSPHORUS: Phosphorus: 3.7 mg/dL (ref 2.5–4.6)

## 2017-11-16 SURGERY — LEFT HEART CATH AND CORONARY ANGIOGRAPHY
Anesthesia: LOCAL

## 2017-11-16 MED ORDER — RIVAROXABAN 15 MG PO TABS: 15.0000 mg | ORAL_TABLET | Freq: Every day | ORAL | 8 refills | Status: DC

## 2017-11-16 MED ORDER — ASPIRIN 81 MG PO CHEW: 81.0000 mg | CHEWABLE_TABLET | ORAL | Status: DC

## 2017-11-16 MED ORDER — SODIUM CHLORIDE 0.9% FLUSH: 3.0000 mL | INTRAVENOUS | Status: DC | PRN

## 2017-11-16 MED ORDER — FENTANYL CITRATE (PF) 100 MCG/2ML IJ SOLN
INTRAMUSCULAR | Status: DC | PRN
  Administered 2017-11-16: 25 ug via INTRAVENOUS

## 2017-11-16 MED ORDER — MIDAZOLAM HCL 2 MG/2ML IJ SOLN
INTRAMUSCULAR | Status: AC
  Filled 2017-11-16: qty 2

## 2017-11-16 MED ORDER — VERAPAMIL HCL 2.5 MG/ML IV SOLN
INTRAVENOUS | Status: DC | PRN
  Administered 2017-11-16: 10 mL via INTRA_ARTERIAL

## 2017-11-16 MED ORDER — SODIUM CHLORIDE 0.9 % IV SOLN: 250.0000 mL | INTRAVENOUS | Status: DC | PRN

## 2017-11-16 MED ORDER — MIDAZOLAM HCL 2 MG/2ML IJ SOLN
INTRAMUSCULAR | Status: DC | PRN
  Administered 2017-11-16: 1 mg via INTRAVENOUS

## 2017-11-16 MED ORDER — SODIUM CHLORIDE 0.9% FLUSH: 3.0000 mL | Freq: Two times a day (BID) | INTRAVENOUS | Status: DC

## 2017-11-16 MED ORDER — DARBEPOETIN ALFA 60 MCG/0.3ML IJ SOSY
PREFILLED_SYRINGE | INTRAMUSCULAR | Status: AC
  Administered 2017-11-16: 60 ug
  Filled 2017-11-16: qty 0.3

## 2017-11-16 MED ORDER — HEPARIN (PORCINE) IN NACL 2-0.9 UNIT/ML-% IJ SOLN
INTRAMUSCULAR | Status: AC
  Filled 2017-11-16: qty 1000

## 2017-11-16 MED ORDER — SODIUM CHLORIDE 0.9 % WEIGHT BASED INFUSION
1.0000 mL/kg/h | INTRAVENOUS | Status: AC
  Administered 2017-11-16: 1 mL/kg/h via INTRAVENOUS

## 2017-11-16 MED ORDER — HEPARIN (PORCINE) IN NACL 2-0.9 UNIT/ML-% IJ SOLN
INTRAMUSCULAR | Status: AC | PRN
  Administered 2017-11-16: 1000 mL

## 2017-11-16 MED ORDER — LIDOCAINE HCL (PF) 1 % IJ SOLN
INTRAMUSCULAR | Status: DC | PRN
  Administered 2017-11-16: 2 mL via SUBCUTANEOUS

## 2017-11-16 MED ORDER — HEPARIN SODIUM (PORCINE) 1000 UNIT/ML IJ SOLN
INTRAMUSCULAR | Status: AC
  Filled 2017-11-16: qty 1

## 2017-11-16 MED ORDER — LIDOCAINE HCL (PF) 1 % IJ SOLN
INTRAMUSCULAR | Status: AC
  Filled 2017-11-16: qty 30

## 2017-11-16 MED ORDER — SODIUM CHLORIDE 0.9 % WEIGHT BASED INFUSION
3.0000 mL/kg/h | INTRAVENOUS | Status: AC
  Administered 2017-11-16: 3 mL/kg/h via INTRAVENOUS

## 2017-11-16 MED ORDER — VERAPAMIL HCL 2.5 MG/ML IV SOLN
INTRAVENOUS | Status: AC
  Filled 2017-11-16: qty 2

## 2017-11-16 MED ORDER — IOPAMIDOL (ISOVUE-370) INJECTION 76%
INTRAVENOUS | Status: AC
  Filled 2017-11-16: qty 100

## 2017-11-16 MED ORDER — IOPAMIDOL (ISOVUE-370) INJECTION 76%
INTRAVENOUS | Status: DC | PRN
Start: 2017-11-16 — End: 2017-11-16
  Administered 2017-11-16: 50 mL via INTRA_ARTERIAL

## 2017-11-16 MED ORDER — DARBEPOETIN ALFA 60 MCG/0.3ML IJ SOSY: 60.0000 ug | PREFILLED_SYRINGE | INTRAMUSCULAR | Status: DC

## 2017-11-16 MED ORDER — SODIUM CHLORIDE 0.9 % WEIGHT BASED INFUSION: 1.0000 mL/kg/h | INTRAVENOUS | Status: DC

## 2017-11-16 MED ORDER — FENTANYL CITRATE (PF) 100 MCG/2ML IJ SOLN
INTRAMUSCULAR | Status: AC
  Filled 2017-11-16: qty 2

## 2017-11-16 MED ORDER — HEPARIN SODIUM (PORCINE) 1000 UNIT/ML IJ SOLN
INTRAMUSCULAR | Status: DC | PRN
  Administered 2017-11-16: 5000 [IU] via INTRAVENOUS

## 2017-11-16 SURGICAL SUPPLY — 12 items
CATH 5FR JL3.5 JR4 ANG PIG MP (CATHETERS) ×1 IMPLANT
COVER PRB 48X5XTLSCP FOLD TPE (BAG) IMPLANT
COVER PROBE 5X48 (BAG) ×2
DEVICE RAD COMP TR BAND LRG (VASCULAR PRODUCTS) ×1 IMPLANT
GLIDESHEATH SLEND SS 6F .021 (SHEATH) ×1 IMPLANT
GUIDEWIRE INQWIRE 1.5J.035X260 (WIRE) IMPLANT
INQWIRE 1.5J .035X260CM (WIRE) ×2
KIT HEART LEFT (KITS) ×2 IMPLANT
PACK CARDIAC CATHETERIZATION (CUSTOM PROCEDURE TRAY) ×2 IMPLANT
TRANSDUCER W/STOPCOCK (MISCELLANEOUS) ×2 IMPLANT
TUBING CIL FLEX 10 FLL-RA (TUBING) ×2 IMPLANT
WIRE HI TORQ VERSACORE-J 145CM (WIRE) ×1 IMPLANT

## 2017-11-16 NOTE — Discharge Instructions (Addendum)
Resume Xarelto tomorrow 11/17/17     Radial Site Care Refer to this sheet in the next few weeks. These instructions provide you with information about caring for yourself after your procedure. Your health care provider may also give you more specific instructions. Your treatment has been planned according to current medical practices, but problems sometimes occur. Call your health care provider if you have any problems or questions after your procedure. What can I expect after the procedure? After your procedure, it is typical to have the following:  Bruising at the radial site that usually fades within 1-2 weeks.  Blood collecting in the tissue (hematoma) that may be painful to the touch. It should usually decrease in size and tenderness within 1-2 weeks.  Follow these instructions at home:  Take medicines only as directed by your health care provider.  You may shower 24-48 hours after the procedure or as directed by your health care provider. Remove the bandage (dressing) and gently wash the site with plain soap and water. Pat the area dry with a clean towel. Do not rub the site, because this may cause bleeding.  Do not take baths, swim, or use a hot tub until your health care provider approves.  Check your insertion site every day for redness, swelling, or drainage.  Do not apply powder or lotion to the site.  Do not flex or bend the affected arm for 24 hours or as directed by your health care provider.  Do not push or pull heavy objects with the affected arm for 24 hours or as directed by your health care provider.  Do not lift over 10 lb (4.5 kg) for 5 days after your procedure or as directed by your health care provider.  Ask your health care provider when it is okay to: ? Return to work or school. ? Resume usual physical activities or sports. ? Resume sexual activity.  Do not drive home if you are discharged the same day as the procedure. Have someone else drive  you.  You may drive 24 hours after the procedure unless otherwise instructed by your health care provider.  Do not operate machinery or power tools for 24 hours after the procedure.  If your procedure was done as an outpatient procedure, which means that you went home the same day as your procedure, a responsible adult should be with you for the first 24 hours after you arrive home.  Keep all follow-up visits as directed by your health care provider. This is important. Contact a health care provider if:  You have a fever.  You have chills.  You have increased bleeding from the radial site. Hold pressure on the site. Get help right away if:  You have unusual pain at the radial site.  You have redness, warmth, or swelling at the radial site.  You have drainage (other than a small amount of blood on the dressing) from the radial site.  The radial site is bleeding, and the bleeding does not stop after 30 minutes of holding steady pressure on the site.  Your arm or hand becomes pale, cool, tingly, or numb. This information is not intended to replace advice given to you by your health care provider. Make sure you discuss any questions you have with your health care provider. Document Released: 01/17/2011 Document Revised: 05/22/2016 Document Reviewed: 07/03/2014 Elsevier Interactive Patient Education  2018 [employer].

## 2017-11-16 NOTE — Interval H&P Note (Signed)
History and Physical Interval Note:  11/16/2017 11:37 AM  Melissa Bartlett  has presented today for surgery, with the diagnosis of abnormal stress test - cp  The various methods of treatment have been discussed with the patient and family. After consideration of risks, benefits and other options for treatment, the patient has consented to  Procedure(s): LEFT HEART CATH AND CORONARY ANGIOGRAPHY (N/A) as a surgical intervention .  The patient's history has been reviewed, patient examined, no change in status, stable for surgery.  I have reviewed the patient's chart and labs.  Questions were answered to the patient's satisfaction.     Collier Salina [facility] 11/16/2017 11:38 AM

## 2017-11-17 ENCOUNTER — Encounter (HOSPITAL_COMMUNITY): Payer: Medicare Other

## 2017-11-17 ENCOUNTER — Encounter (HOSPITAL_COMMUNITY): Payer: Self-pay | Admitting: Cardiology

## 2017-11-30 DIAGNOSIS — M150 Primary generalized (osteo)arthritis: Secondary | ICD-10-CM | POA: Diagnosis not present

## 2017-11-30 DIAGNOSIS — M2550 Pain in unspecified joint: Secondary | ICD-10-CM | POA: Diagnosis not present

## 2017-11-30 DIAGNOSIS — E790 Hyperuricemia without signs of inflammatory arthritis and tophaceous disease: Secondary | ICD-10-CM | POA: Diagnosis not present

## 2017-11-30 DIAGNOSIS — Z6841 Body Mass Index (BMI) 40.0 and over, adult: Secondary | ICD-10-CM | POA: Diagnosis not present

## 2017-11-30 DIAGNOSIS — Z79899 Other long term (current) drug therapy: Secondary | ICD-10-CM | POA: Diagnosis not present

## 2017-11-30 DIAGNOSIS — M0579 Rheumatoid arthritis with rheumatoid factor of multiple sites without organ or systems involvement: Secondary | ICD-10-CM | POA: Diagnosis not present

## 2017-12-14 ENCOUNTER — Encounter (HOSPITAL_COMMUNITY)
Admission: RE | Admit: 2017-12-14 | Discharge: 2017-12-14 | Disposition: A | Discharge status: Home / Self Care | Payer: Medicare Other | Source: Ambulatory Visit | Attending: Nephrology | Admitting: Nephrology

## 2017-12-14 VITALS — BP 137/75 | HR 88 | Temp 98.0°F | Resp 20

## 2017-12-14 DIAGNOSIS — D631 Anemia in chronic kidney disease: Secondary | ICD-10-CM | POA: Diagnosis not present

## 2017-12-14 DIAGNOSIS — N184 Chronic kidney disease, stage 4 (severe): Secondary | ICD-10-CM

## 2017-12-14 LAB — COMPREHENSIVE METABOLIC PANEL
ALK PHOS: 69 U/L (ref 38–126)
ALT: 11 U/L — AB (ref 14–54)
AST: 16 U/L (ref 15–41)
Albumin: 2.8 g/dL — ABNORMAL LOW (ref 3.5–5.0)
Anion gap: 9 (ref 5–15)
BUN: 25 mg/dL — ABNORMAL HIGH (ref 6–20)
CALCIUM: 7.5 mg/dL — AB (ref 8.9–10.3)
CO2: 25 mmol/L (ref 22–32)
CREATININE: 2.45 mg/dL — AB (ref 0.44–1.00)
Chloride: 107 mmol/L (ref 101–111)
GFR, EST AFRICAN AMERICAN: 22 mL/min — AB (ref >60)
GFR, EST NON AFRICAN AMERICAN: 19 mL/min — AB (ref >60)
Glucose, Bld: 120 mg/dL — ABNORMAL HIGH (ref 65–99)
Potassium: 3.6 mmol/L (ref 3.5–5.1)
SODIUM: 141 mmol/L (ref 135–145)
Total Bilirubin: 0.6 mg/dL (ref 0.3–1.2)
Total Protein: 5.9 g/dL — ABNORMAL LOW (ref 6.5–8.1)

## 2017-12-14 LAB — CBC
HCT: 37 % (ref 36.0–46.0)
HEMOGLOBIN: 10.9 g/dL — AB (ref 12.0–15.0)
MCH: 26 pg (ref 26.0–34.0)
MCHC: 29.5 g/dL — ABNORMAL LOW (ref 30.0–36.0)
MCV: 88.1 fL (ref 78.0–100.0)
Platelets: 224 10*3/uL (ref 150–400)
RBC: 4.2 MIL/uL (ref 3.87–5.11)
RDW: 16.9 % — ABNORMAL HIGH (ref 11.5–15.5)
WBC: 11.3 10*3/uL — ABNORMAL HIGH (ref 4.0–10.5)

## 2017-12-14 MED ORDER — DARBEPOETIN ALFA 60 MCG/0.3ML IJ SOSY
60.0000 ug | PREFILLED_SYRINGE | INTRAMUSCULAR | Status: DC
  Administered 2017-12-14: 60 ug via SUBCUTANEOUS

## 2017-12-14 MED ORDER — DARBEPOETIN ALFA 60 MCG/0.3ML IJ SOSY
PREFILLED_SYRINGE | INTRAMUSCULAR | Status: AC
  Filled 2017-12-14: qty 0.3

## 2018-01-11 ENCOUNTER — Encounter (HOSPITAL_COMMUNITY): Payer: Medicare Other

## 2018-01-13 ENCOUNTER — Encounter (HOSPITAL_COMMUNITY)
Admission: RE | Admit: 2018-01-13 | Discharge: 2018-01-13 | Disposition: A | Discharge status: Home / Self Care | Payer: Medicare Other | Source: Ambulatory Visit | Attending: Nephrology | Admitting: Nephrology

## 2018-01-13 VITALS — BP 154/74 | HR 87 | Temp 98.0°F | Resp 20

## 2018-01-13 DIAGNOSIS — D631 Anemia in chronic kidney disease: Secondary | ICD-10-CM | POA: Insufficient documentation

## 2018-01-13 DIAGNOSIS — N184 Chronic kidney disease, stage 4 (severe): Secondary | ICD-10-CM

## 2018-01-13 LAB — IRON AND TIBC
IRON: 56 ug/dL (ref 28–170)
Saturation Ratios: 19 % (ref 10.4–31.8)
TIBC: 297 ug/dL (ref 250–450)
UIBC: 241 ug/dL

## 2018-01-13 LAB — CBC
HEMATOCRIT: 35.9 % — AB (ref 36.0–46.0)
HEMOGLOBIN: 10.6 g/dL — AB (ref 12.0–15.0)
MCH: 26.4 pg (ref 26.0–34.0)
MCHC: 29.5 g/dL — ABNORMAL LOW (ref 30.0–36.0)
MCV: 89.3 fL (ref 78.0–100.0)
Platelets: 203 10*3/uL (ref 150–400)
RBC: 4.02 MIL/uL (ref 3.87–5.11)
RDW: 18 % — ABNORMAL HIGH (ref 11.5–15.5)
WBC: 10.9 10*3/uL — AB (ref 4.0–10.5)

## 2018-01-13 LAB — PHOSPHORUS: Phosphorus: 3.5 mg/dL (ref 2.5–4.6)

## 2018-01-13 LAB — FERRITIN: Ferritin: 157 ng/mL (ref 11–307)

## 2018-01-13 MED ORDER — DARBEPOETIN ALFA 60 MCG/0.3ML IJ SOSY
PREFILLED_SYRINGE | INTRAMUSCULAR | Status: AC
  Filled 2018-01-13: qty 0.3

## 2018-01-13 MED ORDER — DARBEPOETIN ALFA 60 MCG/0.3ML IJ SOSY
60.0000 ug | PREFILLED_SYRINGE | INTRAMUSCULAR | Status: DC
  Administered 2018-01-13: 60 ug via SUBCUTANEOUS

## 2018-01-14 ENCOUNTER — Encounter: Payer: Self-pay | Admitting: Cardiology

## 2018-01-14 ENCOUNTER — Ambulatory Visit (INDEPENDENT_AMBULATORY_CARE_PROVIDER_SITE_OTHER): Payer: Medicare Other | Admitting: Cardiology

## 2018-01-14 VITALS — BP 148/90 | HR 96 | Ht 62.5 in | Wt 265.0 lb

## 2018-01-14 DIAGNOSIS — R600 Localized edema: Secondary | ICD-10-CM | POA: Diagnosis not present

## 2018-01-14 DIAGNOSIS — N184 Chronic kidney disease, stage 4 (severe): Secondary | ICD-10-CM

## 2018-01-14 DIAGNOSIS — I4821 Permanent atrial fibrillation: Secondary | ICD-10-CM

## 2018-01-14 DIAGNOSIS — I10 Essential (primary) hypertension: Secondary | ICD-10-CM

## 2018-01-14 DIAGNOSIS — I482 Chronic atrial fibrillation: Secondary | ICD-10-CM

## 2018-01-14 LAB — BASIC METABOLIC PANEL
BUN / CREAT RATIO: 10 — AB (ref 12–28)
BUN: 27 mg/dL (ref 8–27)
CHLORIDE: 109 mmol/L — AB (ref 96–106)
CO2: 20 mmol/L (ref 20–29)
Calcium: 7.7 mg/dL — ABNORMAL LOW (ref 8.7–10.3)
Creatinine, Ser: 2.72 mg/dL — ABNORMAL HIGH (ref 0.57–1.00)
GFR calc Af Amer: 20 mL/min/{1.73_m2} — ABNORMAL LOW (ref >59)
GFR calc non Af Amer: 17 mL/min/{1.73_m2} — ABNORMAL LOW (ref >59)
GLUCOSE: 113 mg/dL — AB (ref 65–99)
Potassium: 4.8 mmol/L (ref 3.5–5.2)
SODIUM: 147 mmol/L — AB (ref 134–144)

## 2018-01-14 NOTE — Progress Notes (Signed)
Cardiology Office Note   Date:  01/14/2018   ID:  Melissa Bartlett, DOB [birth year], MRN [id number]  PCP:  Donald Prose, MD  Cardiologist:  Dr. Marlou Porch    Chief Complaint  Patient presents with  . Leg Swelling      History of Present Illness: Melissa Bartlett is a 72 y.o. female who presents for post cath follow up  She has a hx of difficult to control HTN, DM, OSA, PAF on 07/08/16 s/p DCCV 07/2016, now permanent Afib (on xarelto), CKD stage IV, RA cha2DS2VASc is 4.   In Oct admitted for chest pain that was reproducible.  Neg troponins. --stress test was done and not normal, possible ant defect so underwent cath and found to have normal coronary angiography and elevated LVEDP. 11/16/17  Today she is here post cath but she has significant edema.  She can hardly walk due to the edema of both legs. No SOB just swelling.  She is to see renal at the end of the month.  No chest pain.  She has not been taking her lasix daily.  She does monitor her salt intake.  Her labs post cath no change in Cr.    Past Medical History:  Diagnosis Date  . CKD (chronic kidney disease), stage IV ([place])   . Diabetes ([place])   . HTN (hypertension)   . OSA (obstructive sleep apnea)    with AHI 11/hr with nocturnal hypoxemia   . PAF (paroxysmal atrial fibrillation) ([place])    s/p DCCV in 8/17  . RA (rheumatoid arthritis) ([place])     Past Surgical History:  Procedure Laterality Date  . ANKLE FUSION Right   . BREAST BIOPSY    . CARDIOVERSION N/A 08/15/2016   Procedure: CARDIOVERSION;  Surgeon: Jerline Pain, MD;  Location: [facility] ENDOSCOPY;  Service: Cardiovascular;  Laterality: N/A;  . CHOLECYSTECTOMY    . GALLBLADDER SURGERY    . HYSTEROTOMY     Patrtial  . HYSTEROTOMY     Complete  . KNEE ARTHROSCOPY    . LEFT HEART CATH AND CORONARY ANGIOGRAPHY N/A 11/16/2017   Procedure: LEFT HEART CATH AND CORONARY ANGIOGRAPHY;  Surgeon: Martinique, Peter M, MD;  Location: [facility] CV LAB;  Service: Cardiovascular;   Laterality: N/A;     Current Outpatient Medications  Medication Sig Dispense Refill  . amLODipine (NORVASC) 10 MG tablet TAKE 1 TABLET BY MOUTH EVERY DAY 90 tablet 1  . Calcium Carbonate-Vitamin D3 (CALCIUM 600/VITAMIN D) 600-400 MG-UNIT TABS Take 1 tablet by mouth 2 (two) times daily.    . carvedilol (COREG) 25 MG tablet TAKE 1 TABLET BY MOUTH TWICE A DAY WITH A MEAL 180 tablet 2  . COMBIGAN 0.2-0.5 % ophthalmic solution Place 1 drop into both eyes 2 (two) times daily.     . Darbepoetin Alfa (ARANESP) 60 MCG/0.3ML SOSY injection Inject 60 mcg every 30 (thirty) days into the skin.    Marland Kitchen diphenhydramine-acetaminophen (TYLENOL PM) 25-500 MG TABS tablet Take 1 tablet by mouth at bedtime.     . fluticasone (FLONASE) 50 MCG/ACT nasal spray Place 1-2 sprays into both nostrils daily as needed for allergies or rhinitis.    . furosemide (LASIX) 40 MG tablet Take 1 tablet (40 mg total) by mouth daily. 30 tablet 8  . KLOR-CON M20 20 MEQ tablet TAKE 1 TABLET BY MOUTH EVERY DAY 90 tablet 1  . leflunomide (ARAVA) 20 MG tablet Take 10 mg by mouth daily.     . pantoprazole (PROTONIX)  40 MG tablet Take 1 tablet (40 mg total) by mouth daily. 30 tablet 0  . predniSONE (DELTASONE) 5 MG tablet Take 5 mg by mouth daily.     . Probiotic Product (ALIGN PO) Take 1 tablet by mouth daily.    . Rivaroxaban (XARELTO) 15 MG TABS tablet Take 1 tablet (15 mg total) daily with supper by mouth. 30 tablet 8  . simvastatin (ZOCOR) 20 MG tablet Take 20 mg by mouth every evening.  2  . vitamin B-12 (CYANOCOBALAMIN) 1000 MCG tablet Take 1,000 mcg by mouth daily.     No current facility-administered medications for this visit.     Allergies:   Enalapril; Codeine; Oxycodone; and Vicodin [hydrocodone-acetaminophen]    Social History:  The patient  reports that she quit smoking about 21 months ago. she has never used smokeless tobacco. She reports that she does not drink alcohol or use drugs.   Family History:  The patient's  family history includes Hypertension in her mother; Stomach cancer in her father.    ROS:  General:no colds or fevers, + weight gain with edema, can hardly walk due to fatigue Skin:no rashes or ulcers HEENT:no blurred vision, no congestion CV:see HPI PUL:see HPI GI:no diarrhea constipation or melena, no indigestion GU:no hematuria, no dysuria MS:no joint pain, no claudication Neuro:no syncope, no lightheadedness Endo:+ diabetes, no thyroid disease  Wt Readings from Last 3 Encounters:  01/14/18 265 lb (120.2 kg)  11/16/17 255 lb (115.7 kg)  11/10/17 257 lb 3.2 oz (116.7 kg)     PHYSICAL EXAM: VS:  BP (!) 148/90   Pulse 96   Ht 5' 2.5" (1.588 m)   Wt 265 lb (120.2 kg)   LMP  (LMP Unknown)   SpO2 (!) 85%   BMI 47.70 kg/m  , BMI Body mass index is 47.7 kg/m. General:Pleasant affect, NAD Skin:Warm and dry, brisk capillary refill HEENT:normocephalic, sclera clear, mucus membranes moist Neck:supple, no JVD, no bruits sitting upright Heart:irreg irreg without murmur, gallup, rub or click Lungs:clear to diminished without rales, rhonchi, or wheezes GEZ:MOQH, non tender, + BS, do not palpate liver spleen or masses Ext:3+ lower ext edema, to knees bil.   2+ radial pulses Neuro:alert and oriented X 3, MAE, follows commands, + facial symmetry    EKG:  EKG is NOT ordered today.   Recent Labs: 03/14/2017: B Natriuretic Peptide 435.7 03/15/2017: Magnesium 1.6 12/14/2017: ALT 11; BUN 25; Creatinine, Ser 2.45; Potassium 3.6; Sodium 141 01/13/2018: Hemoglobin 10.6; Platelets 203    Lipid Panel    Component Value Date/Time   CHOL 150 07/06/2011 0216   TRIG 123 07/06/2011 0216   HDL 46 07/06/2011 0216   CHOLHDL 3.3 07/06/2011 0216   VLDL 25 07/06/2011 0216   LDLCALC 79 07/06/2011 0216       Other studies Reviewed: Additional studies/ records that were reviewed today include: .  Procedures   LEFT HEART CATH AND CORONARY ANGIOGRAPHY  Conclusion     LV end diastolic  pressure is mildly elevated.   1. Normal coronary angiography 2. Elevated LVEDP  Plan: medical therapy.    Echo 03/15/17 Study Conclusions  - Left ventricle: The cavity size was normal. There was mild   concentric hypertrophy. Systolic function was vigorous. The   estimated ejection fraction was in the range of 65% to 70%. Wall   motion was normal; there were no regional wall motion   abnormalities. Doppler parameters are consistent with high   ventricular filling pressure. - Aortic valve: There  was mild regurgitation. - Mitral valve: There was trivial regurgitation. Valve area by   continuity equation (using LVOT flow): 2.49 cm^2. - Left atrium: The atrium was moderately dilated. - Pulmonary arteries: Systolic pressure could not be accurately   estimated.   ASSESSMENT AND PLAN:  1.  Diastolic HF today with elevated BP and lower ext edema.  Significant edema, has not been taking her lasix dialy, she will take 80 mg today then 40 mg daily along with her K+ will check her BMP today.   I will see her back in 3 weeks but will check with her next week.    2.  Patent coronary arteries on cath. In Nov 2018  3.  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  Permanent a fib on xarelto 15 mg with no bleeding. Rate controlled.   5.  Morbid obesity discussed diet   6.  CKD 4 followed by renal.       Current medicines are reviewed with the patient today.  The patient Has no concerns regarding medicines.  The following changes have been made:  See above Labs/ tests ordered today include:see above  Disposition:   FU:  see above  Signed, Cecilie Kicks, NP  01/14/2018 11:35 AM    [address], [place], [place], [place] Phone: [phone]; Fax: [phone]

## 2018-01-14 NOTE — Patient Instructions (Addendum)
Medication Instructions:  1. TODAY ONLY YOU WILL TAKE LASIX TO 80 MG (THIS WILL BE 2 TABS TODAY); STARTING TOMORROW YOU WILL GO BACK TO YOUR REGULAR DOSE OF LASIX 40 MG DAILY  2. TODAY ONLY YOU WILL TAKE 40 MEQ OF POTASSIUM (THIS WILL BE 2 TABS TODAY); STARTING TOMORROW YOU WILL GO BACK TO YOUR REGULAR DOSE OF POTASSIUM 20 MEQ DAILY  Labwork: TODAY BMET  Testing/Procedures: NONE ORDERED TODAY  Follow-Up: 02/11/18 @ 10:30 WITH Melissa Kicks, NP   Any Other Special Instructions Will Be Listed Below (If Applicable).     If you need a refill on your cardiac medications before your next appointment, please call your pharmacy.

## 2018-01-15 ENCOUNTER — Telehealth: Payer: Self-pay | Admitting: *Deleted

## 2018-01-15 DIAGNOSIS — Z79899 Other long term (current) drug therapy: Secondary | ICD-10-CM

## 2018-01-15 NOTE — Telephone Encounter (Signed)
-----   Message from Melissa Serge, NP sent at 01/14/2018  5:37 PM EST ----- Please let her know kidney function is a little more stressed due to edema, let's recheck BMP Tuesday next week - is her swelling any better after the higher lasix.?

## 2018-01-19 ENCOUNTER — Other Ambulatory Visit: Payer: Medicare Other

## 2018-01-19 DIAGNOSIS — Z79899 Other long term (current) drug therapy: Secondary | ICD-10-CM

## 2018-01-19 LAB — BASIC METABOLIC PANEL
BUN / CREAT RATIO: 9 — AB (ref 12–28)
BUN: 24 mg/dL (ref 8–27)
CO2: 26 mmol/L (ref 20–29)
CREATININE: 2.65 mg/dL — AB (ref 0.57–1.00)
Calcium: 7.2 mg/dL — ABNORMAL LOW (ref 8.7–10.3)
Chloride: 106 mmol/L (ref 96–106)
GFR calc Af Amer: 20 mL/min/{1.73_m2} — ABNORMAL LOW (ref >59)
GFR, EST NON AFRICAN AMERICAN: 17 mL/min/{1.73_m2} — AB (ref >59)
Glucose: 134 mg/dL — ABNORMAL HIGH (ref 65–99)
Potassium: 4.3 mmol/L (ref 3.5–5.2)
SODIUM: 145 mmol/L — AB (ref 134–144)

## 2018-01-20 DIAGNOSIS — H04123 Dry eye syndrome of bilateral lacrimal glands: Secondary | ICD-10-CM | POA: Diagnosis not present

## 2018-01-20 DIAGNOSIS — E119 Type 2 diabetes mellitus without complications: Secondary | ICD-10-CM | POA: Diagnosis not present

## 2018-01-20 DIAGNOSIS — H4053X1 Glaucoma secondary to other eye disorders, bilateral, mild stage: Secondary | ICD-10-CM | POA: Diagnosis not present

## 2018-01-27 ENCOUNTER — Other Ambulatory Visit: Payer: Self-pay

## 2018-01-27 ENCOUNTER — Emergency Department (HOSPITAL_COMMUNITY): Payer: Medicare Other

## 2018-01-27 ENCOUNTER — Encounter (HOSPITAL_COMMUNITY): Payer: Self-pay | Admitting: *Deleted

## 2018-01-27 ENCOUNTER — Inpatient Hospital Stay (HOSPITAL_COMMUNITY)
Admission: EM | Admit: 2018-01-27 | Discharge: 2018-01-30 | DRG: 291 | Disposition: A | Discharge status: Home / Self Care | Payer: Medicare Other | Attending: Family Medicine | Admitting: Family Medicine

## 2018-01-27 DIAGNOSIS — I4891 Unspecified atrial fibrillation: Secondary | ICD-10-CM | POA: Diagnosis not present

## 2018-01-27 DIAGNOSIS — R791 Abnormal coagulation profile: Secondary | ICD-10-CM | POA: Diagnosis present

## 2018-01-27 DIAGNOSIS — E785 Hyperlipidemia, unspecified: Secondary | ICD-10-CM | POA: Diagnosis present

## 2018-01-27 DIAGNOSIS — R0902 Hypoxemia: Secondary | ICD-10-CM | POA: Diagnosis not present

## 2018-01-27 DIAGNOSIS — Z981 Arthrodesis status: Secondary | ICD-10-CM

## 2018-01-27 DIAGNOSIS — Z833 Family history of diabetes mellitus: Secondary | ICD-10-CM | POA: Diagnosis not present

## 2018-01-27 DIAGNOSIS — Z6841 Body Mass Index (BMI) 40.0 and over, adult: Secondary | ICD-10-CM | POA: Diagnosis not present

## 2018-01-27 DIAGNOSIS — I340 Nonrheumatic mitral (valve) insufficiency: Secondary | ICD-10-CM | POA: Diagnosis not present

## 2018-01-27 DIAGNOSIS — M069 Rheumatoid arthritis, unspecified: Secondary | ICD-10-CM | POA: Diagnosis not present

## 2018-01-27 DIAGNOSIS — R801 Persistent proteinuria, unspecified: Secondary | ICD-10-CM | POA: Diagnosis not present

## 2018-01-27 DIAGNOSIS — M7989 Other specified soft tissue disorders: Secondary | ICD-10-CM | POA: Diagnosis not present

## 2018-01-27 DIAGNOSIS — I5033 Acute on chronic diastolic (congestive) heart failure: Secondary | ICD-10-CM | POA: Diagnosis not present

## 2018-01-27 DIAGNOSIS — I130 Hypertensive heart and chronic kidney disease with heart failure and stage 1 through stage 4 chronic kidney disease, or unspecified chronic kidney disease: Principal | ICD-10-CM | POA: Diagnosis present

## 2018-01-27 DIAGNOSIS — I080 Rheumatic disorders of both mitral and aortic valves: Secondary | ICD-10-CM | POA: Diagnosis present

## 2018-01-27 DIAGNOSIS — I5031 Acute diastolic (congestive) heart failure: Secondary | ICD-10-CM | POA: Diagnosis present

## 2018-01-27 DIAGNOSIS — N184 Chronic kidney disease, stage 4 (severe): Secondary | ICD-10-CM | POA: Diagnosis present

## 2018-01-27 DIAGNOSIS — Z79899 Other long term (current) drug therapy: Secondary | ICD-10-CM | POA: Diagnosis not present

## 2018-01-27 DIAGNOSIS — I351 Nonrheumatic aortic (valve) insufficiency: Secondary | ICD-10-CM | POA: Diagnosis not present

## 2018-01-27 DIAGNOSIS — J9601 Acute respiratory failure with hypoxia: Secondary | ICD-10-CM | POA: Diagnosis not present

## 2018-01-27 DIAGNOSIS — R0602 Shortness of breath: Secondary | ICD-10-CM | POA: Diagnosis not present

## 2018-01-27 DIAGNOSIS — Z90711 Acquired absence of uterus with remaining cervical stump: Secondary | ICD-10-CM

## 2018-01-27 DIAGNOSIS — I4821 Permanent atrial fibrillation: Secondary | ICD-10-CM | POA: Diagnosis present

## 2018-01-27 DIAGNOSIS — Z800 Family history of malignant neoplasm of digestive organs: Secondary | ICD-10-CM

## 2018-01-27 DIAGNOSIS — I252 Old myocardial infarction: Secondary | ICD-10-CM | POA: Diagnosis not present

## 2018-01-27 DIAGNOSIS — E669 Obesity, unspecified: Secondary | ICD-10-CM | POA: Diagnosis not present

## 2018-01-27 DIAGNOSIS — E1122 Type 2 diabetes mellitus with diabetic chronic kidney disease: Secondary | ICD-10-CM | POA: Diagnosis present

## 2018-01-27 DIAGNOSIS — Z888 Allergy status to other drugs, medicaments and biological substances status: Secondary | ICD-10-CM | POA: Diagnosis not present

## 2018-01-27 DIAGNOSIS — Z885 Allergy status to narcotic agent status: Secondary | ICD-10-CM | POA: Diagnosis not present

## 2018-01-27 DIAGNOSIS — Z7901 Long term (current) use of anticoagulants: Secondary | ICD-10-CM | POA: Diagnosis not present

## 2018-01-27 DIAGNOSIS — I482 Chronic atrial fibrillation: Secondary | ICD-10-CM | POA: Diagnosis present

## 2018-01-27 DIAGNOSIS — R7989 Other specified abnormal findings of blood chemistry: Secondary | ICD-10-CM | POA: Diagnosis not present

## 2018-01-27 DIAGNOSIS — E118 Type 2 diabetes mellitus with unspecified complications: Secondary | ICD-10-CM

## 2018-01-27 DIAGNOSIS — I10 Essential (primary) hypertension: Secondary | ICD-10-CM | POA: Diagnosis present

## 2018-01-27 DIAGNOSIS — Z7952 Long term (current) use of systemic steroids: Secondary | ICD-10-CM

## 2018-01-27 DIAGNOSIS — E1129 Type 2 diabetes mellitus with other diabetic kidney complication: Secondary | ICD-10-CM

## 2018-01-27 DIAGNOSIS — Z9119 Patient's noncompliance with other medical treatment and regimen: Secondary | ICD-10-CM | POA: Diagnosis not present

## 2018-01-27 DIAGNOSIS — G4733 Obstructive sleep apnea (adult) (pediatric): Secondary | ICD-10-CM | POA: Diagnosis not present

## 2018-01-27 DIAGNOSIS — Z8249 Family history of ischemic heart disease and other diseases of the circulatory system: Secondary | ICD-10-CM | POA: Diagnosis not present

## 2018-01-27 DIAGNOSIS — D509 Iron deficiency anemia, unspecified: Secondary | ICD-10-CM | POA: Diagnosis present

## 2018-01-27 DIAGNOSIS — Z9049 Acquired absence of other specified parts of digestive tract: Secondary | ICD-10-CM

## 2018-01-27 DIAGNOSIS — D631 Anemia in chronic kidney disease: Secondary | ICD-10-CM | POA: Diagnosis not present

## 2018-01-27 DIAGNOSIS — I129 Hypertensive chronic kidney disease with stage 1 through stage 4 chronic kidney disease, or unspecified chronic kidney disease: Secondary | ICD-10-CM | POA: Diagnosis not present

## 2018-01-27 DIAGNOSIS — Z87891 Personal history of nicotine dependence: Secondary | ICD-10-CM

## 2018-01-27 DIAGNOSIS — I480 Paroxysmal atrial fibrillation: Secondary | ICD-10-CM | POA: Diagnosis present

## 2018-01-27 DIAGNOSIS — R5383 Other fatigue: Secondary | ICD-10-CM | POA: Diagnosis not present

## 2018-01-27 HISTORY — DX: Adverse effect of unspecified anesthetic, initial encounter: T41.45XA

## 2018-01-27 HISTORY — DX: Other complications of anesthesia, initial encounter: T88.59XA

## 2018-01-27 HISTORY — DX: Heart failure, unspecified: I50.9

## 2018-01-27 LAB — CBC
HEMATOCRIT: 35.5 % — AB (ref 36.0–46.0)
HEMOGLOBIN: 10.2 g/dL — AB (ref 12.0–15.0)
MCH: 25.8 pg — ABNORMAL LOW (ref 26.0–34.0)
MCHC: 28.7 g/dL — ABNORMAL LOW (ref 30.0–36.0)
MCV: 89.9 fL (ref 78.0–100.0)
Platelets: 222 10*3/uL (ref 150–400)
RBC: 3.95 MIL/uL (ref 3.87–5.11)
RDW: 18.6 % — ABNORMAL HIGH (ref 11.5–15.5)
WBC: 9.7 10*3/uL (ref 4.0–10.5)

## 2018-01-27 LAB — BASIC METABOLIC PANEL
ANION GAP: 11 (ref 5–15)
BUN: 28 mg/dL — ABNORMAL HIGH (ref 6–20)
CHLORIDE: 107 mmol/L (ref 101–111)
CO2: 24 mmol/L (ref 22–32)
Calcium: 7.4 mg/dL — ABNORMAL LOW (ref 8.9–10.3)
Creatinine, Ser: 2.82 mg/dL — ABNORMAL HIGH (ref 0.44–1.00)
GFR calc Af Amer: 18 mL/min — ABNORMAL LOW (ref >60)
GFR, EST NON AFRICAN AMERICAN: 16 mL/min — AB (ref >60)
Glucose, Bld: 113 mg/dL — ABNORMAL HIGH (ref 65–99)
POTASSIUM: 4.7 mmol/L (ref 3.5–5.1)
SODIUM: 142 mmol/L (ref 135–145)

## 2018-01-27 LAB — BRAIN NATRIURETIC PEPTIDE: B NATRIURETIC PEPTIDE 5: 739.5 pg/mL — AB (ref 0.0–100.0)

## 2018-01-27 LAB — I-STAT TROPONIN, ED: Troponin i, poc: 0 ng/mL (ref 0.00–0.08)

## 2018-01-27 LAB — TSH: TSH: 1.919 u[IU]/mL (ref 0.350–4.500)

## 2018-01-27 LAB — D-DIMER, QUANTITATIVE (NOT AT ARMC): D DIMER QUANT: 1.63 ug{FEU}/mL — AB (ref 0.00–0.50)

## 2018-01-27 MED ORDER — LEFLUNOMIDE 10 MG PO TABS: 10.0000 mg | ORAL_TABLET | Freq: Every day | ORAL | Status: DC

## 2018-01-27 MED ORDER — CARVEDILOL 12.5 MG PO TABS: 25.0000 mg | ORAL_TABLET | Freq: Two times a day (BID) | ORAL | Status: DC

## 2018-01-27 MED ORDER — CARVEDILOL 25 MG PO TABS
25.0000 mg | ORAL_TABLET | Freq: Two times a day (BID) | ORAL | Status: DC
  Administered 2018-01-27 – 2018-01-30 (×6): 25 mg via ORAL
  Filled 2018-01-27: qty 1
  Filled 2018-01-27: qty 2
  Filled 2018-01-27 (×4): qty 1

## 2018-01-27 MED ORDER — SIMVASTATIN 20 MG PO TABS
20.0000 mg | ORAL_TABLET | Freq: Every evening | ORAL | Status: DC
  Administered 2018-01-28 – 2018-01-29 (×3): 20 mg via ORAL
  Filled 2018-01-27 (×3): qty 1

## 2018-01-27 MED ORDER — SODIUM CHLORIDE 0.9% FLUSH
3.0000 mL | Freq: Two times a day (BID) | INTRAVENOUS | Status: DC
  Administered 2018-01-28 – 2018-01-30 (×5): 3 mL via INTRAVENOUS

## 2018-01-27 MED ORDER — PANTOPRAZOLE SODIUM 40 MG PO TBEC
40.0000 mg | DELAYED_RELEASE_TABLET | Freq: Every day | ORAL | Status: DC
  Administered 2018-01-28 – 2018-01-30 (×3): 40 mg via ORAL
  Filled 2018-01-27 (×3): qty 1

## 2018-01-27 MED ORDER — SODIUM CHLORIDE 0.9% FLUSH: 3.0000 mL | INTRAVENOUS | Status: DC | PRN

## 2018-01-27 MED ORDER — FUROSEMIDE 10 MG/ML IJ SOLN
40.0000 mg | Freq: Two times a day (BID) | INTRAMUSCULAR | Status: DC
  Administered 2018-01-27 – 2018-01-30 (×6): 40 mg via INTRAVENOUS
  Filled 2018-01-27 (×6): qty 4

## 2018-01-27 MED ORDER — IPRATROPIUM-ALBUTEROL 0.5-2.5 (3) MG/3ML IN SOLN
3.0000 mL | Freq: Once | RESPIRATORY_TRACT | Status: AC
  Administered 2018-01-27: 3 mL via RESPIRATORY_TRACT
  Filled 2018-01-27: qty 3

## 2018-01-27 MED ORDER — BRIMONIDINE TARTRATE 0.2 % OP SOLN
1.0000 [drp] | Freq: Two times a day (BID) | OPHTHALMIC | Status: DC
  Administered 2018-01-27 – 2018-01-30 (×6): 1 [drp] via OPHTHALMIC
  Filled 2018-01-27 (×2): qty 5

## 2018-01-27 MED ORDER — CALCIUM CARBONATE-VITAMIN D3 600-400 MG-UNIT PO TABS: 1.0000 | ORAL_TABLET | Freq: Two times a day (BID) | ORAL | Status: DC

## 2018-01-27 MED ORDER — DIPHENHYDRAMINE-APAP (SLEEP) 25-500 MG PO TABS: 1.0000 | ORAL_TABLET | Freq: Every day | ORAL | Status: DC

## 2018-01-27 MED ORDER — SODIUM CHLORIDE 0.9 % IV SOLN: 250.0000 mL | INTRAVENOUS | Status: DC | PRN

## 2018-01-27 MED ORDER — BRIMONIDINE TARTRATE-TIMOLOL 0.2-0.5 % OP SOLN
1.0000 [drp] | Freq: Two times a day (BID) | OPHTHALMIC | Status: DC
  Filled 2018-01-27: qty 5

## 2018-01-27 MED ORDER — IPRATROPIUM-ALBUTEROL 0.5-2.5 (3) MG/3ML IN SOLN: 3.0000 mL | RESPIRATORY_TRACT | Status: DC | PRN

## 2018-01-27 MED ORDER — CALCIUM CARBONATE-VITAMIN D 500-200 MG-UNIT PO TABS
1.0000 | ORAL_TABLET | Freq: Two times a day (BID) | ORAL | Status: DC
  Administered 2018-01-28 – 2018-01-30 (×5): 1 via ORAL
  Filled 2018-01-27 (×6): qty 1

## 2018-01-27 MED ORDER — RIVAROXABAN 15 MG PO TABS
15.0000 mg | ORAL_TABLET | Freq: Every day | ORAL | Status: DC
  Administered 2018-01-28 – 2018-01-29 (×2): 15 mg via ORAL
  Filled 2018-01-27 (×2): qty 1

## 2018-01-27 MED ORDER — ACETAMINOPHEN 325 MG PO TABS: 650.0000 mg | ORAL_TABLET | ORAL | Status: DC | PRN

## 2018-01-27 MED ORDER — LEFLUNOMIDE 20 MG PO TABS
10.0000 mg | ORAL_TABLET | Freq: Every day | ORAL | Status: DC
  Administered 2018-01-28 – 2018-01-30 (×3): 10 mg via ORAL
  Filled 2018-01-27 (×4): qty 1

## 2018-01-27 MED ORDER — PREDNISONE 5 MG PO TABS
5.0000 mg | ORAL_TABLET | Freq: Every day | ORAL | Status: DC
  Administered 2018-01-28 – 2018-01-30 (×3): 5 mg via ORAL
  Filled 2018-01-27 (×4): qty 1

## 2018-01-27 MED ORDER — ONDANSETRON HCL 4 MG/2ML IJ SOLN: 4.0000 mg | Freq: Four times a day (QID) | INTRAMUSCULAR | Status: DC | PRN

## 2018-01-27 MED ORDER — POTASSIUM CHLORIDE CRYS ER 20 MEQ PO TBCR
20.0000 meq | EXTENDED_RELEASE_TABLET | Freq: Every day | ORAL | Status: DC
  Administered 2018-01-28 – 2018-01-30 (×3): 20 meq via ORAL
  Filled 2018-01-27 (×3): qty 1

## 2018-01-27 MED ORDER — TIMOLOL MALEATE 0.5 % OP SOLN
1.0000 [drp] | Freq: Two times a day (BID) | OPHTHALMIC | Status: DC
  Administered 2018-01-27 – 2018-01-30 (×6): 1 [drp] via OPHTHALMIC
  Filled 2018-01-27 (×2): qty 5

## 2018-01-27 MED ORDER — AMLODIPINE BESYLATE 10 MG PO TABS
10.0000 mg | ORAL_TABLET | Freq: Every day | ORAL | Status: DC
  Administered 2018-01-28 – 2018-01-30 (×3): 10 mg via ORAL
  Filled 2018-01-27 (×3): qty 1

## 2018-01-27 NOTE — ED Notes (Signed)
IV team at bedside. Requested blood draw if possible with IV start

## 2018-01-27 NOTE — ED Provider Notes (Signed)
[facility] EMERGENCY DEPARTMENT Provider Note   CSN: [id number] Arrival date & time: 01/27/18  1407     History   Chief Complaint Chief Complaint  Patient presents with  . Fatigue  . Shortness of Breath    HPI Melissa Bartlett is a 72 y.o. female.  The history is provided by the patient, the spouse and medical records. No language interpreter was used.  Illness  This is a new problem. The current episode started more than 1 week ago (3 weeks fatigue). The problem occurs constantly. The problem has not changed since onset.Pertinent negatives include no chest pain, no abdominal pain, no headaches and no shortness of breath. Nothing aggravates the symptoms. Nothing relieves the symptoms. She has tried nothing for the symptoms. The treatment provided no relief.    Past Medical History:  Diagnosis Date  . CKD (chronic kidney disease), stage IV ([place])   . Diabetes ([place])   . HTN (hypertension)   . OSA (obstructive sleep apnea)    with AHI 11/hr with nocturnal hypoxemia   . PAF (paroxysmal atrial fibrillation) ([place])    s/p DCCV in 8/17  . RA (rheumatoid arthritis) [facility])     Patient Active Problem List   Diagnosis Date Noted  . Chest pain, rule out acute myocardial infarction 10/28/2017  . Syncope 03/14/2017  . Chest pain 03/14/2017  . OSA (obstructive sleep apnea)   . Paroxysmal atrial fibrillation ([place]) 07/31/2016  . CKD (chronic kidney disease) stage 4, GFR 15-29 ml/min ([facility]) 07/31/2016  . Essential hypertension 07/09/2015  . Morbid obesity ([place]) 07/09/2015  . Diabetes ([place]) 03/07/2014    Past Surgical History:  Procedure Laterality Date  . ANKLE FUSION Right   . BREAST BIOPSY    . CARDIOVERSION N/A 08/15/2016   Procedure: CARDIOVERSION;  Surgeon: Jerline Pain, MD;  Location[facility] ENDOSCOPY;  Service: Cardiovascular;  Laterality: N/A;  . CHOLECYSTECTOMY    . GALLBLADDER SURGERY    . HYSTEROTOMY     Patrtial  . HYSTEROTOMY     Complete  . KNEE  ARTHROSCOPY    . LEFT HEART CATH AND CORONARY ANGIOGRAPHY N/A 11/16/2017   Procedure: LEFT HEART CATH AND CORONARY ANGIOGRAPHY;  Surgeon: Martinique, Peter M, MD;  Location: [facility] CV LAB;  Service: Cardiovascular;  Laterality: N/A;    OB History    No data available       Home Medications    Prior to Admission medications   Medication Sig Start Date End Date Taking? Authorizing Provider  amLODipine (NORVASC) 10 MG tablet TAKE 1 TABLET BY MOUTH EVERY DAY 10/02/17   Jerline Pain, MD  Calcium Carbonate-Vitamin D3 (CALCIUM 600/VITAMIN D) 600-400 MG-UNIT TABS Take 1 tablet by mouth 2 (two) times daily.    [provider]  carvedilol (COREG) 25 MG tablet TAKE 1 TABLET BY MOUTH TWICE A DAY WITH A MEAL 09/03/17   Jerline Pain, MD  COMBIGAN 0.2-0.5 % ophthalmic solution Place 1 drop into both eyes 2 (two) times daily.  01/29/14   [provider]  Darbepoetin Alfa (ARANESP) 60 MCG/0.3ML SOSY injection Inject 60 mcg every 30 (thirty) days into the skin.    [provider]  diphenhydramine-acetaminophen (TYLENOL PM) 25-500 MG TABS tablet Take 1 tablet by mouth at bedtime.     [provider]  fluticasone (FLONASE) 50 MCG/ACT nasal spray Place 1-2 sprays into both nostrils daily as needed for allergies or rhinitis.    [provider]  furosemide (LASIX)  40 MG tablet Take 1 tablet (40 mg total) by mouth daily. 10/15/17   Jerline Pain, MD  KLOR-CON M20 20 MEQ tablet TAKE 1 TABLET BY MOUTH EVERY DAY 10/02/17   Jerline Pain, MD  leflunomide (ARAVA) 20 MG tablet Take 10 mg by mouth daily.     [provider]  pantoprazole (PROTONIX) 40 MG tablet Take 1 tablet (40 mg total) by mouth daily. 10/29/17   Geradine Girt, DO  predniSONE (DELTASONE) 5 MG tablet Take 5 mg by mouth daily.  08/22/17   [provider]  Probiotic Product (ALIGN PO) Take 1 tablet by mouth daily.    [provider]  Rivaroxaban (XARELTO) 15 MG TABS tablet Take 1  tablet (15 mg total) daily with supper by mouth. 11/17/17   Martinique, Peter M, MD  simvastatin (ZOCOR) 20 MG tablet Take 20 mg by mouth every evening. 06/21/16   [provider]  vitamin B-12 (CYANOCOBALAMIN) 1000 MCG tablet Take 1,000 mcg by mouth daily.    [provider]    Family History Family History  Problem Relation Age of Onset  . Hypertension Mother        Family HX Diabetes  . Stomach cancer Father     Social History Social History   Tobacco Use  . Smoking status: Former Smoker    Last attempt to quit: 04/08/2016    Years since quitting: 1.8  . Smokeless tobacco: Never Used  Substance Use Topics  . Alcohol use: No    Alcohol/week: 0.0 oz  . Drug use: No     Allergies   Enalapril; Codeine; Oxycodone; and Vicodin [hydrocodone-acetaminophen]   Review of Systems Review of Systems  Constitutional: Negative for chills, diaphoresis, fatigue and fever.  HENT: Negative for congestion.   Eyes: Negative for visual disturbance.  Respiratory: Negative for cough, chest tightness, shortness of breath, wheezing and stridor.   Cardiovascular: Positive for leg swelling. Negative for chest pain and palpitations.  Gastrointestinal: Negative for abdominal pain, constipation, diarrhea, nausea and vomiting.  Genitourinary: Negative for difficulty urinating and dyspareunia.  Musculoskeletal: Negative for back pain, neck pain and neck stiffness.  Neurological: Negative for light-headedness, numbness and headaches.  Psychiatric/Behavioral: Negative for agitation.  All other systems reviewed and are negative.    Physical Exam Updated Vital Signs BP 134/72 (BP Location: Right Arm)   Pulse 89   Temp 98.5 F (36.9 C) (Oral)   Resp 20   LMP  (LMP Unknown)   SpO2 96%   Physical Exam  Constitutional: She is oriented to person, place, and time. She appears well-developed.  Non-toxic appearance. She does not appear ill. No distress.  HENT:  Head: Normocephalic and  atraumatic.  Mouth/Throat: Oropharynx is clear and moist. No oropharyngeal exudate.  Eyes: EOM are normal. Pupils are equal, round, and reactive to light.  Neck: Normal range of motion.  Cardiovascular: Normal rate and intact distal pulses.  No murmur heard. Pulmonary/Chest: Effort normal. No stridor. No respiratory distress. She has decreased breath sounds in the right lower field and the left lower field. She has no wheezes. She has rales in the right middle field, the right lower field, the left middle field and the left lower field. She exhibits no tenderness.  Musculoskeletal: She exhibits edema. She exhibits no tenderness.  Neurological: She is alert and oriented to person, place, and time. No sensory deficit. She exhibits normal muscle tone.  Skin: Capillary refill takes less than 2 seconds. No rash noted. She  is not diaphoretic. No erythema.  Nursing note and vitals reviewed.    ED Treatments / Results  Labs (all labs ordered are listed, but only abnormal results are displayed) Labs Reviewed  BASIC METABOLIC PANEL - Abnormal; Notable for the following components:      Result Value   Glucose, Bld 113 (*)    BUN 28 (*)    Creatinine, Ser 2.82 (*)    Calcium 7.4 (*)    GFR calc non Af Amer 16 (*)    GFR calc Af Amer 18 (*)    All other components within normal limits  CBC - Abnormal; Notable for the following components:   Hemoglobin 10.2 (*)    HCT 35.5 (*)    MCH 25.8 (*)    MCHC 28.7 (*)    RDW 18.6 (*)    All other components within normal limits  BRAIN NATRIURETIC PEPTIDE - Abnormal; Notable for the following components:   B Natriuretic Peptide 739.5 (*)    All other components within normal limits  D-DIMER, QUANTITATIVE (NOT AT [facility]) - Abnormal; Notable for the following components:   D-Dimer, Quant 1.63 (*)    All other components within normal limits  TSH  BASIC METABOLIC PANEL  TROPONIN I  TROPONIN I  TROPONIN I  CBC WITH DIFFERENTIAL/PLATELET  I-STAT  TROPONIN, ED    EKG  EKG Interpretation  Date/Time:  Wednesday January 27 2018 14:13:29 EST Ventricular Rate:  87 PR Interval:    QRS Duration: 68 QT Interval:  348 QTC Calculation: 418 R Axis:   -91 Text Interpretation:  Atrial fibrillation Indeterminate axis Low voltage QRS Cannot rule out Anterior infarct , age undetermined Abnormal ECG When compared to prior, similar Afib.   No STEMI Confirmed by Antony Blackbird [phone]) on 01/27/2018 4:46:02 PM       Radiology Dg Chest 2 View  Result Date: 01/27/2018 CLINICAL DATA:  Shortness of Breath EXAM: CHEST  2 VIEW COMPARISON:  10/27/2017 FINDINGS: Cardiomegaly. Small right effusion including fluid in the fissures. Right base opacity likely reflects atelectasis. No confluent opacity on the left. No overt edema. IMPRESSION: Right pleural effusion including fissural fluid. Right base atelectasis. Cardiomegaly. Electronically Signed   By: Rolm Baptise M.D.   On: 01/27/2018 16:15    Procedures Procedures (including critical care time)  CRITICAL CARE Performed by: Gwenyth Allegra Ariyah Sedlack Total critical care time: 37 minutes Critical care time was exclusive of separately billable procedures and treating other patients. Persistent oxygen in the 70s requiring oxygen supplementation. Critical care was necessary to treat or prevent imminent or life-threatening deterioration. Critical care was time spent personally by me on the following activities: development of treatment plan with patient and/or surrogate as well as nursing, discussions with consultants, evaluation of patient's response to treatment, examination of patient, obtaining history from patient or surrogate, ordering and performing treatments and interventions, ordering and review of laboratory studies, ordering and review of radiographic studies, pulse oximetry and re-evaluation of patient's condition.  Medications Ordered in ED Medications  ipratropium-albuterol (DUONEB) 0.5-2.5 (3)  MG/3ML nebulizer solution 3 mL (3 mLs Nebulization Given 01/27/18 1924)     Initial Impression / Assessment and Plan / ED Course  I have reviewed the triage vital signs and the nursing notes.  Pertinent labs & imaging results that were available during my care of the patient were reviewed by me and considered in my medical decision making (see chart for details).     GIAVANNA KANG is a 72 y.o.  female with a past medical history significant for hypertension, diabetes, atrial fibrillation on chronic Xarelto, and chronic kidney disease who presents from her PCP office for hypoxia and fatigue.  Patient reports that for the last few weeks she is been feeling tired.  She reports that she has had no shortness of breath, chest pain, palpitations.  She denies any fevers, chills, rhinorrhea, congestion, or cough.  She denies any change in her urination, conservation, or diarrhea.  Patient only reports feeling tired.  When the patient was seen by her nephrologist today, she was found to have oxygen saturations in the 70s.  Patient was quickly sent to the emergency department for evaluation.  On my initial evaluation, patient is pleasantly carrying on conversation without any respiratory distress.  However, when pulse oximetry was assessed, patient had a real option saturation of approximately 74.  Patient was placed on 4 L nasal cannula with improvement into the 90s.  Patient had some rales and decreased breath sounds in the bases bilaterally but otherwise no significant rhonchi.  Chest and back were nontender.  Abdomen nontender.  Patient has pitting edema in both lower extremities which she reports is worsened over the last few weeks despite taking diuretics.  Patient had no other focal abnormal areas on exam.  EKG showed persistent atrial fibrillation.  Chest x-ray showed concern for atelectasis and pleural effusion.  Clinically I am concerned about fluid overload with the hypoxia, leg edema, and crackles  on breath sounds with x-ray findings.  Patient will have a d-dimer and BNP added.    Anticipate admission for persistent hypoxia with new oxygen requirement as patient is not on home oxygen.  D-dimer was found to be elevated.  Patient will have a PE study prior to admission for hypoxia.  Laboratory testing came back otherwise showing elevated but similar creatinine  of 2.82.  TSH normal.  Troponin negative.  BNP was elevated higher than before at 739.5.  Suspect patient has fluid overload however will rule out PE before admission.  After CT was ordered, it was found that patient's GFR was too low for PE study.  Thus, patient will likely need VQ scan.  Hospitalist team will be called for admission for treatment of hypoxia with likely fluid overload versus PE.  Patient will be admitted for further management.  Final Clinical Impressions(s) / ED Diagnoses   Final diagnoses:  Hypoxia    ED Discharge Orders    None     Clinical Impression: 1. Hypoxia     Disposition: Admit  This note was prepared with assistance of Dragon voice recognition software. Occasional wrong-word or sound-a-like substitutions may have occurred due to the inherent limitations of voice recognition software.      Booker Bhatnagar, Gwenyth Allegra, MD 01/27/18 2213

## 2018-01-27 NOTE — ED Notes (Signed)
pts on 2L/[place]. Sp02 stats at 96%

## 2018-01-27 NOTE — ED Triage Notes (Signed)
Pt went for check up today and sent here due to low spo2 on room air. Pt reports recent fatigue/weakness but denies sob. Having lower leg swelling and started on fluid pills. Denies cough.

## 2018-01-27 NOTE — ED Notes (Signed)
Attempted IV, unsuccessful.

## 2018-01-27 NOTE — ED Notes (Signed)
Upon being roomed pt oxygen saturation remained 74-76% on room air. Pt then placed on 2L  and oxygen saturation maintained at 85, O2 increased to 4L pt maintaining satisfactory saturation on 4L. MD at bedside during this assessment.

## 2018-01-27 NOTE — ED Notes (Signed)
IV team able to receive blood but unable to gain IV access at this time. Plan to reevaluate gauge size after lab results return and plan is definitive.

## 2018-01-27 NOTE — H&P (Addendum)
History and Physical    Melissa Bartlett WPY:[id number] DOB: 1946-04-30 DOA: 01/27/2018  Referring MD/NP/PA: Dr. Marda Stalker PCP: Donald Prose, MD  Patient coming from: From nephrologist office via private vehicle  Chief Complaint: Fatigue  I have personally briefly reviewed patient's old medical records in [place]   HPI: Melissa Bartlett is a 72 y.o. female with medical history significant of HTN, diastolic CHF last EF 05-39% followed by Dr. Marlou Porch, permanent A. fib on Xarelto s/p DCCV in 07/2016, RA, CKD stage IV, OSA on CPAP; who presented with complaints of extreme fatigue.  Patient notes symptoms have been present since October 2018, but have been significantly worse over the last few weeks.  She complains of having lower extremity swelling in both legs making it difficult for her to walk. Reports having difficulty completing ADLs due to her symptoms.  Last 2-3 weeks she reports that her weight increased approximately 10 pounds based on her last 2 doctor visits.  She was seen in her cardiologist's office 12 days ago and at that time was given one-time dose 80 mg Lasix and advised to take 40 mg of Lasix daily.  Prior to this patient noted at some point being told not to take 40 mg of Lasix daily due to kidney function.  Associated symptoms include 2-3 pillow orthopnea, chills, and intermittent nausea that she states is all chronic.  Denies having any significant fever, chest pain, palpitations, cough, wheezing, dysuria, or diarrhea.  She has been seen by her nephrologist Dr. Virginia Crews today for regular appointment and her O2 saturations were noted to be around 76% on room air for which she was placed on nasal cannula oxygen.  Last admitted into the hospital in 10/2017 for chest pain. She had a normal nuclear stress test on 11/03/2017, and normal left heart catheterization performed on 11/16/2017 Dr. Peter Martinique.   ED Course: Upon admission into the emergency department patient  was noted to have O2 saturations of 74-76% on room air and placed to 4-5 L nasal cannula oxygen to improve O2 saturations to greater than 92%.  Labs revealed WBC 9.7, hemoglobin 10.2, BUN 28, creatinine 2.82, calcium 7.4, BNP 739.5, and TSH 1.919.  Chest x-ray showing cardiomegaly with right-sided pleural effusion.  TRH called to admit.  Review of Systems  Constitutional: Positive for chills (Chronic) and malaise/fatigue. Negative for fever.  HENT: Negative for congestion and nosebleeds.   Eyes: Negative for pain and redness.  Respiratory: Positive for shortness of breath. Negative for cough, sputum production and wheezing.   Cardiovascular: Positive for orthopnea and leg swelling. Negative for chest pain.  Gastrointestinal: Positive for nausea (Intermittent and chronic). Negative for abdominal pain, blood in stool, diarrhea and vomiting.  Genitourinary: Negative for dysuria and hematuria.  Musculoskeletal: Negative for falls and neck pain.  Skin: Negative for itching and rash.  Neurological: Negative for focal weakness and loss of consciousness.  Endo/Heme/Allergies: Negative for polydipsia.  Psychiatric/Behavioral: Negative for hallucinations and substance abuse.    Past Medical History:  Diagnosis Date  . CKD (chronic kidney disease), stage IV ([place])   . Diabetes ([place])   . HTN (hypertension)   . OSA (obstructive sleep apnea)    with AHI 11/hr with nocturnal hypoxemia   . PAF (paroxysmal atrial fibrillation) ([place])    s/p DCCV in 8/17  . RA (rheumatoid arthritis) ([place])     Past Surgical History:  Procedure Laterality Date  . ANKLE FUSION Right   . BREAST BIOPSY    .  CARDIOVERSION N/A 08/15/2016   Procedure: CARDIOVERSION;  Surgeon: Jerline Pain, MD;  Location: [facility] ENDOSCOPY;  Service: Cardiovascular;  Laterality: N/A;  . CHOLECYSTECTOMY    . GALLBLADDER SURGERY    . HYSTEROTOMY     Patrtial  . HYSTEROTOMY     Complete  . KNEE ARTHROSCOPY    . LEFT HEART CATH AND CORONARY  ANGIOGRAPHY N/A 11/16/2017   Procedure: LEFT HEART CATH AND CORONARY ANGIOGRAPHY;  Surgeon: Martinique, Peter M, MD;  Location: [place] CV LAB;  Service: Cardiovascular;  Laterality: N/A;     reports that she quit smoking about 21 months ago. she has never used smokeless tobacco. She reports that she does not drink alcohol or use drugs.  Allergies  Allergen Reactions  . Enalapril Other (See Comments)    Systemic reaction - jerking  . Codeine Itching  . Oxycodone Itching  . Vicodin [Hydrocodone-Acetaminophen] Itching    Family History  Problem Relation Age of Onset  . Hypertension Mother        Family HX Diabetes  . Stomach cancer Father     Prior to Admission medications   Medication Sig Start Date End Date Taking? Authorizing Provider  amLODipine (NORVASC) 10 MG tablet TAKE 1 TABLET BY MOUTH EVERY DAY Patient taking differently: TAKE 10 mgTABLET BY MOUTH EVERY DAY 10/02/17  Yes Jerline Pain, MD  Calcium Carbonate-Vitamin D3 (CALCIUM 600/VITAMIN D) 600-400 MG-UNIT TABS Take 1 tablet by mouth 2 (two) times daily.   Yes [provider]  carvedilol (COREG) 25 MG tablet TAKE 1 TABLET BY MOUTH TWICE A DAY WITH A MEAL Patient taking differently: TAKE 25 mg TABLET BY MOUTH TWICE A DAY WITH A MEAL 09/03/17  Yes Skains, Thana Farr, MD  COMBIGAN 0.2-0.5 % ophthalmic solution Place 1 drop into both eyes 2 (two) times daily.  01/29/14  Yes [provider]  Darbepoetin Alfa (ARANESP) 60 MCG/0.3ML SOSY injection Inject 60 mcg every 30 (thirty) days into the skin.   Yes [provider]  diphenhydramine-acetaminophen (TYLENOL PM) 25-500 MG TABS tablet Take 1 tablet by mouth at bedtime.    Yes [provider]  furosemide (LASIX) 40 MG tablet Take 1 tablet (40 mg total) by mouth daily. 10/15/17  Yes Jerline Pain, MD  KLOR-CON M20 20 MEQ tablet TAKE 1 TABLET BY MOUTH EVERY DAY Patient taking differently: TAKE 20 meq TABLET BY MOUTH EVERY DAY 10/02/17  Yes Jerline Pain,  MD  leflunomide (ARAVA) 10 MG tablet Take 10 mg by mouth daily.    Yes [provider]  pantoprazole (PROTONIX) 40 MG tablet Take 1 tablet (40 mg total) by mouth daily. 10/29/17  Yes Vann, Jessica U, DO  predniSONE (DELTASONE) 5 MG tablet Take 5 mg by mouth daily.  08/22/17  Yes [provider]  Probiotic Product (ALIGN PO) Take 1 tablet by mouth at bedtime.    Yes [provider]  Rivaroxaban (XARELTO) 15 MG TABS tablet Take 1 tablet (15 mg total) daily with supper by mouth. 11/17/17  Yes Martinique, Peter M, MD  simvastatin (ZOCOR) 20 MG tablet Take 20 mg by mouth every evening. 06/21/16  Yes [provider]  vitamin B-12 (CYANOCOBALAMIN) 1000 MCG tablet Take 1,000 mcg by mouth daily.   Yes [provider]    Physical Exam:  Constitutional: Morbid obesity in no acute distress at this time on nasal cannula oxygen Vitals:   01/27/18 1656 01/27/18 1705 01/27/18 1715 01/27/18 1800  BP: (!) 149/100  Marland Kitchen)  145/89 (!) 143/95  Pulse: 87  90 97  Resp: 18  20 18   Temp:      TempSrc:      SpO2: 97% (!) 76% 99% 95%   Eyes: PERRL, lids and conjunctivae normal ENMT: Mucous membranes are moist. Posterior pharynx clear of any exudate or lesions.Normal dentition.  Neck: normal, supple, no masses, no thyromegaly Respiratory: Mildly tachypneic with decreased overall aeration noted on the right lower lung field. Cardiovascular: Regular rate and rhythm, no murmurs / rubs / gallops. No extremity edema. 2+ pedal pulses. No carotid bruits.  Abdomen: no tenderness, no masses palpated. No hepatosplenomegaly. Bowel sounds positive.  Musculoskeletal: no clubbing / cyanosis. No joint deformity upper and lower extremities. Good ROM, no contractures. Normal muscle tone.  Skin: no rashes, lesions, ulcers. No induration Neurologic: CN 2-12 grossly intact. Sensation intact, DTR normal. Strength 5/5 in all 4.  Psychiatric: Normal judgment and insight. Alert and oriented x 3. Normal  mood.     Labs on Admission: I have personally reviewed following labs and imaging studies  CBC: Recent Labs  Lab 01/27/18 1429  WBC 9.7  HGB 10.2*  HCT 35.5*  MCV 89.9  PLT 527   Basic Metabolic Panel: Recent Labs  Lab 01/27/18 1429  NA 142  K 4.7  CL 107  CO2 24  GLUCOSE 113*  BUN 28*  CREATININE 2.82*  CALCIUM 7.4*   GFR: Estimated Creatinine Clearance: 22.8 mL/min (A) (by C-G formula based on SCr of 2.82 mg/dL (H)). Liver Function Tests: No results for input(s): AST, ALT, ALKPHOS, BILITOT, PROT, ALBUMIN in the last 168 hours. No results for input(s): LIPASE, AMYLASE in the last 168 hours. No results for input(s): AMMONIA in the last 168 hours. Coagulation Profile: No results for input(s): INR, PROTIME in the last 168 hours. Cardiac Enzymes: No results for input(s): CKTOTAL, CKMB, CKMBINDEX, TROPONINI in the last 168 hours. BNP (last 3 results) No results for input(s): PROBNP in the last 8760 hours. HbA1C: No results for input(s): HGBA1C in the last 72 hours. CBG: No results for input(s): GLUCAP in the last 168 hours. Lipid Profile: No results for input(s): CHOL, HDL, LDLCALC, TRIG, CHOLHDL, LDLDIRECT in the last 72 hours. Thyroid Function Tests: Recent Labs    01/27/18 1846  TSH 1.919   Anemia Panel: No results for input(s): VITAMINB12, FOLATE, FERRITIN, TIBC, IRON, RETICCTPCT in the last 72 hours. Urine analysis:    Component Value Date/Time   COLORURINE YELLOW 04/28/2010 0304   APPEARANCEUR CLEAR 04/28/2010 0304   LABSPEC 1.016 04/28/2010 0304   PHURINE 5.5 04/28/2010 0304   GLUCOSEU NEGATIVE 04/28/2010 0304   HGBUR NEGATIVE 04/28/2010 0304   BILIRUBINUR NEGATIVE 04/28/2010 0304   KETONESUR NEGATIVE 04/28/2010 0304   PROTEINUR 100 (A) 04/28/2010 0304   UROBILINOGEN 0.2 04/28/2010 0304   NITRITE NEGATIVE 04/28/2010 0304   LEUKOCYTESUR NEGATIVE 04/28/2010 0304   Sepsis Labs: No results found for this or any previous visit (from the past 240  hour(s)).   Radiological Exams on Admission: Dg Chest 2 View  Result Date: 01/27/2018 CLINICAL DATA:  Shortness of Breath EXAM: CHEST  2 VIEW COMPARISON:  10/27/2017 FINDINGS: Cardiomegaly. Small right effusion including fluid in the fissures. Right base opacity likely reflects atelectasis. No confluent opacity on the left. No overt edema. IMPRESSION: Right pleural effusion including fissural fluid. Right base atelectasis. Cardiomegaly. Electronically Signed   By: Rolm Baptise M.D.   On: 01/27/2018 16:15    EKG: Independently reviewed.  Atrial fibrillation at 87  bpm  Assessment/Plan Acute respiratory failure with hypoxia, right-sided pleural effusion 2/2 diastolic CHF exacerbation: Acute.  Patient presents with generalized fatigue and weakness found to be hypoxic on room air.  Chest x-ray showing cardiomegaly with right-sided pleural effusion.  Right-sided pleural effusions most likely associated with CHF BNP noted to be elevated at 739.5.  Last EF noted to be 65-70% by echocardiogram in 02/2017.  Patient followed by Dr. Marlou Porch in the outpatient setting - Admit to a telemetry bed - Heart failure orders set  initiated  - Continuous pulse oximetry with nasal cannula oxygen as needed to keep O2 saturations >92% - Strict I&Os and daily weights - Elevate lower extremities - Lasix 40 mg IV Bid - Reassess in a.m. and adjust diuresis as needed. - Check echocardiogram - Optimize medical management as able - Message sent for cardiology to eval in a.m. - May warrant further investigation of right-sided pleural effusion   Elevated d-dimer: Chronic.  D-dimer was noted to be elevated 1.63 on admission.  Review of records shows the patient had previously elevated d-dimer back in 2012.  - consider need to check V/Q scan in a.m given kidney function  Peripheral edema: Patient noted to have bilateral lower extremity swelling suspect secondary to above was on the differential includes possibility of blood  clot. - Consider need of duplex Doppler ultrasound of lower extremity  Atrial fibrillation on anticoagulation: chronic. Cha2DS2VASc is 4.  Patient s/p DCCV in 07/2016, but presents in atrial fibrillation currently rate controlled.  Reports taking Xarelto as advised.  - Continue Xarelto for now, will need to discuss need of transition to alternative anticoagulant given poor kidney function  Essential hypertension - Continue Coreg, amlodipine   Diabetes mellitus type 2: Patient diet controlled.  Last hemoglobin A1c noted to be 5.6.  Initial glucose on admission noted to be 113. - Heart healthy and carb modified diet  Morbid obesity: BMI 47.6 - Continue to encourage weight loss  Hyperlipidemia  - continue simvastatin  Rheumatoid arthritis - Continue leflunomide  Chronic kidney disease stage IV: Patient presents with a creatinine of 2.82 and BUN of 28.  Her baseline creatinine appears to have ranged from 2.2-3 over the last year.  Patient followed by Dr. Amedeo Gory. - Closely monitor kidney function with diuresis - May want to follow-up with nephrologist in a.m.  Hypochromic anemia: Chronic.  Initial hemoglobin noted to be 10.2 which appears near patient's baseline.  Last studies including iron 56, TIBC 297, ferritin 157, saturation ratio 19 checked on 01/13/2018 - Continue to monitor  Hypocalcemia: Initial calcium 7.4 - Continue home supplementation - Check ionized calcium in a.m. - Adjust supplementation dosage as needed  OSA on CPAP - CPAP per respiratory therapy DVT prophylaxis: Xarelto Code Status: Full Family Communication: none present Disposition Plan: TBD Consults called: none  Admission status: inpatient  Norval Morton MD [facility] Pager [phone]   If 7PM-7AM, please contact night-coverage [email or web address] Password [facility]  01/27/2018, 9:37 PM

## 2018-01-28 ENCOUNTER — Encounter (HOSPITAL_COMMUNITY): Payer: Self-pay | Admitting: General Practice

## 2018-01-28 ENCOUNTER — Inpatient Hospital Stay (HOSPITAL_COMMUNITY): Payer: Medicare Other

## 2018-01-28 DIAGNOSIS — R0902 Hypoxemia: Secondary | ICD-10-CM

## 2018-01-28 DIAGNOSIS — I10 Essential (primary) hypertension: Secondary | ICD-10-CM

## 2018-01-28 DIAGNOSIS — I340 Nonrheumatic mitral (valve) insufficiency: Secondary | ICD-10-CM

## 2018-01-28 DIAGNOSIS — I482 Chronic atrial fibrillation: Secondary | ICD-10-CM

## 2018-01-28 DIAGNOSIS — M069 Rheumatoid arthritis, unspecified: Secondary | ICD-10-CM

## 2018-01-28 DIAGNOSIS — I5033 Acute on chronic diastolic (congestive) heart failure: Secondary | ICD-10-CM

## 2018-01-28 DIAGNOSIS — I4891 Unspecified atrial fibrillation: Secondary | ICD-10-CM

## 2018-01-28 DIAGNOSIS — I351 Nonrheumatic aortic (valve) insufficiency: Secondary | ICD-10-CM

## 2018-01-28 DIAGNOSIS — M7989 Other specified soft tissue disorders: Secondary | ICD-10-CM

## 2018-01-28 DIAGNOSIS — G4733 Obstructive sleep apnea (adult) (pediatric): Secondary | ICD-10-CM

## 2018-01-28 LAB — CBC WITH DIFFERENTIAL/PLATELET
Basophils Absolute: 0 10*3/uL (ref 0.0–0.1)
Basophils Relative: 0 %
EOS ABS: 0.1 10*3/uL (ref 0.0–0.7)
EOS PCT: 1 %
HCT: 37.2 % (ref 36.0–46.0)
Hemoglobin: 10.6 g/dL — ABNORMAL LOW (ref 12.0–15.0)
LYMPHS ABS: 1.8 10*3/uL (ref 0.7–4.0)
LYMPHS PCT: 17 %
MCH: 25.4 pg — AB (ref 26.0–34.0)
MCHC: 28.5 g/dL — AB (ref 30.0–36.0)
MCV: 89.2 fL (ref 78.0–100.0)
MONOS PCT: 10 %
Monocytes Absolute: 1.1 10*3/uL — ABNORMAL HIGH (ref 0.1–1.0)
Neutro Abs: 7.7 10*3/uL (ref 1.7–7.7)
Neutrophils Relative %: 72 %
Platelets: 214 10*3/uL (ref 150–400)
RBC: 4.17 MIL/uL (ref 3.87–5.11)
RDW: 18.2 % — ABNORMAL HIGH (ref 11.5–15.5)
WBC: 10.8 10*3/uL — AB (ref 4.0–10.5)

## 2018-01-28 LAB — BASIC METABOLIC PANEL
Anion gap: 13 (ref 5–15)
BUN: 31 mg/dL — AB (ref 6–20)
CHLORIDE: 106 mmol/L (ref 101–111)
CO2: 24 mmol/L (ref 22–32)
Calcium: 7.3 mg/dL — ABNORMAL LOW (ref 8.9–10.3)
Creatinine, Ser: 2.9 mg/dL — ABNORMAL HIGH (ref 0.44–1.00)
GFR calc Af Amer: 18 mL/min — ABNORMAL LOW (ref >60)
GFR calc non Af Amer: 15 mL/min — ABNORMAL LOW (ref >60)
GLUCOSE: 96 mg/dL (ref 65–99)
Potassium: 4.6 mmol/L (ref 3.5–5.1)
Sodium: 143 mmol/L (ref 135–145)

## 2018-01-28 LAB — ECHOCARDIOGRAM COMPLETE
Height: 62 in
WEIGHTICAEL: 4195.2 [oz_av]

## 2018-01-28 LAB — TROPONIN I

## 2018-01-28 MED ORDER — TECHNETIUM TC 99M DIETHYLENETRIAME-PENTAACETIC ACID
28.0000 | Freq: Once | INTRAVENOUS | Status: AC | PRN
  Administered 2018-01-28: 28 via RESPIRATORY_TRACT

## 2018-01-28 MED ORDER — TECHNETIUM TO 99M ALBUMIN AGGREGATED
3.8000 | Freq: Once | INTRAVENOUS | Status: AC | PRN
  Administered 2018-01-28: 3.8 via INTRAVENOUS

## 2018-01-28 MED ORDER — TRAZODONE HCL 50 MG PO TABS
50.0000 mg | ORAL_TABLET | Freq: Every evening | ORAL | Status: DC | PRN
  Administered 2018-01-28 – 2018-01-29 (×2): 50 mg via ORAL
  Filled 2018-01-28 (×2): qty 1

## 2018-01-28 NOTE — ED Notes (Signed)
Patient to be transport to the floor after shift change-Monique,RN

## 2018-01-28 NOTE — Progress Notes (Signed)
Patient ID: Melissa Bartlett, female   DOB: [birth year], 72 y.o.   MRN: [id number]  PROGRESS NOTE    Melissa Bartlett  ERX:[id number] DOB: [birth year] DOA: 01/27/2018 PCP: Donald Prose, MD   Brief Narrative: 72 year old female with history of hypertension, diastolic heart failure last EF 65-70% followed by Dr. Marlou Porch, permanent A. fib on Xarelto s/p DCCV in 07/2016, RA, CKD stage IV, OSA on CPAP   presented with complaints of extreme fatigue.  She has been seen by her nephrologist Dr. Virginia Crews on 01/27/18 for regular appointment and her O2 saturations were noted to be around 76% on room air for which she was placed on nasal cannula oxygen. She was admitted with CHF exacerbation and started on iv lasix. Cardiology consult has been requested.       Assessment & Plan:   Principal Problem:   Acute respiratory failure with hypoxia (HCC) Active Problems:   Diabetes (HCC)   Essential hypertension   Morbid obesity (HCC)   CKD (chronic kidney disease) stage 4, GFR 15-29 ml/min (HCC)   OSA (obstructive sleep apnea)   Permanent atrial fibrillation (HCC)   RA (rheumatoid arthritis) (HCC)   Acute diastolic CHF (congestive heart failure) (HCC)  Hypoxia probably due to diastolic CHF exacerbation - continue oxygen supplementation. - V/Q scan - continue diuresis   Acute on chronic diastolic CHF  - continue iv lasix. Strict input and output. Daily weights.  Last EF noted to be 65-70% by echocardiogram in 02/2017.  - Follow echo - Cardiology evaluation is pending.  Peripheral edema: Patient noted to have bilateral lower extremity swelling suspect secondary to above  - Bilateral lower extremities duplex  Atrial fibrillation on anticoagulation: chronic. Cha2DS2VASc is 4.  Patient s/p DCCV in 07/2016, but presents in atrial fibrillation currently rate controlled.  Reports taking Xarelto as advised.  - Continue Xarelto for now, might need to discuss need of transition to alternative anticoagulant if  renal function worsens  Essential hypertension - Continue Coreg, amlodipine and lasix   Diabetes mellitus type 2:  diet controlled.  Last hemoglobin A1c noted to be 5.6.  - Heart healthy and carb modified diet  Morbid obesity: BMI 47.6 - Continue to encourage weight loss  Hyperlipidemia  - continue simvastatin  Rheumatoid arthritis - Continue leflunomide and prednisone.  Chronic kidney disease stage IV: - Closely monitor kidney function with diuresis - Consider nephrology evaluation if renal function worsens.  Chronic anemia:  - Hemoglobin stable - Continue to monitor  OSA on CPAP - continue CPAP   DVT prophylaxis: Xarelto Code Status: Full Family Communication: none present Disposition Plan: depends on clinical outcome  Consultants: Cardiology  Procedures: None  Antimicrobials: None    Subjective: Patient seen and examined at bedside. She feels slightly better but is still short of breath. No overnight fever or vomiting.  Objective: Vitals:   01/28/18 0545 01/28/18 0600 01/28/18 0800 01/28/18 0812  BP: (!) 150/86 133/85 (!) 142/87   Pulse: 80 73 91   Resp: 13 12 (!) 23   Temp:   97.9 F (36.6 C)   TempSrc:   Oral   SpO2: 94% 92% 96%   Weight:    118.9 kg (262 lb 3.2 oz)  Height:    5\' 2"  (1.575 m)    Intake/Output Summary (Last 24 hours) at 01/28/2018 1127 Last data filed at 01/28/2018 1024 Gross per 24 hour  Intake 120 ml  Output 600 ml  Net -480 ml   Filed Weights   01/28/18 9509  Weight: 118.9 kg (262 lb 3.2 oz)    Examination:  General exam: Appears calm and comfortable  Respiratory system: Bilateral decreased breath sound at bases with basilar crackles Cardiovascular system: S1 & S2 heard, rate controlled Gastrointestinal system: Abdomen is morbidly obese, nondistended, soft and nontender. Normal bowel sounds heard. Extremities: No cyanosis, clubbing; 1-2 +edema   Data Reviewed: I have personally reviewed following labs and  imaging studies  CBC: Recent Labs  Lab 01/27/18 1429 01/28/18 0428  WBC 9.7 10.8*  NEUTROABS  --  7.7  HGB 10.2* 10.6*  HCT 35.5* 37.2  MCV 89.9 89.2  PLT 222 921   Basic Metabolic Panel: Recent Labs  Lab 01/27/18 1429 01/28/18 0428  NA 142 143  K 4.7 4.6  CL 107 106  CO2 24 24  GLUCOSE 113* 96  BUN 28* 31*  CREATININE 2.82* 2.90*  CALCIUM 7.4* 7.3*   GFR: Estimated Creatinine Clearance: 21.8 mL/min (A) (by C-G formula based on SCr of 2.9 mg/dL (H)). Liver Function Tests: No results for input(s): AST, ALT, ALKPHOS, BILITOT, PROT, ALBUMIN in the last 168 hours. No results for input(s): LIPASE, AMYLASE in the last 168 hours. No results for input(s): AMMONIA in the last 168 hours. Coagulation Profile: No results for input(s): INR, PROTIME in the last 168 hours. Cardiac Enzymes: Recent Labs  Lab 01/28/18 0429  TROPONINI <0.03   BNP (last 3 results) No results for input(s): PROBNP in the last 8760 hours. HbA1C: No results for input(s): HGBA1C in the last 72 hours. CBG: No results for input(s): GLUCAP in the last 168 hours. Lipid Profile: No results for input(s): CHOL, HDL, LDLCALC, TRIG, CHOLHDL, LDLDIRECT in the last 72 hours. Thyroid Function Tests: Recent Labs    01/27/18 1846  TSH 1.919   Anemia Panel: No results for input(s): VITAMINB12, FOLATE, FERRITIN, TIBC, IRON, RETICCTPCT in the last 72 hours. Sepsis Labs: No results for input(s): PROCALCITON, LATICACIDVEN in the last 168 hours.  No results found for this or any previous visit (from the past 240 hour(s)).       Radiology Studies: Dg Chest 2 View  Result Date: 01/27/2018 CLINICAL DATA:  Shortness of Breath EXAM: CHEST  2 VIEW COMPARISON:  10/27/2017 FINDINGS: Cardiomegaly. Small right effusion including fluid in the fissures. Right base opacity likely reflects atelectasis. No confluent opacity on the left. No overt edema. IMPRESSION: Right pleural effusion including fissural fluid. Right base  atelectasis. Cardiomegaly. Electronically Signed   By: Rolm Baptise M.D.   On: 01/27/2018 16:15        Scheduled Meds: . amLODipine  10 mg Oral Daily  . brimonidine  1 drop Both Eyes BID  . calcium-vitamin D  1 tablet Oral BID  . carvedilol  25 mg Oral BID WC  . furosemide  40 mg Intravenous BID  . leflunomide  10 mg Oral Daily  . pantoprazole  40 mg Oral Daily  . potassium chloride SA  20 mEq Oral Daily  . predniSONE  5 mg Oral Daily  . Rivaroxaban  15 mg Oral Q supper  . simvastatin  20 mg Oral QPM  . sodium chloride flush  3 mL Intravenous Q12H  . timolol  1 drop Both Eyes BID   Continuous Infusions: . sodium chloride       LOS: 1 day        Aline August, MD [facility] Pager [phone]  If 7PM-7AM, please contact night-coverage [email or web address] Password [facility] 01/28/2018, 11:27 AM

## 2018-01-28 NOTE — Progress Notes (Signed)
Patient arrived to 4E room 1 from the ED.  Telemetry monitor applied and CCMD notified.  Patient oriented to unit and room to include call light and phone.  Will continue to monitor.

## 2018-01-28 NOTE — Progress Notes (Signed)
Placed patient on home CPAP with oxygen set at 1lpm.

## 2018-01-28 NOTE — Progress Notes (Signed)
  Echocardiogram 2D Echocardiogram has been performed.  Jennette Dubin 01/28/2018, 3:59 PM

## 2018-01-28 NOTE — Consult Note (Signed)
Cardiology Consultation:   Patient ID: [name]; [id number]; 1946-04-20   Admit date: 01/27/2018 Date of Consult: 01/28/2018  Primary Care Provider: Donald Prose, MD Primary Cardiologist: Dr. Candee Furbish Primary Electrophysiologist:  None   Patient Profile:   Melissa Bartlett is a 72 y.o. female with PMH of chronic diastolic CHF, HTN, permanent atrial fibrillation on xarelto (s/p failed DCCV 07/2016), RA, CKD stage IV, OSA on CPAP, who is being seen today for the evaluation of acute on chronic diastolic heart failure at the request of Dr. Starla Link.  History of Present Illness:   Melissa Bartlett is a 72 y.o. female with PMH of chronic diastolic CHF, HTN, permanent atrial fibrillation on xarelto (s/p failed DCCV 07/2016), RA, CKD stage IV, OSA on CPAP who presents with with complaints of worsening fatigue and LE swelling over the past 3 weeks. On 01/27/18 she presented to her nephrologists office and was found to have an O2 saturation of 76%, at which time she was placed on O2 via [place] and instructed to present to the ED. States she has been having trouble with fatigue and LE swelling for months, however they have been noticeably worse over the past several weeks. She states her LE swelling has made it difficult for her to walk and complete her ADLs. She notes a 10lb weight gain in the past 2 weeks. She denies any overt SOB or DOE, states her primary limiting factor has been fatigue. She denies CP, dizziness, lightheadedness, syncope, fever, abdominal pain, bleeding, diarrhea/constipation. She states she has been compliant with her medications including lasix and xarelto.   Patient was last seen by cardiology 01/14/18 and noted to have been non-compliant with her home lasix (patient states she has been compliant, taking 40mg  daily since 09/2017). At this visit, she was instructed to take 80mg  01/14/18, followed by 40mg  daily going forward.   Hospital course: O2 sat 70% on RA at arrival; improved to  96% with O2 via [place]; HR stable; BP mildly elevated. Labs notable for Hgb 10.6 (baseline), Electrolytes wnl, Cr 2.9 (baseline 2.4-2.7), Ca 7.4 (albumin 2.8 11/2017), BNP 739.5, Troponin negative x2, Ddimer 1.63. EKG with atrial fibrillation with normal rate, low voltage. CXR with small R pleural effusion. Patient was admitted to medicine and started on IV lasix 40mg  BID. Cardiology consulted for assistance with acute on chronic diastolic CHF.   Past Medical History:  Diagnosis Date  . CHF (congestive heart failure) ([place])   . CKD (chronic kidney disease), stage IV ([place])   . Complication of anesthesia   . Diabetes ([place])   . HTN (hypertension)   . OSA (obstructive sleep apnea)    with AHI 11/hr with nocturnal hypoxemia   . PAF (paroxysmal atrial fibrillation) ([place])    s/p DCCV in 8/17  . RA (rheumatoid arthritis) ([place])     Past Surgical History:  Procedure Laterality Date  . ANKLE FUSION Right   . BREAST BIOPSY    . CARDIOVERSION N/A 08/15/2016   Procedure: CARDIOVERSION;  Surgeon: Jerline Pain, MD;  Location: [facility] ENDOSCOPY;  Service: Cardiovascular;  Laterality: N/A;  . CHOLECYSTECTOMY    . GALLBLADDER SURGERY    . HYSTEROTOMY     Patrtial  . HYSTEROTOMY     Complete  . KNEE ARTHROSCOPY    . LEFT HEART CATH AND CORONARY ANGIOGRAPHY N/A 11/16/2017   Procedure: LEFT HEART CATH AND CORONARY ANGIOGRAPHY;  Surgeon: Martinique, Peter M, MD;  Location: [facility] CV LAB;  Service: Cardiovascular;  Laterality:  N/A;     Home Medications:  Prior to Admission medications   Medication Sig Start Date End Date Taking? Authorizing Provider  amLODipine (NORVASC) 10 MG tablet TAKE 1 TABLET BY MOUTH EVERY DAY Patient taking differently: TAKE 10 mgTABLET BY MOUTH EVERY DAY 10/02/17  Yes Jerline Pain, MD  Calcium Carbonate-Vitamin D3 (CALCIUM 600/VITAMIN D) 600-400 MG-UNIT TABS Take 1 tablet by mouth 2 (two) times daily.   Yes [provider]  carvedilol (COREG) 25 MG tablet TAKE 1 TABLET BY  MOUTH TWICE A DAY WITH A MEAL Patient taking differently: TAKE 25 mg TABLET BY MOUTH TWICE A DAY WITH A MEAL 09/03/17  Yes Skains, Thana Farr, MD  COMBIGAN 0.2-0.5 % ophthalmic solution Place 1 drop into both eyes 2 (two) times daily.  01/29/14  Yes [provider]  Darbepoetin Alfa (ARANESP) 60 MCG/0.3ML SOSY injection Inject 60 mcg every 30 (thirty) days into the skin.   Yes [provider]  diphenhydramine-acetaminophen (TYLENOL PM) 25-500 MG TABS tablet Take 1 tablet by mouth at bedtime.    Yes [provider]  furosemide (LASIX) 40 MG tablet Take 1 tablet (40 mg total) by mouth daily. 10/15/17  Yes Jerline Pain, MD  KLOR-CON M20 20 MEQ tablet TAKE 1 TABLET BY MOUTH EVERY DAY Patient taking differently: TAKE 20 meq TABLET BY MOUTH EVERY DAY 10/02/17  Yes Jerline Pain, MD  leflunomide (ARAVA) 10 MG tablet Take 10 mg by mouth daily.    Yes [provider]  pantoprazole (PROTONIX) 40 MG tablet Take 1 tablet (40 mg total) by mouth daily. 10/29/17  Yes Vann, Jessica U, DO  predniSONE (DELTASONE) 5 MG tablet Take 5 mg by mouth daily.  08/22/17  Yes [provider]  Probiotic Product (ALIGN PO) Take 1 tablet by mouth at bedtime.    Yes [provider]  Rivaroxaban (XARELTO) 15 MG TABS tablet Take 1 tablet (15 mg total) daily with supper by mouth. 11/17/17  Yes Martinique, Peter M, MD  simvastatin (ZOCOR) 20 MG tablet Take 20 mg by mouth every evening. 06/21/16  Yes [provider]  vitamin B-12 (CYANOCOBALAMIN) 1000 MCG tablet Take 1,000 mcg by mouth daily.   Yes [provider]    Inpatient Medications: Scheduled Meds: . amLODipine  10 mg Oral Daily  . brimonidine  1 drop Both Eyes BID  . calcium-vitamin D  1 tablet Oral BID  . carvedilol  25 mg Oral BID WC  . furosemide  40 mg Intravenous BID  . leflunomide  10 mg Oral Daily  . pantoprazole  40 mg Oral Daily  . potassium chloride SA  20 mEq Oral Daily  . predniSONE  5 mg Oral Daily   . Rivaroxaban  15 mg Oral Q supper  . simvastatin  20 mg Oral QPM  . sodium chloride flush  3 mL Intravenous Q12H  . timolol  1 drop Both Eyes BID   Continuous Infusions: . sodium chloride     PRN Meds: sodium chloride, acetaminophen, ipratropium-albuterol, ondansetron (ZOFRAN) IV, sodium chloride flush  Allergies:    Allergies  Allergen Reactions  . Enalapril Other (See Comments)    Systemic reaction - jerking  . Codeine Itching  . Oxycodone Itching  . Vicodin [Hydrocodone-Acetaminophen] Itching    Social History:   Social History   Socioeconomic History  . Marital status: Married    Spouse name: Not on file  . Number of children: Not on file  . Years of education: Not  on file  . Highest education level: Not on file  Social Needs  . Financial resource strain: Not on file  . Food insecurity - worry: Not on file  . Food insecurity - inability: Not on file  . Transportation needs - medical: Not on file  . Transportation needs - non-medical: Not on file  Occupational History  . Not on file  Tobacco Use  . Smoking status: Former Smoker    Last attempt to quit: 04/08/2016    Years since quitting: 1.8  . Smokeless tobacco: Never Used  Substance and Sexual Activity  . Alcohol use: No    Alcohol/week: 0.0 oz  . Drug use: No  . Sexual activity: Not on file  Other Topics Concern  . Not on file  Social History Narrative  . Not on file    Family History:    Family History  Problem Relation Age of Onset  . Hypertension Mother        Family HX Diabetes  . Stomach cancer Father      ROS:  Please see the history of present illness.   All other ROS reviewed and negative.     Physical Exam/Data:   Vitals:   01/28/18 0545 01/28/18 0600 01/28/18 0800 01/28/18 0812  BP: (!) 150/86 133/85 (!) 142/87   Pulse: 80 73 91   Resp: 13 12 (!) 23   Temp:   97.9 F (36.6 C)   TempSrc:   Oral   SpO2: 94% 92% 96%   Weight:    262 lb 3.2 oz (118.9 kg)  Height:    5\' 2"   (1.575 m)    Intake/Output Summary (Last 24 hours) at 01/28/2018 1010 Last data filed at 01/28/2018 0800 Gross per 24 hour  Intake 120 ml  Output 300 ml  Net -180 ml   Filed Weights   01/28/18 0812  Weight: 262 lb 3.2 oz (118.9 kg)   Body mass index is 47.96 kg/m.  General:  Well nourished, well developed, obese AAF, laying in bed in no acute distress HEENT: sclera anicteric  Neck: no JVD Vascular: distal pulses 2+ bilaterally Cardiac:  IRRR; no murmur, gallops, or rubs Lungs:  clear to auscultation bilaterally, no wheezing, rhonchi or rales  Abd: NABS, soft, nontender, no hepatomegaly Ext: 1-2+ edema b/l Musculoskeletal:  No deformities, BUE and BLE strength normal and equal Skin: warm and dry  Neuro:  CNs 2-12 intact, no focal abnormalities noted Psych:  Normal affect   EKG:  The EKG was personally reviewed and demonstrates:  Atrial fibrillation, rate 87. Low voltage Telemetry:  Telemetry was personally reviewed and demonstrates:  Atrial fibrillation with stable rate in the 80s on average  Relevant CV Studies: Echocardiogram 02/2017: Study Conclusions  - Left ventricle: The cavity size was normal. There was mild   concentric hypertrophy. Systolic function was vigorous. The   estimated ejection fraction was in the range of 65% to 70%. Wall   motion was normal; there were no regional wall motion   abnormalities. Doppler parameters are consistent with high   ventricular filling pressure. - Aortic valve: There was mild regurgitation. - Mitral valve: There was trivial regurgitation. Valve area by   continuity equation (using LVOT flow): 2.49 cm^2. - Left atrium: The atrium was moderately dilated. - Pulmonary arteries: Systolic pressure could not be accurately   estimated.  Left heart catheterization 10/2017: Conclusion     LV end diastolic pressure is mildly elevated.   1. Normal coronary angiography 2. Elevated  LVEDP  Plan: medical therapy.       Laboratory Data:  Chemistry Recent Labs  Lab 01/27/18 1429 01/28/18 0428  NA 142 143  K 4.7 4.6  CL 107 106  CO2 24 24  GLUCOSE 113* 96  BUN 28* 31*  CREATININE 2.82* 2.90*  CALCIUM 7.4* 7.3*  GFRNONAA 16* 15*  GFRAA 18* 18*  ANIONGAP 11 13    No results for input(s): PROT, ALBUMIN, AST, ALT, ALKPHOS, BILITOT in the last 168 hours. Hematology Recent Labs  Lab 01/27/18 1429 01/28/18 0428  WBC 9.7 10.8*  RBC 3.95 4.17  HGB 10.2* 10.6*  HCT 35.5* 37.2  MCV 89.9 89.2  MCH 25.8* 25.4*  MCHC 28.7* 28.5*  RDW 18.6* 18.2*  PLT 222 214   Cardiac Enzymes Recent Labs  Lab 01/28/18 0429  TROPONINI <0.03    Recent Labs  Lab 01/27/18 1443  TROPIPOC 0.00    BNP Recent Labs  Lab 01/27/18 1429  BNP 739.5*    DDimer  Recent Labs  Lab 01/27/18 1846  DDIMER 1.63*    Radiology/Studies:  Dg Chest 2 View  Result Date: 01/27/2018 CLINICAL DATA:  Shortness of Breath EXAM: CHEST  2 VIEW COMPARISON:  10/27/2017 FINDINGS: Cardiomegaly. Small right effusion including fluid in the fissures. Right base opacity likely reflects atelectasis. No confluent opacity on the left. No overt edema. IMPRESSION: Right pleural effusion including fissural fluid. Right base atelectasis. Cardiomegaly. Electronically Signed   By: Rolm Baptise M.D.   On: 01/27/2018 16:15    Assessment and Plan:   1. Acute on chronic diastolic heart failure: she reports worsening fatigue over the past 2-3 weeks with associated LE swelling and weight gain (~10lbs in the past 2 weeks). Last echo 02/2017 with EF 65-70% with high ventricular filling pressures. Last seen by cardiology 01/14/18 and reported non-compliance with home lasix (although, patient states she has been compliant with 40mg  daily since 09/2017). Instructed to take 80mg  01/14/18, followed by 40mg  daily going forward. At her nephrology appt 01/27/18 she was found to be hypoxic (O2 76% on RA) and was sent to ED for further management.  - BNP  739.5 - CXR with small R pleural effusion without overt pulmonary edema.  - I&Os with -47mL this AM - Educated on importance of fluid restriction to 1.5L and maintaining a low salt diet - Agree with IV diuretics - continue 40mg  BID - Echo pending  2. Elevated Ddimer: unlikely embolic event given therapeutic anticoagulation with xarelto. Patient reports she has not missed any doses. Explains RLE typically more swollen since R ankle fusion. Could be multifactorial in setting of acute on chronic CHF and CKD.   3. Permanent atrial fibrillation: s/p failed DCCV 07/2016. HR stable this admission. - CHA2DS2-VASc Score and unadjusted Ischemic Stroke Rate (% per year) is equal to 4.8 % stroke rate/year from a score of 4 Above score calculated as 1 point each if present [CHF, HTN, DM, Vascular=MI/PAD/Aortic Plaque, Age if 65-74, or Female] Above score calculated as 2 points each if present [Age > 75, or Stroke/TIA/TE] - Continue xarelto - Continue home coreg  4. HTN: BP mildly elevated this admission - Continue home amlodipine and coreg  5. CKD stabe IV:  Baseline Cr 2.4-2.7 - Cr. 2.90 today - anticipate mild elevation with IV diuretics.   6. DM type 2: diet controlled - Continue management per primary team   7. RA: - Continue management per primary team  8. HLD:  - Continue simvastatin  9. OSA -  Continue CPAP   For questions or updates, please contact [place] Please consult [email or web address] for contact info under Cardiology/STEMI.   Signed, Abigail Butts, PA-C  01/28/2018 10:10 AM [phone]  As above, patient seen and examined.  Briefly she is a 72 year old female with past medical history of diastolic congestive heart failure, hypertension, permanent atrial fibrillation, chronic stage IV kidney disease, sleep apnea for evaluation of acute on chronic diastolic congestive heart failure.  Echocardiogram March 2018 showed normal LV function, mild aortic insufficiency and left  atrial enlargement.  Cardiac catheterization November 2018 showed normal coronary arteries and elevated left ventricular end-diastolic pressure.  Patient complains of fatigue with exertion for approximately 2 weeks.  She denies dyspnea, chest pain or palpitations.  She has gained 10 pounds and has noticed worsening lower extremity edema right greater than left.  She was at an office visit with nephrology yesterday and saturations were low.  She was admitted.  Cardiology now asked to evaluate. Electrocardiogram shows atrial fibrillation and prior septal infarct cannot be excluded.  Chest x-ray shows right pleural effusion and cardiomegaly.  Creatinine 2.9 and BUN 31.  BNP 739.  1 acute on chronic diastolic congestive heart failure-patient is volume overloaded on examination.  Continue Lasix 40 mg IV twice daily.  Follow renal function.  Patient instructed on importance of low-sodium diet and fluid restriction.    2 permanent atrial fibrillation-continue carvedilol for rate control.  Continue Xarelto at 15 mg daily as GFR 31.  3 chronic stage IV kidney disease-follow renal function closely with diuresis.  4 hypertension-blood pressure is mildly elevated.  We will continue preadmission medications and follow.  Increase as needed.  Kirk Ruths, MD

## 2018-01-28 NOTE — Progress Notes (Signed)
BLE venous duplex prelim: negative for DVT. Melissa Bartlett Eunice, RDMS, RVT  

## 2018-01-28 NOTE — Progress Notes (Addendum)
PT Cancellation Note  Patient Details Name: KRISHA BEEGLE MRN: [id number] DOB: 1946-09-07   Cancelled Treatment:    Reason Eval/Treat Not Completed: Patient at procedure or test/unavailable Pt going for [place] of LEs to check for DVT. Will await results and follow up as schedule allows.   Leighton Ruff, PT, DPT  Acute Rehabilitation Services  Pager: [phone]  Rudean Hitt 01/28/2018, 2:14 PM

## 2018-01-29 LAB — BASIC METABOLIC PANEL
Anion gap: 12 (ref 5–15)
BUN: 33 mg/dL — AB (ref 6–20)
CALCIUM: 7.8 mg/dL — AB (ref 8.9–10.3)
CHLORIDE: 104 mmol/L (ref 101–111)
CO2: 26 mmol/L (ref 22–32)
CREATININE: 2.83 mg/dL — AB (ref 0.44–1.00)
GFR calc non Af Amer: 16 mL/min — ABNORMAL LOW (ref >60)
GFR, EST AFRICAN AMERICAN: 18 mL/min — AB (ref >60)
Glucose, Bld: 79 mg/dL (ref 65–99)
Potassium: 4.2 mmol/L (ref 3.5–5.1)
SODIUM: 142 mmol/L (ref 135–145)

## 2018-01-29 LAB — CBC WITH DIFFERENTIAL/PLATELET
BASOS PCT: 0 %
Basophils Absolute: 0 10*3/uL (ref 0.0–0.1)
EOS ABS: 0.1 10*3/uL (ref 0.0–0.7)
EOS PCT: 1 %
HCT: 35.5 % — ABNORMAL LOW (ref 36.0–46.0)
Hemoglobin: 10.1 g/dL — ABNORMAL LOW (ref 12.0–15.0)
LYMPHS ABS: 1.8 10*3/uL (ref 0.7–4.0)
Lymphocytes Relative: 19 %
MCH: 25.3 pg — AB (ref 26.0–34.0)
MCHC: 28.5 g/dL — AB (ref 30.0–36.0)
MCV: 89 fL (ref 78.0–100.0)
MONOS PCT: 8 %
Monocytes Absolute: 0.7 10*3/uL (ref 0.1–1.0)
NEUTROS PCT: 72 %
Neutro Abs: 7 10*3/uL (ref 1.7–7.7)
PLATELETS: 214 10*3/uL (ref 150–400)
RBC: 3.99 MIL/uL (ref 3.87–5.11)
RDW: 18.1 % — AB (ref 11.5–15.5)
WBC: 9.6 10*3/uL (ref 4.0–10.5)

## 2018-01-29 LAB — URINALYSIS, ROUTINE W REFLEX MICROSCOPIC
Bilirubin Urine: NEGATIVE
GLUCOSE, UA: NEGATIVE mg/dL
Ketones, ur: NEGATIVE mg/dL
Nitrite: NEGATIVE
PH: 6 (ref 5.0–8.0)
PROTEIN: 100 mg/dL — AB
SPECIFIC GRAVITY, URINE: 1.009 (ref 1.005–1.030)

## 2018-01-29 LAB — MAGNESIUM: MAGNESIUM: 1.3 mg/dL — AB (ref 1.7–2.4)

## 2018-01-29 MED ORDER — HYDRALAZINE HCL 25 MG PO TABS
25.0000 mg | ORAL_TABLET | Freq: Three times a day (TID) | ORAL | Status: DC
  Administered 2018-01-29 – 2018-01-30 (×3): 25 mg via ORAL
  Filled 2018-01-29 (×3): qty 1

## 2018-01-29 NOTE — Care Management Note (Signed)
Case Management Note Marvetta Gibbons RN, BSN Unit 4E-Case Manager [phone]  Patient Details  Name: NERIA PROCTER MRN: [id number] Date of Birth: Aug 19, 1946  Subjective/Objective:  Pt admitted with Acute Resp. Failure secondary to HF                  Action/Plan: PTA pt lived at home with spouse- per PT eval no f/u recommendations made- CM to follow for transition of care needs.   Expected Discharge Date:                  Expected Discharge Plan:  Home/Self Care  In-House Referral:     Discharge planning Services  CM Consult  Post Acute Care Choice:    Choice offered to:     DME Arranged:    DME Agency:     HH Arranged:    HH Agency:     Status of Service:  In process, will continue to follow  If discussed at Long Length of Stay Meetings, dates discussed:    Discharge Disposition:   Additional Comments:  Dawayne Patricia, RN 01/29/2018, 2:32 PM

## 2018-01-29 NOTE — Evaluation (Addendum)
Physical Therapy Evaluation Patient Details Name: Melissa Bartlett MRN: [id number] DOB: 10-Jan-1946 Today's Date: 01/29/2018   History of Present Illness  Pt adm with acute hypoxic respiratory failure due to acute on chronic diastolic heart failure. PMH - diastolic heart failure. PMH - diastolic heart failure, HTN, obesity, afib, Rheumatoid Arthritis  Clinical Impression  Pt doing well with mobility and no further PT needed. Pt amb on RA with SpO2 > 94%. Several standing rest breaks due to dyspnea 2-3/4 and fatigue. Instructed pt to amb in halls 2-3x/day. Also instructed pt to stay active at home after DC with frequent, short periods of activity.      Follow Up Recommendations No PT follow up    Equipment Recommendations  None recommended by PT    Recommendations for Other Services       Precautions / Restrictions Precautions Precautions: None Restrictions Weight Bearing Restrictions: No      Mobility  Bed Mobility               General bed mobility comments: Pt sitting EOB  Transfers Overall transfer level: Independent                  Ambulation/Gait Ambulation/Gait assistance: Modified independent (Device/Increase time) Ambulation Distance (Feet): 450 Feet Assistive device: None Gait Pattern/deviations: Step-through pattern;Decreased stride length;Wide base of support Gait velocity: decr Gait velocity interpretation: Below normal speed for age/gender General Gait Details: Steady gait with 3-4 standing rest breaks. Pt amb on RA with SpO2 >94% and dyspnea 2-3/4.  Stairs            Wheelchair Mobility    Modified Rankin (Stroke Patients Only)       Balance Overall balance assessment: No apparent balance deficits (not formally assessed)                                           Pertinent Vitals/Pain Pain Assessment: No/denies pain    Home Living Family/patient expects to be discharged to:: Private residence Living  Arrangements: Spouse/significant other Available Help at Discharge: Family Type of Home: House Home Access: Stairs to enter Entrance Stairs-Rails: Right Entrance Stairs-Number of Steps: 2-3 Home Layout: One level Home Equipment: None      Prior Function Level of Independence: Independent               Hand Dominance        Extremity/Trunk Assessment   Upper Extremity Assessment Upper Extremity Assessment: Overall WFL for tasks assessed    Lower Extremity Assessment Lower Extremity Assessment: Overall WFL for tasks assessed       Communication   Communication: No difficulties  Cognition Arousal/Alertness: Awake/alert Behavior During Therapy: WFL for tasks assessed/performed Overall Cognitive Status: Within Functional Limits for tasks assessed                                        General Comments      Exercises     Assessment/Plan    PT Assessment Patent does not need any further PT services  PT Problem List         PT Treatment Interventions      PT Goals (Current goals can be found in the Care Plan section)  Acute Rehab PT Goals PT Goal Formulation: All assessment and  education complete, DC therapy    Frequency     Barriers to discharge        Co-evaluation               AM-PAC PT "6 Clicks" Daily Activity  Outcome Measure Difficulty turning over in bed (including adjusting bedclothes, sheets and blankets)?: None Difficulty moving from lying on back to sitting on the side of the bed? : None Difficulty sitting down on and standing up from a chair with arms (e.g., wheelchair, bedside commode, etc,.)?: None Help needed moving to and from a bed to chair (including a wheelchair)?: None Help needed walking in hospital room?: None Help needed climbing 3-5 steps with a railing? : None 6 Click Score: 24    End of Session   Activity Tolerance: Patient limited by fatigue Patient left: in bed;with call bell/phone within  reach;with family/visitor present(sitting EOB) Nurse Communication: Mobility status;Other (comment)(O2 left off ) PT Visit Diagnosis: Other abnormalities of gait and mobility (R26.89)    Time: 1350-1408 PT Time Calculation (min) (ACUTE ONLY): 18 min   Charges:   PT Evaluation $PT Eval Low Complexity: 1 Low     PT G CodesMarland Kitchen        [facility] PT Nina 01/29/2018, 2:26 PM

## 2018-01-29 NOTE — Progress Notes (Signed)
Progress Note  Patient Name: Melissa Bartlett Date of Encounter: 01/29/2018  Primary Cardiologist: Candee Furbish, MD   Subjective   No chest pain or dyspnea  Inpatient Medications    Scheduled Meds: . amLODipine  10 mg Oral Daily  . brimonidine  1 drop Both Eyes BID  . calcium-vitamin D  1 tablet Oral BID  . carvedilol  25 mg Oral BID WC  . furosemide  40 mg Intravenous BID  . leflunomide  10 mg Oral Daily  . pantoprazole  40 mg Oral Daily  . potassium chloride SA  20 mEq Oral Daily  . predniSONE  5 mg Oral Daily  . Rivaroxaban  15 mg Oral Q supper  . simvastatin  20 mg Oral QPM  . sodium chloride flush  3 mL Intravenous Q12H  . timolol  1 drop Both Eyes BID   Continuous Infusions: . sodium chloride     PRN Meds: sodium chloride, acetaminophen, ipratropium-albuterol, ondansetron (ZOFRAN) IV, sodium chloride flush, traZODone   Vital Signs    Vitals:   01/28/18 1351 01/28/18 2030 01/29/18 0241 01/29/18 0343  BP: (!) 140/98 140/82  (!) 158/94  Pulse: 73 94 91 74  Resp: 18 (!) 21 20 (!) 26  Temp: 98.3 F (36.8 C) 98.1 F (36.7 C)  (!) 97.4 F (36.3 C)  TempSrc: Oral Oral  Axillary  SpO2: 98% 94% (!) 84% 98%  Weight:   256 lb 14.4 oz (116.5 kg)   Height:        Intake/Output Summary (Last 24 hours) at 01/29/2018 0937 Last data filed at 01/29/2018 4536 Gross per 24 hour  Intake 720 ml  Output 3600 ml  Net -2880 ml   Filed Weights   01/28/18 0812 01/29/18 0241  Weight: 262 lb 3.2 oz (118.9 kg) 256 lb 14.4 oz (116.5 kg)    Telemetry    Atrial fibrillation, rate controlled- Personally Reviewed   Physical Exam   GEN: obese No acute distress.   Neck: supple Cardiac: RRR, no murmurs, rubs, or gallops.  Respiratory: Clear to auscultation bilaterally. GI: Soft, nontender, non-distended  MS:  1+ edema Neuro:  Nonfocal  Psych: Normal affect   Labs    Chemistry Recent Labs  Lab 01/27/18 1429 01/28/18 0428 01/29/18 0213  NA 142 143 142  K 4.7 4.6 4.2    CL 107 106 104  CO2 24 24 26   GLUCOSE 113* 96 79  BUN 28* 31* 33*  CREATININE 2.82* 2.90* 2.83*  CALCIUM 7.4* 7.3* 7.8*  GFRNONAA 16* 15* 16*  GFRAA 18* 18* 18*  ANIONGAP 11 13 12      Hematology Recent Labs  Lab 01/27/18 1429 01/28/18 0428 01/29/18 0213  WBC 9.7 10.8* 9.6  RBC 3.95 4.17 3.99  HGB 10.2* 10.6* 10.1*  HCT 35.5* 37.2 35.5*  MCV 89.9 89.2 89.0  MCH 25.8* 25.4* 25.3*  MCHC 28.7* 28.5* 28.5*  RDW 18.6* 18.2* 18.1*  PLT 222 214 214    Cardiac Enzymes Recent Labs  Lab 01/28/18 0429  TROPONINI <0.03    Recent Labs  Lab 01/27/18 1443  TROPIPOC 0.00     BNP Recent Labs  Lab 01/27/18 1429  BNP 739.5*     DDimer  Recent Labs  Lab 01/27/18 1846  DDIMER 1.63*     Radiology    Dg Chest 2 View  Result Date: 01/27/2018 CLINICAL DATA:  Shortness of Breath EXAM: CHEST  2 VIEW COMPARISON:  10/27/2017 FINDINGS: Cardiomegaly. Small right effusion including fluid in  the fissures. Right base opacity likely reflects atelectasis. No confluent opacity on the left. No overt edema. IMPRESSION: Right pleural effusion including fissural fluid. Right base atelectasis. Cardiomegaly. Electronically Signed   By: Rolm Baptise M.D.   On: 01/27/2018 16:15   Nm Pulmonary Perf And Vent  Result Date: 01/28/2018 CLINICAL DATA:  Elevated D-dimer, fatigue for 3 weeks, shortness of breath for 1 week question pulmonary embolism EXAM: NUCLEAR MEDICINE VENTILATION - PERFUSION LUNG SCAN TECHNIQUE: Ventilation images were obtained in multiple projections using inhaled aerosol Tc-38m DTPA. Perfusion images were obtained in multiple projections after intravenous injection of Tc-31m MAA. RADIOPHARMACEUTICALS:  28.0 mCi of Tc-91m DTPA aerosol inhalation ID to another scan due and 3.8 mCi Tc19m MAA-IV COMPARISON:  None Correlation: Chest radiograph 01/27/2017 FINDINGS: Ventilation: Enlargement of cardiac silhouette. Focal ventilation defect in the RIGHT mid lung corresponding to fluid in the  fissures on chest radiograph. Diminished ventilation at LEFT apex. Cardiac silhouette appears enlarged. Remaining ventilation normal. Perfusion: Matching perfusion defects in the RIGHT lung at the major and minor fissures from fluid in the fissures. Cardiac silhouette appears enlarged. No additional segmental or subsegmental perfusion defects identified. IMPRESSION: Matching ventilation and perfusion defects in the RIGHT lung at the major and minor fissures corresponding to the fluid in the fissures on chest radiograph. Low probability for pulmonary embolism. Electronically Signed   By: Lavonia Dana M.D.   On: 01/28/2018 15:54    Patient Profile       72 year old female with past medical history of diastolic congestive heart failure, hypertension, permanent atrial fibrillation, chronic stage IV kidney disease, sleep apnea for evaluation of acute on chronic diastolic congestive heart failure.  Cardiac catheterization November 2018 showed normal coronary arteries and elevated left ventricular end-diastolic pressure. Echo shows normal LV function, mild AI, mild to moderate MR, mild biatrial enlargement.  Assessment & Plan    1 acute on chronic diastolic congestive heart failure-I/O -3060. Wt 256. Patient remains volume overloaded on examination.  Continue Lasix 40 mg IV twice daily.  Follow renal function.  Patient instructed on importance of low-sodium diet and fluid restriction.    2 permanent atrial fibrillation-rate controlled; continue coreg.  Continue Xarelto.  3 chronic stage IV kidney disease-follow renal function closely with diuresis.  4 hypertension-blood pressure is mildly elevated.  Add hydralazine 25 mg tid and follow.    For questions or updates, please contact [place] Please consult [email or web address] for contact info under Cardiology/STEMI.      Signed, Kirk Ruths, MD  01/29/2018, 9:37 AM

## 2018-01-29 NOTE — Progress Notes (Signed)
Patient ID: Melissa Bartlett, female   DOB: [birth year], 72 y.o.   MRN: [id number]  PROGRESS NOTE    LESHA JAGER  CZY:[id number] DOB: Feb 14, 1946 DOA: 01/27/2018 PCP: Donald Prose, MD   Brief Narrative: 72 year old female with history of hypertension, diastolic heart failure last EF 65-70%followed by Dr. Eulah Citizen. fib on Xarelto s/p DCCV in 07/2016, RA,CKD stage IV, OSA on CPAP  presented with complaints of extreme fatigue. She has been seen by her nephrologist Dr. Netta Cedars 01/27/18 for regular appointment and her O2 saturations were noted to be around 76% on room air for which she was placed on nasal cannula oxygen. She was admitted with CHF exacerbation and started on iv lasix.  Cardiology consulted.  VQ scan was done January 31 which was low probability for pulmonary emboli.     Assessment & Plan:   Principal Problem:   Acute respiratory failure with hypoxia (HCC)-secondary to congestive heart failure exacerbation.  Continue diuresis.  Patient clinically improving.  Oxygen as tolerates.  Active Problems:   Acute diastolic CHF (congestive heart failure) (HCC)-echo done January 28, 2017 shows EF of 55% patient improving with Lasix 40 mg IV every 12 hours.  Ambulate patient in hallways on room air and measure oxygen sats to see if she desats.    Diabetes (HCC)-diet controlled    Essential hypertension-continue Coreg and amlodipine and Lasix    Morbid obesity (HCC)-noted    CKD (chronic kidney disease) stage 4, GFR 15-29 ml/min (HCC)-renal function remaining stable with increase in diuretics.    OSA (obstructive sleep apnea)-CPAP nightly    Permanent atrial fibrillation (HCC)-continue Coreg as above.  Has been rate controlled.  Continue Xarelto    RA (rheumatoid arthritis) (HCC)-continue low-dose prednisone orally     DVT prophylaxis: Xarelto  code Status: Full  family Communication: None  disposition Plan: When okay with cardiology.  Consultants:    Cardiology   Subjective: Patient feeling better today and is less short of breath.  She is wanting to go home.  Objective: Vitals:   01/29/18 1000 01/29/18 1100 01/29/18 1200 01/29/18 1246  BP:    105/86  Pulse: 87 86 (!) 103 84  Resp:    (!) 27  Temp:    98.2 F (36.8 C)  TempSrc:    Oral  SpO2: 100% 100% 100% 100%  Weight:      Height:        Intake/Output Summary (Last 24 hours) at 01/29/2018 1338 Last data filed at 01/29/2018 1246 Gross per 24 hour  Intake 843 ml  Output 3300 ml  Net -2457 ml   Filed Weights   01/28/18 0812 01/29/18 0241  Weight: 118.9 kg (262 lb 3.2 oz) 116.5 kg (256 lb 14.4 oz)    Examination:  General exam: Appears calm and comfortable  Respiratory system: Clear to auscultation. Respiratory effort normal. Cardiovascular system: S1 & S2 heard, RRR. No JVD, murmurs, rubs, gallops or clicks. No pedal edema. Gastrointestinal system: Abdomen is nondistended, soft and nontender. No organomegaly or masses felt. Normal bowel sounds heard. Central nervous system: Alert and oriented. No focal neurological deficits. Extremities: Symmetric 5 x 5 power. Skin: No rashes, lesions or ulcers Psychiatry: Judgement and insight appear normal. Mood & affect appropriate.     Data Reviewed: I have personally reviewed following labs and imaging studies  CBC: Recent Labs  Lab 01/27/18 1429 01/28/18 0428 01/29/18 0213  WBC 9.7 10.8* 9.6  NEUTROABS  --  7.7 7.0  HGB 10.2* 10.6* 10.1*  HCT 35.5* 37.2 35.5*  MCV 89.9 89.2 89.0  PLT 222 214 417   Basic Metabolic Panel: Recent Labs  Lab 01/27/18 1429 01/28/18 0428 01/29/18 0213  NA 142 143 142  K 4.7 4.6 4.2  CL 107 106 104  CO2 24 24 26   GLUCOSE 113* 96 79  BUN 28* 31* 33*  CREATININE 2.82* 2.90* 2.83*  CALCIUM 7.4* 7.3* 7.8*  MG  --   --  1.3*   GFR: Estimated Creatinine Clearance: 22.1 mL/min (A) (by C-G formula based on SCr of 2.83 mg/dL (H)). Liver Function Tests: No results for input(s):  AST, ALT, ALKPHOS, BILITOT, PROT, ALBUMIN in the last 168 hours. No results for input(s): LIPASE, AMYLASE in the last 168 hours. No results for input(s): AMMONIA in the last 168 hours. Coagulation Profile: No results for input(s): INR, PROTIME in the last 168 hours. Cardiac Enzymes: Recent Labs  Lab 01/28/18 0429  TROPONINI <0.03   BNP (last 3 results) No results for input(s): PROBNP in the last 8760 hours. HbA1C: No results for input(s): HGBA1C in the last 72 hours. CBG: No results for input(s): GLUCAP in the last 168 hours. Lipid Profile: No results for input(s): CHOL, HDL, LDLCALC, TRIG, CHOLHDL, LDLDIRECT in the last 72 hours. Thyroid Function Tests: Recent Labs    01/27/18 1846  TSH 1.919   Anemia Panel: No results for input(s): VITAMINB12, FOLATE, FERRITIN, TIBC, IRON, RETICCTPCT in the last 72 hours. Sepsis Labs: No results for input(s): PROCALCITON, LATICACIDVEN in the last 168 hours.  No results found for this or any previous visit (from the past 240 hour(s)).       Radiology Studies: Dg Chest 2 View  Result Date: 01/27/2018 CLINICAL DATA:  Shortness of Breath EXAM: CHEST  2 VIEW COMPARISON:  10/27/2017 FINDINGS: Cardiomegaly. Small right effusion including fluid in the fissures. Right base opacity likely reflects atelectasis. No confluent opacity on the left. No overt edema. IMPRESSION: Right pleural effusion including fissural fluid. Right base atelectasis. Cardiomegaly. Electronically Signed   By: Rolm Baptise M.D.   On: 01/27/2018 16:15   Nm Pulmonary Perf And Vent  Result Date: 01/28/2018 CLINICAL DATA:  Elevated D-dimer, fatigue for 3 weeks, shortness of breath for 1 week question pulmonary embolism EXAM: NUCLEAR MEDICINE VENTILATION - PERFUSION LUNG SCAN TECHNIQUE: Ventilation images were obtained in multiple projections using inhaled aerosol Tc-59m DTPA. Perfusion images were obtained in multiple projections after intravenous injection of Tc-72m MAA.  RADIOPHARMACEUTICALS:  28.0 mCi of Tc-9m DTPA aerosol inhalation ID to another scan due and 3.8 mCi Tc21m MAA-IV COMPARISON:  None Correlation: Chest radiograph 01/27/2017 FINDINGS: Ventilation: Enlargement of cardiac silhouette. Focal ventilation defect in the RIGHT mid lung corresponding to fluid in the fissures on chest radiograph. Diminished ventilation at LEFT apex. Cardiac silhouette appears enlarged. Remaining ventilation normal. Perfusion: Matching perfusion defects in the RIGHT lung at the major and minor fissures from fluid in the fissures. Cardiac silhouette appears enlarged. No additional segmental or subsegmental perfusion defects identified. IMPRESSION: Matching ventilation and perfusion defects in the RIGHT lung at the major and minor fissures corresponding to the fluid in the fissures on chest radiograph. Low probability for pulmonary embolism. Electronically Signed   By: Lavonia Dana M.D.   On: 01/28/2018 15:54        Scheduled Meds: . amLODipine  10 mg Oral Daily  . brimonidine  1 drop Both Eyes BID  . calcium-vitamin D  1 tablet Oral BID  . carvedilol  25 mg Oral BID WC  .  furosemide  40 mg Intravenous BID  . hydrALAZINE  25 mg Oral Q8H  . leflunomide  10 mg Oral Daily  . pantoprazole  40 mg Oral Daily  . potassium chloride SA  20 mEq Oral Daily  . predniSONE  5 mg Oral Daily  . Rivaroxaban  15 mg Oral Q supper  . simvastatin  20 mg Oral QPM  . sodium chloride flush  3 mL Intravenous Q12H  . timolol  1 drop Both Eyes BID   Continuous Infusions: . sodium chloride       LOS: 2 days    Time spent: 25 minutes    Jesalyn Finazzo A, MD [facility] Pager [phone]  If 7PM-7AM, please contact night-coverage [email or web address] Password [email or web address] 01/29/2018, 1:38 PM

## 2018-01-30 ENCOUNTER — Other Ambulatory Visit: Payer: Self-pay | Admitting: Nurse Practitioner

## 2018-01-30 DIAGNOSIS — I5031 Acute diastolic (congestive) heart failure: Secondary | ICD-10-CM

## 2018-01-30 DIAGNOSIS — N184 Chronic kidney disease, stage 4 (severe): Secondary | ICD-10-CM

## 2018-01-30 DIAGNOSIS — I5030 Unspecified diastolic (congestive) heart failure: Secondary | ICD-10-CM

## 2018-01-30 DIAGNOSIS — J9601 Acute respiratory failure with hypoxia: Secondary | ICD-10-CM

## 2018-01-30 LAB — CBC
HCT: 35.6 % — ABNORMAL LOW (ref 36.0–46.0)
HEMOGLOBIN: 10.4 g/dL — AB (ref 12.0–15.0)
MCH: 25.7 pg — AB (ref 26.0–34.0)
MCHC: 29.2 g/dL — AB (ref 30.0–36.0)
MCV: 88.1 fL (ref 78.0–100.0)
PLATELETS: 216 10*3/uL (ref 150–400)
RBC: 4.04 MIL/uL (ref 3.87–5.11)
RDW: 18.3 % — ABNORMAL HIGH (ref 11.5–15.5)
WBC: 10.6 10*3/uL — ABNORMAL HIGH (ref 4.0–10.5)

## 2018-01-30 LAB — BASIC METABOLIC PANEL
ANION GAP: 13 (ref 5–15)
BUN: 40 mg/dL — ABNORMAL HIGH (ref 6–20)
CALCIUM: 7.9 mg/dL — AB (ref 8.9–10.3)
CO2: 28 mmol/L (ref 22–32)
CREATININE: 2.98 mg/dL — AB (ref 0.44–1.00)
Chloride: 102 mmol/L (ref 101–111)
GFR, EST AFRICAN AMERICAN: 17 mL/min — AB (ref >60)
GFR, EST NON AFRICAN AMERICAN: 15 mL/min — AB (ref >60)
GLUCOSE: 105 mg/dL — AB (ref 65–99)
Potassium: 4 mmol/L (ref 3.5–5.1)
Sodium: 143 mmol/L (ref 135–145)

## 2018-01-30 MED ORDER — HYDRALAZINE HCL 25 MG PO TABS: 25.0000 mg | ORAL_TABLET | Freq: Three times a day (TID) | ORAL | 1 refills | Status: DC

## 2018-01-30 MED ORDER — FUROSEMIDE 40 MG PO TABS: 40.0000 mg | ORAL_TABLET | Freq: Two times a day (BID) | ORAL | 1 refills | Status: DC

## 2018-01-30 NOTE — Progress Notes (Signed)
Discharge instructions reviewed with patient.She verbalized understanding. Prescription given to patient. All belongings sent home with patient.

## 2018-01-30 NOTE — Care Management Note (Signed)
Case Management Note  Patient Details  Name: Melissa Bartlett MRN: [id number] Date of Birth: 1946-07-13  Subjective/Objective:       Pt presented for HF.  Pt from home with husband.  Pt reports there are no barriers to medications/care and does not need equipment or home health.           Action/Plan: No CM needs at this time.   Expected Discharge Date:  01/30/18               Expected Discharge Plan:  Home/Self Care  In-House Referral:     Discharge planning Services  CM Consult  Post Acute Care Choice:    Choice offered to:     DME Arranged:    DME Agency:     HH Arranged:    HH Agency:     Status of Service:  Completed, signed off  If discussed at [employer] of Stay Meetings, dates discussed:    Additional Comments:  Arley Phenix, RN 01/30/2018, 11:43 AM

## 2018-01-30 NOTE — Discharge Summary (Signed)
Physician Discharge Summary  Melissa Bartlett ZOX:[id number] DOB: 12-12-1946 DOA: 01/27/2018  PCP: Donald Prose, MD  Admit date: 01/27/2018 Discharge date: 01/30/2018  Time spent: 40 minutes   Discharge Diagnoses:  Principal Problem:   Acute respiratory failure with hypoxia [facility] Active Problems:   Acute diastolic CHF (congestive heart failure) (HCC)   Diabetes (HCC)   Essential hypertension   Morbid obesity (HCC)   CKD (chronic kidney disease) stage 4, GFR 15-29 ml/min (HCC)   OSA (obstructive sleep apnea)   Permanent atrial fibrillation (HCC)   RA (rheumatoid arthritis) ([place])   Discharge Condition: Stable and improved  Diet recommendation: Healthy heart low-sodium diet  Filed Weights   01/28/18 0812 01/29/18 0241 01/30/18 0358  Weight: 118.9 kg (262 lb 3.2 oz) 116.5 kg (256 lb 14.4 oz) 115 kg (253 lb 9.6 oz)     Hospital Course:  This is a pleasant 72 year old female who was admitted for acute diastolic congestive heart failure and chronic disease.  She was hypoxic in the mid 80s on room air on arrival.  She has been aggressively diuresed and has had market improvement since admission.  Her renal function has remained stable at a creatinine baseline of about 2.7.  She has been getting Lasix IV 40 mg every 12 hours and has had significant weight loss with this.  Patient is going to be discharged to take Lasix 40 mg p.o. twice a day.  She is also been started on hydralazine for hypertension.  Patient is back to her baseline status.  She has been weaned off oxygen and has been ambulating in the hallways not requiring any supplemental oxygen.  She is being discharged home to follow-up with primary care physician in 1 week.  Echo here shows normal LV function with mild aortic insufficiency mild to moderate MR with mild biatrial enlargement.  She is educated on CHF diet and salt intake restrictions along with fluid restrictions.   Discharge Exam: Vitals:   01/30/18 0358 01/30/18 0548   BP: (!) 159/109 (!) 152/84  Pulse: 94 88  Resp: 19 18  Temp: 97.6 F (36.4 C)   SpO2: 98% 98%    General: Alert and oriented x4 no apparent distress Cardiovascular: Regular rate and rhythm without murmurs rubs or gallops Respiratory: Clear to auscultation bilateral no wheezes rhonchi rales  Discharge Instructions   Discharge Instructions    Diet - low sodium heart healthy   Complete by:  As directed    Increase activity slowly   Complete by:  As directed      Allergies as of 01/30/2018      Reactions   Enalapril Other (See Comments)   Systemic reaction - jerking   Codeine Itching   Oxycodone Itching   Vicodin [hydrocodone-acetaminophen] Itching      Medication List    TAKE these medications   ALIGN PO Take 1 tablet by mouth at bedtime.   amLODipine 10 MG tablet Commonly known as:  NORVASC TAKE 1 TABLET BY MOUTH EVERY DAY What changed:    how much to take  how to take this  when to take this   CALCIUM 600/VITAMIN D 600-400 MG-UNIT Tabs Generic drug:  Calcium Carbonate-Vitamin D3 Take 1 tablet by mouth 2 (two) times daily.   carvedilol 25 MG tablet Commonly known as:  COREG TAKE 1 TABLET BY MOUTH TWICE A DAY WITH A MEAL What changed:  See the new instructions.   COMBIGAN 0.2-0.5 % ophthalmic solution Generic drug:  brimonidine-timolol Place 1 drop  into both eyes 2 (two) times daily.   Darbepoetin Alfa 60 MCG/0.3ML Sosy injection Commonly known as:  ARANESP Inject 60 mcg every 30 (thirty) days into the skin.   diphenhydramine-acetaminophen 25-500 MG Tabs tablet Commonly known as:  TYLENOL PM Take 1 tablet by mouth at bedtime.   furosemide 40 MG tablet Commonly known as:  LASIX Take 1 tablet (40 mg total) by mouth 2 (two) times daily. What changed:  when to take this   hydrALAZINE 25 MG tablet Commonly known as:  APRESOLINE Take 1 tablet (25 mg total) by mouth 3 (three) times daily.   KLOR-CON M20 20 MEQ tablet Generic drug:  potassium  chloride SA TAKE 1 TABLET BY MOUTH EVERY DAY What changed:    how much to take  how to take this  when to take this   leflunomide 10 MG tablet Commonly known as:  ARAVA Take 10 mg by mouth daily.   pantoprazole 40 MG tablet Commonly known as:  PROTONIX Take 1 tablet (40 mg total) by mouth daily.   predniSONE 5 MG tablet Commonly known as:  DELTASONE Take 5 mg by mouth daily.   Rivaroxaban 15 MG Tabs tablet Commonly known as:  XARELTO Take 1 tablet (15 mg total) daily with supper by mouth.   simvastatin 20 MG tablet Commonly known as:  ZOCOR Take 20 mg by mouth every evening.   vitamin B-12 1000 MCG tablet Commonly known as:  CYANOCOBALAMIN Take 1,000 mcg by mouth daily.      Allergies  Allergen Reactions  . Enalapril Other (See Comments)    Systemic reaction - jerking  . Codeine Itching  . Oxycodone Itching  . Vicodin [Hydrocodone-Acetaminophen] Itching   Follow-up Information    Isaiah Serge, NP Follow up on 02/11/2018.   Specialties:  Cardiology, Radiology Why:  10:30 AM Contact information: [place] [phone]            The results of significant diagnostics from this hospitalization (including imaging, microbiology, ancillary and laboratory) are listed below for reference.    Significant Diagnostic Studies: Dg Chest 2 View  Result Date: 01/27/2018 CLINICAL DATA:  Shortness of Breath EXAM: CHEST  2 VIEW COMPARISON:  10/27/2017 FINDINGS: Cardiomegaly. Small right effusion including fluid in the fissures. Right base opacity likely reflects atelectasis. No confluent opacity on the left. No overt edema. IMPRESSION: Right pleural effusion including fissural fluid. Right base atelectasis. Cardiomegaly. Electronically Signed   By: Rolm Baptise M.D.   On: 01/27/2018 16:15   Nm Pulmonary Perf And Vent  Result Date: 01/28/2018 CLINICAL DATA:  Elevated D-dimer, fatigue for 3 weeks, shortness of breath for 1 week  question pulmonary embolism EXAM: NUCLEAR MEDICINE VENTILATION - PERFUSION LUNG SCAN TECHNIQUE: Ventilation images were obtained in multiple projections using inhaled aerosol Tc-25m DTPA. Perfusion images were obtained in multiple projections after intravenous injection of Tc-55m MAA. RADIOPHARMACEUTICALS:  28.0 mCi of Tc-16m DTPA aerosol inhalation ID to another scan due and 3.8 mCi Tc29m MAA-IV COMPARISON:  None Correlation: Chest radiograph 01/27/2017 FINDINGS: Ventilation: Enlargement of cardiac silhouette. Focal ventilation defect in the RIGHT mid lung corresponding to fluid in the fissures on chest radiograph. Diminished ventilation at LEFT apex. Cardiac silhouette appears enlarged. Remaining ventilation normal. Perfusion: Matching perfusion defects in the RIGHT lung at the major and minor fissures from fluid in the fissures. Cardiac silhouette appears enlarged. No additional segmental or subsegmental perfusion defects identified. IMPRESSION: Matching ventilation and perfusion defects in the  RIGHT lung at the major and minor fissures corresponding to the fluid in the fissures on chest radiograph. Low probability for pulmonary embolism. Electronically Signed   By: Lavonia Dana M.D.   On: 01/28/2018 15:54    Microbiology: No results found for this or any previous visit (from the past 240 hour(s)).   Labs: Basic Metabolic Panel: Recent Labs  Lab 01/27/18 1429 01/28/18 0428 01/29/18 0213 01/30/18 0200  NA 142 143 142 143  K 4.7 4.6 4.2 4.0  CL 107 106 104 102  CO2 24 24 26 28   GLUCOSE 113* 96 79 105*  BUN 28* 31* 33* 40*  CREATININE 2.82* 2.90* 2.83* 2.98*  CALCIUM 7.4* 7.3* 7.8* 7.9*  MG  --   --  1.3*  --    Liver Function Tests: No results for input(s): AST, ALT, ALKPHOS, BILITOT, PROT, ALBUMIN in the last 168 hours. No results for input(s): LIPASE, AMYLASE in the last 168 hours. No results for input(s): AMMONIA in the last 168 hours. CBC: Recent Labs  Lab 01/27/18 1429  01/28/18 0428 01/29/18 0213 01/30/18 0200  WBC 9.7 10.8* 9.6 10.6*  NEUTROABS  --  7.7 7.0  --   HGB 10.2* 10.6* 10.1* 10.4*  HCT 35.5* 37.2 35.5* 35.6*  MCV 89.9 89.2 89.0 88.1  PLT 222 214 214 216   Cardiac Enzymes: Recent Labs  Lab 01/28/18 0429  TROPONINI <0.03   BNP: BNP (last 3 results) Recent Labs    03/14/17 0933 01/27/18 1429  BNP 435.7* 739.5*    ProBNP (last 3 results) No results for input(s): PROBNP in the last 8760 hours.  CBG: No results for input(s): GLUCAP in the last 168 hours.     Signed:  Phillips Grout MD.  [facility] 01/30/2018, 10:14 AM

## 2018-01-30 NOTE — Progress Notes (Signed)
Progress Note  Patient Name: Melissa Bartlett Date of Encounter: 01/30/2018  Primary Cardiologist:   Candee Furbish, MD   Subjective   She feels close to baseline.  Breathing much better.  No pain.    Inpatient Medications    Scheduled Meds: . amLODipine  10 mg Oral Daily  . brimonidine  1 drop Both Eyes BID  . calcium-vitamin D  1 tablet Oral BID  . carvedilol  25 mg Oral BID WC  . furosemide  40 mg Intravenous BID  . hydrALAZINE  25 mg Oral Q8H  . leflunomide  10 mg Oral Daily  . pantoprazole  40 mg Oral Daily  . potassium chloride SA  20 mEq Oral Daily  . predniSONE  5 mg Oral Daily  . Rivaroxaban  15 mg Oral Q supper  . simvastatin  20 mg Oral QPM  . sodium chloride flush  3 mL Intravenous Q12H  . timolol  1 drop Both Eyes BID   Continuous Infusions: . sodium chloride     PRN Meds: sodium chloride, acetaminophen, ipratropium-albuterol, ondansetron (ZOFRAN) IV, sodium chloride flush, traZODone   Vital Signs    Vitals:   01/29/18 1500 01/29/18 1921 01/30/18 0358 01/30/18 0548  BP:  (!) 147/94 (!) 159/109 (!) 152/84  Pulse: 86 (!) 102 94 88  Resp: (!) 22 18 19 18   Temp:  97.6 F (36.4 C) 97.6 F (36.4 C)   TempSrc:  Oral Oral   SpO2: 95% 100% 98% 98%  Weight:   253 lb 9.6 oz (115 kg)   Height:        Intake/Output Summary (Last 24 hours) at 01/30/2018 0908 Last data filed at 01/30/2018 0551 Gross per 24 hour  Intake 483 ml  Output 1850 ml  Net -1367 ml   Filed Weights   01/28/18 0812 01/29/18 0241 01/30/18 0358  Weight: 262 lb 3.2 oz (118.9 kg) 256 lb 14.4 oz (116.5 kg) 253 lb 9.6 oz (115 kg)    Telemetry    Atrial fib - Personally Reviewed  ECG    NA - Personally Reviewed  Physical Exam   GEN: No acute distress.   Neck: No  JVD Cardiac: Irregular RR, no murmurs, rubs, or gallops.  Respiratory: Clear  to auscultation bilaterally. GI: Soft, nontender, non-distended  MS:   Mild  edema; No deformity. Neuro:  Nonfocal  Psych: Normal affect    Labs    Chemistry Recent Labs  Lab 01/28/18 0428 01/29/18 0213 01/30/18 0200  NA 143 142 143  K 4.6 4.2 4.0  CL 106 104 102  CO2 24 26 28   GLUCOSE 96 79 105*  BUN 31* 33* 40*  CREATININE 2.90* 2.83* 2.98*  CALCIUM 7.3* 7.8* 7.9*  GFRNONAA 15* 16* 15*  GFRAA 18* 18* 17*  ANIONGAP 13 12 13      Hematology Recent Labs  Lab 01/28/18 0428 01/29/18 0213 01/30/18 0200  WBC 10.8* 9.6 10.6*  RBC 4.17 3.99 4.04  HGB 10.6* 10.1* 10.4*  HCT 37.2 35.5* 35.6*  MCV 89.2 89.0 88.1  MCH 25.4* 25.3* 25.7*  MCHC 28.5* 28.5* 29.2*  RDW 18.2* 18.1* 18.3*  PLT 214 214 216    Cardiac Enzymes Recent Labs  Lab 01/28/18 0429  TROPONINI <0.03    Recent Labs  Lab 01/27/18 1443  TROPIPOC 0.00     BNP Recent Labs  Lab 01/27/18 1429  BNP 739.5*     DDimer  Recent Labs  Lab 01/27/18 1846  DDIMER 1.63*  Radiology    Nm Pulmonary Perf And Vent  Result Date: 01/28/2018 CLINICAL DATA:  Elevated D-dimer, fatigue for 3 weeks, shortness of breath for 1 week question pulmonary embolism EXAM: NUCLEAR MEDICINE VENTILATION - PERFUSION LUNG SCAN TECHNIQUE: Ventilation images were obtained in multiple projections using inhaled aerosol Tc-36m DTPA. Perfusion images were obtained in multiple projections after intravenous injection of Tc-14m MAA. RADIOPHARMACEUTICALS:  28.0 mCi of Tc-64m DTPA aerosol inhalation ID to another scan due and 3.8 mCi Tc26m MAA-IV COMPARISON:  None Correlation: Chest radiograph 01/27/2017 FINDINGS: Ventilation: Enlargement of cardiac silhouette. Focal ventilation defect in the RIGHT mid lung corresponding to fluid in the fissures on chest radiograph. Diminished ventilation at LEFT apex. Cardiac silhouette appears enlarged. Remaining ventilation normal. Perfusion: Matching perfusion defects in the RIGHT lung at the major and minor fissures from fluid in the fissures. Cardiac silhouette appears enlarged. No additional segmental or subsegmental perfusion defects  identified. IMPRESSION: Matching ventilation and perfusion defects in the RIGHT lung at the major and minor fissures corresponding to the fluid in the fissures on chest radiograph. Low probability for pulmonary embolism. Electronically Signed   By: Lavonia Dana M.D.   On: 01/28/2018 15:54    Cardiac Studies   Echo  01/28/18  Study Conclusions  - Left ventricle: The cavity size was normal. Wall thickness was   increased in a pattern of mild LVH. The estimated ejection   fraction was 55%. The study is not technically sufficient to   allow evaluation of LV diastolic function. - Aortic valve: There was mild regurgitation. - Mitral valve: There was mild to moderate regurgitation. - Left atrium: The atrium was mildly dilated. - Right atrium: The atrium was mildly dilated. - Pulmonary arteries: PA peak pressure: 40 mm Hg (S).   Patient Profile     71 y.o. female with past medical history of diastolic congestive heart failure, hypertension, permanent atrial fibrillation, chronic stage IV kidney disease, sleep apnea for evaluation of acute on chronic diastolic congestive heart failure. Cardiac catheterization November 2018 showed normal coronary arteries and elevated left ventricular end-diastolic pressure. Echo shows normal LV function, mild AI, mild to moderate MR, mild biatrial enlargement.   Assessment & Plan    ACUTE ON CHRONIC DIASTOLIC HF:   Down 1.4 liters yesterday.  Weight is down about 3 lbs since yesterday.   She can go home on Lasix 40 mg bid but we will need to follow her creat closely.  I will ask for a TOC appt with follow up labs within the week.   She understands salt and fluid restriction.    ATRIAL FIB:  Continue Xarelto and Coreg.  Rate is controlled.  Continue current therapy.   CKD;   Creat is about at baseline.  We talked about the need to follow this closely.    HTN:  BP slightly increased.  Hydralazine was added and we can continue to follow today with this  adjustment.    For questions or updates, please contact [place] Please consult [email or web address] for contact info under Cardiology/STEMI.   Signed, Minus Breeding, MD  01/30/2018, 9:08 AM

## 2018-02-01 DIAGNOSIS — Z79899 Other long term (current) drug therapy: Secondary | ICD-10-CM | POA: Diagnosis not present

## 2018-02-01 DIAGNOSIS — M0579 Rheumatoid arthritis with rheumatoid factor of multiple sites without organ or systems involvement: Secondary | ICD-10-CM | POA: Diagnosis not present

## 2018-02-01 DIAGNOSIS — M2550 Pain in unspecified joint: Secondary | ICD-10-CM | POA: Diagnosis not present

## 2018-02-01 DIAGNOSIS — E790 Hyperuricemia without signs of inflammatory arthritis and tophaceous disease: Secondary | ICD-10-CM | POA: Diagnosis not present

## 2018-02-01 DIAGNOSIS — M150 Primary generalized (osteo)arthritis: Secondary | ICD-10-CM | POA: Diagnosis not present

## 2018-02-01 DIAGNOSIS — Z6841 Body Mass Index (BMI) 40.0 and over, adult: Secondary | ICD-10-CM | POA: Diagnosis not present

## 2018-02-03 DIAGNOSIS — E1122 Type 2 diabetes mellitus with diabetic chronic kidney disease: Secondary | ICD-10-CM | POA: Diagnosis not present

## 2018-02-03 DIAGNOSIS — N390 Urinary tract infection, site not specified: Secondary | ICD-10-CM | POA: Diagnosis not present

## 2018-02-03 DIAGNOSIS — I5030 Unspecified diastolic (congestive) heart failure: Secondary | ICD-10-CM | POA: Diagnosis not present

## 2018-02-03 DIAGNOSIS — I110 Hypertensive heart disease with heart failure: Secondary | ICD-10-CM | POA: Diagnosis not present

## 2018-02-03 DIAGNOSIS — I480 Paroxysmal atrial fibrillation: Secondary | ICD-10-CM | POA: Diagnosis not present

## 2018-02-10 ENCOUNTER — Encounter (HOSPITAL_COMMUNITY)
Admission: RE | Admit: 2018-02-10 | Discharge: 2018-02-10 | Disposition: A | Discharge status: Home / Self Care | Payer: Medicare Other | Source: Ambulatory Visit | Attending: Nephrology | Admitting: Nephrology

## 2018-02-10 VITALS — BP 124/74 | HR 77 | Temp 98.0°F | Resp 16

## 2018-02-10 DIAGNOSIS — N184 Chronic kidney disease, stage 4 (severe): Secondary | ICD-10-CM

## 2018-02-10 DIAGNOSIS — D631 Anemia in chronic kidney disease: Secondary | ICD-10-CM | POA: Insufficient documentation

## 2018-02-10 DIAGNOSIS — N179 Acute kidney failure, unspecified: Secondary | ICD-10-CM | POA: Insufficient documentation

## 2018-02-10 LAB — COMPREHENSIVE METABOLIC PANEL
ALT: 14 U/L (ref 14–54)
ANION GAP: 14 (ref 5–15)
AST: 19 U/L (ref 15–41)
Albumin: 3.3 g/dL — ABNORMAL LOW (ref 3.5–5.0)
Alkaline Phosphatase: 59 U/L (ref 38–126)
BUN: 36 mg/dL — ABNORMAL HIGH (ref 6–20)
CALCIUM: 8 mg/dL — AB (ref 8.9–10.3)
CHLORIDE: 106 mmol/L (ref 101–111)
CO2: 20 mmol/L — AB (ref 22–32)
CREATININE: 3.47 mg/dL — AB (ref 0.44–1.00)
GFR, EST AFRICAN AMERICAN: 14 mL/min — AB (ref >60)
GFR, EST NON AFRICAN AMERICAN: 12 mL/min — AB (ref >60)
Glucose, Bld: 144 mg/dL — ABNORMAL HIGH (ref 65–99)
Potassium: 4.2 mmol/L (ref 3.5–5.1)
SODIUM: 140 mmol/L (ref 135–145)
Total Bilirubin: 0.5 mg/dL (ref 0.3–1.2)
Total Protein: 6.5 g/dL (ref 6.5–8.1)

## 2018-02-10 LAB — CBC
HCT: 36.8 % (ref 36.0–46.0)
Hemoglobin: 10.9 g/dL — ABNORMAL LOW (ref 12.0–15.0)
MCH: 26.3 pg (ref 26.0–34.0)
MCHC: 29.6 g/dL — AB (ref 30.0–36.0)
MCV: 88.7 fL (ref 78.0–100.0)
PLATELETS: 179 10*3/uL (ref 150–400)
RBC: 4.15 MIL/uL (ref 3.87–5.11)
RDW: 17.4 % — AB (ref 11.5–15.5)
WBC: 9.4 10*3/uL (ref 4.0–10.5)

## 2018-02-10 MED ORDER — DARBEPOETIN ALFA 60 MCG/0.3ML IJ SOSY
PREFILLED_SYRINGE | INTRAMUSCULAR | Status: AC
Start: 2018-02-10 — End: 2018-02-10
  Filled 2018-02-10: qty 0.3

## 2018-02-10 MED ORDER — DARBEPOETIN ALFA 60 MCG/0.3ML IJ SOSY
60.0000 ug | PREFILLED_SYRINGE | INTRAMUSCULAR | Status: DC
  Administered 2018-02-10: 11:00:00 60 ug via SUBCUTANEOUS

## 2018-02-10 NOTE — Progress Notes (Signed)
Cardiology Office Note:    Date:  02/11/2018   ID:  Melissa Bartlett, DOB 08-06-1946, MRN [id number]  PCP:  Donald Prose, MD  Cardiologist:  Candee Furbish, MD  Referring MD: Donald Prose, MD   Chief Complaint  Patient presents with  . Hospitalization Follow-up    Diastolic CHF    History of Present Illness:    Melissa Bartlett is a 72 y.o. female with a past medical history significant for difficult to control hypertension, DM, OSA on CPAP, PAF s/p DCCV, Now permanent Afib on Xarelto, CKD stage IV, RA.   In Oct admitted for chest pain that was reproducible.  Neg troponins. --stress test was done and not normal, possible ant defect so underwent cath and found to have normal coronary angiography and elevated LVEDP. 11/16/17. Echo in March 2018 showed normal LV function, mild aortic insufficiency and left atrial enlargement.    She was last seen in the office on 01/14/18 for post cath f/u and had significant edema but no dyspnea. She ad not been taking her lasix. Post cath labs had no change in Cr. She was instructed to take lasix 80 mg on that day and then 40 mg daily with K+ supplement.   On 01/27/18 Ms. Melissa Bartlett presented to the hospital after being found to have O2 saturation of 76% at her nephrologist office. She had complaints of worsening fatigue and lower extremity swelling. She had been taking lasix as instructed. BNP was 739.5, CXR with small right pleural effusion. She was in afib, controlled rate. She was treated for acute on chronic diastolic heart failure. She was diuresed with IV lasix with improved breathing and edema and wt down 9 lbs. Discharge wt 253 lbs. BP was mildly elevated and hydralazine was added. She was discharged on lasix 40 mg BID.   An echo on 01/28/18 showed EF 55%, mild LVH, mild AR, mod MR, mildly dilated LA and RA, peak PA pressure 40 mm Hg.  Ms. Melissa Bartlett is here today for close follow up and labs with her husband. Pt had been exteremly tired with increased LE edema  when she presented to the hospital. She does not think that she was particularly short of breath and had no increase in her chronic 2 pillow orthopnea. Today she feels better with more energy and can move around more now. Pt weighs daily with stable wts. She has had lower extremity edema for as long as she can remember. The edema had gotten to the point of inhibiting her walking. Currently she says that it is her norm and her walking is normal.   No palpitations, fluttering, lightheadedness or syncope. No chest pain, pressure or tightness. She denies DOE. She feels that she is back to her baseline.     Past Medical History:  Diagnosis Date  . CHF (congestive heart failure) ([place])   . CKD (chronic kidney disease), stage IV ([place])   . Complication of anesthesia   . Diabetes ([place])   . HTN (hypertension)   . OSA (obstructive sleep apnea)    with AHI 11/hr with nocturnal hypoxemia   . PAF (paroxysmal atrial fibrillation) ([place])    s/p DCCV in 8/17  . RA (rheumatoid arthritis) ([place])     Past Surgical History:  Procedure Laterality Date  . ANKLE FUSION Right   . BREAST BIOPSY    . CARDIOVERSION N/A 08/15/2016   Procedure: CARDIOVERSION;  Surgeon: Jerline Pain, MD;  Location: [facility];  Service: Cardiovascular;  Laterality: N/A;  .  CHOLECYSTECTOMY    . GALLBLADDER SURGERY    . HYSTEROTOMY     Patrtial  . HYSTEROTOMY     Complete  . KNEE ARTHROSCOPY    . LEFT HEART CATH AND CORONARY ANGIOGRAPHY N/A 11/16/2017   Procedure: LEFT HEART CATH AND CORONARY ANGIOGRAPHY;  Surgeon: Martinique, Peter M, MD;  Location: [facility];  Service: Cardiovascular;  Laterality: N/A;    Current Medications: Current Meds  Medication Sig  . amLODipine (NORVASC) 10 MG tablet TAKE 1 TABLET BY MOUTH EVERY DAY (Patient taking differently: TAKE 10 mgTABLET BY MOUTH EVERY DAY)  . Calcium Carbonate-Vitamin D3 (CALCIUM 600/VITAMIN D) 600-400 MG-UNIT TABS Take 1 tablet by mouth 2 (two) times daily.  .  carvedilol (COREG) 25 MG tablet TAKE 1 TABLET BY MOUTH TWICE A DAY WITH A MEAL (Patient taking differently: TAKE 25 mg TABLET BY MOUTH TWICE A DAY WITH A MEAL)  . COMBIGAN 0.2-0.5 % ophthalmic solution Place 1 drop into both eyes 2 (two) times daily.   . Darbepoetin Alfa (ARANESP) 60 MCG/0.3ML SOSY injection Inject 60 mcg every 30 (thirty) days into the skin.  Marland Kitchen diphenhydramine-acetaminophen (TYLENOL PM) 25-500 MG TABS tablet Take 1 tablet by mouth at bedtime.   . furosemide (LASIX) 40 MG tablet Take 1 tablet (40 mg total) by mouth 2 (two) times daily.  . hydrALAZINE (APRESOLINE) 25 MG tablet Take 1 tablet (25 mg total) by mouth 3 (three) times daily.  Marland Kitchen KLOR-CON M20 20 MEQ tablet TAKE 1 TABLET BY MOUTH EVERY DAY (Patient taking differently: TAKE 20 meq TABLET BY MOUTH EVERY DAY)  . leflunomide (ARAVA) 10 MG tablet Take 10 mg by mouth daily.   . pantoprazole (PROTONIX) 40 MG tablet Take 1 tablet (40 mg total) by mouth daily.  . predniSONE (DELTASONE) 5 MG tablet Take 5 mg by mouth daily.   . Probiotic Product (ALIGN PO) Take 1 tablet by mouth at bedtime.   . Rivaroxaban (XARELTO) 15 MG TABS tablet Take 1 tablet (15 mg total) daily with supper by mouth.  . simvastatin (ZOCOR) 20 MG tablet Take 20 mg by mouth every evening.  . vitamin B-12 (CYANOCOBALAMIN) 1000 MCG tablet Take 1,000 mcg by mouth daily.     Allergies:   Enalapril; Codeine; Oxycodone; and Vicodin [hydrocodone-acetaminophen]   Social History   Socioeconomic History  . Marital status: Married    Spouse name: None  . Number of children: None  . Years of education: None  . Highest education level: None  Social Needs  . Financial resource strain: None  . Food insecurity - worry: None  . Food insecurity - inability: None  . Transportation needs - medical: None  . Transportation needs - non-medical: None  Occupational History  . None  Tobacco Use  . Smoking status: Former Smoker    Last attempt to quit: 04/08/2016    Years  since quitting: 1.8  . Smokeless tobacco: Never Used  Substance and Sexual Activity  . Alcohol use: No    Alcohol/week: 0.0 oz  . Drug use: No  . Sexual activity: None  Other Topics Concern  . None  Social History Narrative  . None     Family History: The patient's family history includes Hypertension in her mother; Stomach cancer in her father. ROS:   Please see the history of present illness.     All other systems reviewed and are negative.  EKGs/Labs/Other Studies Reviewed:    The following studies were reviewed today:  Echocardiogram 01/28/18 Study  Conclusions  - Left ventricle: The cavity size was normal. Wall thickness was   increased in a pattern of mild LVH. The estimated ejection   fraction was 55%. The study is not technically sufficient to   allow evaluation of LV diastolic function. - Aortic valve: There was mild regurgitation. - Mitral valve: There was mild to moderate regurgitation. - Left atrium: The atrium was mildly dilated. - Right atrium: The atrium was mildly dilated. - Pulmonary arteries: PA peak pressure: 40 mm Hg (S).  LHC 11/16/17 Conclusion    LV end diastolic pressure is mildly elevated.   1. Normal coronary angiography 2. Elevated LVEDP  Plan: medical therapy.    EKG:  EKG is  ordered today.  The ekg ordered today demonstrates atrial fibrillation 92 bpm, low voltage.   Recent Labs: 01/27/2018: B Natriuretic Peptide 739.5; TSH 1.919 01/29/2018: Magnesium 1.3 02/10/2018: ALT 14; BUN 36; Creatinine, Ser 3.47; Hemoglobin 10.9; Platelets 179; Potassium 4.2; Sodium 140   Recent Lipid Panel    Component Value Date/Time   CHOL 150 07/06/2011 0216   TRIG 123 07/06/2011 0216   HDL 46 07/06/2011 0216   CHOLHDL 3.3 07/06/2011 0216   VLDL 25 07/06/2011 0216   LDLCALC 79 07/06/2011 0216    Physical Exam:    VS:  BP (!) 144/74   Pulse 92   Ht 5' 2.5" (1.588 m)   Wt 252 lb 12.8 oz (114.7 kg)   LMP  (LMP Unknown)   BMI 45.50 kg/m      Wt Readings from Last 3 Encounters:  02/11/18 252 lb 12.8 oz (114.7 kg)  01/30/18 253 lb 9.6 oz (115 kg)  01/14/18 265 lb (120.2 kg)     Physical Exam  Constitutional: She is oriented to person, place, and time. She appears well-developed and well-nourished.  Very obese female  HENT:  Head: Normocephalic and atraumatic.  Neck: Normal range of motion. Neck supple. No JVD present.  Cardiovascular: Normal rate. An irregularly irregular rhythm present. Exam reveals no gallop and no friction rub.  No murmur heard. Pulmonary/Chest: Effort normal and breath sounds normal. No respiratory distress. She has no wheezes. She has no rales.  Abdominal: Soft. Bowel sounds are normal.  Musculoskeletal: Normal range of motion. She exhibits edema.  Tightness of lower legs, only very minimal pitting.   Neurological: She is alert and oriented to person, place, and time.  Skin: Skin is warm and dry.  Psychiatric: She has a normal mood and affect. Her behavior is normal. Thought content normal.    ASSESSMENT:    1. Acute diastolic CHF (congestive heart failure) ([place])   2. Essential hypertension   3. Permanent atrial fibrillation ([place])   4. AKI (acute kidney injury) ([place])   5. OSA (obstructive sleep apnea)    PLAN:    In order of problems listed above:  Chronic diastolic CHF: hospitalized 01/27/18 for volume overload, dyspnea and edema. Diuresed with 9 lb wt loss. Discharged on lasix 40 mg bid. Wt stable today. Breathing and edema at her baseline. She feels much improved. Would like to continue current lasix. Her SCr is elevated. Will confer with nephrology for recommendations.   Permanent atrial fibrillation: Rate controlled on Coreg.  CHA2DS2/VAS Stroke Risk Score 5 (CHF, HTN, age, DM, female). On Xarelto reduced dose as GFR 31  Hypertension: Hydralazine added in hospital.  Home BP's 120's.Repeat BP here 144/74.  Reasonably well controlled per home measurement. Continue current therapy. Pt  advised to call if her BP creeps  up, can increased hydralazine.   Acute on chronic CKD stage IV: Baseline SCr 2.4-2.7.  Followed by nephrology. SCr was 2.98 on discharge. Yesterday SCr up to 3.47, BUN 36. Will ask nephrology for input.    OSA: uses CPAP consistently.  Morbid obesity:  Body mass index is 45.5 kg/m. Advised on wt loss with better food choices and portion control.   Hyperlipidemia: on Simvastatin followed by PCP.   Medication Adjustments/Labs and Tests Ordered: Current medicines are reviewed at length with the patient today.  Concerns regarding medicines are outlined above. Labs and tests ordered and medication changes are outlined in the patient instructions below:  Patient Instructions  Medication Instructions:  Your physician recommends that you continue on your current medications as directed. Please refer to the Current Medication list given to you today.   Labwork: None ordered  Testing/Procedures: None ordered  Follow-Up: Your physician recommends that you schedule a follow-up appointment in: [place], NP   Any Other Special Instructions Will Be Listed Below (If Applicable).     If you need a refill on your cardiac medications before your next appointment, please call your pharmacy.      Signed, Daune Perch, NP  02/11/2018 11:38 AM    [facility]

## 2018-02-11 ENCOUNTER — Encounter: Payer: Self-pay | Admitting: Cardiology

## 2018-02-11 ENCOUNTER — Ambulatory Visit (INDEPENDENT_AMBULATORY_CARE_PROVIDER_SITE_OTHER): Payer: Medicare Other | Admitting: Cardiology

## 2018-02-11 VITALS — BP 144/74 | HR 92 | Ht 62.5 in | Wt 252.8 lb

## 2018-02-11 DIAGNOSIS — G4733 Obstructive sleep apnea (adult) (pediatric): Secondary | ICD-10-CM

## 2018-02-11 DIAGNOSIS — N179 Acute kidney failure, unspecified: Secondary | ICD-10-CM

## 2018-02-11 DIAGNOSIS — I482 Chronic atrial fibrillation: Secondary | ICD-10-CM

## 2018-02-11 DIAGNOSIS — I5031 Acute diastolic (congestive) heart failure: Secondary | ICD-10-CM

## 2018-02-11 DIAGNOSIS — I10 Essential (primary) hypertension: Secondary | ICD-10-CM | POA: Diagnosis not present

## 2018-02-11 DIAGNOSIS — I4821 Permanent atrial fibrillation: Secondary | ICD-10-CM

## 2018-02-11 NOTE — Patient Instructions (Addendum)
Medication Instructions:  Your physician recommends that you continue on your current medications as directed. Please refer to the Current Medication list given to you today.   Labwork: None ordered  Testing/Procedures: None ordered  Follow-Up: Your physician recommends that you schedule a follow-up appointment in: [place], NP   Any Other Special Instructions Will Be Listed Below (If Applicable).     If you need a refill on your cardiac medications before your next appointment, please call your pharmacy.

## 2018-02-12 ENCOUNTER — Other Ambulatory Visit: Payer: Self-pay | Admitting: Cardiology

## 2018-02-23 DIAGNOSIS — M069 Rheumatoid arthritis, unspecified: Secondary | ICD-10-CM | POA: Diagnosis not present

## 2018-02-23 DIAGNOSIS — R801 Persistent proteinuria, unspecified: Secondary | ICD-10-CM | POA: Diagnosis not present

## 2018-02-23 DIAGNOSIS — N179 Acute kidney failure, unspecified: Secondary | ICD-10-CM | POA: Diagnosis not present

## 2018-02-23 DIAGNOSIS — E669 Obesity, unspecified: Secondary | ICD-10-CM | POA: Diagnosis not present

## 2018-02-23 DIAGNOSIS — R0902 Hypoxemia: Secondary | ICD-10-CM | POA: Diagnosis not present

## 2018-02-23 DIAGNOSIS — I129 Hypertensive chronic kidney disease with stage 1 through stage 4 chronic kidney disease, or unspecified chronic kidney disease: Secondary | ICD-10-CM | POA: Diagnosis not present

## 2018-02-23 DIAGNOSIS — I5033 Acute on chronic diastolic (congestive) heart failure: Secondary | ICD-10-CM | POA: Diagnosis not present

## 2018-02-23 DIAGNOSIS — N184 Chronic kidney disease, stage 4 (severe): Secondary | ICD-10-CM | POA: Diagnosis not present

## 2018-02-23 DIAGNOSIS — D631 Anemia in chronic kidney disease: Secondary | ICD-10-CM | POA: Diagnosis not present

## 2018-02-23 DIAGNOSIS — I480 Paroxysmal atrial fibrillation: Secondary | ICD-10-CM | POA: Diagnosis not present

## 2018-02-23 DIAGNOSIS — E1122 Type 2 diabetes mellitus with diabetic chronic kidney disease: Secondary | ICD-10-CM | POA: Diagnosis not present

## 2018-02-24 DIAGNOSIS — N179 Acute kidney failure, unspecified: Secondary | ICD-10-CM | POA: Diagnosis not present

## 2018-03-01 DIAGNOSIS — M25562 Pain in left knee: Secondary | ICD-10-CM | POA: Diagnosis not present

## 2018-03-09 NOTE — Progress Notes (Signed)
Cardiology Office Note   Date:  03/10/2018   ID:  Melissa Bartlett, DOB 12-27-1946, MRN [id number]  PCP:  Donald Prose, MD  Cardiologist:  Dr.  Marlou Porch   Chief Complaint  Patient presents with  . Edema      History of Present Illness: Melissa Bartlett is a 72 y.o. female who presents for heart failure- diastolic follow up  She has ahx of difficult to control HTN, DM, OSA, PAF on 07/08/16 s/p DCCV 07/2016, now permanent Afib (on xarelto), CKD stage IV, RAcha2DS2VASc is 4.   In Oct admitted for chest pain that was reproducible.  Neg troponins. --stress test was done and not normal, possible ant defect so underwent cath and found to have normal coronary angiography and elevated LVEDP. 11/16/17  An echo on 01/28/18 showed EF 55%, mild LVH, mild AR, mod MR, mildly dilated LA and RA, peak PA pressure 40 mm Hg  Post cath 01/14/18 but she has significant edema.  She can hardly walk due to the edema of both legs. No SOB just swelling.  She is to see renal at the end of the month.  No chest pain.  She has not been taking her lasix daily.  She does monitor her salt intake.  Her labs post cath no change in Cr.    She was admitted 01/27/18 with decreased sp02 and edema.  She was diuresed and improved.  Seen in the office 02/11/18 and much improved.  She did have AKI in the hospital but Cr was stable at discharge.  Lasix 40 mg BID.  Last Cr 3.47.  She has seen DR. Coladonato and did not adjust lasix.  She has anemia and receives her arnasp injection today.  Her BP is elevated but she will call when it is checked at short stay and give those results.   With renal Cr 3.15 02/10/18  She watches her salt closely.  She feels much better.  No SOB, some Lt arm discomfort at times and some chest discomfort but with normal coronary arteries in Nov. 2018 do not believe it is angina.  Her wt is back to her baseline.  Her sp02 today is 93%.     Past Medical History:  Diagnosis Date  . CHF (congestive heart  failure) ([place])   . CKD (chronic kidney disease), stage IV ([place])   . Complication of anesthesia   . Diabetes ([place])   . HTN (hypertension)   . OSA (obstructive sleep apnea)    with AHI 11/hr with nocturnal hypoxemia   . PAF (paroxysmal atrial fibrillation) ([place])    s/p DCCV in 8/17  . RA (rheumatoid arthritis) ([place])     Past Surgical History:  Procedure Laterality Date  . ANKLE FUSION Right   . BREAST BIOPSY    . CARDIOVERSION N/A 08/15/2016   Procedure: CARDIOVERSION;  Surgeon: Jerline Pain, MD;  Location[facility] ENDOSCOPY;  Service: Cardiovascular;  Laterality: N/A;  . CHOLECYSTECTOMY    . GALLBLADDER SURGERY    . HYSTEROTOMY     Patrtial  . HYSTEROTOMY     Complete  . KNEE ARTHROSCOPY    . LEFT HEART CATH AND CORONARY ANGIOGRAPHY N/A 11/16/2017   Procedure: LEFT HEART CATH AND CORONARY ANGIOGRAPHY;  Surgeon: Martinique, Peter M, MD;  Location: [place] CV LAB;  Service: Cardiovascular;  Laterality: N/A;     Current Outpatient Medications  Medication Sig Dispense Refill  . amLODipine (NORVASC) 10 MG tablet Take 1 tablet (10 mg total) by  mouth daily. 90 tablet 3  . Calcium Carbonate-Vitamin D3 (CALCIUM 600/VITAMIN D) 600-400 MG-UNIT TABS Take 1 tablet by mouth 2 (two) times daily.    . carvedilol (COREG) 25 MG tablet TAKE 1 TABLET BY MOUTH TWICE A DAY WITH A MEAL 180 tablet 2  . COMBIGAN 0.2-0.5 % ophthalmic solution Place 1 drop into both eyes 2 (two) times daily.     . Darbepoetin Alfa (ARANESP) 60 MCG/0.3ML SOSY injection Inject 60 mcg every 30 (thirty) days into the skin.    Marland Kitchen diphenhydramine-acetaminophen (TYLENOL PM) 25-500 MG TABS tablet Take 1 tablet by mouth at bedtime.     . furosemide (LASIX) 40 MG tablet Take 1 tablet (40 mg total) by mouth 2 (two) times daily. 30 tablet 1  . hydrALAZINE (APRESOLINE) 25 MG tablet Take 1 tablet (25 mg total) by mouth 3 (three) times daily. 90 tablet 1  . leflunomide (ARAVA) 10 MG tablet Take 10 mg by mouth daily.     . pantoprazole  (PROTONIX) 40 MG tablet Take 1 tablet (40 mg total) by mouth daily. 30 tablet 0  . potassium chloride SA (KLOR-CON M20) 20 MEQ tablet Take 1 tablet (20 mEq total) by mouth daily. 90 tablet 3  . predniSONE (DELTASONE) 5 MG tablet Take 5 mg by mouth daily.     . Probiotic Product (ALIGN PO) Take 1 tablet by mouth at bedtime.     . Rivaroxaban (XARELTO) 15 MG TABS tablet Take 1 tablet (15 mg total) daily with supper by mouth. 30 tablet 8  . simvastatin (ZOCOR) 20 MG tablet Take 20 mg by mouth every evening.  2  . vitamin B-12 (CYANOCOBALAMIN) 1000 MCG tablet Take 1,000 mcg by mouth daily.     No current facility-administered medications for this visit.    Facility-Administered Medications Ordered in Other Visits  Medication Dose Route Frequency Provider Last Rate Last Dose  . Darbepoetin Alfa (ARANESP) injection 60 mcg  60 mcg Subcutaneous Q28 days Donato Heinz, MD        Allergies:   Enalapril; Codeine; Oxycodone; and Vicodin [hydrocodone-acetaminophen]    Social History:  The patient  reports that she quit smoking about 23 months ago. she has never used smokeless tobacco. She reports that she does not drink alcohol or use drugs.   Family History:  The patient's family history includes Hypertension in her mother; Stomach cancer in her father.    ROS:  General:no colds or fevers, + weight decrease Skin:no rashes or ulcers HEENT:no blurred vision, no congestion CV:see HPI PUL:see HPI GI:no diarrhea constipation or melena, no indigestion GU:no hematuria, no dysuria MS:no joint pain, no claudication Neuro:no syncope, no lightheadedness Endo:no diabetes, no thyroid disease  Wt Readings from Last 3 Encounters:  03/10/18 250 lb (113.4 kg)  02/11/18 252 lb 12.8 oz (114.7 kg)  01/30/18 253 lb 9.6 oz (115 kg)     PHYSICAL EXAM: VS:  BP (!) 158/80 (BP Location: Left Arm, Patient Position: Sitting, Cuff Size: Large)   Pulse 84   Ht 5' 2.5" (1.588 m)   Wt 250 lb (113.4 kg)   LMP   (LMP Unknown)   SpO2 93%   BMI 45.00 kg/m  , BMI Body mass index is 45 kg/m. General:Pleasant affect, NAD Skin:Warm and dry, brisk capillary refill HEENT:normocephalic, sclera clear, mucus membranes moist Neck:supple, no JVD, no bruits  Heart:mildly irregular irrgluar without murmur, gallup, rub or click Lungs:clear without rales, rhonchi, or wheezes EHM:CNOB, non tender, + BS, do not palpate liver spleen  or masses Ext:+ lower ext edema Lt greater than right with old injury in Lt ankle, 2+ pedal pulses, 2+ radial pulses Neuro:alert and oriented X 3, MAE, follows commands, + facial symmetry    EKG:  EKG is NOT ordered today.    Recent Labs: 01/27/2018: B Natriuretic Peptide 739.5; TSH 1.919 01/29/2018: Magnesium 1.3 03/10/2018: ALT 15; BUN 41; Creatinine, Ser 2.77; Hemoglobin 12.2; Platelets 181; Potassium 4.2; Sodium 142    Lipid Panel    Component Value Date/Time   CHOL 150 07/06/2011 0216   TRIG 123 07/06/2011 0216   HDL 46 07/06/2011 0216   CHOLHDL 3.3 07/06/2011 0216   VLDL 25 07/06/2011 0216   LDLCALC 79 07/06/2011 0216       Other studies Reviewed: Additional studies/ records that were reviewed today include: .  Procedures   LEFT HEART CATH AND CORONARY ANGIOGRAPHY  Conclusion     LV end diastolic pressure is mildly elevated.  1. Normal coronary angiography 2. Elevated LVEDP  Plan: medical therapy.    Echo 03/15/17 Study Conclusions  - Left ventricle: The cavity size was normal. There was mild concentric hypertrophy. Systolic function was vigorous. The estimated ejection fraction was in the range of 65% to 70%. Wall motion was normal; there were no regional wall motion abnormalities. Doppler parameters are consistent with high ventricular filling pressure. - Aortic valve: There was mild regurgitation. - Mitral valve: There was trivial regurgitation. Valve area by continuity equation (using LVOT flow): 2.49 cm^2. - Left atrium:  The atrium was moderately dilated. - Pulmonary arteries: Systolic pressure could not be accurately estimated.   ASSESSMENT AND PLAN:  1.  Acute on chronic diastolic HF, much improved and wt back to baseline, we discussed low salt and some exercise.  Her ankles and feet are still swollen though improved.  Will discuss with Dr. Marlou Porch ? Stopping amlodipine and increasing hydralazine to see if lower ext edema improves.  Will have her follow up with Dr. Marlou Porch in 2-3 months.  She is aware to call if more swelling-- she may take an extra 40 mg of lasix in a week for gain of 3 lbs in a day or 5 pounds in a week.  But if no help she should call.   2.  HTN elevated here, recheck at [facility] stay at [facility] was 130/80   3.  CKD -4 follow with renal, will check BMP today continue current lasix for now.  4.  Permanent atrial fib rate controlled.   5.  Anticoagulation xarelto at reduced dose for CKD 4 at 15 mg daily.    6.  HLD on statin and controlled   7.  OSA on CPAP followed by Dr. Radford Pax.    Current medicines are reviewed with the patient today.  The patient Has no concerns regarding medicines.  The following changes have been made:  See above Labs/ tests ordered today include:see above  Disposition:   FU:  see above  Signed, Cecilie Kicks, NP  03/10/2018 5:50 PM    [facility], [place], [address] [place], [place] Phone: [phone]; Fax: [phone]

## 2018-03-10 ENCOUNTER — Encounter (HOSPITAL_COMMUNITY)
Admission: RE | Admit: 2018-03-10 | Discharge: 2018-03-10 | Disposition: A | Discharge status: Home / Self Care | Payer: Medicare Other | Source: Ambulatory Visit | Attending: Nephrology | Admitting: Nephrology

## 2018-03-10 ENCOUNTER — Ambulatory Visit (INDEPENDENT_AMBULATORY_CARE_PROVIDER_SITE_OTHER): Payer: Medicare Other | Admitting: Cardiology

## 2018-03-10 ENCOUNTER — Encounter: Payer: Self-pay | Admitting: Cardiology

## 2018-03-10 VITALS — BP 158/80 | HR 84 | Ht 62.5 in | Wt 250.0 lb

## 2018-03-10 VITALS — BP 130/56 | HR 84 | Temp 97.7°F | Resp 18

## 2018-03-10 DIAGNOSIS — E782 Mixed hyperlipidemia: Secondary | ICD-10-CM

## 2018-03-10 DIAGNOSIS — N289 Disorder of kidney and ureter, unspecified: Secondary | ICD-10-CM | POA: Diagnosis not present

## 2018-03-10 DIAGNOSIS — Z7901 Long term (current) use of anticoagulants: Secondary | ICD-10-CM | POA: Diagnosis not present

## 2018-03-10 DIAGNOSIS — G4733 Obstructive sleep apnea (adult) (pediatric): Secondary | ICD-10-CM

## 2018-03-10 DIAGNOSIS — I5031 Acute diastolic (congestive) heart failure: Secondary | ICD-10-CM

## 2018-03-10 DIAGNOSIS — I482 Chronic atrial fibrillation: Secondary | ICD-10-CM

## 2018-03-10 DIAGNOSIS — D631 Anemia in chronic kidney disease: Secondary | ICD-10-CM | POA: Insufficient documentation

## 2018-03-10 DIAGNOSIS — N184 Chronic kidney disease, stage 4 (severe): Secondary | ICD-10-CM

## 2018-03-10 DIAGNOSIS — I10 Essential (primary) hypertension: Secondary | ICD-10-CM | POA: Diagnosis not present

## 2018-03-10 DIAGNOSIS — I4821 Permanent atrial fibrillation: Secondary | ICD-10-CM

## 2018-03-10 DIAGNOSIS — R600 Localized edema: Secondary | ICD-10-CM | POA: Diagnosis not present

## 2018-03-10 LAB — FERRITIN: Ferritin: 105 ng/mL (ref 11–307)

## 2018-03-10 LAB — IRON AND TIBC
IRON: 74 ug/dL (ref 28–170)
SATURATION RATIOS: 26 % (ref 10.4–31.8)
TIBC: 281 ug/dL (ref 250–450)
UIBC: 207 ug/dL

## 2018-03-10 LAB — BASIC METABOLIC PANEL
BUN / CREAT RATIO: 15 (ref 12–28)
BUN: 42 mg/dL — AB (ref 8–27)
CO2: 21 mmol/L (ref 20–29)
CREATININE: 2.79 mg/dL — AB (ref 0.57–1.00)
Calcium: 8 mg/dL — ABNORMAL LOW (ref 8.7–10.3)
Chloride: 104 mmol/L (ref 96–106)
GFR calc Af Amer: 19 mL/min/{1.73_m2} — ABNORMAL LOW (ref >59)
GFR, EST NON AFRICAN AMERICAN: 16 mL/min/{1.73_m2} — AB (ref >59)
Glucose: 118 mg/dL — ABNORMAL HIGH (ref 65–99)
Potassium: 4 mmol/L (ref 3.5–5.2)
SODIUM: 143 mmol/L (ref 134–144)

## 2018-03-10 LAB — CBC
HEMATOCRIT: 40.4 % (ref 36.0–46.0)
HEMOGLOBIN: 12.2 g/dL (ref 12.0–15.0)
MCH: 26 pg (ref 26.0–34.0)
MCHC: 30.2 g/dL (ref 30.0–36.0)
MCV: 86.1 fL (ref 78.0–100.0)
Platelets: 181 10*3/uL (ref 150–400)
RBC: 4.69 MIL/uL (ref 3.87–5.11)
RDW: 17.3 % — AB (ref 11.5–15.5)
WBC: 12.9 10*3/uL — AB (ref 4.0–10.5)

## 2018-03-10 LAB — COMPREHENSIVE METABOLIC PANEL
ALT: 15 U/L (ref 14–54)
ANION GAP: 11 (ref 5–15)
AST: 13 U/L — ABNORMAL LOW (ref 15–41)
Albumin: 3.2 g/dL — ABNORMAL LOW (ref 3.5–5.0)
Alkaline Phosphatase: 71 U/L (ref 38–126)
BUN: 41 mg/dL — AB (ref 6–20)
CHLORIDE: 106 mmol/L (ref 101–111)
CO2: 25 mmol/L (ref 22–32)
Calcium: 8 mg/dL — ABNORMAL LOW (ref 8.9–10.3)
Creatinine, Ser: 2.77 mg/dL — ABNORMAL HIGH (ref 0.44–1.00)
GFR calc Af Amer: 19 mL/min — ABNORMAL LOW (ref >60)
GFR, EST NON AFRICAN AMERICAN: 16 mL/min — AB (ref >60)
Glucose, Bld: 120 mg/dL — ABNORMAL HIGH (ref 65–99)
POTASSIUM: 4.2 mmol/L (ref 3.5–5.1)
Sodium: 142 mmol/L (ref 135–145)
TOTAL PROTEIN: 6.6 g/dL (ref 6.5–8.1)
Total Bilirubin: 0.5 mg/dL (ref 0.3–1.2)

## 2018-03-10 LAB — PHOSPHORUS: PHOSPHORUS: 4.1 mg/dL (ref 2.5–4.6)

## 2018-03-10 MED ORDER — DARBEPOETIN ALFA 60 MCG/0.3ML IJ SOSY: 60.0000 ug | PREFILLED_SYRINGE | INTRAMUSCULAR | Status: DC

## 2018-03-10 NOTE — Patient Instructions (Signed)
Medication Instructions: Your physician has recommended you make the following change in your medication:  -1) You may take an extra 40 mg of Furosemide (Lasix) once a week if you gain 3 pounds in a day or 5 pounds in a week  Labwork: Your physician has recommended that you have lab work today: BMET  Procedures/Testing: None Ordered  Follow-Up: Your physician recommends that you schedule a follow-up appointment in: 2 MONTHS with Dr. Marlou Porch.   If you need a refill on your cardiac medications before your next appointment, please call your pharmacy.

## 2018-03-11 ENCOUNTER — Ambulatory Visit: Payer: Medicare Other | Admitting: Cardiology

## 2018-03-12 ENCOUNTER — Telehealth: Payer: Self-pay | Admitting: *Deleted

## 2018-03-12 MED ORDER — AMLODIPINE BESYLATE 5 MG PO TABS: 5.0000 mg | ORAL_TABLET | Freq: Every day | ORAL | 3 refills | Status: DC

## 2018-03-12 MED ORDER — HYDRALAZINE HCL 50 MG PO TABS: 50.0000 mg | ORAL_TABLET | Freq: Three times a day (TID) | ORAL | 3 refills | Status: DC

## 2018-03-12 NOTE — Telephone Encounter (Signed)
-----   Message from Isaiah Serge, NP sent at 03/11/2018  2:14 PM EDT ----- Let [name] know that I talked to Dr. Marlou Porch and we are concerned that the amlodipine could be adding to edema of feet.  So decrease to 5 mg daily the amlodipine and increase hydralazine to 50 mg from 25 mg same times a day.     ----- Message ----- From: Jerline Pain, MD Sent: 03/11/2018   1:01 PM To: Isaiah Serge, NP  Sure, Decrease amlodipine to 5, increase hydral to [place], MD  ----- Message ----- From: Isaiah Serge, NP Sent: 03/10/2018   6:17 PM To: Jerline Pain, MD  Saw Ms. Melissa Bartlett, still with edema feet and ankles, ? Stop amlodipine and increase hydralazine. - wanted to get your opinion thanks.

## 2018-03-24 ENCOUNTER — Ambulatory Visit (HOSPITAL_COMMUNITY)
Admission: RE | Admit: 2018-03-24 | Discharge: 2018-03-24 | Disposition: A | Discharge status: Home / Self Care | Payer: Medicare Other | Source: Ambulatory Visit | Attending: Nephrology | Admitting: Nephrology

## 2018-03-24 VITALS — BP 152/84 | HR 92 | Temp 97.8°F | Resp 18

## 2018-03-24 DIAGNOSIS — N184 Chronic kidney disease, stage 4 (severe): Secondary | ICD-10-CM | POA: Diagnosis not present

## 2018-03-24 DIAGNOSIS — D631 Anemia in chronic kidney disease: Secondary | ICD-10-CM | POA: Diagnosis not present

## 2018-03-24 DIAGNOSIS — Z1231 Encounter for screening mammogram for malignant neoplasm of breast: Secondary | ICD-10-CM | POA: Diagnosis not present

## 2018-03-24 LAB — POCT HEMOGLOBIN-HEMACUE: HEMOGLOBIN: 11.8 g/dL — AB (ref 12.0–15.0)

## 2018-03-24 MED ORDER — DARBEPOETIN ALFA 60 MCG/0.3ML IJ SOSY
PREFILLED_SYRINGE | INTRAMUSCULAR | Status: AC
  Filled 2018-03-24: qty 0.3

## 2018-03-24 MED ORDER — DARBEPOETIN ALFA 60 MCG/0.3ML IJ SOSY
60.0000 ug | PREFILLED_SYRINGE | INTRAMUSCULAR | Status: DC
  Administered 2018-03-24: 60 ug via SUBCUTANEOUS

## 2018-03-30 ENCOUNTER — Telehealth: Payer: Self-pay | Admitting: Cardiology

## 2018-03-30 DIAGNOSIS — R921 Mammographic calcification found on diagnostic imaging of breast: Secondary | ICD-10-CM | POA: Diagnosis not present

## 2018-03-30 NOTE — Telephone Encounter (Signed)
New message:        [facility] Pre-operative Risk Assessment    Request for surgical clearance:  1. What type of surgery is being performed? Needle core breast biopsy  2. When is this surgery scheduled? N/A   3. What type of clearance is required (medical clearance vs. Pharmacy clearance to hold med vs. Both)? Pharmacy  4. Are there any medications that need to be held prior to surgery and how long?Xarelto  5. Practice name and name of physician performing surgery? [facility]/ Dr. Christene Slates  6. What is your office phone and fax number? Fax [phone]  7. Anesthesia type (None, local, MAC, general) ? None   Janan Ridge 03/30/2018, 2:39 PM  _________________________________________________________________   (provider comments below)

## 2018-04-02 NOTE — Telephone Encounter (Signed)
F/U Melissa Bartlett calling back with [facility] for update on clearance.

## 2018-04-02 NOTE — Telephone Encounter (Signed)
Patient with diagnosis of Afib on Xarelto for anticoagulation.    Procedure: breast biopsy Date of procedure: 4/5???  CHADS2-VASc score of  5 (CHF, HTN, AGE, DM2, stroke/tia x 2, CAD, AGE, female)  CrCl 68ml/min  Per office protocol, patient can hold Xarelto for 2 days prior to procedure.

## 2018-04-02 NOTE — Telephone Encounter (Signed)
Faxed cardiac clearance to Dr Leota Sauers office.

## 2018-04-04 NOTE — Progress Notes (Signed)
Cardiology Office Note:    Date:  04/05/2018   ID:  Melissa Bartlett, DOB 09-May-1946, MRN [id number]  PCP:  Donald Prose, MD  Cardiologist:  Candee Furbish, MD    Referring MD: Donald Prose, MD   Chief Complaint  Patient presents with  . Sleep Apnea  . Hypertension    History of Present Illness:    Melissa Bartlett is a 72 y.o. female with a hx of difficult to control HTN,  OSA on CPAP, PAF s/p DCCV 07/2016, now permanent Afib on xarelto for CHADS2VASC score of 4 and CKD stage IV.  She is doing well with her CPAP device.  She tolerates the full face mask and feels the pressure is adequate.  Since going on CPAP She feels rested in the am and has no significant daytime sleepiness.  She denies any significant mouth or nasal dryness or nasal congestion.  According to her husband she does not snore.      Past Medical History:  Diagnosis Date  . CHF (congestive heart failure) ([place])   . CKD (chronic kidney disease), stage IV ([place])   . Complication of anesthesia   . Diabetes ([place])   . HTN (hypertension)   . OSA (obstructive sleep apnea)    with AHI 11/hr with nocturnal hypoxemia   . PAF (paroxysmal atrial fibrillation) ([place])    s/p DCCV in 8/17  . RA (rheumatoid arthritis) ([place])     Past Surgical History:  Procedure Laterality Date  . ANKLE FUSION Right   . BREAST BIOPSY    . CARDIOVERSION N/A 08/15/2016   Procedure: CARDIOVERSION;  Surgeon: Jerline Pain, MD;  Location: [facility] ENDOSCOPY;  Service: Cardiovascular;  Laterality: N/A;  . CHOLECYSTECTOMY    . GALLBLADDER SURGERY    . HYSTEROTOMY     Patrtial  . HYSTEROTOMY     Complete  . KNEE ARTHROSCOPY    . LEFT HEART CATH AND CORONARY ANGIOGRAPHY N/A 11/16/2017   Procedure: LEFT HEART CATH AND CORONARY ANGIOGRAPHY;  Surgeon: Martinique, Peter M, MD;  Location: [facility] CV LAB;  Service: Cardiovascular;  Laterality: N/A;    Current Medications: Current Meds  Medication Sig  . amLODipine (NORVASC) 5 MG tablet Take 1 tablet (5 mg  total) by mouth daily.  . Calcium Carbonate-Vitamin D3 (CALCIUM 600/VITAMIN D) 600-400 MG-UNIT TABS Take 1 tablet by mouth 2 (two) times daily.  . carvedilol (COREG) 25 MG tablet TAKE 1 TABLET BY MOUTH TWICE A DAY WITH A MEAL  . COMBIGAN 0.2-0.5 % ophthalmic solution Place 1 drop into both eyes 2 (two) times daily.   . Darbepoetin Alfa (ARANESP) 60 MCG/0.3ML SOSY injection Inject 60 mcg every 30 (thirty) days into the skin.  Marland Kitchen diphenhydramine-acetaminophen (TYLENOL PM) 25-500 MG TABS tablet Take 1 tablet by mouth at bedtime.   . furosemide (LASIX) 40 MG tablet Take 1 tablet (40 mg total) by mouth 2 (two) times daily.  . hydrALAZINE (APRESOLINE) 50 MG tablet Take 1 tablet (50 mg total) by mouth 3 (three) times daily.  Marland Kitchen leflunomide (ARAVA) 10 MG tablet Take 10 mg by mouth daily.   . pantoprazole (PROTONIX) 40 MG tablet Take 1 tablet (40 mg total) by mouth daily.  . potassium chloride SA (KLOR-CON M20) 20 MEQ tablet Take 1 tablet (20 mEq total) by mouth daily.  . predniSONE (DELTASONE) 5 MG tablet Take 5 mg by mouth daily.   . Probiotic Product (ALIGN PO) Take 1 tablet by mouth at bedtime.   . Rivaroxaban (XARELTO)  15 MG TABS tablet Take 1 tablet (15 mg total) daily with supper by mouth.  . simvastatin (ZOCOR) 20 MG tablet Take 20 mg by mouth every evening.  . vitamin B-12 (CYANOCOBALAMIN) 1000 MCG tablet Take 1,000 mcg by mouth daily.     Allergies:   Enalapril; Codeine; Oxycodone; and Vicodin [hydrocodone-acetaminophen]   Social History   Socioeconomic History  . Marital status: Married    Spouse name: Not on file  . Number of children: Not on file  . Years of education: Not on file  . Highest education level: Not on file  Occupational History  . Not on file  Social Needs  . Financial resource strain: Not on file  . Food insecurity:    Worry: Not on file    Inability: Not on file  . Transportation needs:    Medical: Not on file    Non-medical: Not on file  Tobacco Use  .  Smoking status: Former Smoker    Last attempt to quit: 04/08/2016    Years since quitting: 1.9  . Smokeless tobacco: Never Used  Substance and Sexual Activity  . Alcohol use: No    Alcohol/week: 0.0 oz  . Drug use: No  . Sexual activity: Not on file  Lifestyle  . Physical activity:    Days per week: Not on file    Minutes per session: Not on file  . Stress: Not on file  Relationships  . Social connections:    Talks on phone: Not on file    Gets together: Not on file    Attends religious service: Not on file    Active member of club or organization: Not on file    Attends meetings of clubs or organizations: Not on file    Relationship status: Not on file  Other Topics Concern  . Not on file  Social History Narrative  . Not on file     Family History: The patient's family history includes Hypertension in her mother; Stomach cancer in her father.  ROS:   Please see the history of present illness.    ROS  All other systems reviewed and negative.   EKGs/Labs/Other Studies Reviewed:    The following studies were reviewed today: PAP download  EKG:  EKG is not ordered today.   Recent Labs: 01/27/2018: B Natriuretic Peptide 739.5; TSH 1.919 01/29/2018: Magnesium 1.3 03/10/2018: ALT 15; BUN 41; Creatinine, Ser 2.77; Platelets 181; Potassium 4.2; Sodium 142 03/24/2018: Hemoglobin 11.8   Recent Lipid Panel    Component Value Date/Time   CHOL 150 07/06/2011 0216   TRIG 123 07/06/2011 0216   HDL 46 07/06/2011 0216   CHOLHDL 3.3 07/06/2011 0216   VLDL 25 07/06/2011 0216   LDLCALC 79 07/06/2011 0216    Physical Exam:    VS:  BP 128/84   Pulse 92   Ht 5' 2.5" (1.588 m)   Wt 255 lb 12.8 oz (116 kg)   LMP  (LMP Unknown)   BMI 46.04 kg/m     Wt Readings from Last 3 Encounters:  04/05/18 255 lb 12.8 oz (116 kg)  03/10/18 250 lb (113.4 kg)  02/11/18 252 lb 12.8 oz (114.7 kg)     GEN:  Well nourished, well developed in no acute distress HEENT: Normal NECK: No JVD; No  carotid bruits LYMPHATICS: No lymphadenopathy CARDIAC: irregularly irregular, no murmurs, rubs, gallops RESPIRATORY:  Clear to auscultation without rales, wheezing or rhonchi  ABDOMEN: Soft, non-tender, non-distended MUSCULOSKELETAL:  1-2+ edema; No deformity  SKIN: Warm and dry NEUROLOGIC:  Alert and oriented x 3 PSYCHIATRIC:  Normal affect   ASSESSMENT:    1. OSA (obstructive sleep apnea)   2. Essential hypertension   3. Morbid obesity ([place])    PLAN:    In order of problems listed above:  1.  OSA - the patient is tolerating PAP therapy well without any problems. The PAP download was reviewed today and showed an AHI of 2.1/hr on 13 cm H2O with 90% compliance in using more than 4 hours nightly.  The patient has been using and benefiting from PAP use and will continue to benefit from therapy.   2.  HTN - her BP is fairly well controlled on exam today. She will continue on carvedilol 25mg  BID,  Amlodipine 5mg  daily, Hydralazine 50mg  TID.  3. Morbid obesity - I have encouraged her to get into a routine exercise program and cut back on carbs and portions. She has plans to start working out at [employer].    Medication Adjustments/Labs and Tests Ordered: Current medicines are reviewed at length with the patient today.  Concerns regarding medicines are outlined above.  No orders of the defined types were placed in this encounter.  No orders of the defined types were placed in this encounter.   Signed, Fransico Him, MD  04/05/2018 10:57 AM    [place]

## 2018-04-05 ENCOUNTER — Ambulatory Visit (INDEPENDENT_AMBULATORY_CARE_PROVIDER_SITE_OTHER): Payer: Medicare Other | Admitting: Cardiology

## 2018-04-05 ENCOUNTER — Encounter: Payer: Self-pay | Admitting: Cardiology

## 2018-04-05 VITALS — BP 128/84 | HR 92 | Ht 62.5 in | Wt 255.8 lb

## 2018-04-05 DIAGNOSIS — I10 Essential (primary) hypertension: Secondary | ICD-10-CM

## 2018-04-05 DIAGNOSIS — G4733 Obstructive sleep apnea (adult) (pediatric): Secondary | ICD-10-CM | POA: Diagnosis not present

## 2018-04-05 MED ORDER — FUROSEMIDE 40 MG PO TABS: 40.0000 mg | ORAL_TABLET | Freq: Two times a day (BID) | ORAL | 3 refills | Status: DC

## 2018-04-05 MED ORDER — FUROSEMIDE 40 MG PO TABS
40.0000 mg | ORAL_TABLET | Freq: Two times a day (BID) | ORAL | 2 refills | Status: DC
Start: 2018-04-05 — End: 2018-04-05

## 2018-04-05 NOTE — Patient Instructions (Signed)
Medication Instructions:  Your physician recommends that you continue on your current medications as directed. Please refer to the Current Medication list given to you today.  If you need a refill on your cardiac medications, please contact your pharmacy first.  Labwork: None ordered   Testing/Procedures: None ordered   Follow-Up: Your physician wants you to follow-up in: 1 year with Dr. Turner. You will receive a reminder letter in the mail two months in advance. If you don't receive a letter, please call our office to schedule the follow-up appointment.  Any Other Special Instructions Will Be Listed Below (If Applicable).   Thank you for choosing [facility]    Rena Allanna Bresee, RN  [phone]  If you need a refill on your cardiac medications before your next appointment, please call your pharmacy.   

## 2018-04-07 ENCOUNTER — Encounter (HOSPITAL_COMMUNITY): Payer: Medicare Other

## 2018-04-07 ENCOUNTER — Other Ambulatory Visit: Payer: Self-pay | Admitting: Radiology

## 2018-04-07 DIAGNOSIS — I110 Hypertensive heart disease with heart failure: Secondary | ICD-10-CM | POA: Diagnosis not present

## 2018-04-07 DIAGNOSIS — M069 Rheumatoid arthritis, unspecified: Secondary | ICD-10-CM | POA: Diagnosis not present

## 2018-04-07 DIAGNOSIS — I5030 Unspecified diastolic (congestive) heart failure: Secondary | ICD-10-CM | POA: Diagnosis not present

## 2018-04-07 DIAGNOSIS — R921 Mammographic calcification found on diagnostic imaging of breast: Secondary | ICD-10-CM | POA: Diagnosis not present

## 2018-04-07 DIAGNOSIS — N6310 Unspecified lump in the right breast, unspecified quadrant: Secondary | ICD-10-CM | POA: Diagnosis not present

## 2018-04-07 DIAGNOSIS — H409 Unspecified glaucoma: Secondary | ICD-10-CM | POA: Diagnosis not present

## 2018-04-07 DIAGNOSIS — E785 Hyperlipidemia, unspecified: Secondary | ICD-10-CM | POA: Diagnosis not present

## 2018-04-07 DIAGNOSIS — I4891 Unspecified atrial fibrillation: Secondary | ICD-10-CM | POA: Diagnosis not present

## 2018-04-07 DIAGNOSIS — N183 Chronic kidney disease, stage 3 (moderate): Secondary | ICD-10-CM | POA: Diagnosis not present

## 2018-04-07 DIAGNOSIS — Z0000 Encounter for general adult medical examination without abnormal findings: Secondary | ICD-10-CM | POA: Diagnosis not present

## 2018-04-07 DIAGNOSIS — Z1389 Encounter for screening for other disorder: Secondary | ICD-10-CM | POA: Diagnosis not present

## 2018-04-07 DIAGNOSIS — E1122 Type 2 diabetes mellitus with diabetic chronic kidney disease: Secondary | ICD-10-CM | POA: Diagnosis not present

## 2018-04-07 DIAGNOSIS — Z6841 Body Mass Index (BMI) 40.0 and over, adult: Secondary | ICD-10-CM | POA: Diagnosis not present

## 2018-04-21 ENCOUNTER — Ambulatory Visit (HOSPITAL_COMMUNITY)
Admission: RE | Admit: 2018-04-21 | Discharge: 2018-04-21 | Disposition: A | Discharge status: Home / Self Care | Payer: Medicare Other | Source: Ambulatory Visit | Attending: Nephrology | Admitting: Nephrology

## 2018-04-21 VITALS — BP 138/70 | HR 84 | Temp 98.3°F | Resp 20

## 2018-04-21 DIAGNOSIS — D631 Anemia in chronic kidney disease: Secondary | ICD-10-CM | POA: Insufficient documentation

## 2018-04-21 DIAGNOSIS — N184 Chronic kidney disease, stage 4 (severe): Secondary | ICD-10-CM | POA: Insufficient documentation

## 2018-04-21 LAB — CBC WITH DIFFERENTIAL/PLATELET
Basophils Absolute: 0 10*3/uL (ref 0.0–0.1)
Basophils Relative: 0 %
EOS ABS: 0.1 10*3/uL (ref 0.0–0.7)
Eosinophils Relative: 1 %
HEMATOCRIT: 34.4 % — AB (ref 36.0–46.0)
HEMOGLOBIN: 10.3 g/dL — AB (ref 12.0–15.0)
LYMPHS ABS: 2.5 10*3/uL (ref 0.7–4.0)
Lymphocytes Relative: 20 %
MCH: 26.6 pg (ref 26.0–34.0)
MCHC: 29.9 g/dL — AB (ref 30.0–36.0)
MCV: 88.9 fL (ref 78.0–100.0)
MONO ABS: 1.2 10*3/uL — AB (ref 0.1–1.0)
MONOS PCT: 10 %
NEUTROS ABS: 8.5 10*3/uL — AB (ref 1.7–7.7)
NEUTROS PCT: 69 %
Platelets: 225 10*3/uL (ref 150–400)
RBC: 3.87 MIL/uL (ref 3.87–5.11)
RDW: 18.2 % — AB (ref 11.5–15.5)
WBC: 12.4 10*3/uL — ABNORMAL HIGH (ref 4.0–10.5)

## 2018-04-21 MED ORDER — DARBEPOETIN ALFA 60 MCG/0.3ML IJ SOSY
PREFILLED_SYRINGE | INTRAMUSCULAR | Status: AC
  Filled 2018-04-21: qty 0.3

## 2018-04-21 MED ORDER — DARBEPOETIN ALFA 60 MCG/0.3ML IJ SOSY
60.0000 ug | PREFILLED_SYRINGE | INTRAMUSCULAR | Status: DC
  Administered 2018-04-21: 11:00:00 60 ug via SUBCUTANEOUS

## 2018-04-22 LAB — PTH, INTACT AND CALCIUM
CALCIUM TOTAL (PTH): 7.1 mg/dL — AB (ref 8.7–10.3)
PTH: 166 pg/mL — ABNORMAL HIGH (ref 15–65)

## 2018-04-27 DIAGNOSIS — H4053X1 Glaucoma secondary to other eye disorders, bilateral, mild stage: Secondary | ICD-10-CM | POA: Diagnosis not present

## 2018-05-03 DIAGNOSIS — Z6841 Body Mass Index (BMI) 40.0 and over, adult: Secondary | ICD-10-CM | POA: Diagnosis not present

## 2018-05-03 DIAGNOSIS — Z79899 Other long term (current) drug therapy: Secondary | ICD-10-CM | POA: Diagnosis not present

## 2018-05-03 DIAGNOSIS — M2550 Pain in unspecified joint: Secondary | ICD-10-CM | POA: Diagnosis not present

## 2018-05-03 DIAGNOSIS — M0579 Rheumatoid arthritis with rheumatoid factor of multiple sites without organ or systems involvement: Secondary | ICD-10-CM | POA: Diagnosis not present

## 2018-05-03 DIAGNOSIS — E790 Hyperuricemia without signs of inflammatory arthritis and tophaceous disease: Secondary | ICD-10-CM | POA: Diagnosis not present

## 2018-05-03 DIAGNOSIS — M150 Primary generalized (osteo)arthritis: Secondary | ICD-10-CM | POA: Diagnosis not present

## 2018-05-14 ENCOUNTER — Inpatient Hospital Stay (HOSPITAL_COMMUNITY)
Admission: EM | Admit: 2018-05-14 | Discharge: 2018-05-17 | DRG: 291 | Disposition: A | Discharge status: Home / Self Care | Payer: Medicare Other | Attending: Family Medicine | Admitting: Family Medicine

## 2018-05-14 ENCOUNTER — Encounter (HOSPITAL_COMMUNITY): Payer: Self-pay

## 2018-05-14 ENCOUNTER — Other Ambulatory Visit: Payer: Self-pay

## 2018-05-14 ENCOUNTER — Emergency Department (HOSPITAL_COMMUNITY): Payer: Medicare Other

## 2018-05-14 DIAGNOSIS — E876 Hypokalemia: Secondary | ICD-10-CM | POA: Diagnosis present

## 2018-05-14 DIAGNOSIS — Z7952 Long term (current) use of systemic steroids: Secondary | ICD-10-CM

## 2018-05-14 DIAGNOSIS — M069 Rheumatoid arthritis, unspecified: Secondary | ICD-10-CM | POA: Diagnosis not present

## 2018-05-14 DIAGNOSIS — I472 Ventricular tachycardia: Secondary | ICD-10-CM | POA: Diagnosis present

## 2018-05-14 DIAGNOSIS — Z885 Allergy status to narcotic agent status: Secondary | ICD-10-CM

## 2018-05-14 DIAGNOSIS — I130 Hypertensive heart and chronic kidney disease with heart failure and stage 1 through stage 4 chronic kidney disease, or unspecified chronic kidney disease: Principal | ICD-10-CM | POA: Diagnosis present

## 2018-05-14 DIAGNOSIS — I10 Essential (primary) hypertension: Secondary | ICD-10-CM | POA: Diagnosis present

## 2018-05-14 DIAGNOSIS — D631 Anemia in chronic kidney disease: Secondary | ICD-10-CM | POA: Diagnosis present

## 2018-05-14 DIAGNOSIS — I4821 Permanent atrial fibrillation: Secondary | ICD-10-CM | POA: Diagnosis present

## 2018-05-14 DIAGNOSIS — I482 Chronic atrial fibrillation: Secondary | ICD-10-CM | POA: Diagnosis not present

## 2018-05-14 DIAGNOSIS — Z8249 Family history of ischemic heart disease and other diseases of the circulatory system: Secondary | ICD-10-CM | POA: Diagnosis not present

## 2018-05-14 DIAGNOSIS — Z7901 Long term (current) use of anticoagulants: Secondary | ICD-10-CM | POA: Diagnosis not present

## 2018-05-14 DIAGNOSIS — Z79899 Other long term (current) drug therapy: Secondary | ICD-10-CM

## 2018-05-14 DIAGNOSIS — R0902 Hypoxemia: Secondary | ICD-10-CM

## 2018-05-14 DIAGNOSIS — E1122 Type 2 diabetes mellitus with diabetic chronic kidney disease: Secondary | ICD-10-CM | POA: Diagnosis present

## 2018-05-14 DIAGNOSIS — Z888 Allergy status to other drugs, medicaments and biological substances status: Secondary | ICD-10-CM

## 2018-05-14 DIAGNOSIS — I5033 Acute on chronic diastolic (congestive) heart failure: Secondary | ICD-10-CM | POA: Diagnosis present

## 2018-05-14 DIAGNOSIS — Z6841 Body Mass Index (BMI) 40.0 and over, adult: Secondary | ICD-10-CM

## 2018-05-14 DIAGNOSIS — E1121 Type 2 diabetes mellitus with diabetic nephropathy: Secondary | ICD-10-CM | POA: Diagnosis present

## 2018-05-14 DIAGNOSIS — R0602 Shortness of breath: Secondary | ICD-10-CM | POA: Diagnosis not present

## 2018-05-14 DIAGNOSIS — Z87891 Personal history of nicotine dependence: Secondary | ICD-10-CM

## 2018-05-14 DIAGNOSIS — I110 Hypertensive heart disease with heart failure: Secondary | ICD-10-CM | POA: Diagnosis not present

## 2018-05-14 DIAGNOSIS — N184 Chronic kidney disease, stage 4 (severe): Secondary | ICD-10-CM | POA: Diagnosis present

## 2018-05-14 DIAGNOSIS — I480 Paroxysmal atrial fibrillation: Secondary | ICD-10-CM | POA: Diagnosis present

## 2018-05-14 DIAGNOSIS — G4733 Obstructive sleep apnea (adult) (pediatric): Secondary | ICD-10-CM | POA: Diagnosis not present

## 2018-05-14 DIAGNOSIS — J9601 Acute respiratory failure with hypoxia: Secondary | ICD-10-CM | POA: Diagnosis not present

## 2018-05-14 DIAGNOSIS — R0600 Dyspnea, unspecified: Secondary | ICD-10-CM | POA: Diagnosis not present

## 2018-05-14 DIAGNOSIS — I509 Heart failure, unspecified: Secondary | ICD-10-CM | POA: Diagnosis not present

## 2018-05-14 DIAGNOSIS — I5030 Unspecified diastolic (congestive) heart failure: Secondary | ICD-10-CM | POA: Diagnosis not present

## 2018-05-14 DIAGNOSIS — D649 Anemia, unspecified: Secondary | ICD-10-CM | POA: Diagnosis not present

## 2018-05-14 DIAGNOSIS — I5031 Acute diastolic (congestive) heart failure: Secondary | ICD-10-CM

## 2018-05-14 LAB — I-STAT TROPONIN, ED: TROPONIN I, POC: 0 ng/mL (ref 0.00–0.08)

## 2018-05-14 LAB — BASIC METABOLIC PANEL
Anion gap: 11 (ref 5–15)
BUN: 30 mg/dL — AB (ref 6–20)
CALCIUM: 7.7 mg/dL — AB (ref 8.9–10.3)
CO2: 27 mmol/L (ref 22–32)
CREATININE: 3.18 mg/dL — AB (ref 0.44–1.00)
Chloride: 107 mmol/L (ref 101–111)
GFR, EST AFRICAN AMERICAN: 16 mL/min — AB (ref >60)
GFR, EST NON AFRICAN AMERICAN: 14 mL/min — AB (ref >60)
Glucose, Bld: 136 mg/dL — ABNORMAL HIGH (ref 65–99)
Potassium: 4 mmol/L (ref 3.5–5.1)
SODIUM: 145 mmol/L (ref 135–145)

## 2018-05-14 LAB — CBC WITH DIFFERENTIAL/PLATELET
ABS IMMATURE GRANULOCYTES: 0.1 10*3/uL (ref 0.0–0.1)
Basophils Absolute: 0.1 10*3/uL (ref 0.0–0.1)
Basophils Relative: 0 %
Eosinophils Absolute: 0.1 10*3/uL (ref 0.0–0.7)
Eosinophils Relative: 1 %
HEMATOCRIT: 36.6 % (ref 36.0–46.0)
Hemoglobin: 10.4 g/dL — ABNORMAL LOW (ref 12.0–15.0)
IMMATURE GRANULOCYTES: 1 %
LYMPHS ABS: 1.4 10*3/uL (ref 0.7–4.0)
Lymphocytes Relative: 11 %
MCH: 26.2 pg (ref 26.0–34.0)
MCHC: 28.4 g/dL — ABNORMAL LOW (ref 30.0–36.0)
MCV: 92.2 fL (ref 78.0–100.0)
MONOS PCT: 10 %
Monocytes Absolute: 1.2 10*3/uL — ABNORMAL HIGH (ref 0.1–1.0)
NEUTROS PCT: 77 %
Neutro Abs: 10.1 10*3/uL — ABNORMAL HIGH (ref 1.7–7.7)
PLATELETS: 259 10*3/uL (ref 150–400)
RBC: 3.97 MIL/uL (ref 3.87–5.11)
RDW: 16.9 % — ABNORMAL HIGH (ref 11.5–15.5)
WBC: 12.9 10*3/uL — AB (ref 4.0–10.5)

## 2018-05-14 LAB — BRAIN NATRIURETIC PEPTIDE: B Natriuretic Peptide: 869.6 pg/mL — ABNORMAL HIGH (ref 0.0–100.0)

## 2018-05-14 LAB — TSH: TSH: 1.301 u[IU]/mL (ref 0.350–4.500)

## 2018-05-14 LAB — MAGNESIUM: Magnesium: 1 mg/dL — ABNORMAL LOW (ref 1.7–2.4)

## 2018-05-14 LAB — PROTIME-INR
INR: 1.13
Prothrombin Time: 14.5 seconds (ref 11.4–15.2)

## 2018-05-14 LAB — GLUCOSE, CAPILLARY: GLUCOSE-CAPILLARY: 118 mg/dL — AB (ref 65–99)

## 2018-05-14 LAB — TROPONIN I

## 2018-05-14 MED ORDER — ACETAMINOPHEN 325 MG PO TABS: 650.0000 mg | ORAL_TABLET | Freq: Four times a day (QID) | ORAL | Status: DC | PRN

## 2018-05-14 MED ORDER — VITAMIN B-12 1000 MCG PO TABS
1000.0000 ug | ORAL_TABLET | Freq: Every day | ORAL | Status: DC
  Administered 2018-05-15 – 2018-05-17 (×3): 1000 ug via ORAL
  Filled 2018-05-14 (×3): qty 1

## 2018-05-14 MED ORDER — AMLODIPINE BESYLATE 5 MG PO TABS
5.0000 mg | ORAL_TABLET | Freq: Every day | ORAL | Status: DC
  Administered 2018-05-15 – 2018-05-17 (×3): 5 mg via ORAL
  Filled 2018-05-14 (×3): qty 1

## 2018-05-14 MED ORDER — FUROSEMIDE 10 MG/ML IJ SOLN
80.0000 mg | Freq: Once | INTRAMUSCULAR | Status: AC
  Administered 2018-05-14: 80 mg via INTRAVENOUS
  Filled 2018-05-14: qty 8

## 2018-05-14 MED ORDER — LEFLUNOMIDE 20 MG PO TABS
10.0000 mg | ORAL_TABLET | Freq: Every day | ORAL | Status: DC
  Administered 2018-05-15 – 2018-05-17 (×3): 10 mg via ORAL
  Filled 2018-05-14 (×3): qty 0.5

## 2018-05-14 MED ORDER — ONDANSETRON HCL 4 MG/2ML IJ SOLN
4.0000 mg | Freq: Four times a day (QID) | INTRAMUSCULAR | Status: DC | PRN
  Administered 2018-05-16: 4 mg via INTRAVENOUS
  Filled 2018-05-14: qty 2

## 2018-05-14 MED ORDER — PREDNISONE 5 MG PO TABS
5.0000 mg | ORAL_TABLET | Freq: Every day | ORAL | Status: DC
  Administered 2018-05-15 – 2018-05-17 (×3): 5 mg via ORAL
  Filled 2018-05-14 (×3): qty 1

## 2018-05-14 MED ORDER — HYDRALAZINE HCL 50 MG PO TABS
50.0000 mg | ORAL_TABLET | Freq: Three times a day (TID) | ORAL | Status: DC
  Administered 2018-05-14 – 2018-05-17 (×9): 50 mg via ORAL
  Filled 2018-05-14 (×10): qty 1

## 2018-05-14 MED ORDER — FUROSEMIDE 10 MG/ML IJ SOLN
40.0000 mg | Freq: Once | INTRAMUSCULAR | Status: DC
  Filled 2018-05-14: qty 4

## 2018-05-14 MED ORDER — CARVEDILOL 25 MG PO TABS
25.0000 mg | ORAL_TABLET | Freq: Two times a day (BID) | ORAL | Status: DC
  Administered 2018-05-14 – 2018-05-17 (×7): 25 mg via ORAL
  Filled 2018-05-14 (×8): qty 1

## 2018-05-14 MED ORDER — BRIMONIDINE TARTRATE-TIMOLOL 0.2-0.5 % OP SOLN
1.0000 [drp] | Freq: Two times a day (BID) | OPHTHALMIC | Status: DC
  Filled 2018-05-14: qty 5

## 2018-05-14 MED ORDER — RIVAROXABAN 15 MG PO TABS
15.0000 mg | ORAL_TABLET | Freq: Every day | ORAL | Status: DC
  Administered 2018-05-14 – 2018-05-17 (×4): 15 mg via ORAL
  Filled 2018-05-14 (×4): qty 1

## 2018-05-14 MED ORDER — TIMOLOL MALEATE 0.5 % OP SOLN
1.0000 [drp] | Freq: Two times a day (BID) | OPHTHALMIC | Status: DC
  Administered 2018-05-14 – 2018-05-17 (×6): 1 [drp] via OPHTHALMIC
  Filled 2018-05-14: qty 5

## 2018-05-14 MED ORDER — SODIUM CHLORIDE 0.9% FLUSH: 10.0000 mL | INTRAVENOUS | Status: DC | PRN

## 2018-05-14 MED ORDER — BRIMONIDINE TARTRATE 0.2 % OP SOLN
1.0000 [drp] | Freq: Two times a day (BID) | OPHTHALMIC | Status: DC
  Administered 2018-05-14 – 2018-05-17 (×6): 1 [drp] via OPHTHALMIC
  Filled 2018-05-14: qty 5

## 2018-05-14 MED ORDER — CALCIUM CARBONATE-VITAMIN D 500-200 MG-UNIT PO TABS
1.0000 | ORAL_TABLET | Freq: Two times a day (BID) | ORAL | Status: DC
  Administered 2018-05-14 – 2018-05-17 (×6): 1 via ORAL
  Filled 2018-05-14 (×6): qty 1

## 2018-05-14 MED ORDER — SIMVASTATIN 20 MG PO TABS
20.0000 mg | ORAL_TABLET | Freq: Every evening | ORAL | Status: DC
  Administered 2018-05-14 – 2018-05-17 (×4): 20 mg via ORAL
  Filled 2018-05-14 (×4): qty 1

## 2018-05-14 MED ORDER — SODIUM CHLORIDE 0.9% FLUSH
10.0000 mL | Freq: Two times a day (BID) | INTRAVENOUS | Status: DC
  Administered 2018-05-15 – 2018-05-17 (×5): 10 mL

## 2018-05-14 MED ORDER — ONDANSETRON HCL 4 MG PO TABS: 4.0000 mg | ORAL_TABLET | Freq: Four times a day (QID) | ORAL | Status: DC | PRN

## 2018-05-14 MED ORDER — PANTOPRAZOLE SODIUM 40 MG PO TBEC
40.0000 mg | DELAYED_RELEASE_TABLET | Freq: Every day | ORAL | Status: DC
  Administered 2018-05-15 – 2018-05-17 (×3): 40 mg via ORAL
  Filled 2018-05-14 (×3): qty 1

## 2018-05-14 MED ORDER — POTASSIUM CHLORIDE CRYS ER 20 MEQ PO TBCR
20.0000 meq | EXTENDED_RELEASE_TABLET | Freq: Every day | ORAL | Status: DC
Start: 2018-05-15 — End: 2018-05-17
  Administered 2018-05-15 – 2018-05-17 (×3): 20 meq via ORAL
  Filled 2018-05-14 (×3): qty 1

## 2018-05-14 MED ORDER — FUROSEMIDE 10 MG/ML IJ SOLN
60.0000 mg | Freq: Two times a day (BID) | INTRAMUSCULAR | Status: DC
  Administered 2018-05-15 – 2018-05-17 (×6): 60 mg via INTRAVENOUS
  Filled 2018-05-14 (×7): qty 6

## 2018-05-14 MED ORDER — FUROSEMIDE 10 MG/ML IJ SOLN
40.0000 mg | Freq: Once | INTRAMUSCULAR | Status: DC
Start: 2018-05-14 — End: 2018-05-14

## 2018-05-14 MED ORDER — ACETAMINOPHEN 650 MG RE SUPP: 650.0000 mg | Freq: Four times a day (QID) | RECTAL | Status: DC | PRN

## 2018-05-14 NOTE — ED Notes (Signed)
IV access attempted without success.

## 2018-05-14 NOTE — ED Notes (Signed)
IV team at the bedside. 

## 2018-05-14 NOTE — ED Notes (Signed)
Pt back from X-ray.  

## 2018-05-14 NOTE — ED Provider Notes (Signed)
Patient placed in Quick Look pathway, seen and evaluated   Chief Complaint: shortness of breath  HPI: 72 year old female with a history of CHF, CKD, diabetes, hypertension, A. fib on Eliquis, diastolic CHF (last echo on 01/28/2018 with EF of 55%) who presents emergency department today for shortness of breath.  Patient was seen today at [facility] in [place] for hypoxia with SPO2 73% on room air.  She reports she has been having mild shortness of breath at rest it is been worsened with minimal exertion over the last 1 week.  She also reports associated bilateral lower leg swelling.  Patient is on Lasix 40 mg twice daily and has been taking as prescribed.  Patient was placed on oxygen in the department with improvement of shortness of breath.  Patient reports she has not required oxygen in the past.  No chest pain, fever, URI symptoms or hemoptysis.  ROS:  Positive ROS: (+) Shortness of breath, dyspnea on exertion, orthopnea and bilateral lower leg swelling Negative ROS: (-) Fever, chest pain, hemoptysis  Physical Exam:   Gen: No distress  Neuro: Awake and Alert  Skin: Warm  Focused Exam: Heart tachycardic with irregularly irregular rhythm. Lungs with diminished breath sounds of the lower lung fields bilaterally.  Patient currently satting at 93% with good waveform on the monitor with 2.5 L on nasal cannula; 2+ bilateral lower extremity edema.  Dorsalis pedis and posterior tib pulses intact bilaterally.  Pulse (!) 116   Temp 98.9 F (37.2 C) (Oral)   Resp 20   LMP  (LMP Unknown)   SpO2 (!) 78%   Plan: Based on initial evaluation, labs ARE indicated and radiology studies ARE indicated.  Patient counseled on process, plan, and necessity for staying for completing the evaluation." Recommended room ASAP.  Initiation of care has begun. The patient has been counseled on the process, plan, and necessity for staying for the completion/evaluation, and the remainder of the medical screening examination     Lorelle Gibbs 05/14/18 1614    Jola Schmidt, MD 05/14/18 1739

## 2018-05-14 NOTE — ED Notes (Signed)
ED Provider at bedside. 

## 2018-05-14 NOTE — ED Triage Notes (Signed)
PT sent from [facility] for hypoxia. PT spo2 73% RA in the office there. Pt reports she has been having dyspnea on exertion x 1 week. Denies CP. Pt endorses CHF. Speaking in complete sentences at this time

## 2018-05-14 NOTE — H&P (Signed)
History and Physical    Melissa Bartlett VPX:[id number] DOB: [birth year] DOA: 05/14/2018  PCP: Donald Prose, MD  Patient coming from: Home.  Chief Complaint: Shortness of breath.  HPI: Melissa Bartlett is a 72 y.o. female with history of diastolic CHF, sleep apnea, chronic kidney disease stage IV, chronic anemia, diabetes mellitus per the chart not on medication, hypertension, rheumatoid arthritis presents to the ER with complaints of having fatigue weakness and shortness of breath.  Patient has been having the symptoms for last 1 week.  Had gone to her PCP and was referred to the ER.  Denies any chest pain.  Easily gets short of breath on exertion.  Has been noticing increasing lower extremity edema.  ED Course: The ER EKG shows mild elevated heart rate with A. fib chest x-ray shows congestion.  BNP was elevated.  Hemoglobin is at baseline.  Creatinine is mildly increased from baseline.  Patient was given 80 mg IV Lasix admitted for acute CHF.  Patient had been placed on midline IV access.  Review of Systems: As per HPI, rest all negative.   Past Medical History:  Diagnosis Date  . CHF (congestive heart failure) ([place])   . CKD (chronic kidney disease), stage IV ([place])   . Complication of anesthesia   . Diabetes ([place])   . HTN (hypertension)   . OSA (obstructive sleep apnea)    with AHI 11/hr with nocturnal hypoxemia   . PAF (paroxysmal atrial fibrillation) ([place])    s/p DCCV in 8/17  . RA (rheumatoid arthritis) ([place])     Past Surgical History:  Procedure Laterality Date  . ANKLE FUSION Right   . BREAST BIOPSY    . CARDIOVERSION N/A 08/15/2016   Procedure: CARDIOVERSION;  Surgeon: Jerline Pain, MD;  Location: [facility] ENDOSCOPY;  Service: Cardiovascular;  Laterality: N/A;  . CHOLECYSTECTOMY    . GALLBLADDER SURGERY    . HYSTEROTOMY     Patrtial  . HYSTEROTOMY     Complete  . KNEE ARTHROSCOPY    . LEFT HEART CATH AND CORONARY ANGIOGRAPHY N/A 11/16/2017   Procedure: LEFT HEART CATH  AND CORONARY ANGIOGRAPHY;  Surgeon: Martinique, Peter M, MD;  Location: [facility] CV LAB;  Service: Cardiovascular;  Laterality: N/A;     reports that she quit smoking about 2 years ago. She has never used smokeless tobacco. She reports that she does not drink alcohol or use drugs.  Allergies  Allergen Reactions  . Enalapril Other (See Comments)    Systemic reaction - jerking  . Codeine Itching  . Oxycodone Itching  . Vicodin [Hydrocodone-Acetaminophen] Itching    Family History  Problem Relation Age of Onset  . Hypertension Mother        Family HX Diabetes  . Stomach cancer Father     Prior to Admission medications   Medication Sig Start Date End Date Taking? Authorizing Provider  amLODipine (NORVASC) 5 MG tablet Take 1 tablet (5 mg total) by mouth daily. 03/12/18 06/10/18  Isaiah Serge, NP  Calcium Carbonate-Vitamin D3 (CALCIUM 600/VITAMIN D) 600-400 MG-UNIT TABS Take 1 tablet by mouth 2 (two) times daily.    [provider]  carvedilol (COREG) 25 MG tablet TAKE 1 TABLET BY MOUTH TWICE A DAY WITH A MEAL 09/03/17   Jerline Pain, MD  COMBIGAN 0.2-0.5 % ophthalmic solution Place 1 drop into both eyes 2 (two) times daily.  01/29/14   [provider]  Darbepoetin Alfa (ARANESP) 60 MCG/0.3ML SOSY injection Inject 60  mcg every 30 (thirty) days into the skin.    [provider]  diphenhydramine-acetaminophen (TYLENOL PM) 25-500 MG TABS tablet Take 1 tablet by mouth at bedtime.     [provider]  furosemide (LASIX) 40 MG tablet Take 1 tablet (40 mg total) by mouth 2 (two) times daily. 04/05/18   Sueanne Margarita, MD  hydrALAZINE (APRESOLINE) 50 MG tablet Take 1 tablet (50 mg total) by mouth 3 (three) times daily. 03/12/18 06/10/18  Isaiah Serge, NP  leflunomide (ARAVA) 10 MG tablet Take 10 mg by mouth daily.     [provider]  pantoprazole (PROTONIX) 40 MG tablet Take 1 tablet (40 mg total) by mouth daily. 10/29/17   Geradine Girt, DO  potassium  chloride SA (KLOR-CON M20) 20 MEQ tablet Take 1 tablet (20 mEq total) by mouth daily. 02/12/18   Jerline Pain, MD  predniSONE (DELTASONE) 5 MG tablet Take 5 mg by mouth daily.  08/22/17   [provider]  Probiotic Product (ALIGN PO) Take 1 tablet by mouth at bedtime.     [provider]  Rivaroxaban (XARELTO) 15 MG TABS tablet Take 1 tablet (15 mg total) daily with supper by mouth. 11/17/17   Martinique, Peter M, MD  simvastatin (ZOCOR) 20 MG tablet Take 20 mg by mouth every evening. 06/21/16   [provider]  vitamin B-12 (CYANOCOBALAMIN) 1000 MCG tablet Take 1,000 mcg by mouth daily.    [provider]    Physical Exam: Vitals:   05/14/18 1838 05/14/18 1856 05/14/18 1900 05/14/18 1930  BP: (!) 151/103 (!) 133/49 140/71 (!) 159/105  Pulse: (!) 105     Resp: 16  (!) 22 17  Temp: 98.7 F (37.1 C)     TempSrc: Oral     SpO2: 93%         Constitutional: Moderately built and nourished. Vitals:   05/14/18 1838 05/14/18 1856 05/14/18 1900 05/14/18 1930  BP: (!) 151/103 (!) 133/49 140/71 (!) 159/105  Pulse: (!) 105     Resp: 16  (!) 22 17  Temp: 98.7 F (37.1 C)     TempSrc: Oral     SpO2: 93%      Eyes: Anicteric no pallor. ENMT: No discharge from the ears eyes nose or mouth. Neck: No mass palpated no neck rigidity.  JVD appreciated. Respiratory: No rhonchi or crepitations. Cardiovascular: S1-S2 heard tachycardic. Abdomen: Soft nontender bowel sounds present. Musculoskeletal: Bilateral lower extremity edema present. Skin: No rash. Neurologic: Alert awake oriented to time place and person.  Moves all extremities. Psychiatric: Appears normal.  Normal affect.   Labs on Admission: I have personally reviewed following labs and imaging studies  CBC: Recent Labs  Lab 05/14/18 1610  WBC 12.9*  NEUTROABS 10.1*  HGB 10.4*  HCT 36.6  MCV 92.2  PLT 585   Basic Metabolic Panel: Recent Labs  Lab 05/14/18 1610  NA 145  K 4.0  CL 107  CO2 27   GLUCOSE 136*  BUN 30*  CREATININE 3.18*  CALCIUM 7.7*   GFR: CrCl cannot be calculated (Unknown ideal weight.). Liver Function Tests: No results for input(s): AST, ALT, ALKPHOS, BILITOT, PROT, ALBUMIN in the last 168 hours. No results for input(s): LIPASE, AMYLASE in the last 168 hours. No results for input(s): AMMONIA in the last 168 hours. Coagulation Profile: Recent Labs  Lab 05/14/18 1610  INR 1.13   Cardiac Enzymes: No results for input(s): CKTOTAL, CKMB, CKMBINDEX, TROPONINI in the last 168  hours. BNP (last 3 results) No results for input(s): PROBNP in the last 8760 hours. HbA1C: No results for input(s): HGBA1C in the last 72 hours. CBG: No results for input(s): GLUCAP in the last 168 hours. Lipid Profile: No results for input(s): CHOL, HDL, LDLCALC, TRIG, CHOLHDL, LDLDIRECT in the last 72 hours. Thyroid Function Tests: No results for input(s): TSH, T4TOTAL, FREET4, T3FREE, THYROIDAB in the last 72 hours. Anemia Panel: No results for input(s): VITAMINB12, FOLATE, FERRITIN, TIBC, IRON, RETICCTPCT in the last 72 hours. Urine analysis:    Component Value Date/Time   COLORURINE YELLOW 01/29/2018 0844   APPEARANCEUR HAZY (A) 01/29/2018 0844   LABSPEC 1.009 01/29/2018 0844   PHURINE 6.0 01/29/2018 0844   GLUCOSEU NEGATIVE 01/29/2018 0844   HGBUR MODERATE (A) 01/29/2018 0844   BILIRUBINUR NEGATIVE 01/29/2018 0844   KETONESUR NEGATIVE 01/29/2018 0844   PROTEINUR 100 (A) 01/29/2018 0844   UROBILINOGEN 0.2 04/28/2010 0304   NITRITE NEGATIVE 01/29/2018 0844   LEUKOCYTESUR MODERATE (A) 01/29/2018 0844   Sepsis Labs: @LABRCNTIP (procalcitonin:4,lacticidven:4) )No results found for this or any previous visit (from the past 240 hour(s)).   Radiological Exams on Admission: Dg Chest 2 View  Result Date: 05/14/2018 CLINICAL DATA:  Acute shortness of breath. EXAM: CHEST - 2 VIEW COMPARISON:  01/27/2018 and prior radiographs FINDINGS: This is a low volume film with  cardiomegaly again noted. Pulmonary vascular congestion is present. Pleural fluid within the RIGHT major and minor fissure noted as well as continued mild bibasilar atelectasis. There is no evidence of pneumothorax. No acute bony abnormalities identified. IMPRESSION: Cardiomegaly with pulmonary vascular congestion. Little interval change in RIGHT fissural pleural fluid and mild bibasilar atelectasis. Electronically Signed   By: Margarette Canada M.D.   On: 05/14/2018 19:31    EKG: Independently reviewed.  A. fib with elevated heart rate.  Assessment/Plan Principal Problem:   Acute respiratory failure with hypoxia (HCC) Active Problems:   Essential hypertension   Morbid obesity (HCC)   CKD (chronic kidney disease) stage 4, GFR 15-29 ml/min (HCC)   OSA (obstructive sleep apnea)   Permanent atrial fibrillation (HCC)   RA (rheumatoid arthritis) (HCC)   Acute diastolic CHF (congestive heart failure) ([place])    1. Acute respiratory failure with hypoxia likely secondary to acute on chronic diastolic CHF last EF measured was 50% in January 2019.  Patient was given Lasix 80 mg IV in the ER.  I have continued patient on 60 mg IV every 12.  Not on ARB or ACE inhibitor due to renal failure.  Closely follow intake output metabolic panel daily weights. 2. Hypertension on amlodipine Coreg and hydralazine. 3. Chronic kidney disease stage IV with mild worsening of creatinine.  Closely follow metabolic panel intake output. 4. Sleep apnea on CPAP. 5. Rheumatoid arthritis on immunosuppressant including prednisone and leflunomide. 6. A. fib rate was initially high when has been improved after patient became more comfortable.  Patient is on Coreg and Xarelto for chads 2 vasc score of 4. 7. Anemia likely from chronic disease follow CBC. 8. Diabetes mellitus per the chart.  Oral medication we will keep on sliding scale coverage.  Diabetic diet.   DVT prophylaxis: Xarelto. Code Status: Full code. Family Communication:  Patient's husband. Disposition Plan: Home. Consults called: None. Admission status: Inpatient.   Rise Patience MD [facility] Pager [phone].  If 7PM-7AM, please contact night-coverage [email or web address] Password [facility]  05/14/2018, 8:28 PM

## 2018-05-14 NOTE — ED Provider Notes (Signed)
[facility] EMERGENCY DEPARTMENT Provider Note   CSN: [id number] Arrival date & time: 05/14/18  1537     History   Chief Complaint Chief Complaint  Patient presents with  . Respiratory Distress    HPI Melissa Bartlett is a 72 y.o. female.  HPI  72 year old female with a history of CHF and chronic kidney disease along with paroxysmal atrial fibrillation presents with shortness of breath and hypoxia.  She got over a recent diarrheal illness within the last 5 days or so and since then has been feeling fatigued, especially with minimal exertion.  Does not say she specifically has shortness of breath but when she is at the doctor's office her daughter noted she appeared short of breath with minimal walking.  O2 saturations were 78% in the doctor's office.  She has not had any chest pain.  No significant cough.  She has chronic leg swelling but she does not feels any different than normal.  She has been compliant with her Lasix.  Past Medical History:  Diagnosis Date  . CHF (congestive heart failure) ([place])   . CKD (chronic kidney disease), stage IV ([place])   . Complication of anesthesia   . Diabetes ([place])   . HTN (hypertension)   . OSA (obstructive sleep apnea)    with AHI 11/hr with nocturnal hypoxemia   . PAF (paroxysmal atrial fibrillation) ([place])    s/p DCCV in 8/17  . RA (rheumatoid arthritis) [facility])     Patient Active Problem List   Diagnosis Date Noted  . AKI (acute kidney injury) ([place]) 02/10/2018  . Acute respiratory failure with hypoxia ([place]) 01/27/2018  . Permanent atrial fibrillation ([place]) 01/27/2018  . RA (rheumatoid arthritis) ([place]) 01/27/2018  . Acute diastolic CHF (congestive heart failure) ([place]) 01/27/2018  . Chest pain, rule out acute myocardial infarction 10/28/2017  . Syncope 03/14/2017  . Chest pain 03/14/2017  . OSA (obstructive sleep apnea)   . Paroxysmal atrial fibrillation ([place]) 07/31/2016  . CKD (chronic kidney disease) stage 4, GFR  15-29 ml/min (HCC) 07/31/2016  . Essential hypertension 07/09/2015  . Morbid obesity ([place]) 07/09/2015  . Diabetes ([place]) 03/07/2014    Past Surgical History:  Procedure Laterality Date  . ANKLE FUSION Right   . BREAST BIOPSY    . CARDIOVERSION N/A 08/15/2016   Procedure: CARDIOVERSION;  Surgeon: Jerline Pain, MD;  Location: [facility] ENDOSCOPY;  Service: Cardiovascular;  Laterality: N/A;  . CHOLECYSTECTOMY    . GALLBLADDER SURGERY    . HYSTEROTOMY     Patrtial  . HYSTEROTOMY     Complete  . KNEE ARTHROSCOPY    . LEFT HEART CATH AND CORONARY ANGIOGRAPHY N/A 11/16/2017   Procedure: LEFT HEART CATH AND CORONARY ANGIOGRAPHY;  Surgeon: Martinique, Peter M, MD;  Location: [facility] CV LAB;  Service: Cardiovascular;  Laterality: N/A;     OB History   None      Home Medications    Prior to Admission medications   Medication Sig Start Date End Date Taking? Authorizing Provider  amLODipine (NORVASC) 5 MG tablet Take 1 tablet (5 mg total) by mouth daily. 03/12/18 06/10/18  Isaiah Serge, NP  Calcium Carbonate-Vitamin D3 (CALCIUM 600/VITAMIN D) 600-400 MG-UNIT TABS Take 1 tablet by mouth 2 (two) times daily.    [provider]  carvedilol (COREG) 25 MG tablet TAKE 1 TABLET BY MOUTH TWICE A DAY WITH A MEAL 09/03/17   Jerline Pain, MD  COMBIGAN 0.2-0.5 % ophthalmic solution Place 1 drop  into both eyes 2 (two) times daily.  01/29/14   [provider]  Darbepoetin Alfa (ARANESP) 60 MCG/0.3ML SOSY injection Inject 60 mcg every 30 (thirty) days into the skin.    [provider]  diphenhydramine-acetaminophen (TYLENOL PM) 25-500 MG TABS tablet Take 1 tablet by mouth at bedtime.     [provider]  furosemide (LASIX) 40 MG tablet Take 1 tablet (40 mg total) by mouth 2 (two) times daily. 04/05/18   Sueanne Margarita, MD  hydrALAZINE (APRESOLINE) 50 MG tablet Take 1 tablet (50 mg total) by mouth 3 (three) times daily. 03/12/18 06/10/18  Isaiah Serge, NP  leflunomide (ARAVA)  10 MG tablet Take 10 mg by mouth daily.     [provider]  pantoprazole (PROTONIX) 40 MG tablet Take 1 tablet (40 mg total) by mouth daily. 10/29/17   Geradine Girt, DO  potassium chloride SA (KLOR-CON M20) 20 MEQ tablet Take 1 tablet (20 mEq total) by mouth daily. 02/12/18   Jerline Pain, MD  predniSONE (DELTASONE) 5 MG tablet Take 5 mg by mouth daily.  08/22/17   [provider]  Probiotic Product (ALIGN PO) Take 1 tablet by mouth at bedtime.     [provider]  Rivaroxaban (XARELTO) 15 MG TABS tablet Take 1 tablet (15 mg total) daily with supper by mouth. 11/17/17   Martinique, Peter M, MD  simvastatin (ZOCOR) 20 MG tablet Take 20 mg by mouth every evening. 06/21/16   [provider]  vitamin B-12 (CYANOCOBALAMIN) 1000 MCG tablet Take 1,000 mcg by mouth daily.    [provider]    Family History Family History  Problem Relation Age of Onset  . Hypertension Mother        Family HX Diabetes  . Stomach cancer Father     Social History Social History   Tobacco Use  . Smoking status: Former Smoker    Last attempt to quit: 04/08/2016    Years since quitting: 2.0  . Smokeless tobacco: Never Used  Substance Use Topics  . Alcohol use: No    Alcohol/week: 0.0 oz  . Drug use: No     Allergies   Enalapril; Codeine; Oxycodone; and Vicodin [hydrocodone-acetaminophen]   Review of Systems Review of Systems  Constitutional: Positive for fatigue. Negative for fever.  Respiratory: Positive for shortness of breath. Negative for cough.   Cardiovascular: Positive for leg swelling. Negative for chest pain.  All other systems reviewed and are negative.    Physical Exam Updated Vital Signs BP (!) 159/105   Pulse (!) 105   Temp 98.7 F (37.1 C) (Oral)   Resp 17   LMP  (LMP Unknown)   SpO2 93%   Physical Exam  Constitutional: She is oriented to person, place, and time. She appears well-developed and well-nourished. No distress.  obese    HENT:  Head: Normocephalic and atraumatic.  Right Ear: External ear normal.  Left Ear: External ear normal.  Nose: Nose normal.  Eyes: Right eye exhibits no discharge. Left eye exhibits no discharge.  Cardiovascular: Normal rate, regular rhythm and normal heart sounds.  Pulmonary/Chest: Effort normal. No accessory muscle usage. No tachypnea. No respiratory distress. She has decreased breath sounds in the right lower field and the left lower field.  Abdominal: Soft. There is no tenderness.  Musculoskeletal: She exhibits edema (mild, pitting, lower extremities (bilateral)).  Neurological: She is alert and oriented to person, place, and time.  Skin: Skin is warm and dry. She  is not diaphoretic.  Nursing note and vitals reviewed.    ED Treatments / Results  Labs (all labs ordered are listed, but only abnormal results are displayed) Labs Reviewed  BASIC METABOLIC PANEL - Abnormal; Notable for the following components:      Result Value   Glucose, Bld 136 (*)    BUN 30 (*)    Creatinine, Ser 3.18 (*)    Calcium 7.7 (*)    GFR calc non Af Amer 14 (*)    GFR calc Af Amer 16 (*)    All other components within normal limits  CBC WITH DIFFERENTIAL/PLATELET - Abnormal; Notable for the following components:   WBC 12.9 (*)    Hemoglobin 10.4 (*)    MCHC 28.4 (*)    RDW 16.9 (*)    Neutro Abs 10.1 (*)    Monocytes Absolute 1.2 (*)    All other components within normal limits  BRAIN NATRIURETIC PEPTIDE - Abnormal; Notable for the following components:   B Natriuretic Peptide 869.6 (*)    All other components within normal limits  PROTIME-INR  I-STAT TROPONIN, ED    EKG EKG Interpretation  Date/Time:  Friday May 14 2018 16:03:14 EDT Ventricular Rate:  120 PR Interval:    QRS Duration: 72 QT Interval:  312 QTC Calculation: 440 R Axis:   -11 Text Interpretation:  Atrial fibrillation with rapid ventricular response Anterior infarct , age undetermined Abnormal ECG No significant  change was found Confirmed by Jola Schmidt [phone]) on 05/14/2018 5:38:46 PM   Radiology Dg Chest 2 View  Result Date: 05/14/2018 CLINICAL DATA:  Acute shortness of breath. EXAM: CHEST - 2 VIEW COMPARISON:  01/27/2018 and prior radiographs FINDINGS: This is a low volume film with cardiomegaly again noted. Pulmonary vascular congestion is present. Pleural fluid within the RIGHT major and minor fissure noted as well as continued mild bibasilar atelectasis. There is no evidence of pneumothorax. No acute bony abnormalities identified. IMPRESSION: Cardiomegaly with pulmonary vascular congestion. Little interval change in RIGHT fissural pleural fluid and mild bibasilar atelectasis. Electronically Signed   By: Margarette Canada M.D.   On: 05/14/2018 19:31    Procedures Procedures (including critical care time)  Medications Ordered in ED Medications  furosemide (LASIX) injection 40 mg (has no administration in time range)  furosemide (LASIX) injection 40 mg (has no administration in time range)     Initial Impression / Assessment and Plan / ED Course  I have reviewed the triage vital signs and the nursing notes.  Pertinent labs & imaging results that were available during my care of the patient were reviewed by me and considered in my medical decision making (see chart for details).     Presentation is consistent with CHF exacerbation.  She is resting comfortably on supplemental nasal cannula oxygen.  No acute distress.  She will be given IV Lasix and will need admission to the hospitalist service.  Dr. Hal Hope to admit.  Final Clinical Impressions(s) / ED Diagnoses   Final diagnoses:  Hypoxia  Acute on chronic congestive heart failure, unspecified heart failure type [facility])    ED Discharge Orders    None       Sherwood Gambler, MD 05/14/18 2005

## 2018-05-15 LAB — CBC
HCT: 31.6 % — ABNORMAL LOW (ref 36.0–46.0)
HEMOGLOBIN: 9.1 g/dL — AB (ref 12.0–15.0)
MCH: 26.1 pg (ref 26.0–34.0)
MCHC: 28.8 g/dL — AB (ref 30.0–36.0)
MCV: 90.5 fL (ref 78.0–100.0)
Platelets: 231 10*3/uL (ref 150–400)
RBC: 3.49 MIL/uL — AB (ref 3.87–5.11)
RDW: 16.6 % — AB (ref 11.5–15.5)
WBC: 11.5 10*3/uL — ABNORMAL HIGH (ref 4.0–10.5)

## 2018-05-15 LAB — BASIC METABOLIC PANEL
ANION GAP: 12 (ref 5–15)
BUN: 33 mg/dL — ABNORMAL HIGH (ref 6–20)
CO2: 28 mmol/L (ref 22–32)
CREATININE: 2.86 mg/dL — AB (ref 0.44–1.00)
Calcium: 7.4 mg/dL — ABNORMAL LOW (ref 8.9–10.3)
Chloride: 106 mmol/L (ref 101–111)
GFR, EST AFRICAN AMERICAN: 18 mL/min — AB (ref >60)
GFR, EST NON AFRICAN AMERICAN: 16 mL/min — AB (ref >60)
GLUCOSE: 133 mg/dL — AB (ref 65–99)
Potassium: 3.6 mmol/L (ref 3.5–5.1)
Sodium: 146 mmol/L — ABNORMAL HIGH (ref 135–145)

## 2018-05-15 LAB — GLUCOSE, CAPILLARY
Glucose-Capillary: 105 mg/dL — ABNORMAL HIGH (ref 65–99)
Glucose-Capillary: 110 mg/dL — ABNORMAL HIGH (ref 65–99)
Glucose-Capillary: 179 mg/dL — ABNORMAL HIGH (ref 65–99)
Glucose-Capillary: 149 mg/dL — ABNORMAL HIGH (ref 65–99)

## 2018-05-15 MED ORDER — INSULIN ASPART 100 UNIT/ML ~~LOC~~ SOLN
0.0000 [IU] | Freq: Three times a day (TID) | SUBCUTANEOUS | Status: DC
  Administered 2018-05-17 (×2): 1 [IU] via SUBCUTANEOUS

## 2018-05-15 NOTE — Progress Notes (Signed)
Assumed care of pt. Lying in bed family at the bedside.

## 2018-05-15 NOTE — Progress Notes (Signed)
PROGRESS NOTE    Melissa Bartlett  NWG:[id number] DOB: 24-Dec-1946 DOA: 05/14/2018 PCP: Donald Prose, MD    Brief [place] y.o. female with history of diastolic CHF, sleep apnea, chronic kidney disease stage IV, chronic anemia, diabetes mellitus per the chart not on medication, hypertension, rheumatoid arthritis presents to the ER with complaints of having fatigue weakness and shortness of breath.  Patient has been having the symptoms for last 1 week.  Had gone to her PCP and was referred to the ER.  Denies any chest pain.  Easily gets short of breath on exertion.  Has been noticing increasing lower extremity edema.  ED Course: The ER EKG shows mild elevated heart rate with A. fib chest x-ray shows congestion.  BNP was elevated.  Hemoglobin is at baseline.  Creatinine is mildly increased from baseline.  Patient was given 80 mg IV Lasix admitted for acute CHF.  Patient had been placed on midline IV access.     Assessment & Plan:   Principal Problem:   Acute respiratory failure with hypoxia (HCC) Active Problems:   Essential hypertension   Morbid obesity (HCC)   CKD (chronic kidney disease) stage 4, GFR 15-29 ml/min (HCC)   OSA (obstructive sleep apnea)   Permanent atrial fibrillation (HCC)   RA (rheumatoid arthritis) (HCC)   Acute diastolic CHF (congestive heart failure) ([place]) 1. Acute respiratory failure with hypoxia likely secondary to acute on chronic diastolic CHF last EF measured was 50% in January 2019.  Chest x-ray consistent with fluid overload. Patient was given Lasix 80 mg IV in the ER.  I have continued patient on 60 mg IV every 12.  Not on ARB or ACE inhibitor due to renal failure.  Closely follow intake output metabolic panel daily weights.  BNP upon admission was 869.  Troponins have been negative.  She is negative over 1.6 L. 2. Hypertension on amlodipine Coreg and hydralazine. 3. Chronic kidney disease stage IV-creatinine improved to 2.86 from 3.18 to the time of  admission. 4. Sleep apnea on CPAP. 5. Rheumatoid arthritis on immunosuppressant including prednisone and leflunomide. 6. A. fib rate was initially high when has been improved after patient became more comfortable.  Patient is on Coreg and Xarelto for chads 2 vasc score of 4. 7. Anemia likely from chronic disease follow CBC. 8. Diabetes mellitus per the chart.  Oral medication we will keep on sliding scale coverage.  Diabetic diet.        DVT prophylaxis Xarelto  code Status: full Code Family Communication: Family available Disposition Plan: Think patient will be in hospital for 2 to 3 days Consultants: None   Procedures: None Antimicrobials: None Subjective: Feeling better denies any chest pain   Objective: Vitals:   05/14/18 2321 05/15/18 0500 05/15/18 0644 05/15/18 1118  BP: (!) 146/70  (!) 144/107 (!) 142/78  Pulse: 99  100 86  Resp:   20 18  Temp:   98.4 F (36.9 C) 98 F (36.7 C)  TempSrc:   Oral Oral  SpO2:   100% 93%  Weight:  109.9 kg (242 lb 3.2 oz)    Height:        Intake/Output Summary (Last 24 hours) at 05/15/2018 1354 Last data filed at 05/15/2018 1227 Gross per 24 hour  Intake 240 ml  Output 1925 ml  Net -1685 ml   Filed Weights   05/14/18 2207 05/15/18 0500  Weight: 111.4 kg (245 lb 8 oz) 109.9 kg (242 lb 3.2 oz)    Examination:  General exam: Appears calm and comfortable  Respiratory system: Clear to auscultation. Respiratory effort normal. Cardiovascular system: S1 & S2 heard, RRR. No JVD, murmurs, rubs, gallops or clicks. No pedal edema. Gastrointestinal system: Abdomen is nondistended, soft and nontender. No organomegaly or masses felt. Normal bowel sounds heard. Central nervous system: Alert and oriented. No focal neurological deficits. Extremities: Symmetric 5 x 5 power. Skin: No rashes, lesions or ulcers Psychiatry: Judgement and insight appear normal. Mood & affect appropriate.     Data Reviewed: I have personally reviewed  following labs and imaging studies  CBC: Recent Labs  Lab 05/14/18 1610 05/15/18 0348  WBC 12.9* 11.5*  NEUTROABS 10.1*  --   HGB 10.4* 9.1*  HCT 36.6 31.6*  MCV 92.2 90.5  PLT 259 761   Basic Metabolic Panel: Recent Labs  Lab 05/14/18 1610 05/14/18 2123 05/15/18 0348  NA 145  --  146*  K 4.0  --  3.6  CL 107  --  106  CO2 27  --  28  GLUCOSE 136*  --  133*  BUN 30*  --  33*  CREATININE 3.18*  --  2.86*  CALCIUM 7.7*  --  7.4*  MG  --  1.0*  --    GFR: Estimated Creatinine Clearance: 21.1 mL/min (A) (by C-G formula based on SCr of 2.86 mg/dL (H)). Liver Function Tests: No results for input(s): AST, ALT, ALKPHOS, BILITOT, PROT, ALBUMIN in the last 168 hours. No results for input(s): LIPASE, AMYLASE in the last 168 hours. No results for input(s): AMMONIA in the last 168 hours. Coagulation Profile: Recent Labs  Lab 05/14/18 1610  INR 1.13   Cardiac Enzymes: Recent Labs  Lab 05/14/18 2123  TROPONINI <0.03   BNP (last 3 results) No results for input(s): PROBNP in the last 8760 hours. HbA1C: No results for input(s): HGBA1C in the last 72 hours. CBG: Recent Labs  Lab 05/14/18 2210 05/15/18 0743 05/15/18 1203  GLUCAP 118* 105* 110*   Lipid Profile: No results for input(s): CHOL, HDL, LDLCALC, TRIG, CHOLHDL, LDLDIRECT in the last 72 hours. Thyroid Function Tests: Recent Labs    05/14/18 2123  TSH 1.301   Anemia Panel: No results for input(s): VITAMINB12, FOLATE, FERRITIN, TIBC, IRON, RETICCTPCT in the last 72 hours. Sepsis Labs: No results for input(s): PROCALCITON, LATICACIDVEN in the last 168 hours.  No results found for this or any previous visit (from the past 240 hour(s)).       Radiology Studies: Dg Chest 2 View  Result Date: 05/14/2018 CLINICAL DATA:  Acute shortness of breath. EXAM: CHEST - 2 VIEW COMPARISON:  01/27/2018 and prior radiographs FINDINGS: This is a low volume film with cardiomegaly again noted. Pulmonary vascular  congestion is present. Pleural fluid within the RIGHT major and minor fissure noted as well as continued mild bibasilar atelectasis. There is no evidence of pneumothorax. No acute bony abnormalities identified. IMPRESSION: Cardiomegaly with pulmonary vascular congestion. Little interval change in RIGHT fissural pleural fluid and mild bibasilar atelectasis. Electronically Signed   By: Margarette Canada M.D.   On: 05/14/2018 19:31        Scheduled Meds: . amLODipine  5 mg Oral Daily  . timolol  1 drop Both Eyes BID   And  . brimonidine  1 drop Both Eyes BID  . calcium-vitamin D  1 tablet Oral BID  . carvedilol  25 mg Oral BID WC  . furosemide  60 mg Intravenous Q12H  . hydrALAZINE  50 mg Oral TID  .  insulin aspart  0-9 Units Subcutaneous TID WC  . leflunomide  10 mg Oral Daily  . pantoprazole  40 mg Oral Daily  . potassium chloride SA  20 mEq Oral Daily  . predniSONE  5 mg Oral Daily  . Rivaroxaban  15 mg Oral Q supper  . simvastatin  20 mg Oral QPM  . sodium chloride flush  10-40 mL Intracatheter Q12H  . vitamin B-12  1,000 mcg Oral Daily   Continuous Infusions:   LOS: 1 day     Georgette Shell, MD [facility] If 7PM-7AM, please contact night-coverage [email or web address] Password [email or web address] 05/15/2018, 1:54 PM

## 2018-05-15 NOTE — Plan of Care (Signed)
  Problem: Activity: Goal: Risk for activity intolerance will decrease Outcome: Completed/Met   Problem: Nutrition: Goal: Adequate nutrition will be maintained Outcome: Completed/Met   Problem: Coping: Goal: Level of anxiety will decrease Outcome: Completed/Met   Problem: Elimination: Goal: Will not experience complications related to bowel motility Outcome: Completed/Met Goal: Will not experience complications related to urinary retention Outcome: Completed/Met   Problem: Pain Managment: Goal: General experience of comfort will improve Outcome: Completed/Met   Problem: Safety: Goal: Ability to remain free from injury will improve Outcome: Completed/Met   Problem: Skin Integrity: Goal: Risk for impaired skin integrity will decrease Outcome: Completed/Met

## 2018-05-15 NOTE — Plan of Care (Signed)
  Problem: Nutrition: Goal: Adequate nutrition will be maintained Outcome: Completed/Met   Problem: Coping: Goal: Level of anxiety will decrease Outcome: Completed/Met   Problem: Elimination: Goal: Will not experience complications related to bowel motility Outcome: Completed/Met Goal: Will not experience complications related to urinary retention Outcome: Completed/Met   

## 2018-05-15 NOTE — Progress Notes (Signed)
Placed patient on home CPAP wearing 3L [place] at this time, RCP will continue to monitor, patient tolerating well, SPO2 94%.

## 2018-05-15 NOTE — Care Management Note (Signed)
Case Management Note  Patient Details  Name: Melissa Bartlett MRN: [id number] Date of Birth: 1946/09/15  Subjective/Objective:  Acute Resp Failure with Hypoxia                Action/Plan:  PCP: Donald Prose, MD; has private insurance with Medicare/ BCBS with prescription drug coverage; patient lives at home; awaiting for Physical Therapy eval for disposition needs; CM will continue to follow for progression of care.   Expected Discharge Date:    possibly 05/18/2018              Expected Discharge Plan:  Melissa Bartlett  In-House Referral:   [facility]  Discharge planning Services  CM Consult   Status of Service:  In process, will continue to follow  Sherrilyn Rist [phone] 05/15/2018, 8:54 AM

## 2018-05-15 NOTE — Progress Notes (Signed)
Patient refused bed alarm. Will continue to monitor patient. 

## 2018-05-15 NOTE — Plan of Care (Signed)
  Problem: Nutrition: Goal: Adequate nutrition will be maintained Outcome: Completed/Met   Problem: Coping: Goal: Level of anxiety will decrease Outcome: Completed/Met   Problem: Elimination: Goal: Will not experience complications related to bowel motility Outcome: Completed/Met Goal: Will not experience complications related to urinary retention Outcome: Completed/Met   Problem: Pain Managment: Goal: General experience of comfort will improve Outcome: Completed/Met   Problem: Safety: Goal: Ability to remain free from injury will improve Outcome: Completed/Met   

## 2018-05-16 DIAGNOSIS — M069 Rheumatoid arthritis, unspecified: Secondary | ICD-10-CM

## 2018-05-16 DIAGNOSIS — G4733 Obstructive sleep apnea (adult) (pediatric): Secondary | ICD-10-CM

## 2018-05-16 DIAGNOSIS — I10 Essential (primary) hypertension: Secondary | ICD-10-CM

## 2018-05-16 DIAGNOSIS — N184 Chronic kidney disease, stage 4 (severe): Secondary | ICD-10-CM

## 2018-05-16 LAB — BASIC METABOLIC PANEL
Anion gap: 13 (ref 5–15)
BUN: 39 mg/dL — AB (ref 6–20)
CHLORIDE: 102 mmol/L (ref 101–111)
CO2: 30 mmol/L (ref 22–32)
Calcium: 7.6 mg/dL — ABNORMAL LOW (ref 8.9–10.3)
Creatinine, Ser: 3.41 mg/dL — ABNORMAL HIGH (ref 0.44–1.00)
GFR calc Af Amer: 15 mL/min — ABNORMAL LOW (ref >60)
GFR calc non Af Amer: 13 mL/min — ABNORMAL LOW (ref >60)
GLUCOSE: 93 mg/dL (ref 65–99)
POTASSIUM: 3.6 mmol/L (ref 3.5–5.1)
Sodium: 145 mmol/L (ref 135–145)

## 2018-05-16 LAB — GLUCOSE, CAPILLARY
Glucose-Capillary: 95 mg/dL (ref 65–99)
Glucose-Capillary: 144 mg/dL — ABNORMAL HIGH (ref 65–99)
Glucose-Capillary: 180 mg/dL — ABNORMAL HIGH (ref 65–99)
Glucose-Capillary: 173 mg/dL — ABNORMAL HIGH (ref 65–99)

## 2018-05-16 LAB — MAGNESIUM
MAGNESIUM: 2.3 mg/dL (ref 1.7–2.4)
Magnesium: 1 mg/dL — ABNORMAL LOW (ref 1.7–2.4)

## 2018-05-16 LAB — HEMOGLOBIN A1C
Hgb A1c MFr Bld: 5.9 % — ABNORMAL HIGH (ref 4.8–5.6)
Mean Plasma Glucose: 122.63 mg/dL

## 2018-05-16 MED ORDER — ALUM & MAG HYDROXIDE-SIMETH 200-200-20 MG/5ML PO SUSP
30.0000 mL | Freq: Four times a day (QID) | ORAL | Status: DC | PRN
  Administered 2018-05-16: 30 mL via ORAL
  Filled 2018-05-16: qty 30

## 2018-05-16 MED ORDER — MAGNESIUM SULFATE 4 GM/100ML IV SOLN
4.0000 g | Freq: Once | INTRAVENOUS | Status: AC
  Administered 2018-05-16: 4 g via INTRAVENOUS
  Filled 2018-05-16 (×3): qty 100

## 2018-05-16 NOTE — Progress Notes (Signed)
PT Cancellation Note  Patient Details Name: Melissa Bartlett MRN: [id number] DOB: November 04, 1946   Cancelled Treatment:    Reason Eval/Treat Not Completed: PT screened, no needs identified, will sign off.  Pt states she is ambulatory in room without problems or concerns, uses no devices now or at baseline, and has family at d/c.  Pt encouraged to be active daily to assist with medical recovery.  No other PT needs.     Kearney Hard [facility] 05/16/2018, 10:47 AM

## 2018-05-16 NOTE — Progress Notes (Signed)
Patient vomited after taking her medications, Zofran IV given and patient reported feeling better. Will continue to monitor.

## 2018-05-16 NOTE — Progress Notes (Signed)
[facility]  PROGRESS NOTE  Melissa Bartlett XHB:[id number] DOB: [birth year] DOA: 05/14/2018 PCP: Donald Prose, MD   Brief HPI:    72 y.o.femalewithhistory of diastolic CHF, sleep apnea, chronic kidney disease stage IV, chronic anemia, diabetes mellitus per the chart not on medication, hypertension, rheumatoid arthritis presents to the ER with complaints of having fatigue weakness and shortness of breath. Patient has been having the symptoms for last 1 week. Had gone to her PCP and was referred to the ER. Denies any chest pain. Easily gets short of breath on exertion. Has been noticing increasing lower extremity edema.    Subjective   Patient seen and examined, denies chest pain or shortness of breath.  Serum magnesium level is low this morning.  Had 19 beat run of V. tach   Assessment/Plan:     1. Acute respiratory failure with hypoxia-secondary to acute on chronic diastolic CHF, last EF 81% in January 2019.  Chest x-ray consistent with pulmonary edema.  Patient received Lasix 60 mg IV every 12 hours.  Net -1.58 L.  Weight loss of 6 pounds. 2. Nonsustained V. Tach-patient had 19 beat run of nonsustained V. tach this morning.  Likely from severe hypomagnesemia.  Serum magnesium is 1.0.  Will give 4 g of magnesium sulfate and replace magnesium level today.  Continue to monitor on telemetry. 3. Hypertension-blood pressure stable, continue Coreg, amlodipine 4. Chronic kidney disease stage IV-creatinine at baseline, today creatinine is 3.41.  Follow BMP in a.m. 5. Sleep apnea-continue CPAP at bedtime 6. Rheumatoid arthritis-continue prednisone, Arava  7. Atrial fibrillation-rate is controlled, continue Coreg, Xarelto for anticoagulation.CHA2DS2VASc score is 4 8. Anemia-from chronic kidney disease, hemoglobin is stable at 9.1  9. Diabetes mellitus, type II-with nephropathy, controlled.  No acute complications.  Check hemoglobin A1c.  Last A1c from 2012 was 6.9.  Continue sliding  scale insulin with NovoLog.    DVT prophylaxis: Xarelto  Code Status: Full code  Family Communication: No family at bedside  Disposition Plan: likely home when medically ready for discharge   Consultants:  None  Procedures:  None   Antibiotics:   Anti-infectives (From admission, onward)   None       Objective   Vitals:   05/15/18 1920 05/16/18 0456 05/16/18 0840 05/16/18 1141  BP: (!) 149/84 (!) 161/96 (!) 154/82 130/72  Pulse: 99 76 (!) 105 (!) 107  Resp: 16 18  18   Temp: 98.5 F (36.9 C) 98.3 F (36.8 C)  97.9 F (36.6 C)  TempSrc: Oral Oral  Oral  SpO2: 100% 100% (!) 85% 95%  Weight:  108.5 kg (239 lb 3.2 oz)    Height:        Intake/Output Summary (Last 24 hours) at 05/16/2018 1245 Last data filed at 05/16/2018 1136 Gross per 24 hour  Intake 1258 ml  Output 1503 ml  Net -245 ml   Filed Weights   05/14/18 2207 05/15/18 0500 05/16/18 0456  Weight: 111.4 kg (245 lb 8 oz) 109.9 kg (242 lb 3.2 oz) 108.5 kg (239 lb 3.2 oz)     Physical Examination:    General: Appears in no acute distress  Cardiovascular: S1-S2, regular, no murmurs auscultated  Respiratory: Clear to auscultation bilaterally, no wheezing or crackles auscultated  Abdomen: Soft, nondistended, nontender.  No organomegaly.  Extremities: No edema noted in the lower extremities  Neurologic: Alert and oriented x3, no focal deficit     Data Reviewed: I have personally reviewed following labs and imaging studies  CBG: Recent  Labs  Lab 05/15/18 1203 05/15/18 1635 05/15/18 2107 05/16/18 0746 05/16/18 1139  GLUCAP 110* 179* 149* 95 144*    CBC: Recent Labs  Lab 05/14/18 1610 05/15/18 0348  WBC 12.9* 11.5*  NEUTROABS 10.1*  --   HGB 10.4* 9.1*  HCT 36.6 31.6*  MCV 92.2 90.5  PLT 259 917    Basic Metabolic Panel: Recent Labs  Lab 05/14/18 1610 05/14/18 2123 05/15/18 0348 05/16/18 0356  NA 145  --  146* 145  K 4.0  --  3.6 3.6  CL 107  --  106 102  CO2 27   --  28 30  GLUCOSE 136*  --  133* 93  BUN 30*  --  33* 39*  CREATININE 3.18*  --  2.86* 3.41*  CALCIUM 7.7*  --  7.4* 7.6*  MG  --  1.0*  --  1.0*    No results found for this or any previous visit (from the past 240 hour(s)).   Liver Function Tests: No results for input(s): AST, ALT, ALKPHOS, BILITOT, PROT, ALBUMIN in the last 168 hours. No results for input(s): LIPASE, AMYLASE in the last 168 hours. No results for input(s): AMMONIA in the last 168 hours.  Cardiac Enzymes: Recent Labs  Lab 05/14/18 2123  TROPONINI <0.03   BNP (last 3 results) Recent Labs    01/27/18 1429 05/14/18 1610  BNP 739.5* 869.6*    ProBNP (last 3 results) No results for input(s): PROBNP in the last 8760 hours.    Studies: Dg Chest 2 View  Result Date: 05/14/2018 CLINICAL DATA:  Acute shortness of breath. EXAM: CHEST - 2 VIEW COMPARISON:  01/27/2018 and prior radiographs FINDINGS: This is a low volume film with cardiomegaly again noted. Pulmonary vascular congestion is present. Pleural fluid within the RIGHT major and minor fissure noted as well as continued mild bibasilar atelectasis. There is no evidence of pneumothorax. No acute bony abnormalities identified. IMPRESSION: Cardiomegaly with pulmonary vascular congestion. Little interval change in RIGHT fissural pleural fluid and mild bibasilar atelectasis. Electronically Signed   By: Margarette Canada M.D.   On: 05/14/2018 19:31    Scheduled Meds: . amLODipine  5 mg Oral Daily  . timolol  1 drop Both Eyes BID   And  . brimonidine  1 drop Both Eyes BID  . calcium-vitamin D  1 tablet Oral BID  . carvedilol  25 mg Oral BID WC  . furosemide  60 mg Intravenous Q12H  . hydrALAZINE  50 mg Oral TID  . insulin aspart  0-9 Units Subcutaneous TID WC  . leflunomide  10 mg Oral Daily  . pantoprazole  40 mg Oral Daily  . potassium chloride SA  20 mEq Oral Daily  . predniSONE  5 mg Oral Daily  . Rivaroxaban  15 mg Oral Q supper  . simvastatin  20 mg Oral  QPM  . sodium chloride flush  10-40 mL Intracatheter Q12H  . vitamin B-12  1,000 mcg Oral Daily      Time spent: 35 minutes  [facility] Pager [phone]. If 7PM-7AM, please contact night-coverage at [email or web address], Office  [phone]  password [email or web address]  05/16/2018, 12:45 PM  LOS: 2 days

## 2018-05-17 DIAGNOSIS — I509 Heart failure, unspecified: Secondary | ICD-10-CM

## 2018-05-17 LAB — BASIC METABOLIC PANEL
ANION GAP: 12 (ref 5–15)
BUN: 49 mg/dL — ABNORMAL HIGH (ref 6–20)
CHLORIDE: 100 mmol/L — AB (ref 101–111)
CO2: 28 mmol/L (ref 22–32)
Calcium: 7.8 mg/dL — ABNORMAL LOW (ref 8.9–10.3)
Creatinine, Ser: 3.74 mg/dL — ABNORMAL HIGH (ref 0.44–1.00)
GFR calc Af Amer: 13 mL/min — ABNORMAL LOW (ref >60)
GFR calc non Af Amer: 11 mL/min — ABNORMAL LOW (ref >60)
Glucose, Bld: 160 mg/dL — ABNORMAL HIGH (ref 65–99)
POTASSIUM: 4.7 mmol/L (ref 3.5–5.1)
SODIUM: 140 mmol/L (ref 135–145)

## 2018-05-17 LAB — COMPREHENSIVE METABOLIC PANEL
ALBUMIN: 2.3 g/dL — AB (ref 3.5–5.0)
ALT: 13 U/L — ABNORMAL LOW (ref 14–54)
ANION GAP: 11 (ref 5–15)
AST: 9 U/L — ABNORMAL LOW (ref 15–41)
Alkaline Phosphatase: 56 U/L (ref 38–126)
BUN: 41 mg/dL — ABNORMAL HIGH (ref 6–20)
CO2: 27 mmol/L (ref 22–32)
Calcium: 6.5 mg/dL — ABNORMAL LOW (ref 8.9–10.3)
Chloride: 106 mmol/L (ref 101–111)
Creatinine, Ser: 3.51 mg/dL — ABNORMAL HIGH (ref 0.44–1.00)
GFR calc non Af Amer: 12 mL/min — ABNORMAL LOW (ref >60)
GFR, EST AFRICAN AMERICAN: 14 mL/min — AB (ref >60)
GLUCOSE: 91 mg/dL (ref 65–99)
POTASSIUM: 3.2 mmol/L — AB (ref 3.5–5.1)
SODIUM: 144 mmol/L (ref 135–145)
TOTAL PROTEIN: 5 g/dL — AB (ref 6.5–8.1)
Total Bilirubin: 0.4 mg/dL (ref 0.3–1.2)

## 2018-05-17 LAB — GLUCOSE, CAPILLARY
GLUCOSE-CAPILLARY: 138 mg/dL — AB (ref 65–99)
Glucose-Capillary: 104 mg/dL — ABNORMAL HIGH (ref 65–99)

## 2018-05-17 MED ORDER — FUROSEMIDE 20 MG PO TABS: 60.0000 mg | ORAL_TABLET | Freq: Two times a day (BID) | ORAL | 11 refills | Status: DC

## 2018-05-17 MED ORDER — POTASSIUM CHLORIDE CRYS ER 20 MEQ PO TBCR
40.0000 meq | EXTENDED_RELEASE_TABLET | ORAL | Status: DC
  Administered 2018-05-17: 40 meq via ORAL
  Filled 2018-05-17: qty 2

## 2018-05-17 NOTE — Progress Notes (Signed)
Patient has home CPAP unit. 2L was placed inline and patient will place mask on when ready. RT will continue to monitor.

## 2018-05-17 NOTE — Discharge Summary (Addendum)
Physician Discharge Summary  Melissa Bartlett BDZ:[id number] DOB: 1946-12-29 DOA: 05/14/2018  PCP: Melissa Prose, MD  Admit date: 05/14/2018 Discharge date: 05/17/2018  Time spent: 45 minutes  Recommendations for Outpatient Follow-up:  1. Follow-up nephrology in 2 weeks 2. Follow-up PCP in 1 week to check BMP for  Potassium and renal function   Discharge Diagnoses:  Principal Problem:   Acute respiratory failure with hypoxia ([place]) Active Problems:   Essential hypertension   Morbid obesity (HCC)   CKD (chronic kidney disease) stage 4, GFR 15-29 ml/min (HCC)   OSA (obstructive sleep apnea)   Permanent atrial fibrillation (HCC)   RA (rheumatoid arthritis) (HCC)   Acute diastolic CHF (congestive heart failure) ([place])   Discharge Condition: Stable  Diet recommendation: Heart healthy diet  Filed Weights   05/15/18 0500 05/16/18 0456 05/17/18 0457  Weight: 109.9 kg (242 lb 3.2 oz) 108.5 kg (239 lb 3.2 oz) 109.3 kg (241 lb)    History of present illness:   72 y.o.femalewithhistory of diastolic CHF, sleep apnea, chronic kidney disease stage IV, chronic anemia, diabetes mellitus per the chart not on medication, hypertension, rheumatoid arthritis presents to the ER with complaints of having fatigue weakness and shortness of breath. Patient has been having the symptoms for last 1 week. Had gone to her PCP and was referred to the ER. Denies any chest pain. Easily gets short of breath on exertion. Has been noticing increasing lower extremity edema.    Hospital Course:  1. Acute respiratory failure with hypoxia-secondary to acute on chronic diastolic CHF, last EF 34% in January 2019.  Chest x-ray consistent with pulmonary edema.  Patient received Lasix 60 mg IV every 12 hours.  Net -1.96 L.  Weight loss of 4 pounds. 2. Nonsustained V. Tach-resolved, patient had 19 beat run of nonsustained V. tach yesterday morning.  Likely from severe hypomagnesemia.  Serum magnesium was 1.0. Patient  was given 4 g of magnesium sulfate, repeat magnesium is 2.3 3. Hypertension-blood pressure stable, continue Coreg, amlodipine 4. Hypokalemia-potassium is 3.2, will replace potassium today and recheck before discharge. Repeat potasium is 4.7 5. Chronic kidney disease stage IV-creatinine at baseline, today creatinine is 3.41.  Called and discussed with nephrology Dr. Lorrene Reid, she recommended to increase the dose of Lasix to 60 mg twice a day.  Patient can follow-up with nephrology as outpatient.Recheck BMP in one week. 6. Sleep apnea-continue CPAP at bedtime 7. Rheumatoid arthritis-continue prednisone, [place]  8. Atrial fibrillation-rate is controlled, continue Coreg, Xarelto for anticoagulation. CHA2DS2VASc score is 4 9. Anemia-from chronic kidney disease, hemoglobin is stable at 9.1  10. Diabetes mellitus, type II-with nephropathy, controlled.  No acute complications.    Hemoglobin A1c is 5.9 .      Procedures:  None   Consultations:  None   Discharge Exam: Vitals:   05/17/18 0457 05/17/18 1250  BP: 123/77 120/75  Pulse: 64 79  Resp: 18 20  Temp: 97.9 F (36.6 C) 98.1 F (36.7 C)  SpO2: 98% 97%    General: Appears in no acute distress Cardiovascular: S1-S2, regular Respiratory: Clear to auscultation bilaterally  Discharge Instructions    Allergies as of 05/17/2018      Reactions   Enalapril Other (See Comments)   Systemic reaction - jerking   Codeine Itching   Oxycodone Itching   Vicodin [hydrocodone-acetaminophen] Itching      Medication List    TAKE these medications   ALIGN PO Take 1 tablet by mouth at bedtime.   amLODipine 5 MG tablet  Commonly known as:  NORVASC Take 1 tablet (5 mg total) by mouth daily.   CALCIUM 600/VITAMIN D 600-400 MG-UNIT Tabs Generic drug:  Calcium Carbonate-Vitamin D3 Take 1 tablet by mouth 2 (two) times daily.   carvedilol 25 MG tablet Commonly known as:  COREG TAKE 1 TABLET BY MOUTH TWICE A DAY WITH A MEAL   COMBIGAN  0.2-0.5 % ophthalmic solution Generic drug:  brimonidine-timolol Place 1 drop into both eyes 2 (two) times daily.   Darbepoetin Alfa 60 MCG/0.3ML Sosy injection Commonly known as:  ARANESP Inject 60 mcg every 30 (thirty) days into the skin.   diphenhydramine-acetaminophen 25-500 MG Tabs tablet Commonly known as:  TYLENOL PM Take 1 tablet by mouth at bedtime.   furosemide 20 MG tablet Commonly known as:  LASIX Take 3 tablets (60 mg total) by mouth 2 (two) times daily. What changed:    medication strength  how much to take   hydrALAZINE 50 MG tablet Commonly known as:  APRESOLINE Take 1 tablet (50 mg total) by mouth 3 (three) times daily.   leflunomide 10 MG tablet Commonly known as:  ARAVA Take 10 mg by mouth daily.   pantoprazole 40 MG tablet Commonly known as:  PROTONIX Take 1 tablet (40 mg total) by mouth daily.   potassium chloride SA 20 MEQ tablet Commonly known as:  KLOR-CON M20 Take 1 tablet (20 mEq total) by mouth daily.   predniSONE 5 MG tablet Commonly known as:  DELTASONE Take 5 mg by mouth daily.   PRESCRIPTION MEDICATION Inhale into the lungs at bedtime. CPAP   promethazine 25 MG tablet Commonly known as:  PHENERGAN Take 25 mg by mouth every 6 (six) hours as needed for nausea or vomiting.   Rivaroxaban 15 MG Tabs tablet Commonly known as:  XARELTO Take 1 tablet (15 mg total) daily with supper by mouth. What changed:  when to take this   simvastatin 20 MG tablet Commonly known as:  ZOCOR Take 20 mg by mouth at bedtime.   vitamin B-12 1000 MCG tablet Commonly known as:  CYANOCOBALAMIN Take 1,000 mcg by mouth daily.      Allergies  Allergen Reactions  . Enalapril Other (See Comments)    Systemic reaction - jerking  . Codeine Itching  . Oxycodone Itching  . Vicodin [Hydrocodone-Acetaminophen] Itching      The results of significant diagnostics from this hospitalization (including imaging, microbiology, ancillary and laboratory) are  listed below for reference.    Significant Diagnostic Studies: Dg Chest 2 View  Result Date: 05/14/2018 CLINICAL DATA:  Acute shortness of breath. EXAM: CHEST - 2 VIEW COMPARISON:  01/27/2018 and prior radiographs FINDINGS: This is a low volume film with cardiomegaly again noted. Pulmonary vascular congestion is present. Pleural fluid within the RIGHT major and minor fissure noted as well as continued mild bibasilar atelectasis. There is no evidence of pneumothorax. No acute bony abnormalities identified. IMPRESSION: Cardiomegaly with pulmonary vascular congestion. Little interval change in RIGHT fissural pleural fluid and mild bibasilar atelectasis. Electronically Signed   By: Margarette Canada M.D.   On: 05/14/2018 19:31    Microbiology: No results found for this or any previous visit (from the past 240 hour(s)).   Labs: Basic Metabolic Panel: Recent Labs  Lab 05/14/18 1610 05/14/18 2123 05/15/18 0348 05/16/18 0356 05/16/18 1747 05/17/18 0440  NA 145  --  146* 145  --  144  K 4.0  --  3.6 3.6  --  3.2*  CL 107  --  106 102  --  106  CO2 27  --  28 30  --  27  GLUCOSE 136*  --  133* 93  --  91  BUN 30*  --  33* 39*  --  41*  CREATININE 3.18*  --  2.86* 3.41*  --  3.51*  CALCIUM 7.7*  --  7.4* 7.6*  --  6.5*  MG  --  1.0*  --  1.0* 2.3  --    Liver Function Tests: Recent Labs  Lab 05/17/18 0440  AST 9*  ALT 13*  ALKPHOS 56  BILITOT 0.4  PROT 5.0*  ALBUMIN 2.3*   No results for input(s): LIPASE, AMYLASE in the last 168 hours. No results for input(s): AMMONIA in the last 168 hours. CBC: Recent Labs  Lab 05/14/18 1610 05/15/18 0348  WBC 12.9* 11.5*  NEUTROABS 10.1*  --   HGB 10.4* 9.1*  HCT 36.6 31.6*  MCV 92.2 90.5  PLT 259 231   Cardiac Enzymes: Recent Labs  Lab 05/14/18 2123  TROPONINI <0.03   BNP: BNP (last 3 results) Recent Labs    01/27/18 1429 05/14/18 1610  BNP 739.5* 869.6*    ProBNP (last 3 results) No results for input(s): PROBNP in the last  8760 hours.  CBG: Recent Labs  Lab 05/16/18 1139 05/16/18 1621 05/16/18 2157 05/17/18 0749 05/17/18 1130  GLUCAP 144* 180* 173* 104* 138*       Signed:  Oswald Hillock MD.  [facility] 05/17/2018, 3:00 PM

## 2018-05-17 NOTE — Discharge Instructions (Signed)
Information on my medicine - XARELTO (Rivaroxaban)  You were taking this medication prior to this hospital admission.  Why was Xarelto prescribed for you? Xarelto was prescribed for you to reduce the risk of a blood clot forming that can cause a stroke if you have a medical condition called atrial fibrillation (a type of irregular heartbeat).  What do you need to know about xarelto ? Take your Xarelto ONCE DAILY at the same time every day with your evening meal. If you have difficulty swallowing the tablet whole, you may crush it and mix in applesauce just prior to taking your dose.  Take Xarelto exactly as prescribed by your doctor and DO NOT stop taking Xarelto without talking to the doctor who prescribed the medication.  Stopping without other stroke prevention medication to take the place of Xarelto may increase your risk of developing a clot that causes a stroke.  Refill your prescription before you run out.  After discharge, you should have regular check-up appointments with your healthcare provider that is prescribing your Xarelto.  In the future your dose may need to be changed if your kidney function or weight changes by a significant amount.  What do you do if you miss a dose? If you are taking Xarelto ONCE DAILY and you miss a dose, take it as soon as you remember on the same day then continue your regularly scheduled once daily regimen the next day. Do not take two doses of Xarelto at the same time or on the same day.   Important Safety Information A possible side effect of Xarelto is bleeding. You should call your healthcare provider right away if you experience any of the following: ? Bleeding from an injury or your nose that does not stop. ? Unusual colored urine (red or dark brown) or unusual colored stools (red or black). ? Unusual bruising for unknown reasons. ? A serious fall or if you hit your head (even if there is no bleeding).  Some medicines may interact  with Xarelto and might increase your risk of bleeding while on Xarelto. To help avoid this, consult your healthcare provider or pharmacist prior to using any new prescription or non-prescription medications, including herbals, vitamins, non-steroidal anti-inflammatory drugs (NSAIDs) and supplements.  This website has more information on Xarelto: [email or web address]

## 2018-05-17 NOTE — Consult Note (Signed)
   [facility] CM Inpatient Consult   05/17/2018  Melissa Bartlett [birth year] [id number]  Patient evaluated for community based chronic disease management services with [facility] as a benefit of patient's Medicare Insurance. Spoke with patient at bedside to explain [place] Management services.  Patient will receive post hospital discharge call and will be evaluated for monthly home visits for assessments and disease process education. Patient's primary care provider is  Dr. Georgina Snell [facility] at [facility].  Left contact information and [facility] literature at bedside. Consent form signed. A folder with [facility] information given.  Patient did not want EMMI calls at this time.  Will assign patient for [place] Management services.  Will make Inpatient Case Manager aware that [place] Management following. Of note, [facility] Care Management services does not replace or interfere with any services that are arranged by inpatient case management or social work.  For additional questions or referrals please contact:    Natividad Brood, RN BSN [facility] Liaison  [phone] business mobile phone Toll free office [phone]

## 2018-05-18 ENCOUNTER — Other Ambulatory Visit: Payer: Self-pay

## 2018-05-18 LAB — GLUCOSE, CAPILLARY: Glucose-Capillary: 175 mg/dL — ABNORMAL HIGH (ref 65–99)

## 2018-05-19 ENCOUNTER — Encounter: Payer: Self-pay | Admitting: Cardiology

## 2018-05-19 ENCOUNTER — Ambulatory Visit (INDEPENDENT_AMBULATORY_CARE_PROVIDER_SITE_OTHER): Payer: Medicare Other | Admitting: Cardiology

## 2018-05-19 ENCOUNTER — Ambulatory Visit (HOSPITAL_COMMUNITY)
Admission: RE | Admit: 2018-05-19 | Discharge: 2018-05-19 | Disposition: A | Discharge status: Home / Self Care | Payer: Medicare Other | Source: Ambulatory Visit | Attending: Nephrology | Admitting: Nephrology

## 2018-05-19 VITALS — BP 124/86 | HR 88 | Ht 62.5 in | Wt 242.8 lb

## 2018-05-19 VITALS — BP 163/91 | HR 87 | Temp 97.8°F | Resp 20

## 2018-05-19 DIAGNOSIS — I5032 Chronic diastolic (congestive) heart failure: Secondary | ICD-10-CM

## 2018-05-19 DIAGNOSIS — D631 Anemia in chronic kidney disease: Secondary | ICD-10-CM | POA: Diagnosis not present

## 2018-05-19 DIAGNOSIS — N184 Chronic kidney disease, stage 4 (severe): Secondary | ICD-10-CM | POA: Diagnosis not present

## 2018-05-19 DIAGNOSIS — I4821 Permanent atrial fibrillation: Secondary | ICD-10-CM

## 2018-05-19 DIAGNOSIS — I482 Chronic atrial fibrillation: Secondary | ICD-10-CM

## 2018-05-19 LAB — IRON AND TIBC
Iron: 48 ug/dL (ref 28–170)
Saturation Ratios: 17 % (ref 10.4–31.8)
TIBC: 290 ug/dL (ref 250–450)
UIBC: 242 ug/dL

## 2018-05-19 LAB — FERRITIN: FERRITIN: 65 ng/mL (ref 11–307)

## 2018-05-19 LAB — POCT HEMOGLOBIN-HEMACUE: Hemoglobin: 10.9 g/dL — ABNORMAL LOW (ref 12.0–15.0)

## 2018-05-19 MED ORDER — DARBEPOETIN ALFA 60 MCG/0.3ML IJ SOSY
60.0000 ug | PREFILLED_SYRINGE | INTRAMUSCULAR | Status: DC
  Administered 2018-05-19: 60 ug via SUBCUTANEOUS

## 2018-05-19 MED ORDER — DARBEPOETIN ALFA 60 MCG/0.3ML IJ SOSY
PREFILLED_SYRINGE | INTRAMUSCULAR | Status: AC
  Administered 2018-05-19: 60 ug via SUBCUTANEOUS
  Filled 2018-05-19: qty 0.3

## 2018-05-19 NOTE — Progress Notes (Signed)
[place]. [address] [place], [place]  [address] Phone: [phone] Fax:  [phone]  Date:  05/19/2018   ID:  Melissa Bartlett, DOB May 15, 1946, MRN [id number]  PCP:  Donald Prose, MD   History of Present Illness: Melissa Bartlett is a 72 y.o. female with permanent atrial fibrillation, rate control, obstructive sleep apnea, morbid obesity here for follow-up.  05/12/17 - She was in the hospital in March 2018 with atypical chest pain as well as October 28, 2017. Troponins were normal. Reassuring. Ejection fraction was also normal.  Pain seem to be reproducible with palpation over left chest.  Outpatient nuclear stress test ordered. The stress test is not completely normal. There could be what we call transient ischemic dilatation which sometimes is seen with coronary artery disease. There may be a mild anterior defect as well although attenuation artifact could be present. I think given the recent hospitalization for chest pain, it would make sense to pursue cardiac catheterization. She would need to hold her anticoagulation for 2 days prior to procedure.  She has had in the past difficult to control hypertension on multidrug regimen. She is also seeing nephrology because of creatinine in the 2 range.  Her daughter unfortunately has pseudotumor cerebri and underwent shunt procedure and had difficulty with motor weakness, difficulty speaking.   Cardioversion --felt no change. No bleeding, no chest pain, no syncope.  In the past has been on prednisone taper from Dr. Charlestine Night for rheumatoid arthritis.  She denies any chest pain, syncope, orthopnea, PND. Still struggling with weight gain.  05/19/2018- she is back today for follow-up, was in the hospital recently with weakness, magnesium was repleted, diuresis took place.  Lasix was increased to 60 mg p.o. twice daily.  Nephrology discussed.  On 10/2017 she underwent cardiac catheterization which showed normal coronary arteries,  elevated LVEDP.  Normal EF.  Currently she is feeling well.  Weighing herself daily.  No chest pain fevers chills nausea vomiting syncope   Wt Readings from Last 3 Encounters:  05/19/18 242 lb 12.8 oz (110.1 kg)  05/17/18 241 lb (109.3 kg)  04/05/18 255 lb 12.8 oz (116 kg)     Past Medical History:  Diagnosis Date  . CHF (congestive heart failure) ([place])   . CKD (chronic kidney disease), stage IV ([place])   . Complication of anesthesia   . Diabetes ([place])   . HTN (hypertension)   . OSA (obstructive sleep apnea)    with AHI 11/hr with nocturnal hypoxemia   . PAF (paroxysmal atrial fibrillation) ([place])    s/p DCCV in 8/17  . RA (rheumatoid arthritis) ([place])     Past Surgical History:  Procedure Laterality Date  . ANKLE FUSION Right   . BREAST BIOPSY    . CARDIOVERSION N/A 08/15/2016   Procedure: CARDIOVERSION;  Surgeon: Jerline Pain, MD;  [facility] ENDOSCOPY;  Service: Cardiovascular;  Laterality: N/A;  . CHOLECYSTECTOMY    . GALLBLADDER SURGERY    . HYSTEROTOMY     Patrtial  . HYSTEROTOMY     Complete  . KNEE ARTHROSCOPY    . LEFT HEART CATH AND CORONARY ANGIOGRAPHY N/A 11/16/2017   Procedure: LEFT HEART CATH AND CORONARY ANGIOGRAPHY;  Surgeon: Martinique, Peter M, MD;  Location: [facility] CV LAB;  Service: Cardiovascular;  Laterality: N/A;    Current Outpatient Medications  Medication Sig Dispense Refill  . amLODipine (NORVASC) 5 MG tablet Take 1 tablet (5 mg total) by mouth daily. [place]  tablet 3  . brimonidine-timolol (COMBIGAN) 0.2-0.5 % ophthalmic solution Place 1 drop into both eyes 2 (two) times daily.    . Calcium Carbonate-Vitamin D3 (CALCIUM 600/VITAMIN D) 600-400 MG-UNIT TABS Take 1 tablet by mouth 2 (two) times daily.    . carvedilol (COREG) 25 MG tablet TAKE 1 TABLET BY MOUTH TWICE A DAY WITH A MEAL 180 tablet 2  . Darbepoetin Alfa (ARANESP) 60 MCG/0.3ML SOSY injection Inject 60 mcg every 30 (thirty) days into the skin.    Marland Kitchen diphenhydramine-acetaminophen (TYLENOL PM)  25-500 MG TABS tablet Take 1 tablet by mouth at bedtime.     . furosemide (LASIX) 20 MG tablet Take 3 tablets (60 mg total) by mouth 2 (two) times daily. 90 tablet 11  . hydrALAZINE (APRESOLINE) 50 MG tablet Take 1 tablet (50 mg total) by mouth 3 (three) times daily. 270 tablet 3  . leflunomide (ARAVA) 10 MG tablet Take 10 mg by mouth daily.     . pantoprazole (PROTONIX) 40 MG tablet Take 1 tablet (40 mg total) by mouth daily. 30 tablet 0  . potassium chloride SA (KLOR-CON M20) 20 MEQ tablet Take 1 tablet (20 mEq total) by mouth daily. 90 tablet 3  . predniSONE (DELTASONE) 5 MG tablet Take 5 mg by mouth daily.     Marland Kitchen PRESCRIPTION MEDICATION Inhale into the lungs at bedtime. CPAP    . Probiotic Product (ALIGN PO) Take 1 tablet by mouth at bedtime.     . promethazine (PHENERGAN) 25 MG tablet Take 25 mg by mouth every 6 (six) hours as needed for nausea or vomiting.    . Rivaroxaban (XARELTO) 15 MG TABS tablet Take 1 tablet (15 mg total) daily with supper by mouth. (Patient taking differently: Take 15 mg by mouth at bedtime. ) 30 tablet 8  . simvastatin (ZOCOR) 20 MG tablet Take 20 mg by mouth at bedtime.   2  . vitamin B-12 (CYANOCOBALAMIN) 1000 MCG tablet Take 1,000 mcg by mouth daily.     No current facility-administered medications for this visit.     Allergies:    Allergies  Allergen Reactions  . Enalapril Other (See Comments)    Systemic reaction - jerking  . Codeine Itching  . Oxycodone Itching  . Vicodin [Hydrocodone-Acetaminophen] Itching    Social History:  The patient  reports that she quit smoking about 2 years ago. She has never used smokeless tobacco. She reports that she does not drink alcohol or use drugs.   ROS:  Please see the history of present illness.   Almost above all other review of systems negative. PHYSICAL EXAM: VS:  BP 124/86   Pulse 88   Ht 5' 2.5" (1.588 m)   Wt 242 lb 12.8 oz (110.1 kg)   LMP  (LMP Unknown)   BMI 43.70 kg/m  GEN: Well nourished, well  developed, in no acute distress obese HEENT: normal  Neck: no JVD, carotid bruits, or masses Cardiac: irreg; no murmurs, rubs, or gallops, trace lower extremity edema  Respiratory:  clear to auscultation bilaterally, normal work of breathing GI: soft, nontender, nondistended, + BS MS: no deformity or atrophy  Skin: warm and dry, no rash Neuro:  Alert and Oriented x 3, Strength and sensation are intact Psych: euthymic mood, full affect    EKG: 11/10/17-atrial fibrillation heart rate 95 right axis deviation.  07/08/16 - AFIB 75, NSSTW changes. 07/09/15 - SB with PRWP no changes. 03/07/14 - NSR no other changes.   Cath 11/16/17: 1. Normal coronary  angiography 2. Elevated LVEDP  ASSESSMENT AND PLAN:  1. Chest pain/she has had multiple admissions with chest pain- very reassuring cardiac catheterization in November 2018.  Continue with medical therapy. 2. AFIB - now permanent, failed prior DC cardioversion.  Rate control.  Really no change in symptoms. Discovered on 07/08/16 CHADS-VASc -3 female and HTN, Age (A1c 5.6 on no meds). cardioversion at that time. She is completely asymptomatic at this time. She did not realize that she was in atrial fibrillation. Risk factor includes obesity. She is well rate controlled currently.  No changes made. 3. Chronic diastolic heart failure-this is secondary to morbid obesity.  Dose of Lasix now 60 mg twice a day.  If weight goes up 3 pounds, take additional 60 mg of Lasix until weight is reduced. 4. Chronic anticoagulation-continue Xarelto 15 mg, dose reduced for decreased creatinine clearance in the evening. We discussed bleeding risks. 5. Stage IV chronic kidney disease-as above. Avoid NSAIDs. Reviewed nephrology notes.  Close follow-up with nephrology. 6. Hypertension- overall doing very well and is quite pleased. Medications reviewed.  Doing very well from hypertension standpoint. 7. Morbid Obesity- no changes, continue to encourage weight loss. This is  contributing to her lower external edema.  8. Tobacco use-excellent job with tobacco cessation 9. A1c 5.6. Excellent. Doing very well. 10. Obstructive sleep apnea-sleep study November 2017-Dr. Turner, CPAP, previously working with advanced home care to improve mask leak stable 11. 66-month follow-up with Cecilie Kicks, 6 months with me.  Trying to avoid repeat ER visit/hospital  Signed, Candee Furbish, MD [facility]  05/19/2018 9:31 AM

## 2018-05-19 NOTE — Patient Instructions (Signed)
Medication Instructions:  The current medical regimen is effective;  continue present plan and medications.  Follow-Up: Follow up in 3 months with Cecilie Kicks, NP.  Follow up in 6 months with Dr. Marlou Porch.  You will receive a letter in the mail 2 months before you are due.  Please call us when you receive this letter to schedule your follow up appointment.  Any Other Special Instructions Will Be Listed Below (If Applicable). Please weigh daily.  If weight increases over night by 3 lbs take extra 60 mg of Furosemide (Lasix) until return to normal weight.  Please limit sodium intake to less than 2,000 mg a day.  If you need a refill on your cardiac medications before your next appointment, please call your pharmacy.  Thank you for choosing [facility]!!

## 2018-05-21 ENCOUNTER — Encounter: Payer: Self-pay | Admitting: *Deleted

## 2018-05-21 ENCOUNTER — Other Ambulatory Visit: Payer: Self-pay | Admitting: *Deleted

## 2018-05-21 NOTE — Patient Outreach (Signed)
[facility]) Care Management [place] Telephone Outreach PCP completes transition of care outreach post-hospital discharge Post-hospital discharge day 4  05/21/2018  Melissa Bartlett 08/14/1946 [id number]  Successful telephone outreach to Melissa Bartlett, 72 y/o female referred to [place] after recent hospitalization May 17-20, 2019 for Acute respiratory Failure, weakness, fatigue, shortness of breath, secondary to CHF exacerbation with pulmonary edema; patient noted to have episode of V-tach during hospitalization.  Patient has history including, but not limited to, HTN; morbid obesity; CKD-stage IV; OSA with CPAP; Atrial Fibrillation; CHF; Rheumatoid arthritis; and DM.  HIPAA/ identity verified with patient during phone call today, and [facility] community CM services were discussed with patient; patient provided verbal consent for [place] involvement in her care; written consent obtained during hospitalization by [facility] Liaison on 05/17/18.  Referral was sen to this [place] on 05/21/18 by previously assigned [facility] RN CCM.  Today, patient reports that she "is feeling better ad doing real good;" patient denies pain today and sounds to be in no obvious distress throughout 30 minute phone call.  Patient reports that home health services were not ordered at time of hospital discharge; patient stated that she does not believe she needs home health services post-most recent hospital discharge.  Patient further reports:  Medications: -- Has all medicationsand takes as prescribed;denies questions/ concerns about current medications.  -- self-manage medications using weekly pill planner box. -- denies issues with swallowing medications -- patient declines medication review today, stating time constraints-- discussed with patient need for medication review, and she agrees to do at time of [place] initial home visit, scheduled today for next  week.  Provider appointments: -- All recent and upcoming provider appointments were reviewed with patient today; patient report attended cardiology office visit on Wednesday 05/19/18, and states was "given a good report;" denies changes in medications as a result of cardiology office visit -- has scheduled post-hospital discharge PCP and renal provider office appointment both on Tuesday 05/25/18; verbalizes plans to attend both  Safety/ Mobility/ Falls: -- denies new/ recent falls-- states she has not "ever" experienced any falls and ambulates without "any problems" without use of assistive devices -- general fall risks/ prevention education discussed with patient today  Holiday representative needs: -- currently denies community resource needs, stating supportive family members that assist with care needs as indicated -- family, primarily husband, provides transportation for patient to all provider appointments, errands, etc; other family members assist as indicated  Scientist, physiological (AD) Planning:   --reports currently has exisisting AD in place for living will; unsure if she has specified HCPOA, stated that her husband would make any decisions for her if necessary; denies desire to make changes as result of recent hospitalization.  Self-health management of chronic disease state of CHF: -- currently monitors and records daily weights, "has done this for a long time;" verbalizes accurate understanding of purpose/ rationale for weight monitoring and for weight gain guidelines -- reports weight at cardiology office "242 lbs," with weight today at home of "238 lbs" -- patient verbalizes action plan provided by cardiology provider for use of "extra fluid pill" during times of increased peripheral swelling/ weight gain > 3 lbs overnight: states plan is for her take "one extra 60 mg of lasix" on these days with no more than 2 "extra" doses in one week-- if indicated, states she would then contact  her cardiology provider for further instructions; today, patient verbalizes  accurate report of current diuretic dosing, scheduling, and purpose  -- denies episodes shortness of breath post-hospital discharge; using CPAP q HS as ordered; denies use of home O2 -- endorses follows heart healthy, low sodium diet with "low fluid" intake  Patient denies further issues, concerns, or problems today.  I provided and confirmed that patient has my direct phone number, the main [facility] CM office phone number, and the [facility] CM 24-hour nurse advice phone number should issues arise prior to next scheduled [place] outreach, with initial home visit scheduled today for next week.  Encouraged patient to contact me directly if needs, questions, issues, or concerns arise prior to next scheduled outreach; patient agreed to do so.  Plan:  Patient will take medications as prescribed and will attend all schedule provider appointments post-hospital discharge  Patient will promptly notify care providers for any new concerns/ issues/ problems that arise  Patient will continue to monitor and record daily weights  I will notify patient's PCP of [place] CM involvement in patient's care post-hospital discharge-- will send barriers letter  [place] outreach to continue with scheduled initial home visit next week   [facility] CM Care Plan Problem One     Most Recent Value  Care Plan Problem One  Risk for hospital readmission related to/ as evidenced by recent hospitalization May 17-20, 2019 for ARF due to CHF exacerbation  Role Documenting the Problem One  Care Management [place] for Problem One  Active  THN Long Term Goal   Over the next 31 days, patient will not experience hospital re-admission, as evidenced by patient reporting and review of EMR during [facility] RN CCM outreach  [place] Term Goal Start Date  05/21/18  Interventions for Problem One Long Term Goal  Reviewed with patient hospital  discharge instructions,  recent cardiology office visit,  confirmed that patient has scheduled PCP post-follow up appointment scheduled for next week.  Discussed with patient [place] program and scheduled initial [facility] CCM home visit for next week  THN CM Short Term Goal #1   Over the next 30 days, patient will attend all scheduled provider appointments, as evidenced by patient reporting and collaboration with care providers, as indicated, during [place] outreach  [facility] CM Short Term Goal #1 Start Date  05/21/18  Interventions for Short Term Goal #1  provided positive reinforcement for patient attending recent cardiology office visit and reviewed post-visit notes with patient,  confirmed with patient that she has scheduled PCP appointment next week and confirmed that her husband will provide transportation     I appreciate the opportunity to participate in [place]' care,  Oneta Rack, RN, BSN, [employer] [facility] Care Management  [phone]

## 2018-05-25 DIAGNOSIS — I480 Paroxysmal atrial fibrillation: Secondary | ICD-10-CM | POA: Diagnosis not present

## 2018-05-25 DIAGNOSIS — D631 Anemia in chronic kidney disease: Secondary | ICD-10-CM | POA: Diagnosis not present

## 2018-05-25 DIAGNOSIS — I129 Hypertensive chronic kidney disease with stage 1 through stage 4 chronic kidney disease, or unspecified chronic kidney disease: Secondary | ICD-10-CM | POA: Diagnosis not present

## 2018-05-25 DIAGNOSIS — M069 Rheumatoid arthritis, unspecified: Secondary | ICD-10-CM | POA: Diagnosis not present

## 2018-05-25 DIAGNOSIS — N184 Chronic kidney disease, stage 4 (severe): Secondary | ICD-10-CM | POA: Diagnosis not present

## 2018-05-25 DIAGNOSIS — N179 Acute kidney failure, unspecified: Secondary | ICD-10-CM | POA: Diagnosis not present

## 2018-05-25 DIAGNOSIS — R801 Persistent proteinuria, unspecified: Secondary | ICD-10-CM | POA: Diagnosis not present

## 2018-05-25 DIAGNOSIS — E1122 Type 2 diabetes mellitus with diabetic chronic kidney disease: Secondary | ICD-10-CM | POA: Diagnosis not present

## 2018-05-25 DIAGNOSIS — I509 Heart failure, unspecified: Secondary | ICD-10-CM | POA: Diagnosis not present

## 2018-05-25 DIAGNOSIS — I5033 Acute on chronic diastolic (congestive) heart failure: Secondary | ICD-10-CM | POA: Diagnosis not present

## 2018-05-25 DIAGNOSIS — I4891 Unspecified atrial fibrillation: Secondary | ICD-10-CM | POA: Diagnosis not present

## 2018-05-25 DIAGNOSIS — E669 Obesity, unspecified: Secondary | ICD-10-CM | POA: Diagnosis not present

## 2018-05-25 DIAGNOSIS — N189 Chronic kidney disease, unspecified: Secondary | ICD-10-CM | POA: Diagnosis not present

## 2018-05-25 DIAGNOSIS — I110 Hypertensive heart disease with heart failure: Secondary | ICD-10-CM | POA: Diagnosis not present

## 2018-05-25 DIAGNOSIS — I5030 Unspecified diastolic (congestive) heart failure: Secondary | ICD-10-CM | POA: Diagnosis not present

## 2018-05-25 DIAGNOSIS — R0902 Hypoxemia: Secondary | ICD-10-CM | POA: Diagnosis not present

## 2018-05-27 ENCOUNTER — Other Ambulatory Visit: Payer: Self-pay | Admitting: *Deleted

## 2018-05-27 ENCOUNTER — Encounter: Payer: Self-pay | Admitting: *Deleted

## 2018-05-27 NOTE — Patient Outreach (Signed)
[facility]) Care Management  [place] Initial Home Visit Post-hospital discharge day 10 05/27/2018  Melissa Bartlett 01-May-1946 [id number]  Melissa Bartlett is an 72 y.o. female referred to [place] after recent hospitalization May 17-20, 2019 for Acute respiratory Failure, weakness, fatigue, shortness of breath, secondary to CHF exacerbation with pulmonary edema; patient noted to have episode of V-tach during hospitalization.  Patient has history including, but not limited to, HTN; morbid obesity; CKD-stage IV; OSA with CPAP; Atrial Fibrillation; CHF; Rheumatoid arthritis; and DM.  HIPAA/ identity verified with patient in person at her home today, and she is alone for home visit; pleasant 105 minute home visit.  Today, patient reports that she "is doing really good" and she denies pain is in no obvious distress throughout entirety of visit.  Subjective: "I just want to do everything I can to make sure I stay as healthy as possible."  Assessment:  Melissa Bartlett appears to be recuperating excellently after her recent hospitalization, and she denies community resource needs.  Melissa Bartlett is adherent to taking her medications as prescribed, but would like to have a better system of managing her medications from her current system, and a new system was developed today during [name] initial home visit.  Melissa Bartlett has a good understanding of her current plan of care for self-health management of mulitiple chronic disease states, but could benefit from ongoing reinforcement of self-health strategies for CHF and A-fib.  Melissa Bartlett is adherent to attending all scheduled provider appointments.  Patient further reports:  Medications: -- Has all medicationsand takes as prescribed;denies questions around current medications, but states that she is having a difficult time knowing how to manage her pills that are prescribed more than once daily-- patient has been using a one  slot weekly pill planner box, and has been taking her medications that are scheduled more than once daily straight from the prescription bottles.  Patient verbalizes that she is afraid she might miss her scheduled doses due to her current system for managing medications.  I provided a 4-slot weekly pill planner box to patient and instructed/ assisted her in filling this pill box accurately -- self-manage medications  -- denies issues with swallowing medications -- Patient was recently discharged from hospital and all medications were thoroughly reviewed with patient in person today; patient verbalizes an excellent understanding of the general purpose, dosing, and scheduling of her medications; patient is very organized with her medications, although the newly provided pill box should make medication management less challenging for her. -- No medication discrepancies discovered today during medication review -- reports that she is to start taking magnesium, as a result of recent renal provider office visit-- plans to pick this up from pharmacy today  Provider appointments: -- All recent and upcoming provider appointments were reviewed with patient today; patient report attended cardiology office visit on Wednesday 05/19/18, and states was "given a good report;" denies changes in medications as a result of cardiology office visit; post-hospital discharge PCP and renal provider office appointment both on Tuesday 05/25/18 -- reports that she was told by her kidney/ renal provider that she would need to be scheduled soon for placement of AV fistula for possibility of future need for hemodialysis (HD); stated that renal provider assured her this was for precautionary measures only, and that she would not have to start HD 'anytime soon."  Safety/ Mobility/ Falls: -- denies new/ recent falls-- fall evaluation was completed, and patient demonstrates purposeful steady gain  with ambulation around her home without  use of assistive devices -- no obvious fall risks/ hazards noted in patient's home environment today:  Home is free of clutter on floor, rugs are in good condition -- general fall risks/ prevention education discussed with patient today  Social/ Community Resource needs: -- continues to deny community resource needs, stating supportive family members that assist with care needs as indicated -- family, primarily husband, provides transportation for patient to all provider appointments, errands, etc; other family members assist as indicated  Self-health management of chronic disease state of CHF: education provided and reviewed with patient today using teach- back method: see care plan below for specifics -- currently monitors and records daily weights, verbalizes accurate understanding of purpose/ rationale for weight monitoring and for weight gain guidelines -- reports weight at cardiology office "242 lbs," with weight today at home of "238 lbs."  We reviewed all recent daily weights recorded at home, and patient's general weight ranges have been 238-240 lbs post- recent hospital discharge -- patient again verbalizes action plan provided by cardiology provider for use of "extra fluid pill" during times of increased peripheral swelling/ weight gain > 3 lbs overnight: states plan is for her take "one extra 60 mg of lasix" on these days with no more than 2 "extra" doses in one week-- if indicated, states she would then contact her cardiology provider for further instructions; today, patient verbalizes accurate report of current diuretic dosing, scheduling, and purpose  -- denies episodes shortness of breath post-hospital discharge; using CPAP q HS as ordered; does not use home O2 -- endorses follows heart healthy, low sodium diet with "low fluid" intake-- we thoroughly discussed strategies around simple ways to gradually change cooking/ dietary habits to further decrease sodium intake -- extensively  discussed CHF and A-F pathophysiology in combined clinical setting  -- patient routinely checks pulse oximetry and blood pressure readings at home, and demonstrates good technique in self-monitoring of both  Patient denies further issues, concerns, or problems today. I provided and confirmed that patient hasmy direct phone number, the main [facility] CM office phone number, and the [facility] CM 24-hour nurse advice phone number should issues arise prior to next scheduled [place] outreach by phone next week.  Patient stated that she contacted the 24-hour nurse advice line "one day last week" around concerns that she may be having an allergic skin reaction; reports she spoke with nurse, took benadryl, and this issue resolved without further intervention.  Encouraged patient to contact me directly if needs, questions, issues, or concerns arise prior to next scheduled outreach; patient agreed to do so.  Objective:    BP (!) 142/72   Pulse 85   Resp 16   Ht 1.6 m (5\' 3" )   Wt 238 lb 9.6 oz (108.2 kg)   LMP  (LMP Unknown)   SpO2 97%   BMI 42.27 kg/m   Review of Systems  Constitutional: Negative.   Respiratory: Negative.  Negative for cough, shortness of breath and wheezing.   Cardiovascular: Positive for leg swelling. Negative for chest pain and palpitations.       Patient has minimal (+1) (R) ankle swelling that she attributes to ankle surgery 3-4 yrs ago; no (L) LE swelling noted  Gastrointestinal: Negative.  Negative for abdominal pain and nausea.  Musculoskeletal: Negative.  Negative for falls and myalgias.       Reports occasional myalgia related to history of rheumatoid arthritis; none reported today  Neurological: Negative.  Negative  for dizziness.  Psychiatric/Behavioral: Negative.  Negative for depression. The patient is not nervous/anxious.    Physical Exam  Constitutional: She is oriented to person, place, and time. She appears well-developed and well-nourished. No distress.  obese   Cardiovascular: Normal rate, regular rhythm, normal heart sounds and intact distal pulses.  Pulses:      Radial pulses are 2+ on the right side, and 2+ on the left side.  Respiratory: Effort normal and breath sounds normal. No respiratory distress. She has no wheezes. She has no rales.  GI: Soft. Bowel sounds are normal.  Musculoskeletal: She exhibits edema.  See ROS  Neurological: She is alert and oriented to person, place, and time.  Skin: Skin is warm and dry. No erythema.  Psychiatric: She has a normal mood and affect. Her behavior is normal. Judgment and thought content normal.   Encounter Medications:   Outpatient Encounter Medications as of 05/27/2018  Medication Sig Note  . amLODipine (NORVASC) 5 MG tablet Take 1 tablet (5 mg total) by mouth daily.   . brimonidine-timolol (COMBIGAN) 0.2-0.5 % ophthalmic solution Place 1 drop into both eyes 2 (two) times daily.   . Calcium Carbonate-Vitamin D3 (CALCIUM 600/VITAMIN D) 600-400 MG-UNIT TABS Take 1 tablet by mouth 2 (two) times daily.   . carvedilol (COREG) 25 MG tablet TAKE 1 TABLET BY MOUTH TWICE A DAY WITH A MEAL   . Darbepoetin Alfa (ARANESP) 60 MCG/0.3ML SOSY injection Inject 60 mcg every 30 (thirty) days into the skin. 05/27/2018: Took last on May 19, 2018  . diphenhydramine-acetaminophen (TYLENOL PM) 25-500 MG TABS tablet Take 1 tablet by mouth at bedtime.    . furosemide (LASIX) 20 MG tablet Take 3 tablets (60 mg total) by mouth 2 (two) times daily.   . hydrALAZINE (APRESOLINE) 50 MG tablet Take 1 tablet (50 mg total) by mouth 3 (three) times daily.   Marland Kitchen leflunomide (ARAVA) 10 MG tablet Take 10 mg by mouth daily.    . pantoprazole (PROTONIX) 40 MG tablet Take 1 tablet (40 mg total) by mouth daily.   . potassium chloride SA (KLOR-CON M20) 20 MEQ tablet Take 1 tablet (20 mEq total) by mouth daily.   . predniSONE (DELTASONE) 5 MG tablet Take 5 mg by mouth daily.    Marland Kitchen PRESCRIPTION MEDICATION Inhale into the lungs at bedtime. CPAP   .  Probiotic Product (ALIGN PO) Take 1 tablet by mouth at bedtime.    . Rivaroxaban (XARELTO) 15 MG TABS tablet Take 1 tablet (15 mg total) daily with supper by mouth. (Patient taking differently: Take 15 mg by mouth at bedtime. )   . simvastatin (ZOCOR) 20 MG tablet Take 20 mg by mouth at bedtime.    . vitamin B-12 (CYANOCOBALAMIN) 1000 MCG tablet Take 1,000 mcg by mouth daily.   . promethazine (PHENERGAN) 25 MG tablet Take 25 mg by mouth every 6 (six) hours as needed for nausea or vomiting.    No facility-administered encounter medications on file as of 05/27/2018.    Functional Status:   In your present state of health, do you have any difficulty performing the following activities: 05/21/2018 05/14/2018  Hearing? N N  Vision? N N  Difficulty concentrating or making decisions? N N  Walking or climbing stairs? N N  Dressing or bathing? N N  Doing errands, shopping? Y N  Comment [employer] provides transportation Facilities manager and eating ? N -  Using the Toilet? N -  In the past six months, have you  accidently leaked urine? N -  Do you have problems with loss of bowel control? N -  Managing your Medications? N -  Managing your Finances? N -  Housekeeping or managing your Housekeeping? N -  Some recent data might be hidden   Fall/Depression Screening:    Fall Risk  05/21/2018  Falls in the past year? No  Risk for fall due to : Other (Comment)  Risk for fall due to: Comment None per patient report today   PHQ 2/9 Scores 05/27/2018  PHQ - 2 Score 0   Plan:  Patient will take medications as prescribed and will attend all schedule provider appointments post-hospital discharge  Patient will promptly notify care providers for any new concerns/ issues/ problems that arise  Patient will continue to monitor and record daily weights   Patient will review printed educational material provided to her today with focus on yellow CHF/ AF zones with corresponding action plans  I will share  today's notes and care plan with patient's PCP   [facility] CM outreach to continue with scheduled phone call next week   [facility] CM Care Plan Problem One     Most Recent Value  Care Plan Problem One  Risk for hospital readmission related to/ as evidenced by recent hospitalization May 17-20, 2019 for ARF due to CHF exacerbation  Role Documenting the Problem One  Care Management Coordinator  Care Plan for Problem One  Active  THN Long Term Goal   Over the next 31 days, patient will not experience hospital re-admission, as evidenced by patient reporting and review of EMR during [facility] RN CCM outreach  [facility] Long Term Goal Start Date  05/21/18  Interventions for Problem One Long Term Goal  [facility] RN CM initial home visit completd with physical assessment, medication review, fall assessment, review of recent and upcoming provider appointments  [facility] CM Short Term Goal #1   Over the next 30 days, patient will attend all scheduled provider appointments, as evidenced by patient reporting and collaboration with care providers, as indicated, during [facility] RN CCM outreach  [facility] CM Short Term Goal #1 Start Date  05/21/18  Interventions for Short Term Goal #1  Discussed with patient recent provider office visits post-hospital discharge to PCP and renal provider    [facility] CM Care Plan Problem Two     Most Recent Value  Care Plan Problem Two  Knowledge deficit regarding self-health management of chronic disease states of CHF and A-Fib, as evidenced by patient reporting of same   Role Documenting the Problem Two  Care Management Coordinator  Care Plan for Problem Two  Active  Interventions for Problem Two Long Term Goal   Using teach back method, provided and thoroughly reviewed with patient printed EMMI education around self-health management of CHF and AF,  provided [facility] CM recording tool to patient and instructed in use of same.  Discussed with patient value of daily weight monitoring and recording and reviewed all  recently recorded patient weights at home and reviewed weight gain guidelines in setting of CHF.  provided positive reinforcement to patient for having established personalized action plan with cardiology provider for days of weight gain.  Discussed low sodium diet with patient.  Using teach back  method, introduced CHF and AF zones along with corresponding action plans, and encouraged patient to review printed material frequently  THN Long Term Goal  Over the next 82 days, patient will be able to verbalize signs/ symptoms of yellow zones for  CHF and AF, along with corresponding action plans, as evidenced by patient reporting of same during [place] outreach  [place] Term Goal Start Date  05/27/18  [facility] CM Short Term Goal #1   Over the next 30 days, patient will take all medications as prescribed, as evidenced by patient reporting during [place] outreach  [facility] CM Short Term Goal #1 Start Date  05/27/18  Interventions for Short Term Goal #2   Discussed patient's challenges around managing medications at home,  provided weekly (4)-slot pill box to patient and assisted her in filling pill box,  all medication reviewed thoroughly with patient today     Oneta Rack, RN, BSN, [place] Coordinator [facility] Care Management  [phone]

## 2018-05-31 ENCOUNTER — Other Ambulatory Visit: Payer: Self-pay

## 2018-05-31 DIAGNOSIS — Z01812 Encounter for preprocedural laboratory examination: Secondary | ICD-10-CM

## 2018-05-31 DIAGNOSIS — N185 Chronic kidney disease, stage 5: Secondary | ICD-10-CM

## 2018-06-03 ENCOUNTER — Other Ambulatory Visit: Payer: Self-pay | Admitting: *Deleted

## 2018-06-03 ENCOUNTER — Encounter: Payer: Self-pay | Admitting: *Deleted

## 2018-06-03 NOTE — Patient Outreach (Signed)
[facility]) Care Management [place] Telephone Outreach Post-hospital discharge day 17  06/03/2018  Melissa Bartlett 1946-01-28 [id number]  Successful telephone outreach to Melissa Bartlett, 72 y.o. female referred to [place] after recent hospitalization May 17-20, 2019 for Acute respiratory Failure, weakness, fatigue, shortness of breath, secondary to CHF exacerbation with pulmonary edema; patient noted to have episode of V-tach during hospitalization.Patient has history including, but not limited to, HTN; morbid obesity; CKD-stage IV; OSA with CPAP; Atrial Fibrillation; CHF; Rheumatoid arthritis; and DM. HIPAA/ identity verified with patient during phone call today.  Today, patient reports that she "is doing great" and she denies pain, new/ recent falls, and is in no obvious distress throughout entirety of phone call.  Patient further reports:  -- Has all medicationsand takes as prescribed;denies questions around current medications, using pill planner box provided to her at time of [place] initial home visit; states new pill box "helping a lot."  Reports has now started newly prescribed magnesium.  -- provider appointments:  previously reported that she was told by kidney/ renal provider that she would need to be scheduled soon for placement of AV fistula for possibility of future need for hemodialysis (HD); stated that this has now been scheduled for July 08, 2018; all other upcoming provider appointments reviewed with patient today   Self-health management ofchronic disease state of CHF: education provided and reviewed with patient today using teach- back method: see care plan below for specifics --continues monitoring/ recording daily weights; reports weight ranges since [facility] RN CCM initial home visit 238- 242 lbs; reports weight fluctuations "all less than 2 lbs overnight;" reports weight this morning of 241.8 lbs -- denies issues/ concerns  around breathing status/ LE edema; states she "is in green zone" today; has continued reviewing previously provided education around self-health management of chronic disease state of CHF -- continues using CPAP as ordered q HS -- continues monitoring/ recording blood pressures, endorses "all looking good" and denies concerns around same  Patient denies further issues, concerns, or problems today. Iconfirmed that patient hasmy direct phone number, the main [facility] CM office phone number, and the [facility] CM 24-hour nurse advice phone number should issues arise prior to next scheduled [place] outreach by phone in 2 weeks. Encouraged patient to contact me directly or to contact [facility] CM main office, if needs, questions, issues, or concerns arise prior to next scheduled outreach; patient agreed to do so. Plan:  Patient will take medications as prescribed and will attend all schedule provider appointments post-hospital discharge  Patient will promptly notify care providers for any new concerns/ issues/ problems that arise  Patient will continue to monitor and record daily weights   Patient will review printed educational material previously provided to her with focus on yellow CHF/ AF zones with corresponding action plans, as indicated  [place] CM outreach to continue with scheduled phone call in 2 weeks  [facility] CM Care Plan Problem One     Most Recent Value  Care Plan Problem One  Risk for hospital readmission related to/ as evidenced by recent hospitalization May 17-20, 2019 for ARF due to CHF exacerbation  Role Documenting the Problem One  Care Management [place] for Problem One  Active  [facility] Long Term Goal   Over the next 31 days, patient will not experience hospital re-admission, as evidenced by patient reporting and review of EMR during [place] outreach  [facility] Long Term Goal  Start Date  05/21/18  Interventions for Problem One Long Term Goal  Discussed current  clinical status with patient and reviewed upcoming provider appointments and confirmed that patient has transportation to scheduled provider appointments  [facility] CM Short Term Goal #1   Over the next 30 days, patient will attend all scheduled provider appointments, as evidenced by patient reporting and collaboration with care providers, as indicated, during [place] outreach  [facility] CM Short Term Goal #1 Start Date  05/21/18  Interventions for Short Term Goal #1  Reviewed all upcoming provider appointments with patient and confirmed that she is planning to attend all as scheduled    [facility] CM Care Plan Problem Two     Most Recent Value  Care Plan Problem Two  Knowledge deficit regarding self-health management of chronic disease states of CHF and A-Fib, as evidenced by patient reporting of same   Role Documenting the Problem Two  Care Management [place] for Problem Two  Active  Interventions for Problem Two Long Term Goal   Confirmed that patient has continued reviewing proeviously provided education around CHF/ AF and that she is able to verbalize signs/ symptoms/ corressponding action plan for yellow zone,  confirmed that patient has been in green zone since last [facility] CCM RN home visit on 05/27/18,  confirmed that patient has continued monitoring and recording daily weights and that she is able to verbalize weight gain guidelines in setting of CHF  [facility] Long Term Goal  Over the next 82 days, patient will be able to verbalize signs/ symptoms of yellow zones for CHF and AF, along with corresponding action plans, as evidenced by patient reporting of same during [place] CM outreach  [facility] Long Term Goal Start Date  05/27/18  [facility] CM Short Term Goal #1   Over the next 30 days, patient will take all medications as prescribed, as evidenced by patient reporting during [facility] RN CCM outreach  [facility] CM Short Term Goal #1 Start Date  05/27/18  Interventions for Short Term Goal #2   Confirmed that patient  is taking all medicatins as prescribed and is successfully using new pill box, provided at time of [facility] RN CCM initial home visit 05/27/18     Oneta Rack, RN, BSN, [place] Coordinator [facility] Care Management  [phone]

## 2018-06-07 DIAGNOSIS — J209 Acute bronchitis, unspecified: Secondary | ICD-10-CM | POA: Diagnosis not present

## 2018-06-07 DIAGNOSIS — I4891 Unspecified atrial fibrillation: Secondary | ICD-10-CM | POA: Diagnosis not present

## 2018-06-07 DIAGNOSIS — R600 Localized edema: Secondary | ICD-10-CM | POA: Diagnosis not present

## 2018-06-07 DIAGNOSIS — I5030 Unspecified diastolic (congestive) heart failure: Secondary | ICD-10-CM | POA: Diagnosis not present

## 2018-06-16 ENCOUNTER — Ambulatory Visit (HOSPITAL_COMMUNITY)
Admission: RE | Admit: 2018-06-16 | Discharge: 2018-06-16 | Disposition: A | Discharge status: Home / Self Care | Payer: Medicare Other | Source: Ambulatory Visit | Attending: Nephrology | Admitting: Nephrology

## 2018-06-16 VITALS — BP 160/94 | HR 99

## 2018-06-16 DIAGNOSIS — D631 Anemia in chronic kidney disease: Secondary | ICD-10-CM | POA: Insufficient documentation

## 2018-06-16 DIAGNOSIS — N184 Chronic kidney disease, stage 4 (severe): Secondary | ICD-10-CM | POA: Diagnosis not present

## 2018-06-16 LAB — CBC WITH DIFFERENTIAL/PLATELET
Abs Immature Granulocytes: 0.1 10*3/uL (ref 0.0–0.1)
Basophils Absolute: 0.1 10*3/uL (ref 0.0–0.1)
Basophils Relative: 1 %
Eosinophils Absolute: 0.1 10*3/uL (ref 0.0–0.7)
Eosinophils Relative: 1 %
HCT: 37.9 % (ref 36.0–46.0)
Hemoglobin: 10.5 g/dL — ABNORMAL LOW (ref 12.0–15.0)
Immature Granulocytes: 1 %
Lymphocytes Relative: 20 %
Lymphs Abs: 2.5 10*3/uL (ref 0.7–4.0)
MCH: 25.7 pg — ABNORMAL LOW (ref 26.0–34.0)
MCHC: 27.7 g/dL — ABNORMAL LOW (ref 30.0–36.0)
MCV: 92.9 fL (ref 78.0–100.0)
Monocytes Absolute: 1.2 10*3/uL — ABNORMAL HIGH (ref 0.1–1.0)
Monocytes Relative: 9 %
Neutro Abs: 8.9 10*3/uL — ABNORMAL HIGH (ref 1.7–7.7)
Neutrophils Relative %: 68 %
Platelets: 219 10*3/uL (ref 150–400)
RBC: 4.08 MIL/uL (ref 3.87–5.11)
RDW: 15.4 % (ref 11.5–15.5)
WBC: 12.9 10*3/uL — ABNORMAL HIGH (ref 4.0–10.5)

## 2018-06-16 MED ORDER — DARBEPOETIN ALFA 60 MCG/0.3ML IJ SOSY
PREFILLED_SYRINGE | INTRAMUSCULAR | Status: AC
  Filled 2018-06-16: qty 0.3

## 2018-06-16 MED ORDER — DARBEPOETIN ALFA 60 MCG/0.3ML IJ SOSY
60.0000 ug | PREFILLED_SYRINGE | INTRAMUSCULAR | Status: DC
  Administered 2018-06-16: 60 ug via SUBCUTANEOUS

## 2018-06-17 ENCOUNTER — Other Ambulatory Visit: Payer: Self-pay | Admitting: *Deleted

## 2018-06-17 ENCOUNTER — Encounter: Payer: Self-pay | Admitting: *Deleted

## 2018-06-17 LAB — PTH, INTACT AND CALCIUM
CALCIUM TOTAL (PTH): 8.3 mg/dL — AB (ref 8.7–10.3)
PTH: 220 pg/mL — AB (ref 15–65)

## 2018-06-17 NOTE — Patient Outreach (Signed)
[place] [facility]) Care Management [place] hospital discharge day 31  06/17/2018  Melissa Bartlett 1946/05/11 [id number]  Successful telephone outreach to Melissa Bartlett, 72 y.o.femalereferred to [place] after recent hospitalization May 17-20, 2019 for Acute respiratory Failure, weakness, fatigue, shortness of breath, secondary to CHF exacerbation with pulmonary edema; patient noted to have episode of V-tach during hospitalization.Patient has history including, but not limited to, HTN; morbid obesity; CKD-stage IV; OSA with CPAP; Atrial Fibrillation; CHF; Rheumatoid arthritis; and DM. HIPAA/ identity verified with patientduring phone call today.  Today, patient reports that she "is doing really good" and shedenies pain, new/ recent falls, and isin no obvious distress throughout entirety of phone call.  Patient further reports:  -- Has all medicationsand takes as prescribed;denies questionsaroundcurrent medications, using pill planner box provided to her at time of [place] initial home visit; states new pill box continuing to "help a lot, makes medications easier."  Again reports has now started newly prescribed magnesium.  Reports today that she scheduled and attended recent PCP office visit 06/07/18, where she was placed on antibiotics for nasal congestion and cough; reports antibiotics "will be completed tomorrow," and also states that she is using OTC Mucinex as needed-- reports adherence to antibiotics and Mucinex and states she is 'much better" since attending PCP office visit  -- provider appointments:  previously reported that she was told by kidney/ renal provider that she would need to be scheduled soon for placement of AV fistula for possibility of future need for hemodialysis (HD); verbalizes plans to attend scheduled for July 08, 2018 appointment for evaluation prior to procedure; all other upcoming provider  appointments reviewed with patient today:  Reports eye provider appointment on July 27, 2018 and Aug 03, 2018 rheumatology appointment; patient confirms that she has transportation to all appointments and verbalizes plans to attend all.   Self-health management ofchronic disease state of RKY:[id number] provided and reviewed with patient today using teach- back method: see care plan below for specifics --continues monitoring/ recording daily weights; previously reported weight ranges 238- 242 lbs; reports intermittent weight fluctuations "all less than 2 lbs overnight;" states that her weights have increased "slightly" with her episode of recent nasal congestion; reports weight this morning of 244.4 lbs, with yesterday's weight of 245 lbs -- denies issues/ concerns around breathing status/ LE edema; states she "is in green zone" today; has continued reviewing previously provided education around self-health management of chronic disease state of CHF; able to verbalize signs/ symptoms yellow CHF zone with minimal prompting-- signs/ symptoms yellow zone along with weight gain guidelines in setting of CHF all re-iterated/ re-reviewed with patient today -- continues using CPAP as ordered q HS -- continues monitoring/ recording blood pressures, endorses "all looking good" and denies concerns around same -- states has begun increasing activity by walking inside; states walking "about a mile every day;" positive reinforcement provided  Patient denies further issues, concerns, or problems today. Iconfirmed that patient hasmy direct phone number, the main [facility] CM office phone number, and the [facility] CM 24-hour nurse advice phone number should issues arise prior to next scheduled [place] scheduled routine home visit in 3 weeks. Encouraged patient to contact me directly or to contact [facility] CM main office, if needs, questions, issues, or concerns arise prior to next scheduled outreach; patient  agreed to do so.  Plan:  Patient will take medications as prescribed and will attend all schedule provider appointments post-hospital  discharge  Patient will promptly notify care providers for any new concerns/ issues/ problems that arise  Patient will continue to monitor and record daily weights  Patient will review printed educational material previously provided to her with focus on yellow CHF/ AF zones with corresponding action plans, as indicated  [facility] CM outreach to continue with scheduledroutine home visit in 3 weeks  [facility] CM Care Plan Problem One     Most Recent Value  Care Plan Problem One  Risk for hospital readmission related to/ as evidenced by recent hospitalization May 17-20, 2019 for ARF due to CHF exacerbation  Role Documenting the Problem One  Care Management Coordinator  Care Plan for Problem One  Not Active  [facility] Long Term Goal   Over the next 31 days, patient will not experience hospital re-admission, as evidenced by patient reporting and review of EMR during [facility] RN CCM outreach  [place] Term Goal Start Date  05/21/18  [facility] Long Term Goal Met Date  06/17/18- goal met; patient did not experience hospital re-admission within 31 days of last discharge  Interventions for Problem One Long Term Goal  Confirmed that patient did not experience hospital re-admission within 31 days of last discharge,  positive reinforcement provided  [facility] CM Short Term Goal #1   Over the next 30 days, patient will attend all scheduled provider appointments, as evidenced by patient reporting and collaboration with care providers, as indicated, during [facility] RN CCM outreach  [facility] CM Short Term Goal #1 Start Date  05/21/18  [facility] CM Short Term Goal #1 Met Date  06/17/18 - Goal met  Interventions for Short Term Goal #1  Confirmed that patient has attended all scheduled provider office visits post-hospital discharge and provided positive reinforcement for patient contacting her PCP and  scheduling and attending office visit for recent symptoms of chest congestion/ nasal drainage    [facility] CM Care Plan Problem Two     Most Recent Value  Care Plan Problem Two  Knowledge deficit regarding self-health management of chronic disease states of CHF and A-Fib, as evidenced by patient reporting of same   Role Documenting the Problem Two  Care Management Coordinator  Care Plan for Problem Two  Active  Interventions for Problem Two Long Term Goal   Discussed with patient current clinical status,  recent and upcoming provider appointments,  confirmed that patient is monitoring and recording daily weights and reviewed recent daily weights with her,  reiterated previously provided education around CHF self-health management strategies, weight gain guidelines in setting of CHF and signs/ symptoms CHF yellow zone,  confirmed that patient is in green CHF,  scheduled next [place] RN CM routine home visit  [facility] Long Term Goal  Over the next 82 days, patient will be able to verbalize signs/ symptoms of yellow zones for CHF and AF, along with corresponding action plans, as evidenced by patient reporting of same during [place] CM outreach  [facility] Long Term Goal Start Date  05/27/18  [facility] CM Short Term Goal #1   Over the next 30 days, patient will take all medications as prescribed, as evidenced by patient reporting during [place] CCM outreach  [facility] CM Short Term Goal #1 Start Date  05/27/18  Interventions for Short Term Goal #2   Confirmed that patient continues using weekly 4-slot pill planner box, and that she does not have questions/ concerns or recent changes around medication regimine,  discussed recently prescribed antibiotic therapy after PCP office visit  06/07/18,  provided positive reinforcement for medication adherence post-hospital discharge     Oneta Rack, RN, BSN, [place] Coordinator [facility] Care Management  [phone]

## 2018-06-20 ENCOUNTER — Other Ambulatory Visit: Payer: Self-pay | Admitting: Cardiology

## 2018-07-06 ENCOUNTER — Encounter: Payer: Self-pay | Admitting: *Deleted

## 2018-07-06 ENCOUNTER — Other Ambulatory Visit: Payer: Self-pay | Admitting: *Deleted

## 2018-07-06 NOTE — Patient Outreach (Signed)
[facility]) Care Management  [place] CM Routine Home Visit 07/06/2018  Melissa Bartlett December 15, 1946 [id number]  Melissa Bartlett is an 72 y.o. female referred to [place] afterrecent hospitalization May 17-20, 2019 for Acute respiratory Failure, weakness, fatigue, shortness of breath, secondary to CHF exacerbation with pulmonary edema; patient noted to have episode of V-tach during hospitalization.Patient has history including, but not limited to, HTN; morbid obesity; CKD-stage IV; OSA with CPAP; Atrial Fibrillation; CHF; Rheumatoid arthritis; and DM. HIPAA/ identity verified with patientin person during home visit today; patient's husband is present in home for visit but does not actively participate.  Pleasant 50 minute home visit.  Today, patient reports that she "is doingokay" and shedenies pain, new/ recent falls, andisin no obvious distress throughout entirety of today's home visit; patient reports "increased fatigue and feels a lack of energy" over last week; patient is occasionally teary at onset of visit as we discuss upcoming appointment for evaluation for possible AV fistula placement, which we discussed extensively; patient was provided printed EMMI educational material on this procedure and we began a list of her questions for the vascular surgeon and for her renal provider; patient admits that she "has reservations" about the thought of actually having this procedure done; emotional support provided throughout visit today.  We also reviewed her BUN and Creatinine levels as trended over last several months and discussed basics of CKD; patient was encouraged to discuss her questions, fears, and apprehension with her renal provider and with vascular surgeon as she considers her options, and to expand on the list of questions we started today.  By end of visit, patient stated she "felt better about everything" and was no longer teary.  Subjective:  "I just  don't know if I would really want to ever have to go through hemodialysis; I am just not sure how I feel about that possibility, but I know I have reservations about it."  Assessment:  Melissa Bartlett continues to appear to be recuperating very well after her recent hospitalization, and she has been adherent to overall plan of care, attending all scheduled provider appointments, and taking her medications as prescribed.  Melissa Bartlett has been using her new system for managing her medications  Using mulit-slot weekly pill planner box, which has been working well.  Melissa Bartlett has a good understanding of daily self-health management strategies for mulitiple chronic disease states and is committed to reporting concerns, and maintaining her practice of monitoring and recording daily weights.  Melissa Bartlett is apprehensive about her upcoming appointment for evaluation of possible AV fistula placement.  Patient further reports:  -- Has all medicationsand takes as prescribed;denies questionsaroundcurrent medications,continues using pill planner box provided to her at time of [place] initial home visit; states new pill box "helps a lot."  Denies recent changes to medications; reports that she has only had to take "one" dose of "extra fluid pill" over last month for overnight weight gain "that came to close to 3 pounds."  Continues able to verbalize very good general understanding of the purpose, dosing, and scheduling of current medications.  Continues managing medications independently.  -- provider appointments: scheduled for evaluation for placement of AV fistula for possibility of future need for hemodialysis (HD); verbalizes plans to attend scheduled for July 08, 2018 appointment; verbalizes plan to make cardiology appointment "this afternoon." Husband continues providing transportation to provider appointments.  Self-health management ofchronic disease state of CHF: --continuesmonitoring/recordingdaily  weights;previously reported weight ranges 238- 242 lbs; reports intermittent  weight fluctuations "all less than 2 lbs overnight;" states that her weights have continued to increase "slightly" over last few weeks, but has gradually increased since hospital discharge; we reviewed all recorded daily weights over last month; at hospital discharge, patient's weight's were consistently in 240 lb range; over last month, this weight range has gradually increased to 246-249 lbs, without overnight increases > 2 lbs; Melissa Bartlett is concerned about this.  Reports weight this morning of 249.2 lbs lbs. -- Upon review of her recorded daily weights, one isolated day she had recorded 3.6 lb overnight weight gain; stated she took her "extra" dose of lasix, as advised by cardiology provider, and this was resolved. -- denies issues/ concerns around breathing status/ LE edema, reporting both at her "normal" baseline; states she "is in green zone" today; but acknowledges that she has 2 yellow zone symptoms: feeling that something isn't right, which she states may be apprehension around upcoming vascular evaluation for AV fistula; and increased fatigue over last few days; I encouraged patient to promptly schedule cardiology appointment, and she stated she would do so this afternoon -- able to verbalize signs/ symptoms yellow CHF zone with minimal prompting-- signs/ symptoms yellow zone along with weight gain guidelines in setting of CHF all re-iterated/ re-reviewed with patient today -- continues using CPAP as ordered q HS -- continues monitoring/ recording blood pressures "about once a week," these values were reviewed with patient today -- continues following heart healthy, low salt diet -- denies signs/ symptoms A-Fib  Patient denies further issues, concerns, or problems today. Iconfirmed that patient hasmy direct phone number, the main [facility] CM office phone number, and the [facility] CM 24-hour nurse advice phone number should issues  arise prior to next scheduled [place] scheduled phone call nextweek to follow up on appointment for AV fistula placement evaluation. Encouraged patient to contact me directly or to contact [facility] CM main office,if needs, questions, issues, or concerns arise prior to next scheduled outreach; patient agreed to do so.  Objective:    BP 120/70   Pulse 90   Resp 18   Wt 249 lb 3.2 oz (113 kg)   LMP  (LMP Unknown)   SpO2 91%   BMI 44.14 kg/m   Review of Systems  Constitutional: Positive for malaise/fatigue.  Respiratory: Negative.  Negative for cough, shortness of breath and wheezing.   Cardiovascular: Positive for leg swelling. Negative for chest pain and palpitations.       Baseline +1 bilateral LE  Gastrointestinal: Negative for abdominal pain and nausea.  Genitourinary: Positive for frequency and urgency.       Diuretic therapy  Musculoskeletal: Negative for falls and myalgias.  Neurological: Negative for dizziness.  Psychiatric/Behavioral: Negative for depression. The patient is not nervous/anxious.    Physical Exam  Constitutional: She is oriented to person, place, and time. She appears well-developed and well-nourished. No distress.  Cardiovascular: Normal rate, regular rhythm, normal heart sounds and intact distal pulses.  Respiratory: Effort normal and breath sounds normal. No respiratory distress. She has no wheezes. She has no rales.  GI: Soft. Bowel sounds are normal.  Musculoskeletal: She exhibits edema.  Neurological: She is alert and oriented to person, place, and time.  Skin: Skin is warm and dry. No erythema.  Psychiatric: She has a normal mood and affect. Her behavior is normal. Judgment and thought content normal.   Encounter Medications:   Outpatient Encounter Medications as of 07/06/2018  Medication Sig Note  . amLODipine (NORVASC) 5  MG tablet Take 1 tablet (5 mg total) by mouth daily.   . brimonidine-timolol (COMBIGAN) 0.2-0.5 % ophthalmic  solution Place 1 drop into both eyes 2 (two) times daily.   . Calcium Carbonate-Vitamin D3 (CALCIUM 600/VITAMIN D) 600-400 MG-UNIT TABS Take 1 tablet by mouth 2 (two) times daily.   . carvedilol (COREG) 25 MG tablet TAKE 1 TABLET BY MOUTH TWICE A DAY WITH A MEAL   . Darbepoetin Alfa (ARANESP) 60 MCG/0.3ML SOSY injection Inject 60 mcg every 30 (thirty) days into the skin. 05/27/2018: Took last on May 19, 2018  . diphenhydramine-acetaminophen (TYLENOL PM) 25-500 MG TABS tablet Take 1 tablet by mouth at bedtime.    . furosemide (LASIX) 20 MG tablet Take 3 tablets (60 mg total) by mouth 2 (two) times daily.   . hydrALAZINE (APRESOLINE) 50 MG tablet Take 1 tablet (50 mg total) by mouth 3 (three) times daily.   Marland Kitchen leflunomide (ARAVA) 10 MG tablet Take 10 mg by mouth daily.    . pantoprazole (PROTONIX) 40 MG tablet Take 1 tablet (40 mg total) by mouth daily.   . potassium chloride SA (KLOR-CON M20) 20 MEQ tablet Take 1 tablet (20 mEq total) by mouth daily.   . predniSONE (DELTASONE) 5 MG tablet Take 5 mg by mouth daily.    Marland Kitchen PRESCRIPTION MEDICATION Inhale into the lungs at bedtime. CPAP   . Probiotic Product (ALIGN PO) Take 1 tablet by mouth at bedtime.    . promethazine (PHENERGAN) 25 MG tablet Take 25 mg by mouth every 6 (six) hours as needed for nausea or vomiting.   . Rivaroxaban (XARELTO) 15 MG TABS tablet Take 1 tablet (15 mg total) daily with supper by mouth. (Patient taking differently: Take 15 mg by mouth at bedtime. )   . simvastatin (ZOCOR) 20 MG tablet Take 20 mg by mouth at bedtime.    . vitamin B-12 (CYANOCOBALAMIN) 1000 MCG tablet Take 1,000 mcg by mouth daily.    No facility-administered encounter medications on file as of 07/06/2018.    Functional Status:   In your present state of health, do you have any difficulty performing the following activities: 05/21/2018 05/14/2018  Hearing? N N  Vision? N N  Difficulty concentrating or making decisions? N N  Walking or climbing stairs? N N   Dressing or bathing? N N  Doing errands, shopping? Y N  Comment [employer] provides transportation Facilities manager and eating ? N -  Using the Toilet? N -  In the past six months, have you accidently leaked urine? N -  Do you have problems with loss of bowel control? N -  Managing your Medications? N -  Managing your Finances? N -  Housekeeping or managing your Housekeeping? N -  Some recent data might be hidden   Fall/Depression Screening:    Fall Risk  07/06/2018 06/17/2018 06/03/2018  Falls in the past year? (No Data) (No Data) (No Data)  Comment Denies new/ recent falls Patient denies new/ recent falls Denies new/ recent falls  Risk for fall due to : - - -  Risk for fall due to: Comment - - -   PHQ 2/9 Scores 05/27/2018  PHQ - 2 Score 0   Plan:  Patient will take medications as prescribed and will attend all schedule provider appointments post-hospital discharge  Patient will promptly notify care providers for any new concerns/ issues/ problems that arise  Patient will continue to monitor and record daily weightsand CHF zones  Patient will promptly  schedule cardiology office visit for concerns around gradual weight gain since hospital discharge  Patient will review printed educational materialprovided to her on AV fistula placement as indicated  [place] CM outreach to continue with scheduledphone call nextweek  [place] CM Care Plan Problem Two     Most Recent Value  Care Plan Problem Two  Knowledge deficit regarding self-health management of chronic disease states of CHF and A-Fib, as evidenced by patient reporting of same   Role Documenting the Problem Two  Care Management Coordinator  Care Plan for Problem Two  Active  Interventions for Problem Two Long Term Goal   Reviewed all patient's recently recorded daily weights and discussed weight trends over last month with patient,  reviewed previously provided education around CHF zones and corresponding action plans,   discussed upcoming possible procedure for AV fistula placement and provided and reviewed printed EMMI educational material using teach back method  [facility] Long Term Goal  Over the next 82 days, patient will be able to verbalize signs/ symptoms of yellow zones for CHF and AF, along with corresponding action plans, as evidenced by patient reporting of same during [place] CM outreach  [facility] Long Term Goal Start Date  05/27/18  [facility] CM Short Term Goal #1   Over the next 30 days, patient will take all medications as prescribed, as evidenced by patient reporting during [facility] RN CCM outreach  [facility] CM Short Term Goal #1 Start Date  05/27/18  [facility] CM Short Term Goal #1 Met Date   07/06/18-- Goal met  Interventions for Short Term Goal #2   Confirmed with patient that she is taking all medications as prescribed and that no recent medication changes  [facility] CM Short Term Goal #2   Over the next 14 days, patient will schedule cardiology provider appointment, as evidenced by patient reporting and review of EMR during [facility] RN CCM outreach  [facility] CM Short Term Goal #2 Start Date  07/06/18  Interventions for Short Term Goal #2  Discussed with patient weight trends post-hospital discharge, compared to recent weights     Oneta Rack, RN, BSN, [employer] Coordinator [facility] Care Management  [phone]

## 2018-07-08 ENCOUNTER — Other Ambulatory Visit: Payer: Self-pay

## 2018-07-08 ENCOUNTER — Other Ambulatory Visit: Payer: Self-pay | Admitting: *Deleted

## 2018-07-08 ENCOUNTER — Ambulatory Visit (HOSPITAL_COMMUNITY)
Admission: RE | Admit: 2018-07-08 | Discharge: 2018-07-08 | Disposition: A | Discharge status: Home / Self Care | Payer: Medicare Other | Source: Ambulatory Visit | Attending: Vascular Surgery | Admitting: Vascular Surgery

## 2018-07-08 ENCOUNTER — Ambulatory Visit (INDEPENDENT_AMBULATORY_CARE_PROVIDER_SITE_OTHER): Payer: Medicare Other | Admitting: Vascular Surgery

## 2018-07-08 ENCOUNTER — Encounter: Payer: Self-pay | Admitting: Vascular Surgery

## 2018-07-08 ENCOUNTER — Ambulatory Visit (INDEPENDENT_AMBULATORY_CARE_PROVIDER_SITE_OTHER)
Admission: RE | Admit: 2018-07-08 | Discharge: 2018-07-08 | Disposition: A | Discharge status: Home / Self Care | Payer: Medicare Other | Source: Ambulatory Visit | Attending: Vascular Surgery | Admitting: Vascular Surgery

## 2018-07-08 VITALS — BP 179/99 | HR 106 | Temp 97.1°F | Resp 20 | Ht 62.5 in | Wt 250.0 lb

## 2018-07-08 DIAGNOSIS — Z01812 Encounter for preprocedural laboratory examination: Secondary | ICD-10-CM

## 2018-07-08 DIAGNOSIS — N185 Chronic kidney disease, stage 5: Secondary | ICD-10-CM | POA: Insufficient documentation

## 2018-07-08 NOTE — H&P (View-Only) (Signed)
HISTORY AND PHYSICAL     CC:  In need of dialysis access Requesting Provider:  Donald Prose, MD  HPI: This is a 72 y.o. female with CKD 4 who is referred by Dr. Regis Bill for evaluation for permanent dialysis access.   She has a hx of PAF and is on Xarelto.  She also has diabetes and hypertension.  She has had a couple of occasions for ARF on CKD in the past but she is not yet on dialysis.  She has also been diagnosed with RA. She has a hx of CHF and hx of nonsustained Vtach due to hypomagnesemia.  She has BLE edema.  She denies claudication sx or non healing wounds.    She is right hand dominant.    The pt is on a statin for cholesterol management.  She is on a beta blocker and CCB for blood pressure management.    She quit smoking in 2017.    Past Medical History:  Diagnosis Date  . CHF (congestive heart failure) ([place])   . CKD (chronic kidney disease), stage IV ([place])   . Complication of anesthesia   . Diabetes ([place])   . HTN (hypertension)   . OSA (obstructive sleep apnea)    with AHI 11/hr with nocturnal hypoxemia   . PAF (paroxysmal atrial fibrillation) ([place])    s/p DCCV in 8/17  . RA (rheumatoid arthritis) ([place])     Past Surgical History:  Procedure Laterality Date  . ANKLE FUSION Right   . BREAST BIOPSY    . CARDIOVERSION N/A 08/15/2016   Procedure: CARDIOVERSION;  Surgeon: Jerline Pain, MD;  Location: [facility] ENDOSCOPY;  Service: Cardiovascular;  Laterality: N/A;  . CHOLECYSTECTOMY    . GALLBLADDER SURGERY    . HYSTEROTOMY     Patrtial  . HYSTEROTOMY     Complete  . KNEE ARTHROSCOPY    . LEFT HEART CATH AND CORONARY ANGIOGRAPHY N/A 11/16/2017   Procedure: LEFT HEART CATH AND CORONARY ANGIOGRAPHY;  Surgeon: Martinique, Peter M, MD;  Location: [place] CV LAB;  Service: Cardiovascular;  Laterality: N/A;    Allergies  Allergen Reactions  . Enalapril Other (See Comments)    Systemic reaction - jerking  . Codeine Itching  . Oxycodone Itching  . Vicodin  [Hydrocodone-Acetaminophen] Itching    Current Outpatient Medications  Medication Sig Dispense Refill  . brimonidine-timolol (COMBIGAN) 0.2-0.5 % ophthalmic solution Place 1 drop into both eyes 2 (two) times daily.    . Calcium Carbonate-Vitamin D3 (CALCIUM 600/VITAMIN D) 600-400 MG-UNIT TABS Take 1 tablet by mouth 2 (two) times daily.    . carvedilol (COREG) 25 MG tablet TAKE 1 TABLET BY MOUTH TWICE A DAY WITH A MEAL 180 tablet 2  . Darbepoetin Alfa (ARANESP) 60 MCG/0.3ML SOSY injection Inject 60 mcg every 30 (thirty) days into the skin.    Marland Kitchen diphenhydramine-acetaminophen (TYLENOL PM) 25-500 MG TABS tablet Take 1 tablet by mouth at bedtime.     . furosemide (LASIX) 20 MG tablet Take 3 tablets (60 mg total) by mouth 2 (two) times daily. 90 tablet 11  . leflunomide (ARAVA) 10 MG tablet Take 10 mg by mouth daily.     . pantoprazole (PROTONIX) 40 MG tablet Take 1 tablet (40 mg total) by mouth daily. 30 tablet 0  . potassium chloride SA (KLOR-CON M20) 20 MEQ tablet Take 1 tablet (20 mEq total) by mouth daily. 90 tablet 3  . predniSONE (DELTASONE) 5 MG tablet Take 5 mg by mouth daily.     Marland Kitchen  PRESCRIPTION MEDICATION Inhale into the lungs at bedtime. CPAP    . Probiotic Product (ALIGN PO) Take 1 tablet by mouth at bedtime.     . promethazine (PHENERGAN) 25 MG tablet Take 25 mg by mouth every 6 (six) hours as needed for nausea or vomiting.    . Rivaroxaban (XARELTO) 15 MG TABS tablet Take 1 tablet (15 mg total) daily with supper by mouth. (Patient taking differently: Take 15 mg by mouth at bedtime. ) 30 tablet 8  . simvastatin (ZOCOR) 20 MG tablet Take 20 mg by mouth at bedtime.   2  . vitamin B-12 (CYANOCOBALAMIN) 1000 MCG tablet Take 1,000 mcg by mouth daily.    Marland Kitchen amLODipine (NORVASC) 5 MG tablet Take 1 tablet (5 mg total) by mouth daily. 90 tablet 3  . hydrALAZINE (APRESOLINE) 50 MG tablet Take 1 tablet (50 mg total) by mouth 3 (three) times daily. 270 tablet 3   No current facility-administered  medications for this visit.     Family History  Problem Relation Age of Onset  . Hypertension Mother        Family HX Diabetes  . Stomach cancer Father     Social History   Socioeconomic History  . Marital status: Married    Spouse name: Not on file  . Number of children: Not on file  . Years of education: Not on file  . Highest education level: Not on file  Occupational History  . Not on file  Social Needs  . Financial resource strain: Not on file  . Food insecurity:    Worry: Not on file    Inability: Not on file  . Transportation needs:    Medical: Not on file    Non-medical: Not on file  Tobacco Use  . Smoking status: Former Smoker    Last attempt to quit: 04/08/2016    Years since quitting: 2.2  . Smokeless tobacco: Never Used  Substance and Sexual Activity  . Alcohol use: No    Alcohol/week: 0.0 oz  . Drug use: No  . Sexual activity: Not on file  Lifestyle  . Physical activity:    Days per week: Not on file    Minutes per session: Not on file  . Stress: Not on file  Relationships  . Social connections:    Talks on phone: Not on file    Gets together: Not on file    Attends religious service: Not on file    Active member of club or organization: Not on file    Attends meetings of clubs or organizations: Not on file    Relationship status: Not on file  . Intimate partner violence:    Fear of current or ex partner: Not on file    Emotionally abused: Not on file    Physically abused: Not on file    Forced sexual activity: Not on file  Other Topics Concern  . Not on file  Social History Narrative  . Not on file     REVIEW OF SYSTEMS:   [X]  denotes positive finding, [ ]  denotes negative finding Cardiac  Comments:  Chest pain or chest pressure:    Shortness of breath upon exertion:    Short of breath when lying flat:    Irregular heart rhythm: x Afib on Xarelto      Vascular    Pain in calf, thigh, or hip brought on by ambulation:    Pain in  feet at night that wakes you up from  your sleep:     Blood clot in your veins:    Leg swelling:         Pulmonary    Oxygen at home:    Productive cough:     Wheezing:         Neurologic    Sudden weakness in arms or legs:     Sudden numbness in arms or legs:     Sudden onset of difficulty speaking or slurred speech:    Temporary loss of vision in one eye:     Problems with dizziness:         Gastrointestinal    Blood in stool:     Vomited blood:         Genitourinary    Burning when urinating:     Blood in urine:        Psychiatric    Major depression:         Hematologic    Bleeding problems:    Problems with blood clotting too easily:        Skin    Rashes or ulcers:        Constitutional    Fever or chills:      PHYSICAL EXAMINATION:  Vitals:   07/08/18 1425 07/08/18 1431  BP: (!) 159/95 (!) 179/99  Pulse: (!) 106   Resp: 20   Temp: (!) 97.1 F (36.2 C)   SpO2: 93%    Vitals:   07/08/18 1425  Weight: 250 lb (113.4 kg)  Height: 5' 2.5" (1.588 m)   Body mass index is 45 kg/m.  General:  WDWN obese female in NAD; vital signs documented above Gait: Not observed HENT: WNL, normocephalic Pulmonary: normal non-labored breathing , without Rales, rhonchi,  wheezing Cardiac: irregular HR, without  Murmurs without carotid bruits Abdomen: soft, obese NT, no masses Skin: without rashes Vascular Exam/Pulses:  Right Left  Radial +palpable +palpable  Ulnar Unable to palpate  Unable to palpate   DP Unable to palpate  Unable to palpate   PT Unable to palpate  Unable to palpate   Extremities: without ischemic changes, without Gangrene , without cellulitis; without open wounds;  +BLE edema  Musculoskeletal: no muscle wasting or atrophy  Neurologic: A&O X 3;  No focal weakness or paresthesias are detected Psychiatric:  The pt has Normal affect.   Non-Invasive Vascular Imaging:   Bilateral upper extremity vein mapping 04/14/59: Right cephalic:   6.30ZS-0.10XN Right basilic:   2.35TD-3.22GU Left cephalic:  5.42HC-6.23JS Left basilic:  2.83TD-1.76HY  Upper extremity arterial duplex for dialysis access 07/08/18: Right: Brachial:  0.36-0.40cm (T) Radial:  0.18cm (T) Ulnar:  0.23cm (T)  Left: Brachial:  0.30cm (T) Radial:  0.23cm (T) Ulnar:  0.15cm (T)  Pt meds includes: Statin:  Yes.   Beta Blocker:  Yes.   Aspirin:  No. ACEI:  No. ARB:  No. CCB use:  Yes Other Antiplatelet/Anticoagulant:  Yes Xarelto for Afib   ASSESSMENT/PLAN:: 72 y.o. female with CKD 4 referred by Dr. Arty Baumgartner for HD access placement.    -pt is right hand dominant, however, the vein in her right arm is more adequate for a right brachial cephalic AV fistula.  Discussed procedure, steal sx and possible need for further procedures including superficialization.  She does have further questions for Dr. Arty Baumgartner and would like to have them answered prior to proceeding with surgery.  She will call to schedule once she has spoken to him.   -she will need to be off  of her Xarelto prior to the procedure.  She states when she has needed to stop her Xarelto in the past for procedures, she did not require bridging.     Leontine Locket, PA-C [facility] [phone]  Clinic MD:  Pt seen and examined with Dr. Oneida Alar  History and exam details as above.  Patient of right brachiocephalic AV fistula in the near future.  The patient wishes to discuss further with her nephrologist before scheduling.  We will need to negotiate her Xarelto perioperatively.  Risk benefits possible complications of procedure details including but not limited to bleeding infection non-maturation of the fistula ischemic steal were discussed with patient today.  She understands and agrees to proceed after she speaks with Dr. Arty Baumgartner.  Ruta Hinds, MD [facility] [place] Office: [phone] Pager: [phone]

## 2018-07-08 NOTE — Progress Notes (Signed)
HISTORY AND PHYSICAL     CC:  In need of dialysis access Requesting Provider:  Donald Prose, MD  HPI: This is a 72 y.o. female with CKD 4 who is referred by Dr. Regis Bill for evaluation for permanent dialysis access.   She has a hx of PAF and is on Xarelto.  She also has diabetes and hypertension.  She has had a couple of occasions for ARF on CKD in the past but she is not yet on dialysis.  She has also been diagnosed with RA. She has a hx of CHF and hx of nonsustained Vtach due to hypomagnesemia.  She has BLE edema.  She denies claudication sx or non healing wounds.    She is right hand dominant.    The pt is on a statin for cholesterol management.  She is on a beta blocker and CCB for blood pressure management.    She quit smoking in 2017.    Past Medical History:  Diagnosis Date  . CHF (congestive heart failure) ([place])   . CKD (chronic kidney disease), stage IV ([place])   . Complication of anesthesia   . Diabetes ([place])   . HTN (hypertension)   . OSA (obstructive sleep apnea)    with AHI 11/hr with nocturnal hypoxemia   . PAF (paroxysmal atrial fibrillation) ([place])    s/p DCCV in 8/17  . RA (rheumatoid arthritis) ([place])     Past Surgical History:  Procedure Laterality Date  . ANKLE FUSION Right   . BREAST BIOPSY    . CARDIOVERSION N/A 08/15/2016   Procedure: CARDIOVERSION;  Surgeon: Jerline Pain, MD;  Location: [facility] ENDOSCOPY;  Service: Cardiovascular;  Laterality: N/A;  . CHOLECYSTECTOMY    . GALLBLADDER SURGERY    . HYSTEROTOMY     Patrtial  . HYSTEROTOMY     Complete  . KNEE ARTHROSCOPY    . LEFT HEART CATH AND CORONARY ANGIOGRAPHY N/A 11/16/2017   Procedure: LEFT HEART CATH AND CORONARY ANGIOGRAPHY;  Surgeon: Martinique, Peter M, MD;  Location: [place] CV LAB;  Service: Cardiovascular;  Laterality: N/A;    Allergies  Allergen Reactions  . Enalapril Other (See Comments)    Systemic reaction - jerking  . Codeine Itching  . Oxycodone Itching  . Vicodin  [Hydrocodone-Acetaminophen] Itching    Current Outpatient Medications  Medication Sig Dispense Refill  . brimonidine-timolol (COMBIGAN) 0.2-0.5 % ophthalmic solution Place 1 drop into both eyes 2 (two) times daily.    . Calcium Carbonate-Vitamin D3 (CALCIUM 600/VITAMIN D) 600-400 MG-UNIT TABS Take 1 tablet by mouth 2 (two) times daily.    . carvedilol (COREG) 25 MG tablet TAKE 1 TABLET BY MOUTH TWICE A DAY WITH A MEAL 180 tablet 2  . Darbepoetin Alfa (ARANESP) 60 MCG/0.3ML SOSY injection Inject 60 mcg every 30 (thirty) days into the skin.    Marland Kitchen diphenhydramine-acetaminophen (TYLENOL PM) 25-500 MG TABS tablet Take 1 tablet by mouth at bedtime.     . furosemide (LASIX) 20 MG tablet Take 3 tablets (60 mg total) by mouth 2 (two) times daily. 90 tablet 11  . leflunomide (ARAVA) 10 MG tablet Take 10 mg by mouth daily.     . pantoprazole (PROTONIX) 40 MG tablet Take 1 tablet (40 mg total) by mouth daily. 30 tablet 0  . potassium chloride SA (KLOR-CON M20) 20 MEQ tablet Take 1 tablet (20 mEq total) by mouth daily. 90 tablet 3  . predniSONE (DELTASONE) 5 MG tablet Take 5 mg by mouth daily.     Marland Kitchen  PRESCRIPTION MEDICATION Inhale into the lungs at bedtime. CPAP    . Probiotic Product (ALIGN PO) Take 1 tablet by mouth at bedtime.     . promethazine (PHENERGAN) 25 MG tablet Take 25 mg by mouth every 6 (six) hours as needed for nausea or vomiting.    . Rivaroxaban (XARELTO) 15 MG TABS tablet Take 1 tablet (15 mg total) daily with supper by mouth. (Patient taking differently: Take 15 mg by mouth at bedtime. ) 30 tablet 8  . simvastatin (ZOCOR) 20 MG tablet Take 20 mg by mouth at bedtime.   2  . vitamin B-12 (CYANOCOBALAMIN) 1000 MCG tablet Take 1,000 mcg by mouth daily.    Marland Kitchen amLODipine (NORVASC) 5 MG tablet Take 1 tablet (5 mg total) by mouth daily. 90 tablet 3  . hydrALAZINE (APRESOLINE) 50 MG tablet Take 1 tablet (50 mg total) by mouth 3 (three) times daily. 270 tablet 3   No current facility-administered  medications for this visit.     Family History  Problem Relation Age of Onset  . Hypertension Mother        Family HX Diabetes  . Stomach cancer Father     Social History   Socioeconomic History  . Marital status: Married    Spouse name: Not on file  . Number of children: Not on file  . Years of education: Not on file  . Highest education level: Not on file  Occupational History  . Not on file  Social Needs  . Financial resource strain: Not on file  . Food insecurity:    Worry: Not on file    Inability: Not on file  . Transportation needs:    Medical: Not on file    Non-medical: Not on file  Tobacco Use  . Smoking status: Former Smoker    Last attempt to quit: 04/08/2016    Years since quitting: 2.2  . Smokeless tobacco: Never Used  Substance and Sexual Activity  . Alcohol use: No    Alcohol/week: 0.0 oz  . Drug use: No  . Sexual activity: Not on file  Lifestyle  . Physical activity:    Days per week: Not on file    Minutes per session: Not on file  . Stress: Not on file  Relationships  . Social connections:    Talks on phone: Not on file    Gets together: Not on file    Attends religious service: Not on file    Active member of club or organization: Not on file    Attends meetings of clubs or organizations: Not on file    Relationship status: Not on file  . Intimate partner violence:    Fear of current or ex partner: Not on file    Emotionally abused: Not on file    Physically abused: Not on file    Forced sexual activity: Not on file  Other Topics Concern  . Not on file  Social History Narrative  . Not on file     REVIEW OF SYSTEMS:   [X]  denotes positive finding, [ ]  denotes negative finding Cardiac  Comments:  Chest pain or chest pressure:    Shortness of breath upon exertion:    Short of breath when lying flat:    Irregular heart rhythm: x Afib on Xarelto      Vascular    Pain in calf, thigh, or hip brought on by ambulation:    Pain in  feet at night that wakes you up from  your sleep:     Blood clot in your veins:    Leg swelling:         Pulmonary    Oxygen at home:    Productive cough:     Wheezing:         Neurologic    Sudden weakness in arms or legs:     Sudden numbness in arms or legs:     Sudden onset of difficulty speaking or slurred speech:    Temporary loss of vision in one eye:     Problems with dizziness:         Gastrointestinal    Blood in stool:     Vomited blood:         Genitourinary    Burning when urinating:     Blood in urine:        Psychiatric    Major depression:         Hematologic    Bleeding problems:    Problems with blood clotting too easily:        Skin    Rashes or ulcers:        Constitutional    Fever or chills:      PHYSICAL EXAMINATION:  Vitals:   07/08/18 1425 07/08/18 1431  BP: (!) 159/95 (!) 179/99  Pulse: (!) 106   Resp: 20   Temp: (!) 97.1 F (36.2 C)   SpO2: 93%    Vitals:   07/08/18 1425  Weight: 250 lb (113.4 kg)  Height: 5' 2.5" (1.588 m)   Body mass index is 45 kg/m.  General:  WDWN obese female in NAD; vital signs documented above Gait: Not observed HENT: WNL, normocephalic Pulmonary: normal non-labored breathing , without Rales, rhonchi,  wheezing Cardiac: irregular HR, without  Murmurs without carotid bruits Abdomen: soft, obese NT, no masses Skin: without rashes Vascular Exam/Pulses:  Right Left  Radial +palpable +palpable  Ulnar Unable to palpate  Unable to palpate   DP Unable to palpate  Unable to palpate   PT Unable to palpate  Unable to palpate   Extremities: without ischemic changes, without Gangrene , without cellulitis; without open wounds;  +BLE edema  Musculoskeletal: no muscle wasting or atrophy  Neurologic: A&O X 3;  No focal weakness or paresthesias are detected Psychiatric:  The pt has Normal affect.   Non-Invasive Vascular Imaging:   Bilateral upper extremity vein mapping 0/09/38: Right cephalic:   1.82XH-3.71IR Right basilic:   6.78LF-8.10FB Left cephalic:  5.10CH-8.52DP Left basilic:  8.24MP-5.36RW  Upper extremity arterial duplex for dialysis access 07/08/18: Right: Brachial:  0.36-0.40cm (T) Radial:  0.18cm (T) Ulnar:  0.23cm (T)  Left: Brachial:  0.30cm (T) Radial:  0.23cm (T) Ulnar:  0.15cm (T)  Pt meds includes: Statin:  Yes.   Beta Blocker:  Yes.   Aspirin:  No. ACEI:  No. ARB:  No. CCB use:  Yes Other Antiplatelet/Anticoagulant:  Yes Xarelto for Afib   ASSESSMENT/PLAN:: 72 y.o. female with CKD 4 referred by Dr. Arty Baumgartner for HD access placement.    -pt is right hand dominant, however, the vein in her right arm is more adequate for a right brachial cephalic AV fistula.  Discussed procedure, steal sx and possible need for further procedures including superficialization.  She does have further questions for Dr. Arty Baumgartner and would like to have them answered prior to proceeding with surgery.  She will call to schedule once she has spoken to him.   -she will need to be off  of her Xarelto prior to the procedure.  She states when she has needed to stop her Xarelto in the past for procedures, she did not require bridging.     Leontine Locket, PA-C [facility] [phone]  Clinic MD:  Pt seen and examined with Dr. Oneida Alar  History and exam details as above.  Patient of right brachiocephalic AV fistula in the near future.  The patient wishes to discuss further with her nephrologist before scheduling.  We will need to negotiate her Xarelto perioperatively.  Risk benefits possible complications of procedure details including but not limited to bleeding infection non-maturation of the fistula ischemic steal were discussed with patient today.  She understands and agrees to proceed after she speaks with Dr. Arty Baumgartner.  Ruta Hinds, MD [facility] [place] Office: [phone] Pager: [phone]

## 2018-07-09 ENCOUNTER — Telehealth: Payer: Self-pay | Admitting: Cardiology

## 2018-07-09 NOTE — Telephone Encounter (Signed)
Error

## 2018-07-12 ENCOUNTER — Encounter: Payer: Self-pay | Admitting: *Deleted

## 2018-07-12 ENCOUNTER — Other Ambulatory Visit: Payer: Self-pay | Admitting: *Deleted

## 2018-07-12 NOTE — Patient Outreach (Signed)
[place] [facility]) Care Management [place] Telephone Outreach Post-hospital discharge day 56  07/12/2018  Melissa Bartlett Dec 12, 1946 [id number]  Successful outgoing telephone call to Melissa Bartlett, 72 y.o. female referred to [place] afterrecent hospitalization May 17-20, 2019 for Acute respiratory Failure, weakness, fatigue, shortness of breath, secondary to CHF exacerbation with pulmonary edema; patient noted to have episode of V-tach during hospitalization.Patient has history including, but not limited to, HTN; morbid obesity; CKD-stage IV; OSA with CPAP; Atrial Fibrillation; CHF; Rheumatoid arthritis; and DM. HIPAA/ identity verified with patientduring phone call today.  Today, patient reports that she "is doing great," and that she "feels much better about everything" than she did at last [place] outreach last week, where she verbalized fear/ concern around upcoming appointment for evaluation for AV fistula placement; stated that she attended this appointment, and before making a decision about whether or not to have the fistula placed, contacted her renal provider and discussed her fears/ concerns with the office nurse, who "relieved" her anxiety.  Patient confirms that she has agreed to have the AV fistula placed, for "possible need" in the future, on Wednesday 07/14/18.  Patient denies pain, new/ recent falls, and she sounds to be in no distress throughout entirety of phone call today.  Patient further reports:  -- Has all medicationsand takes as prescribed;denies questionsaroundcurrent medications,confirms that she has stopped Xarelto for her upcoming procedure for AV fistula; stated "stopped last night," (Sunday); during phone call, patient stated that she was going through the paperwork post- vascular office visit, and noted that she "was supposed to stop the [place] on Monday," (not Sunday); patient questions as to whether she should have  stopped it last night-- I advised patient to be sure to inform the short-stay intake nurse/ vascular surgeon before the procedure, that she stopped this medication on Sunday evening, rather than on Monday-- and also advised her to not take this medication tonight, as the instructions she has clearly state that she should not take on Monday 7/15 or Tuesday 7/16.  Patient agreed to to inform outpatient procedure team that she stopped the Xarelto on Sunday 07/11/18.  -- provider appointments: Patient scheduled cardiology appointment post- last [place] home visit last week, as she agreed to; scheduled for August 24, 2018; Renal provider office visit scheduled August 25, 2018; eye doctor appointment July 27, 2018; Procedure for AV fistula placement scheduled for July 14, 2018 as noted above.  Husband continues providing transportation to provider appointments.  Self-health management ofchronic disease state of CHF: --continuesmonitoring/recordingdaily weights;previouslyreportedweight ranges 238- 242 lbs; reports intermittentweight fluctuations "all less than 2 lbs overnight;" -- last week, we discussed that patient's weights have continued to gradually increase over last few weeks, without any overnight gains > 3 lbs, with new weight range noted at 246-249 lbs -- today's weight: 248 lbs, with weights since last [place] visit on 07/06/18 ranging from 246-248 consistently -- denies recent need for "extra" lasix/ diuretic  -- continues to deny issues/ concerns around breathing status/ LE edema, reporting both at her "normal" baseline; states she "is in green zone" today -- continues able to verbalize signs/ symptoms yellow CHF zone with minimal prompting-- signs/ symptoms yellow zone along with weight gain guidelines in setting of CHF all re-iterated/ re-reviewed with patient today -- continues to deny signs/ symptoms A-Fib  Patient denies further issues, concerns, or  problems today. Iconfirmed that patient hasmy direct phone number, the main [facility]  CM office phone number, and the [facility] CM 24-hour nurse advice phone number should issues arise prior to next scheduled [place] scheduled phone call in 2- 3 weeks. Encouraged patient to contact me directly or to contact [facility] CM main office,if needs, questions, issues, or concerns arise prior to next scheduled outreach; patient agreed to do so.  Plan:  Patient will take medications as prescribed and will attend all schedule provider appointments post-hospital discharge  Patient will promptly notify care providers for any new concerns/ issues/ problems that arise  Patient will continue to monitor and record daily weightsand CHF zones  [facility] Community CM outreach to continue with scheduledphone call nextweek  [facility] CM Care Plan Problem Two     Most Recent Value  Care Plan Problem Two  Knowledge deficit regarding self-health management of chronic disease states of CHF and A-Fib, as evidenced by patient reporting of same   Role Documenting the Problem Two  Care Management Coordinator  Care Plan for Problem Two  Active  Interventions for Problem Two Long Term Goal   Discussed with patient her current clinical status, recent evaluation for AV fistula placement, and upcoming scheduled outpatient appointment to have this completed,  confirmed that patient has continued monitoring and recording daily weights and is able to verbalize weight gain guidelines in CHF and signs/ symptoms CHF yellow zone, along with action plan for both,  reviewed patient's recently recorded weights at home with her  [facility] Long Term Goal  Over the next 82 days, patient will be able to verbalize signs/ symptoms of yellow zones for CHF and AF, along with corresponding action plans, as evidenced by patient reporting of same during [place] CM outreach  [facility] Long Term Goal Start Date  05/27/18  [facility] CM Short Term Goal #2    Over the next 14 days, patient will schedule cardiology provider appointment, as evidenced by patient reporting and review of EMR during [facility] RN CCM outreach  [facility] CM Short Term Goal #2 Start Date  07/06/18  [facility] CM Short Term Goal #2 Met Date  07/12/18 - Goal Met  Interventions for Short Term Goal #2  Confirmed that patient scheduled next cardiology appointment,  positive reinforcement provided     Oneta Rack, RN, BSN, [place] Coordinator [facility] Care Management  [phone]

## 2018-07-13 ENCOUNTER — Ambulatory Visit: Payer: Medicare Other | Admitting: *Deleted

## 2018-07-13 ENCOUNTER — Other Ambulatory Visit: Payer: Self-pay

## 2018-07-13 ENCOUNTER — Encounter (HOSPITAL_COMMUNITY): Payer: Self-pay | Admitting: *Deleted

## 2018-07-13 ENCOUNTER — Encounter (HOSPITAL_COMMUNITY): Payer: Medicare Other

## 2018-07-13 NOTE — Progress Notes (Signed)
Anesthesia Chart Review: same day workup   Case:  [id number] Date/Time:  07/14/18 0815   Procedure:  ARTERIOVENOUS (AV) FISTULA CREATION BRACHIOCEPHALIC (Right )   Anesthesia type:  Monitor Anesthesia Care   Pre-op diagnosis:  chronic kidney disease stage 5   [facility]:  [facility] OR ROOM 48 / [facility] OR   Surgeon:  Elam Dutch, MD      DISCUSSION: 72 yo female former smoker scheduled for above procedure. Pertinent medical hx includes HTN; morbid obesity; ESRD on yet on HD; OSA with CPAP; Permanent Atrial Fibrillation; CHF; Anemia; Rheumatoid arthritis; and DM.  Pt recently hospitalized 5/17-5/20/2019 for acute respiratory failure secondary to acute on chronic diastolic CHF, symptoms markedly improved after diuresis. Her BNP on admission was 869, troponins were negative. While hospitalized she had a 19 beat run of NSVT which was felt to be due to hypoMg, she was given 4mg  of mag sulfate and serum Mg improved 1.0>>2.3. She also had mild hypoK of 3.2 that was repleted to 4.7.  Pt to stop Xarelto 07/12/2018 per Dr. Oneida Alar.  Anticipate pt can proceed with surgery as scheduled barring acute status change  PROVIDERS: Donald Prose, MD is PCP  Candee Furbish, MD is Cardiologist last seen 05/19/2018 after her most recent admission, her weight at that appt was 242lb.  Her weight at discharge on 05/17/2018 was 241lb  LABS: Will need labs DOS  Lab Results  Component Value Date   WBC 12.9 (H) 06/16/2018   HGB 10.5 (L) 06/16/2018   HCT 37.9 06/16/2018   PLT 219 06/16/2018   GLUCOSE 160 (H) 05/17/2018   CHOL 150 07/06/2011   TRIG 123 07/06/2011   HDL 46 07/06/2011   LDLCALC 79 07/06/2011   ALT 13 (L) 05/17/2018   AST 9 (L) 05/17/2018   NA 140 05/17/2018   K 4.7 05/17/2018   CL 100 (L) 05/17/2018   CREATININE 3.74 (H) 05/17/2018   BUN 49 (H) 05/17/2018   CO2 28 05/17/2018   TSH 1.301 05/14/2018   INR 1.13 05/14/2018   HGBA1C 5.9 (H) 05/16/2018   Magnesium (mg/dL)  Date Value  05/16/2018 2.3   05/16/2018 1.0 (L)  05/14/2018 1.0 (L)     IMAGES: CHEST - 2 VIEW 05/14/2018 CLINICAL DATA:  Acute shortness of breath. COMPARISON:  01/27/2018 and prior radiographs  FINDINGS: This is a low volume film with cardiomegaly again noted.  Pulmonary vascular congestion is present.  Pleural fluid within the RIGHT major and minor fissure noted as well as continued mild bibasilar atelectasis.  There is no evidence of pneumothorax.  No acute bony abnormalities identified.  IMPRESSION: Cardiomegaly with pulmonary vascular congestion.  Little interval change in RIGHT fissural pleural fluid and mild bibasilar atelectasis.   CHEST  2 VIEW 01/27/2018 CLINICAL DATA:  Shortness of Breath COMPARISON:  10/27/2017  FINDINGS: Cardiomegaly. Small right effusion including fluid in the fissures. Right base opacity likely reflects atelectasis. No confluent opacity on the left. No overt edema.  IMPRESSION: Right pleural effusion including fissural fluid. Right base atelectasis.  Cardiomegaly.    EKG: 05/18/2018: Afib with rapid ventricular response, rate 120. Anterior infarct, age undetermined. Ventricular rate increased compared to prior EKG 02/11/2018, otherwise minimal interval change.  CV: ECHO 01/28/2018: Study Conclusions  - Left ventricle: The cavity size was normal. Wall thickness was   increased in a pattern of mild LVH. The estimated ejection   fraction was 55%. The study is not technically sufficient to   allow evaluation of LV diastolic function. -  Aortic valve: There was mild regurgitation. - Mitral valve: There was mild to moderate regurgitation. - Left atrium: The atrium was mildly dilated. - Right atrium: The atrium was mildly dilated. - Pulmonary arteries: PA peak pressure: 40 mm Hg (S).  Left Heart Cath 11/16/2017: Left Main  Vessel was injected. Vessel is normal in caliber. Vessel is angiographically normal.  Left Anterior Descending  Vessel  was injected. Vessel is normal in caliber. Vessel is angiographically normal.  Ramus Intermedius  Vessel was injected. Vessel is normal in caliber. Vessel is angiographically normal.  Left Circumflex  Vessel was injected. Vessel is normal in caliber. Vessel is angiographically normal.  Right Coronary Artery  Vessel was injected. Vessel is normal in caliber. Vessel is angiographically normal.   Conclusion;  LV end diastolic pressure is mildly elevated.   1. Normal coronary angiography 2. Elevated LVEDP  Plan: medical therapy.  Nuclear Stress 11/03/2017  Nuclear stress EF: 55%.  There was no ST segment deviation noted during stress.  This is a low risk study.  The left ventricular ejection fraction is normal (55-65%).   1. EF 55%, normal wall motion.  2. Primarily fixed, medium-sized mild mid to apical anterior perfusion defect.  Prominent breast shadow on planar images, defect may be due to attenuation though cannot rule out prior infarction with mild peri-infarct ischemia.   Low risk study overall.   ECHO 03/15/2017: Study Conclusions  - Left ventricle: The cavity size was normal. There was mild   concentric hypertrophy. Systolic function was vigorous. The   estimated ejection fraction was in the range of 65% to 70%. Wall   motion was normal; there were no regional wall motion   abnormalities. Doppler parameters are consistent with high   ventricular filling pressure. - Aortic valve: There was mild regurgitation. - Mitral valve: There was trivial regurgitation. Valve area by   continuity equation (using LVOT flow): 2.49 cm^2. - Left atrium: The atrium was moderately dilated. - Pulmonary arteries: Systolic pressure could not be accurately   estimated.   Past Medical History:  Diagnosis Date  . CHF (congestive heart failure) ([place])   . CKD (chronic kidney disease), stage IV ([place])   . Complication of anesthesia   . Diabetes ([place])   . HTN (hypertension)   . OSA  (obstructive sleep apnea)    with AHI 11/hr with nocturnal hypoxemia   . PAF (paroxysmal atrial fibrillation) ([place])    s/p DCCV in 8/17  . RA (rheumatoid arthritis) ([place])     Past Surgical History:  Procedure Laterality Date  . ANKLE FUSION Right   . BREAST BIOPSY    . CARDIOVERSION N/A 08/15/2016   Procedure: CARDIOVERSION;  Surgeon: Jerline Pain, MD;  Location: [facility] ENDOSCOPY;  Service: Cardiovascular;  Laterality: N/A;  . CHOLECYSTECTOMY    . GALLBLADDER SURGERY    . HYSTEROTOMY     Patrtial  . HYSTEROTOMY     Complete  . KNEE ARTHROSCOPY    . LEFT HEART CATH AND CORONARY ANGIOGRAPHY N/A 11/16/2017   Procedure: LEFT HEART CATH AND CORONARY ANGIOGRAPHY;  Surgeon: Martinique, Peter M, MD;  Location: [place] CV LAB;  Service: Cardiovascular;  Laterality: N/A;    MEDICATIONS: No current facility-administered medications for this encounter.    Marland Kitchen amLODipine (NORVASC) 5 MG tablet  . brimonidine-timolol (COMBIGAN) 0.2-0.5 % ophthalmic solution  . Calcium Carbonate-Vitamin D3 (CALCIUM 600/VITAMIN D) 600-400 MG-UNIT TABS  . carvedilol (COREG) 25 MG tablet  . Darbepoetin Alfa (ARANESP) 60 MCG/0.3ML SOSY injection  .  diphenhydramine-acetaminophen (TYLENOL PM) 25-500 MG TABS tablet  . furosemide (LASIX) 20 MG tablet  . hydrALAZINE (APRESOLINE) 50 MG tablet  . leflunomide (ARAVA) 10 MG tablet  . Magnesium Oxide 400 (240 Mg) MG TABS  . pantoprazole (PROTONIX) 40 MG tablet  . potassium chloride SA (KLOR-CON M20) 20 MEQ tablet  . predniSONE (DELTASONE) 5 MG tablet  . Probiotic Product (ALIGN PO)  . Rivaroxaban (XARELTO) 15 MG TABS tablet  . simvastatin (ZOCOR) 20 MG tablet  . vitamin B-12 (CYANOCOBALAMIN) 1000 MCG tablet  . [place], PA-C [facility] Short Stay Center/Anesthesiology Phone [phone] 07/13/2018 10:24 AM

## 2018-07-13 NOTE — Progress Notes (Signed)
Pt denies any acute cardiopulmonary issues. Pt under the care of Dr.Skains, Cardiology. Pt stated that last dose of Xarelto was Sunday, 07/11/18 as instructed by MD. Pt made aware to stop taking vitamins, fish oil, Probiotics and herbal medications. Do not take any NSAIDs ie: Ibuprofen, Advil, Naproxen (Aleve), Motrin, BC and Goody Powder. Pt verbalized understanding of all pre-op instructions. See anesthesia note.

## 2018-07-14 ENCOUNTER — Ambulatory Visit (HOSPITAL_COMMUNITY): Payer: Medicare Other | Admitting: Physician Assistant

## 2018-07-14 ENCOUNTER — Encounter (HOSPITAL_COMMUNITY): Payer: Medicare Other

## 2018-07-14 ENCOUNTER — Ambulatory Visit (HOSPITAL_COMMUNITY)
Admission: RE | Admit: 2018-07-14 | Discharge: 2018-07-14 | Disposition: A | Discharge status: Home / Self Care | Payer: Medicare Other | Source: Ambulatory Visit | Attending: Vascular Surgery | Admitting: Vascular Surgery

## 2018-07-14 ENCOUNTER — Telehealth: Payer: Self-pay | Admitting: Vascular Surgery

## 2018-07-14 ENCOUNTER — Encounter (HOSPITAL_COMMUNITY)
Admission: RE | Disposition: A | Discharge status: Home / Self Care | Payer: Self-pay | Source: Ambulatory Visit | Attending: Vascular Surgery

## 2018-07-14 ENCOUNTER — Encounter (HOSPITAL_COMMUNITY): Payer: Self-pay

## 2018-07-14 DIAGNOSIS — D631 Anemia in chronic kidney disease: Secondary | ICD-10-CM | POA: Insufficient documentation

## 2018-07-14 DIAGNOSIS — Z888 Allergy status to other drugs, medicaments and biological substances status: Secondary | ICD-10-CM | POA: Diagnosis not present

## 2018-07-14 DIAGNOSIS — Z981 Arthrodesis status: Secondary | ICD-10-CM | POA: Diagnosis not present

## 2018-07-14 DIAGNOSIS — I130 Hypertensive heart and chronic kidney disease with heart failure and stage 1 through stage 4 chronic kidney disease, or unspecified chronic kidney disease: Secondary | ICD-10-CM | POA: Diagnosis not present

## 2018-07-14 DIAGNOSIS — Z6841 Body Mass Index (BMI) 40.0 and over, adult: Secondary | ICD-10-CM | POA: Diagnosis not present

## 2018-07-14 DIAGNOSIS — I132 Hypertensive heart and chronic kidney disease with heart failure and with stage 5 chronic kidney disease, or end stage renal disease: Secondary | ICD-10-CM | POA: Diagnosis not present

## 2018-07-14 DIAGNOSIS — Z87891 Personal history of nicotine dependence: Secondary | ICD-10-CM | POA: Diagnosis not present

## 2018-07-14 DIAGNOSIS — I480 Paroxysmal atrial fibrillation: Secondary | ICD-10-CM | POA: Insufficient documentation

## 2018-07-14 DIAGNOSIS — Z90710 Acquired absence of both cervix and uterus: Secondary | ICD-10-CM | POA: Insufficient documentation

## 2018-07-14 DIAGNOSIS — Z9889 Other specified postprocedural states: Secondary | ICD-10-CM | POA: Diagnosis not present

## 2018-07-14 DIAGNOSIS — I5031 Acute diastolic (congestive) heart failure: Secondary | ICD-10-CM | POA: Diagnosis not present

## 2018-07-14 DIAGNOSIS — G4733 Obstructive sleep apnea (adult) (pediatric): Secondary | ICD-10-CM | POA: Diagnosis not present

## 2018-07-14 DIAGNOSIS — Z79899 Other long term (current) drug therapy: Secondary | ICD-10-CM | POA: Insufficient documentation

## 2018-07-14 DIAGNOSIS — Z7901 Long term (current) use of anticoagulants: Secondary | ICD-10-CM | POA: Diagnosis not present

## 2018-07-14 DIAGNOSIS — E669 Obesity, unspecified: Secondary | ICD-10-CM | POA: Insufficient documentation

## 2018-07-14 DIAGNOSIS — Z955 Presence of coronary angioplasty implant and graft: Secondary | ICD-10-CM | POA: Diagnosis not present

## 2018-07-14 DIAGNOSIS — Z885 Allergy status to narcotic agent status: Secondary | ICD-10-CM | POA: Insufficient documentation

## 2018-07-14 DIAGNOSIS — E1122 Type 2 diabetes mellitus with diabetic chronic kidney disease: Secondary | ICD-10-CM | POA: Diagnosis not present

## 2018-07-14 DIAGNOSIS — I509 Heart failure, unspecified: Secondary | ICD-10-CM | POA: Insufficient documentation

## 2018-07-14 DIAGNOSIS — Z833 Family history of diabetes mellitus: Secondary | ICD-10-CM | POA: Insufficient documentation

## 2018-07-14 DIAGNOSIS — N179 Acute kidney failure, unspecified: Secondary | ICD-10-CM | POA: Diagnosis not present

## 2018-07-14 DIAGNOSIS — Z9049 Acquired absence of other specified parts of digestive tract: Secondary | ICD-10-CM | POA: Diagnosis not present

## 2018-07-14 DIAGNOSIS — N184 Chronic kidney disease, stage 4 (severe): Secondary | ICD-10-CM

## 2018-07-14 DIAGNOSIS — M069 Rheumatoid arthritis, unspecified: Secondary | ICD-10-CM | POA: Insufficient documentation

## 2018-07-14 DIAGNOSIS — N185 Chronic kidney disease, stage 5: Secondary | ICD-10-CM | POA: Diagnosis not present

## 2018-07-14 DIAGNOSIS — Z8249 Family history of ischemic heart disease and other diseases of the circulatory system: Secondary | ICD-10-CM | POA: Diagnosis not present

## 2018-07-14 HISTORY — DX: Presence of dental prosthetic device (complete) (partial): Z97.2

## 2018-07-14 HISTORY — DX: Morbid (severe) obesity due to excess calories: E66.01

## 2018-07-14 HISTORY — DX: Gastro-esophageal reflux disease without esophagitis: K21.9

## 2018-07-14 HISTORY — PX: AV FISTULA PLACEMENT: SHX1204

## 2018-07-14 HISTORY — DX: Presence of spectacles and contact lenses: Z97.3

## 2018-07-14 HISTORY — DX: Body Mass Index (BMI) 40.0 and over, adult: Z684

## 2018-07-14 HISTORY — DX: Anemia, unspecified: D64.9

## 2018-07-14 HISTORY — DX: Pneumonia, unspecified organism: J18.9

## 2018-07-14 HISTORY — DX: Cardiac murmur, unspecified: R01.1

## 2018-07-14 LAB — IRON AND TIBC
Iron: 49 ug/dL (ref 28–170)
Saturation Ratios: 18 % (ref 10.4–31.8)
TIBC: 279 ug/dL (ref 250–450)
UIBC: 230 ug/dL

## 2018-07-14 LAB — GLUCOSE, CAPILLARY
Glucose-Capillary: 131 mg/dL — ABNORMAL HIGH (ref 70–99)
Glucose-Capillary: 166 mg/dL — ABNORMAL HIGH (ref 70–99)

## 2018-07-14 LAB — POCT I-STAT 4, (NA,K, GLUC, HGB,HCT)
GLUCOSE: 119 mg/dL — AB (ref 70–99)
HEMATOCRIT: 31 % — AB (ref 36.0–46.0)
Hemoglobin: 10.5 g/dL — ABNORMAL LOW (ref 12.0–15.0)
Potassium: 4.3 mmol/L (ref 3.5–5.1)
Sodium: 140 mmol/L (ref 135–145)

## 2018-07-14 LAB — FERRITIN: Ferritin: 63 ng/mL (ref 11–307)

## 2018-07-14 LAB — PROTIME-INR
INR: 1.06
PROTHROMBIN TIME: 13.7 s (ref 11.4–15.2)

## 2018-07-14 SURGERY — ARTERIOVENOUS (AV) FISTULA CREATION
Anesthesia: Monitor Anesthesia Care | Laterality: Right

## 2018-07-14 MED ORDER — DEXMEDETOMIDINE HCL IN NACL 400 MCG/100ML IV SOLN: INTRAVENOUS | Status: DC | PRN

## 2018-07-14 MED ORDER — SODIUM CHLORIDE 0.9 % IV SOLN
INTRAVENOUS | Status: DC
  Administered 2018-07-14: 08:00:00 via INTRAVENOUS

## 2018-07-14 MED ORDER — ONDANSETRON HCL 4 MG/2ML IJ SOLN
INTRAMUSCULAR | Status: DC | PRN
  Administered 2018-07-14: 4 mg via INTRAVENOUS

## 2018-07-14 MED ORDER — DEXMEDETOMIDINE HCL IN NACL 200 MCG/50ML IV SOLN
INTRAVENOUS | Status: DC | PRN
  Administered 2018-07-14: 1 ug/kg/h via INTRAVENOUS

## 2018-07-14 MED ORDER — LACTATED RINGERS IV SOLN: INTRAVENOUS | Status: DC

## 2018-07-14 MED ORDER — CHLORHEXIDINE GLUCONATE 4 % EX LIQD: 60.0000 mL | Freq: Once | CUTANEOUS | Status: DC

## 2018-07-14 MED ORDER — SODIUM CHLORIDE 0.9 % IV SOLN
INTRAVENOUS | Status: DC | PRN
  Administered 2018-07-14: 08:00:00

## 2018-07-14 MED ORDER — MEPERIDINE HCL 50 MG/ML IJ SOLN: 6.2500 mg | INTRAMUSCULAR | Status: DC | PRN

## 2018-07-14 MED ORDER — PROPOFOL 10 MG/ML IV BOLUS
INTRAVENOUS | Status: AC
  Filled 2018-07-14: qty 20

## 2018-07-14 MED ORDER — FENTANYL CITRATE (PF) 100 MCG/2ML IJ SOLN
INTRAMUSCULAR | Status: DC | PRN
  Administered 2018-07-14 (×2): 25 ug via INTRAVENOUS

## 2018-07-14 MED ORDER — DARBEPOETIN ALFA 60 MCG/0.3ML IJ SOSY
60.0000 ug | PREFILLED_SYRINGE | INTRAMUSCULAR | Status: DC
  Administered 2018-07-14: 60 ug via SUBCUTANEOUS

## 2018-07-14 MED ORDER — PROPOFOL 500 MG/50ML IV EMUL
INTRAVENOUS | Status: DC | PRN
  Administered 2018-07-14: 25 ug/kg/min via INTRAVENOUS

## 2018-07-14 MED ORDER — PROTAMINE SULFATE 10 MG/ML IV SOLN
INTRAVENOUS | Status: DC | PRN
  Administered 2018-07-14: 50 mg via INTRAVENOUS

## 2018-07-14 MED ORDER — HYDROMORPHONE HCL 1 MG/ML IJ SOLN
INTRAMUSCULAR | Status: AC
  Filled 2018-07-14: qty 0.5

## 2018-07-14 MED ORDER — LIDOCAINE HCL (PF) 1 % IJ SOLN
INTRAMUSCULAR | Status: DC | PRN
  Administered 2018-07-14: 30 mL

## 2018-07-14 MED ORDER — HEPARIN SODIUM (PORCINE) 1000 UNIT/ML IJ SOLN
INTRAMUSCULAR | Status: AC
  Filled 2018-07-14: qty 1

## 2018-07-14 MED ORDER — SODIUM CHLORIDE 0.9 % IV SOLN
INTRAVENOUS | Status: AC
  Filled 2018-07-14: qty 1.2

## 2018-07-14 MED ORDER — MIDAZOLAM HCL 2 MG/2ML IJ SOLN
INTRAMUSCULAR | Status: AC
  Filled 2018-07-14: qty 2

## 2018-07-14 MED ORDER — CEFAZOLIN SODIUM-DEXTROSE 2-4 GM/100ML-% IV SOLN
2.0000 g | INTRAVENOUS | Status: AC
  Administered 2018-07-14: 2 g via INTRAVENOUS
  Filled 2018-07-14: qty 100

## 2018-07-14 MED ORDER — MIDAZOLAM HCL 5 MG/5ML IJ SOLN
INTRAMUSCULAR | Status: DC | PRN
  Administered 2018-07-14 (×2): 0.5 mg via INTRAVENOUS

## 2018-07-14 MED ORDER — TRAMADOL HCL 50 MG PO TABS: 50.0000 mg | ORAL_TABLET | Freq: Four times a day (QID) | ORAL | 0 refills | Status: DC | PRN

## 2018-07-14 MED ORDER — DEXMEDETOMIDINE HCL IN NACL 200 MCG/50ML IV SOLN
INTRAVENOUS | Status: AC
  Filled 2018-07-14: qty 50

## 2018-07-14 MED ORDER — FENTANYL CITRATE (PF) 250 MCG/5ML IJ SOLN
INTRAMUSCULAR | Status: AC
  Filled 2018-07-14: qty 5

## 2018-07-14 MED ORDER — LIDOCAINE HCL (PF) 1 % IJ SOLN
INTRAMUSCULAR | Status: AC
  Filled 2018-07-14: qty 30

## 2018-07-14 MED ORDER — METOCLOPRAMIDE HCL 5 MG/ML IJ SOLN: 10.0000 mg | Freq: Once | INTRAMUSCULAR | Status: DC | PRN

## 2018-07-14 MED ORDER — FENTANYL CITRATE (PF) 100 MCG/2ML IJ SOLN: 25.0000 ug | INTRAMUSCULAR | Status: DC | PRN

## 2018-07-14 MED ORDER — DARBEPOETIN ALFA 60 MCG/0.3ML IJ SOSY
PREFILLED_SYRINGE | INTRAMUSCULAR | Status: AC
  Administered 2018-07-14: 60 ug via SUBCUTANEOUS
  Filled 2018-07-14: qty 0.3

## 2018-07-14 MED ORDER — HEPARIN SODIUM (PORCINE) 1000 UNIT/ML IJ SOLN
INTRAMUSCULAR | Status: DC | PRN
  Administered 2018-07-14: 5000 [IU] via INTRAVENOUS

## 2018-07-14 MED ORDER — 0.9 % SODIUM CHLORIDE (POUR BTL) OPTIME
TOPICAL | Status: DC | PRN
  Administered 2018-07-14: 1000 mL

## 2018-07-14 SURGICAL SUPPLY — 35 items
ADH SKN CLS APL DERMABOND .7 (GAUZE/BANDAGES/DRESSINGS) ×1
ARMBAND PINK RESTRICT EXTREMIT (MISCELLANEOUS) ×3 IMPLANT
CANISTER SUCT 3000ML PPV (MISCELLANEOUS) ×2 IMPLANT
CANNULA VESSEL 3MM 2 BLNT TIP (CANNULA) ×2 IMPLANT
CLIP VESOCCLUDE MED 6/CT (CLIP) ×2 IMPLANT
CLIP VESOCCLUDE SM WIDE 6/CT (CLIP) ×2 IMPLANT
COVER PROBE W GEL 5X96 (DRAPES) IMPLANT
DECANTER SPIKE VIAL GLASS SM (MISCELLANEOUS) ×2 IMPLANT
DERMABOND ADVANCED (GAUZE/BANDAGES/DRESSINGS) ×1
DERMABOND ADVANCED .7 DNX12 (GAUZE/BANDAGES/DRESSINGS) ×1 IMPLANT
DRAIN PENROSE 1/4X12 LTX STRL (WOUND CARE) ×2 IMPLANT
ELECT REM PT RETURN 9FT ADLT (ELECTROSURGICAL) ×2
ELECTRODE REM PT RTRN 9FT ADLT (ELECTROSURGICAL) ×1 IMPLANT
GLOVE BIO SURGEON STRL SZ 6.5 (GLOVE) ×2 IMPLANT
GLOVE BIO SURGEON STRL SZ7.5 (GLOVE) ×2 IMPLANT
GLOVE BIOGEL PI IND STRL 6.5 (GLOVE) IMPLANT
GLOVE BIOGEL PI INDICATOR 6.5 (GLOVE) ×1
GOWN STRL REUS W/ TWL LRG LVL3 (GOWN DISPOSABLE) ×3 IMPLANT
GOWN STRL REUS W/TWL LRG LVL3 (GOWN DISPOSABLE) ×6
KIT BASIN OR (CUSTOM PROCEDURE TRAY) ×2 IMPLANT
KIT TURNOVER KIT B (KITS) ×2 IMPLANT
LOOP VESSEL MINI RED (MISCELLANEOUS) IMPLANT
NS IRRIG 1000ML POUR BTL (IV SOLUTION) ×2 IMPLANT
PACK CV ACCESS (CUSTOM PROCEDURE TRAY) ×2 IMPLANT
PAD ARMBOARD 7.5X6 YLW CONV (MISCELLANEOUS) ×4 IMPLANT
SPONGE SURGIFOAM ABS GEL 100 (HEMOSTASIS) IMPLANT
SUT PROLENE 6 0 BV (SUTURE) ×2 IMPLANT
SUT PROLENE 6 0 CC (SUTURE) ×3 IMPLANT
SUT PROLENE 7 0 BV 1 (SUTURE) ×2 IMPLANT
SUT VIC AB 3-0 SH 27 (SUTURE) ×2
SUT VIC AB 3-0 SH 27X BRD (SUTURE) ×1 IMPLANT
SUT VICRYL 4-0 PS2 18IN ABS (SUTURE) ×2 IMPLANT
TOWEL GREEN STERILE (TOWEL DISPOSABLE) ×2 IMPLANT
UNDERPAD 30X30 (UNDERPADS AND DIAPERS) ×2 IMPLANT
WATER STERILE IRR 1000ML POUR (IV SOLUTION) ×2 IMPLANT

## 2018-07-14 NOTE — Telephone Encounter (Signed)
sch appt spk to pt 08/26/18 2pm Dialysis Duplex  3pm p/o MD

## 2018-07-14 NOTE — Op Note (Signed)
Procedure: Right Brachial Cephalic AV fistula  Preop: ESRD  Postop: ESRD  Anesthesia: Local with sedation  Assistant: Leontine Locket, PA-C  Findings: 3.5 mm cephalic vein  Procedure: After obtaining informed consent, the patient was taken to the operating room.  After adequate sedation, the right upper extremity was prepped and draped in usual sterile fashion.  A transverse incision was then made near the antecubital crease the right arm. The incision was carried into the subcutaneous tissues down to level of the cephalic vein. The cephalic vein was approximately 3.5 mm in diameter. It was of good quality. This was dissected free circumferentially and small side branches ligated and divided between silk ties or clips. Next the brachial artery was dissected free in the medial portion of the incision. The artery was  3-4 mm in diameter. The vessel loops were placed proximal and distal to the planned site of arteriotomy. The patient was given 5000 units of intravenous heparin. After appropriate circulation time, the vessel loops were used to control the artery. A longitudinal opening was made in the brachial artery.  The vein was ligated distally with a 2-0 silk tie. The vein was controlled proximally with a fine bulldog clamp. The vein was then swung over to the artery and sewn end of vein to side of artery using a running 7-0 Prolene suture. Just prior to completion of the anastomosis, everything was fore bled back bled and thoroughly flushed. The anastomosis was secured, vessel loops released, and there was a palpable thrill in the fistula immediately. After hemostasis was obtained, the subcutaneous tissues were reapproximated using a running 3-0 Vicryl suture. The skin was then closed with a 4 0 Vicryl subcuticular stitch. Dermabond was applied to the skin incision.  The patient had a palpable radial pulse at the end of the case.  Ruta Hinds, MD [facility]  [place] Office: [phone] Pager: [phone]

## 2018-07-14 NOTE — Discharge Instructions (Signed)
° °  [facility]  Discharge Instructions  AV Fistula or Graft Surgery for Dialysis Access  Please refer to the following instructions for your post-procedure care. Your surgeon or physician assistant will discuss any changes with you.  Activity  You may drive the day following your surgery, if you are comfortable and no longer taking prescription pain medication. Resume full activity as the soreness in your incision resolves.  Bathing/Showering  You may shower after you go home. Keep your incision dry for 48 hours. Do not soak in a bathtub, hot tub, or swim until the incision heals completely. You may not shower if you have a hemodialysis catheter.  Incision Care  Clean your incision with mild soap and water after 48 hours. Pat the area dry with a clean towel. You do not need a bandage unless otherwise instructed. Do not apply any ointments or creams to your incision. You may have skin glue on your incision. Do not peel it off. It will come off on its own in about one week. Your arm may swell a bit after surgery. To reduce swelling use pillows to elevate your arm so it is above your heart. Your doctor will tell you if you need to lightly wrap your arm with an ACE bandage.  Diet  Resume your normal diet. There are not special food restrictions following this procedure. In order to heal from your surgery, it is CRITICAL to get adequate nutrition. Your body requires vitamins, minerals, and protein. Vegetables are the best source of vitamins and minerals. Vegetables also provide the perfect balance of protein. Processed food has little nutritional value, so try to avoid this.  Medications  Resume taking all of your medications. If your incision is causing pain, you may take over-the counter pain relievers such as acetaminophen (Tylenol). If you were prescribed a stronger pain medication, please be aware these medications can cause nausea and constipation. Prevent  nausea by taking the medication with a snack or meal. Avoid constipation by drinking plenty of fluids and eating foods with high amount of fiber, such as fruits, vegetables, and grains.  Do not take Tylenol if you are taking prescription pain medications.  Follow up Your surgeon may want to see you in the office following your access surgery. If so, this will be arranged at the time of your surgery.  Please call us immediately for any of the following conditions:  Increased pain, redness, drainage (pus) from your incision site Fever of 101 degrees or higher Severe or worsening pain at your incision site Hand pain or numbness.  Reduce your risk of vascular disease:  Stop smoking. If you would like help, call [facility] at 1-800-QUIT-NOW 6508310606) or [place] at [place] your cholesterol Maintain a desired weight Control your diabetes Keep your blood pressure down  Dialysis  It will take several weeks to several months for your new dialysis access to be ready for use. Your surgeon will determine when it is okay to use it. Your nephrologist will continue to direct your dialysis. You can continue to use your Permcath until your new access is ready for use.   07/14/2018 Melissa Bartlett [id number] 1946-06-01  Surgeon(s): Elam Dutch, MD  Procedure(s): ARTERIOVENOUS (AV) FISTULA CREATION BRACHIOCEPHALIC-right  x Do not stick fistula for 12 weeks    If you have any questions, please call the office at [phone].

## 2018-07-14 NOTE — Transfer of Care (Signed)
Immediate Anesthesia Transfer of Care Note  Patient: Melissa Bartlett  Procedure(s) Performed: ARTERIOVENOUS (AV) FISTULA CREATION BRACHIOCEPHALIC (Right )  Patient Location: PACU  Anesthesia Type:MAC  Level of Consciousness: awake, alert , oriented and sedated  Airway & Oxygen Therapy: Patient Spontanous Breathing and Patient connected to face mask oxygen  Post-op Assessment: Report given to RN, Post -op Vital signs reviewed and stable and Patient moving all extremities  Post vital signs: Reviewed and stable  Last Vitals:  Vitals Value Taken Time  BP 111/87 07/14/2018 10:20 AM  Temp 36.3 C 07/14/2018 10:20 AM  Pulse 83 07/14/2018 10:20 AM  Resp 13 07/14/2018 10:20 AM  SpO2 98 % 07/14/2018 10:20 AM    Last Pain:  Vitals:   07/14/18 0638  TempSrc: Oral  PainSc: 0-No pain      Patients Stated Pain Goal: 3 (47/34/03 7096)  Complications: No apparent anesthesia complications

## 2018-07-14 NOTE — Anesthesia Preprocedure Evaluation (Addendum)
Anesthesia Evaluation  Patient identified by MRN, date of birth, ID band Patient awake    Reviewed: Allergy & Precautions, NPO status , Patient's Chart, lab work & pertinent test results  Airway Mallampati: II  TM Distance: >3 FB Neck ROM: Full    Dental no notable dental hx. (+) Partial Lower, Partial Upper   Pulmonary sleep apnea , former smoker,    Pulmonary exam normal breath sounds clear to auscultation       Cardiovascular hypertension, Pt. on medications +CHF  Normal cardiovascular exam+ dysrhythmias Atrial Fibrillation  Rhythm:Regular Rate:Normal     Neuro/Psych negative neurological ROS  negative psych ROS   GI/Hepatic negative GI ROS, Neg liver ROS,   Endo/Other  diabetesMorbid obesity  Renal/GU CRF and ESRFRenal disease  negative genitourinary   Musculoskeletal  (+) Arthritis , Rheumatoid disorders,    Abdominal   Peds negative pediatric ROS (+)  Hematology negative hematology ROS (+)   Anesthesia Other Findings   Reproductive/Obstetrics negative OB ROS                          Anesthesia Physical Anesthesia Plan  ASA: III  Anesthesia Plan: MAC   Post-op Pain Management:    Induction:   PONV Risk Score and Plan: 2 and Ondansetron and Treatment may vary due to age or medical condition  Airway Management Planned: Simple Face Mask  Additional Equipment:   Intra-op Plan:   Post-operative Plan:   Informed Consent: I have reviewed the patients History and Physical, chart, labs and discussed the procedure including the risks, benefits and alternatives for the proposed anesthesia with the patient or authorized representative who has indicated his/her understanding and acceptance.   Dental advisory given  Plan Discussed with:   Anesthesia Plan Comments:         Anesthesia Quick Evaluation

## 2018-07-14 NOTE — Addendum Note (Signed)
Addendum  created 07/14/18 1614 by Scheryl Darter, CRNA   Intraprocedure Event edited

## 2018-07-14 NOTE — Addendum Note (Signed)
Addendum  created 07/14/18 1054 by Scheryl Darter, CRNA   Intraprocedure LDAs edited, LDA properties accepted

## 2018-07-14 NOTE — Interval H&P Note (Signed)
History and Physical Interval Note:  07/14/2018 8:27 AM  Melissa Bartlett  has presented today for surgery, with the diagnosis of chronic kidney disease stage 5  The various methods of treatment have been discussed with the patient and family. After consideration of risks, benefits and other options for treatment, the patient has consented to  Procedure(s): ARTERIOVENOUS (AV) FISTULA CREATION BRACHIOCEPHALIC (Right) as a surgical intervention .  The patient's history has been reviewed, patient examined, no change in status, stable for surgery.  I have reviewed the patient's chart and labs.  Questions were answered to the patient's satisfaction.     Ruta Hinds

## 2018-07-14 NOTE — Anesthesia Postprocedure Evaluation (Signed)
Anesthesia Post Note  Patient: Melissa Bartlett  Procedure(s) Performed: ARTERIOVENOUS (AV) FISTULA CREATION BRACHIOCEPHALIC (Right )     Patient location during evaluation: PACU Anesthesia Type: MAC Level of consciousness: awake and alert Pain management: pain level controlled Vital Signs Assessment: post-procedure vital signs reviewed and stable Respiratory status: spontaneous breathing, nonlabored ventilation, respiratory function stable and patient connected to nasal cannula oxygen Cardiovascular status: stable and blood pressure returned to baseline Postop Assessment: no apparent nausea or vomiting Anesthetic complications: no    Last Vitals:  Vitals:   07/14/18 1020 07/14/18 1035  BP: 111/87 112/75  Pulse: 83 88  Resp: 13 20  Temp: (!) 36.3 C   SpO2: 98% 96%    Last Pain:  Vitals:   07/14/18 1020  TempSrc:   PainSc: Asleep                 Montez Hageman

## 2018-07-15 ENCOUNTER — Encounter (HOSPITAL_COMMUNITY): Payer: Self-pay | Admitting: Vascular Surgery

## 2018-07-15 ENCOUNTER — Other Ambulatory Visit: Payer: Self-pay

## 2018-07-15 DIAGNOSIS — N185 Chronic kidney disease, stage 5: Secondary | ICD-10-CM

## 2018-07-27 DIAGNOSIS — H04123 Dry eye syndrome of bilateral lacrimal glands: Secondary | ICD-10-CM | POA: Diagnosis not present

## 2018-07-27 DIAGNOSIS — H4053X1 Glaucoma secondary to other eye disorders, bilateral, mild stage: Secondary | ICD-10-CM | POA: Diagnosis not present

## 2018-07-27 DIAGNOSIS — E119 Type 2 diabetes mellitus without complications: Secondary | ICD-10-CM | POA: Diagnosis not present

## 2018-07-28 ENCOUNTER — Encounter: Payer: Self-pay | Admitting: *Deleted

## 2018-07-28 ENCOUNTER — Other Ambulatory Visit: Payer: Self-pay | Admitting: *Deleted

## 2018-07-28 NOTE — Patient Outreach (Signed)
[facility]) Care Management [place] Telephone Outreach Post-hospital discharge day 72 07/28/2018  Melissa Bartlett 03-16-1946 [id number]  Successful outgoing telephone call to Melissa Bartlett, 72 y.o.femalereferred to [place] hospitalization May 17-20, 2019 for Acute respiratory Failure, weakness, fatigue, shortness of breath, secondary to CHF exacerbation with pulmonary edema; patient noted to have episode of V-tach during hospitalization.Patient has history including, but not limited to, HTN; morbid obesity; CKD-stage IV; OSA with CPAP; Atrial Fibrillation; CHF; Rheumatoid arthritis; and DM. HIPAA/ identity verified with patientduring phone call today.  Today, patient reports that she "is doing pretty good," and she denies pain, new/ recent falls, and she sounds to be in no distress throughout entirety of phone call today.  Reports that she tolerated recent AV Fistula placement on 07/14/18 "better than" she "thought she would."  States new AV Fistula "occasionally throbs," but she denies pain/ concerns around AVF site.  Continues to report that this was placed "just in case" she "ever needs to be put on HD."  States that she received and reviewed printed EMMI educaitonal material that I had placed in the mail to her pre-procedure, and denies further questions/ concerns around this recent procedure.  Patient further reports:  -- Has all medicationsand takes as prescribed;denies recent changes/ questions/ concernsaroundcurrent medications; reports that she continues using weekly pill planner box provided to her during [place] home visit, stating that this "has really helped" her manage her medications.  -- provider appointments: Patient reports attended eye doctor "yesterday;" stating "got a good report;" no other recent provider appointments, but several upcoming provider appointments: 08/24/18: cardiology  08/25/18: renal 08/26/18:  vascular surgeon follow up post- recent AVF placement  Husband continues providing transportation to provider appointments.   Self-health management ofchronic disease state of CHF: --continuesmonitoring/recordingdaily weights;reviewed recently recorded weights at home with patient and she reports "all have been really stable, with hardly any day to day changes;" reports all weight ranges at home over last 2 weeks: "all staying right at 248 each and every day." -- denies overnight gains > 3 lbs, states has not had to use "extra" dose of lasix per individualized action plan with cardiologist -- continues to deny issues/ concerns around breathing status/ LE edema, reporting both at her "normal" baseline; again states she "is in green zone" today --continues able to verbalize signs/ symptoms yellow CHF/ AF zone with minimal prompting-- signs/ symptoms yellow zone along with weight gain guidelines in setting of CHF all re-iterated/ re-reviewed with patient today-- patient stated that she has been feeling "more tired" over last week, and is unsure if this is related to heart condition or just the "heat outside."  I encouraged her to contact her care providers of this if her concerns continue -- continues to deny signs/ symptoms A-Fib  Patient denies further issues, concerns, or problems today, and we discussed that she has thus far met her previously established [facility] CM goals; we discussed possibility of upcoming [place] case closure, with transfer to [place]. Patient stated that she agrees with case closure after next call, and requests that I contact her again after her next scheduled provider appointments in late August.  Iconfirmed that patient hasmy direct phone number, the main [facility] CM office phone number, and the [facility] CM 24-hour nurse advice phone number should issues arise prior to next scheduled [place] scheduled phone call next month,  post-scheduled provider office visits. Encouraged patient to contact  me directly or to contact [facility] CM main office,if needs, questions, issues, or concerns arise prior to next scheduled outreach; patient agreed to do so.  Plan:  Patient will take medications as prescribed and will attend all schedule provider appointments post-hospital discharge  Patient will promptly notify care providers for any new concerns/ issues/ problems that arise  Patient will continue to monitor and record daily weightsand CHF/ AF zones along with corresponding action plans  [facility] CM outreach to continue with scheduledphone call nextmonth, post-scheduled provider office visits; possibly for [place] CM case closure with transfer to [place] Problem Two     Most Recent Value  Care Plan Problem Two  Knowledge deficit regarding self-health management of chronic disease states of CHF and A-Fib, as evidenced by patient reporting of same   Role Documenting the Problem Two  Care Management Coordinator  Care Plan for Problem Two  Active  Interventions for Problem Two Long Term Goal   Confirmed that patient is able to verbalize signs/ symptoms yellow zone for CHF with corresponding action plan,  discussed patient's recent symptoms of fatigue with her, and encouraged her to contact care providers if she continues feeling fatigued,  reviewed weight monitoring at home with patient and confirmed that patient is able to verbalize weight gain guidelines in setting of CHF  [facility] Long Term Goal  Over the next 31 days, patient will be able to verbalize signs/ symptoms of yellow zones for CHF and AF, along with corresponding action plans, as evidenced by patient reporting of same during [place] RN CM outreach  [facility] Long Term Goal Start Date  07/28/18- Goal extended/ modified today  [facility] CM Short Term Goal #1   Over the next 30 days, patient will attend all scheduled provider office  visits and will contact care providers for any concerns/ issues/ problems that arise prior to upcoming scheduled office visits  [facility] CM Short Term Goal #1 Start Date  07/28/18  Interventions for Short Term Goal #2   reviewed recent and upcoming scheduled provider office visits and confirmed that patient's husband will continue to provide transportation to all,  encouraged patient to promptly notify care providers for any new concerns, issues problems that arise prior to upcoming scheduled office visits     Oneta Rack, RN, BSN, [employer] Coordinator [facility] Care Management  [phone]

## 2018-08-11 ENCOUNTER — Encounter (HOSPITAL_COMMUNITY)
Admission: RE | Admit: 2018-08-11 | Discharge: 2018-08-11 | Disposition: A | Discharge status: Home / Self Care | Payer: Medicare Other | Source: Ambulatory Visit | Attending: Nephrology | Admitting: Nephrology

## 2018-08-11 VITALS — BP 143/104 | HR 91 | Temp 97.6°F

## 2018-08-11 DIAGNOSIS — D631 Anemia in chronic kidney disease: Secondary | ICD-10-CM | POA: Diagnosis not present

## 2018-08-11 DIAGNOSIS — N184 Chronic kidney disease, stage 4 (severe): Secondary | ICD-10-CM | POA: Insufficient documentation

## 2018-08-11 LAB — CBC WITH DIFFERENTIAL/PLATELET
Abs Immature Granulocytes: 0.1 10*3/uL (ref 0.0–0.1)
BASOS ABS: 0 10*3/uL (ref 0.0–0.1)
BASOS PCT: 0 %
EOS PCT: 1 %
Eosinophils Absolute: 0.1 10*3/uL (ref 0.0–0.7)
HEMATOCRIT: 34.4 % — AB (ref 36.0–46.0)
Hemoglobin: 9.7 g/dL — ABNORMAL LOW (ref 12.0–15.0)
IMMATURE GRANULOCYTES: 1 %
Lymphocytes Relative: 12 %
Lymphs Abs: 1.6 10*3/uL (ref 0.7–4.0)
MCH: 26.5 pg (ref 26.0–34.0)
MCHC: 28.2 g/dL — AB (ref 30.0–36.0)
MCV: 94 fL (ref 78.0–100.0)
Monocytes Absolute: 1.1 10*3/uL — ABNORMAL HIGH (ref 0.1–1.0)
Monocytes Relative: 9 %
NEUTROS PCT: 77 %
Neutro Abs: 9.6 10*3/uL — ABNORMAL HIGH (ref 1.7–7.7)
Platelets: 235 10*3/uL (ref 150–400)
RBC: 3.66 MIL/uL — AB (ref 3.87–5.11)
RDW: 17.2 % — ABNORMAL HIGH (ref 11.5–15.5)
WBC: 12.5 10*3/uL — AB (ref 4.0–10.5)

## 2018-08-11 MED ORDER — DARBEPOETIN ALFA 60 MCG/0.3ML IJ SOSY
60.0000 ug | PREFILLED_SYRINGE | INTRAMUSCULAR | Status: DC
  Administered 2018-08-11: 60 ug via SUBCUTANEOUS

## 2018-08-12 LAB — PTH, INTACT AND CALCIUM
Calcium, Total (PTH): 8.6 mg/dL — ABNORMAL LOW (ref 8.7–10.3)
PTH: 165 pg/mL — ABNORMAL HIGH (ref 15–65)

## 2018-08-16 DIAGNOSIS — M150 Primary generalized (osteo)arthritis: Secondary | ICD-10-CM | POA: Diagnosis not present

## 2018-08-16 DIAGNOSIS — Z79899 Other long term (current) drug therapy: Secondary | ICD-10-CM | POA: Diagnosis not present

## 2018-08-16 DIAGNOSIS — Z6841 Body Mass Index (BMI) 40.0 and over, adult: Secondary | ICD-10-CM | POA: Diagnosis not present

## 2018-08-16 DIAGNOSIS — M2550 Pain in unspecified joint: Secondary | ICD-10-CM | POA: Diagnosis not present

## 2018-08-16 DIAGNOSIS — E790 Hyperuricemia without signs of inflammatory arthritis and tophaceous disease: Secondary | ICD-10-CM | POA: Diagnosis not present

## 2018-08-16 DIAGNOSIS — M0579 Rheumatoid arthritis with rheumatoid factor of multiple sites without organ or systems involvement: Secondary | ICD-10-CM | POA: Diagnosis not present

## 2018-08-24 ENCOUNTER — Ambulatory Visit: Payer: Medicare Other | Admitting: Cardiology

## 2018-08-25 DIAGNOSIS — N2581 Secondary hyperparathyroidism of renal origin: Secondary | ICD-10-CM | POA: Diagnosis not present

## 2018-08-25 DIAGNOSIS — M069 Rheumatoid arthritis, unspecified: Secondary | ICD-10-CM | POA: Diagnosis not present

## 2018-08-25 DIAGNOSIS — E1122 Type 2 diabetes mellitus with diabetic chronic kidney disease: Secondary | ICD-10-CM | POA: Diagnosis not present

## 2018-08-25 DIAGNOSIS — E785 Hyperlipidemia, unspecified: Secondary | ICD-10-CM | POA: Diagnosis not present

## 2018-08-25 DIAGNOSIS — I129 Hypertensive chronic kidney disease with stage 1 through stage 4 chronic kidney disease, or unspecified chronic kidney disease: Secondary | ICD-10-CM | POA: Diagnosis not present

## 2018-08-25 DIAGNOSIS — E669 Obesity, unspecified: Secondary | ICD-10-CM | POA: Diagnosis not present

## 2018-08-25 DIAGNOSIS — I480 Paroxysmal atrial fibrillation: Secondary | ICD-10-CM | POA: Diagnosis not present

## 2018-08-25 DIAGNOSIS — N189 Chronic kidney disease, unspecified: Secondary | ICD-10-CM | POA: Diagnosis not present

## 2018-08-25 DIAGNOSIS — D631 Anemia in chronic kidney disease: Secondary | ICD-10-CM | POA: Diagnosis not present

## 2018-08-25 DIAGNOSIS — N184 Chronic kidney disease, stage 4 (severe): Secondary | ICD-10-CM | POA: Diagnosis not present

## 2018-08-25 DIAGNOSIS — I5033 Acute on chronic diastolic (congestive) heart failure: Secondary | ICD-10-CM | POA: Diagnosis not present

## 2018-08-26 ENCOUNTER — Ambulatory Visit (HOSPITAL_COMMUNITY)
Admission: RE | Admit: 2018-08-26 | Discharge: 2018-08-26 | Disposition: A | Discharge status: Home / Self Care | Payer: Medicare Other | Source: Ambulatory Visit | Attending: Vascular Surgery | Admitting: Vascular Surgery

## 2018-08-26 ENCOUNTER — Ambulatory Visit (INDEPENDENT_AMBULATORY_CARE_PROVIDER_SITE_OTHER): Payer: Self-pay | Admitting: Vascular Surgery

## 2018-08-26 ENCOUNTER — Encounter: Payer: Self-pay | Admitting: Vascular Surgery

## 2018-08-26 ENCOUNTER — Other Ambulatory Visit: Payer: Self-pay

## 2018-08-26 VITALS — BP 146/87 | HR 103 | Temp 98.0°F | Resp 20 | Ht 62.5 in | Wt 248.0 lb

## 2018-08-26 DIAGNOSIS — N184 Chronic kidney disease, stage 4 (severe): Secondary | ICD-10-CM

## 2018-08-26 DIAGNOSIS — I770 Arteriovenous fistula, acquired: Secondary | ICD-10-CM | POA: Diagnosis not present

## 2018-08-26 DIAGNOSIS — N185 Chronic kidney disease, stage 5: Secondary | ICD-10-CM | POA: Diagnosis not present

## 2018-08-26 NOTE — Progress Notes (Signed)
Patient is a 72 year old female who is status post creation of a right brachiocephalic AV fistula about 6 weeks ago.  She is not currently on hemodialysis.  Her primary nephrologist is Dr. Arty Baumgartner.  She denies any numbness or tingling in her hand.  She has no incisional drainage.  Physical exam:  Vitals:   08/26/18 1505  BP: (!) 146/87  Pulse: (!) 103  Resp: 20  Temp: 98 F (36.7 C)  TempSrc: Oral  SpO2: 96%  Weight: 248 lb (112.5 kg)  Height: 5' 2.5" (1.588 m)    Right upper extremity well-healed antecubital incision easily palpable thrill the fistula is not easily palpable in the upper arm secondary to obesity  Data: Patient had a duplex ultrasound of her AV fistula today.  It is 6 mm in diameter.  However most of the fistula is 10 mm in depth from the skin.  Assessment: Maturing AV fistula right arm.  Plan: The patient is currently not on hemodialysis.  If dialysis is going to be fairly eminent we would need to do a Superficialization procedure as the fistula is fairly deep.  I will leave this at the discretion of Dr. Arty Baumgartner.  If he wishes for [place] to go ahead and superficial lysed the fistula now we can get this scheduled in the near future.  Otherwise she will follow-up with [place] on an as-needed basis.  Ruta Hinds, MD [facility] [place] Office: [phone] Pager: [phone]

## 2018-08-27 ENCOUNTER — Other Ambulatory Visit: Payer: Self-pay | Admitting: *Deleted

## 2018-08-27 ENCOUNTER — Encounter: Payer: Self-pay | Admitting: *Deleted

## 2018-08-27 NOTE — Patient Outreach (Signed)
[facility]) Care Management [facility] CM Case Closure- with transfer to [place] referral Post-hospital discharge day 103 08/27/2018  Melissa Bartlett [birth year] [id number]  Successful outgoing telephone call toPhyllis L Bartlett,72 y.o.femalereferred to [place] afterrecent hospitalization May 17-20, 2019 for Acute respiratory Failure, weakness, fatigue, shortness of breath, secondary to CHF exacerbation with pulmonary edema; patient noted to have episode of V-tach during hospitalization.Patient has history including, but not limited to, HTN; morbid obesity; CKD-stage IV; OSA with CPAP; Atrial Fibrillation; CHF; Rheumatoid arthritis; and DM. HIPAA/ identity verified with patientduring phone call today.  Today, patient reports that she "has been doing pretty good," and she denies pain, new/ recent falls.  Patient sounds to be in no distress throughout entirety of phone call today.  Denies issues/ concerns around AV Fistula, which was surgically placed 07/14/18 for possible future need of CKD; states "it seems to have all healed up."  Patient further reports:  -- Has all medicationsand takes as prescribed;denies questions/ concernsaroundcurrent medications; reports that she continues independently managing medications, using weekly pill planner box.  Reports that during renal provider appointment this week, her diuretic (Lasix) dose was "increased for 3 days," as she had experienced "about 10 lbs weight gain since last visit 3 months ago."  Reports now taking lasix 100 mg po BID x 3 days; started this regimen "on Wednesday night 08/25/18," post-renal provider appointment.  Reports tolerating increased dose "okay," stating that she 'will do what (she) has to" to stay "healthy and out of trouble."  Reports that renal provider "may" put her on permanent dose of lasix 80 po BID, (current maintenance dose is 60 mg po BID- since last hospital admisison in  May 2019).  Denies further changes to medications, and stated that she will follow up with renal provider "again in 3 months" to evaluate need to increase maintenance lasix dosing.  -- provider appointments: Patient reports attended all recent provider appointments: 08/24/18: cardiology -- Dr. Marlou Porch; visit cancelled due to office provider illness- now (re)-scheduled for Tuesday August 31, 2018 08/25/18: renal provider: Dr. Corliss Blacker; attended visit with provider NP:  Reports continues to be closely followed in effort to prevent/ avoid future need for HD 08/26/18: vascular surgeon (Dr. Oneida Alar) follow up post- recent AVF placement; reports AV fistula site "has healed completely;" stated that vascular surgeon shared with her that her fistula "is very deep" and that should it become necessary for her to have HD in the future, that the fistula would need to have surgical revision to make it more superficial.  Reports that vascular surgeon has communicated this need with her renal provider.  Husband continues providing transportation to provider appointments, and all upcoming provider visits were reviewed with patient today.  Self-health management ofchronic disease state of CHF: --continueswell-established practice of monitoring/recordingdaily weights;reviewed recently recorded weights at home with patient and she reports ongoing stable weights, with recent ranges consistently between 242 - 244 lbs with weight today of "244 lbs;" discussed with patient that this represents an improvement over her last reported weights during [place] outreach (246-249 lbs); and patient again confirmed that she "has not ever" experienced weight gain of 3 lbs overnight/ 5 lbs in one week.  However, she again expresses concerns with weight gain over longer periods of time: as reported during last outreach, patient had noticed a 10-pound weight gain over the course of 4 weeks, without significant weight gain  overnight/ in one isolated week, and without concurrent symptoms  of being in "yellow" CHF zone.  We discussed patient's weight gain at recent renal provider office visit: she reports that provider informed her that she "was up 10 lbs" from last office visit 3 months ago, which is why her lasix was temporarily adjusted.  We discussed that the recent adjustment in her diuretic dosing is most likely reflective of her recent weight loss, and patient again expresses frustration around "knowing when to be concerned with weight gain" over time, when she does not experience other signs/ symptoms yellow CHF zone, or overnight/ weekly weight gain.  Patient denies episodes shortness of breath, and states that her biggest "ongoing" symptom revolves around "just being tired."   Describes example of when she attends church where her husband is a Theme park manager, that by the time she walks into the sanctuary from the parking lot, she has to sit down to rest because she is "so tired;" denies feeling winded with these episodes-- "just really tired."  Patient also acknowledges ongoing and intermittent episodes of LE swelling, "maily (R) leg" which is her baseline.  -- continues having "extra" dose of lasix as needed for weight gain overnight/ in one week, per individualized action plan with cardiologist: prior to recent adjustment in lasix at renal provider office visit, reports has "not needed," as she has not had overnight or weekly weight gain; patient's weight gain has all been gradual, over periods of 3-4 weeks.  Patient states, "I just am never sure if my weight gain is related to my CHF or my kidney's-- and I feel confused about when I should be concerned."   -- states she "is in green zone" today  --continuesable to verbalize signs/ symptoms yellow CHF/ AF zones and corresponding action plans with minimal prompting-- signs/ symptoms yellow zone along with weight gain guidelines in setting of CHF all re-iteratedd with patient  today  --continues todenysigns/ symptoms A-Fib  -- continues monitoring and recording daily BP's and SaO2 levels at home; denies recent concerns around either of these values  -- positive reinforcement provided for patient consistently continuing daily self-health management strategies for CHF/ AF  Patient denies further issues, concerns, or problems today, and we discussed that she has thus far met her previously established [place] CM goals; patient denies further care coordination needs and I again shared with patient value of transferring her ongoing care over to [place], and patient provides agreement and verbal consent to make this referral today.   Iconfirmed that patient hasmy direct phone number, the main [facility] CM office phone number, and the [facility] CM 24-hour nurse advice phone number should issues arise prior to outreach by [place]; I assured patient that [place] would leave her a voice message if she is not available at time of initial outreach-- I encouraged patient to actively engage with [place], and she agreed to do so.  Plan:  Will close [place] CM case, as patient has successfully met her previously established goals and denies further care coordination needs  Will place [place] referral, and make patient's PCP aware of transfer to [place] CM Care Plan Problem One     Most Recent Value  Care Plan Problem One  Need for ongoing reinforcement of self-health management strategies for CHF in setting of concurrent CKD, as evidenced by patient reporting  Role Documenting the Problem One  Care Management [place] for Problem One  Active  [facility] Long Term Goal   Over the next 31 days, patient will communicate and engage with [place] for ongoing reinforcement of self-health management strategies for CHF in concurrent setting of CKD, as evidenced by patient successfully  engaing with [facility] RN [place] Term Goal Start Date  08/27/18  Interventions for Problem One Long Term Goal  Discussed patient's ongoing concerns with self-health management of CHF in concurrent setting of CKD,  discussed current plan of care to prevent patient from having to go on hemodialysis,  discussed overall pathophysiology of fluid management in setting of both CHF and CKD.  Confirmed that patient does not have further care coordination needs and introduced role of [facility] RN [place] for ongoing reinforcement of patient's well-established daily routines for CHF self-health management and placed referral to [place] Problem Two     Most Recent Value  Care Plan Problem Two  Knowledge deficit regarding self-health management of chronic disease states of CHF and A-Fib, as evidenced by patient reporting of same   Role Documenting the Problem Two  Care Management Coordinator  Care Plan for Problem Two  Not Active  Interventions for Problem Two Long Term Goal   Confirmed with patient that she is able to accurately verbalize signs/ symptoms CHF/ AF yellow and red zones along with corresponding action plans,  discussed patient's ongoing motivation for reinforcement of daily self-health management strategies for CHF in concurrent setting of CKD,  confirmed that patient does not have further care coordination needs,  discussed role of [place], and obtained patient's verbal consent to engage with [place] Long Term Goal  Over the next 31 days, patient will be able to verbalize signs/ symptoms of yellow zones for CHF and AF, along with corresponding action plans, as evidenced by patient reporting of same during [place] CM outreach  [facility] Long Term Goal Start Date  07/28/18  [facility] Long Term Goal Met Date  08/27/18 [Goal met]  [facility] CM Short Term Goal #1   Over the next 30 days, patient will attend all scheduled provider office  visits and will contact care providers for any concerns/ issues/ problems that arise prior to upcoming scheduled office visits  [facility] CM Short Term Goal #1 Start Date  07/28/18  [facility] CM Short Term Goal #1 Met Date   08/27/18 [Goal met]  Interventions for Short Term Goal #2   Confirmed that patient attended all recent provider appointments,  reviewed each visit with patient as well as upcoming scheduled appointments and confirmed that patient's husband will continue providing transportation to provider appointments     I appreciate the opportunity to have participated in [place]' care,  Oneta Rack, RN, BSN, [employer] Coordinator [facility] Care Management  [phone]

## 2018-08-30 ENCOUNTER — Encounter: Payer: Self-pay | Admitting: *Deleted

## 2018-08-31 ENCOUNTER — Encounter: Payer: Self-pay | Admitting: Cardiology

## 2018-08-31 ENCOUNTER — Ambulatory Visit (INDEPENDENT_AMBULATORY_CARE_PROVIDER_SITE_OTHER): Payer: Medicare Other | Admitting: Cardiology

## 2018-08-31 VITALS — BP 140/76 | HR 115 | Ht 62.5 in | Wt 246.1 lb

## 2018-08-31 DIAGNOSIS — I4891 Unspecified atrial fibrillation: Secondary | ICD-10-CM

## 2018-08-31 DIAGNOSIS — I5032 Chronic diastolic (congestive) heart failure: Secondary | ICD-10-CM | POA: Diagnosis not present

## 2018-08-31 DIAGNOSIS — I10 Essential (primary) hypertension: Secondary | ICD-10-CM

## 2018-08-31 MED ORDER — DILTIAZEM HCL 30 MG PO TABS: 30.0000 mg | ORAL_TABLET | Freq: Four times a day (QID) | ORAL | 0 refills | Status: DC

## 2018-08-31 NOTE — Patient Instructions (Addendum)
Medication Instructions:  Your physician has recommended you make the following change in your medication:  1. START DILTIAZEM 30 MG FOUR TIMES A DAY.  2. STOP AMLODIPINE   Labwork: NONE TODAY  Testing/Procedures: None ordered  Follow-Up: Your physician wants you to follow-up WITH BRITTANY SIMMONS, PA-C ON 9/17 @ 11:30 AM.  Any Other Special Instructions Will Be Listed Below (If Applicable).     If you need a refill on your cardiac medications before your next appointment, please call your pharmacy.  '

## 2018-08-31 NOTE — Progress Notes (Signed)
08/31/2018 Melissa Bartlett   October 27, 1946  [id number]  Primary Physician Donald Prose, MD Primary Cardiologist: Dr. Marlou Porch  Reason for Visit/CC: f/u for atrial fibrillation and chronic diastolic HF  HPI:  Melissa Bartlett is a 72 y.o. female who is being seen today for routine f/u.  Medical history is significant for normal cardiac catheterization, for atypical chest pain and slightly abnormal nuclear stress test, November 16, 2017.  She also has a history of permanent atrial fibrillation, on rate controlled therapy w/ BB, h/o failed cardioversion's, chronic anticoagulation therapy with Xarelto (reduced dose due to creatinine clearance), stage IV chronic kidney disease w/ recent vascular surgery for AV fistula access, chronic diastolic heart failure, obesity and obstructive sleep apnea, fully compliant w/ CPAP.   She is here today in clinic with her husband. She reports exertional fatigue and occasional exertional dyspnea. No chest pain and no palpitations. She reports full med compliance. She is on the max dose of Coreg, 25 mg BID. She has been checking her weight daily at home, which has remained stable in the 244-247 lb range. Her last OV weight 05/19/18 was 242 lb. Weight today is 246 lb. She is on Lasix 60 mg BID and followed by nephrology. She has trace LEE on exam today. Lungs are CTAB. Her most recent 2D echo 01/28/18 showed normal LVEF at 55%. LA and RA were both mildly dilated. There was moderate MR.   Resting EKG today shows atrial fibrillation with controlled ventricular response at 89 bpm however we had the patient ambulate a short distance around the clinic and her heart rate jumped into the 120s to 140s and return back to the 90s upon resting.  Reports full compliance with Xarelto.  She had recent laboratory work done August 11, 2018 including CBC which showed stable chronic anemia with normal Iron/ TIBC and Ferritin levels. Suspect anemia of chronic disease from CKD.   Current Meds    Medication Sig  . brimonidine-timolol (COMBIGAN) 0.2-0.5 % ophthalmic solution Place 1 drop into both eyes 2 (two) times daily.  . Calcium Carbonate-Vitamin D3 (CALCIUM 600/VITAMIN D) 600-400 MG-UNIT TABS Take 1 tablet by mouth 2 (two) times daily.  . carvedilol (COREG) 25 MG tablet TAKE 1 TABLET BY MOUTH TWICE A DAY WITH A MEAL  . Darbepoetin Alfa (ARANESP) 60 MCG/0.3ML SOSY injection Inject 60 mcg every 30 (thirty) days into the skin.  Marland Kitchen diphenhydramine-acetaminophen (TYLENOL PM) 25-500 MG TABS tablet Take 1 tablet by mouth at bedtime.   . furosemide (LASIX) 20 MG tablet Take 3 tablets (60 mg total) by mouth 2 (two) times daily. (Patient taking differently: Take 100 mg by mouth 2 (two) times daily. )  . leflunomide (ARAVA) 10 MG tablet Take 10 mg by mouth daily.   . Magnesium Oxide 400 (240 Mg) MG TABS Take 400 mg by mouth 2 (two) times daily.  . pantoprazole (PROTONIX) 40 MG tablet Take 1 tablet (40 mg total) by mouth daily.  . potassium chloride SA (KLOR-CON M20) 20 MEQ tablet Take 1 tablet (20 mEq total) by mouth daily.  . predniSONE (DELTASONE) 5 MG tablet Take 5 mg by mouth daily.   Marland Kitchen PRESCRIPTION MEDICATION Inhale into the lungs at bedtime. CPAP  . Probiotic Product (ALIGN PO) Take 1 tablet by mouth at bedtime.   . Rivaroxaban (XARELTO) 15 MG TABS tablet Take 1 tablet (15 mg total) daily with supper by mouth.  . simvastatin (ZOCOR) 20 MG tablet Take 20 mg by mouth at bedtime.   Marland Kitchen  vitamin B-12 (CYANOCOBALAMIN) 1000 MCG tablet Take 1,000 mcg by mouth daily.   Allergies  Allergen Reactions  . Enalapril Other (See Comments)    Systemic reaction - jerking  . Codeine Itching   Past Medical History:  Diagnosis Date  . Anemia   . CHF (congestive heart failure) ([place])   . CKD (chronic kidney disease), stage IV ([place])   . Complication of anesthesia    " I aspirated during my gallbladder surgery"  . Diabetes ([place])   . GERD (gastroesophageal reflux disease)   . Heart murmur   . HTN  (hypertension)   . Morbid obesity with BMI of 45.0-49.9, adult ([place])   . OSA (obstructive sleep apnea)    with AHI 11/hr with nocturnal hypoxemia   . PAF (paroxysmal atrial fibrillation) ([place])    s/p DCCV in 8/17  . Pneumonia   . RA (rheumatoid arthritis) ([place])   . Wears glasses   . Wears partial dentures    Family History  Problem Relation Age of Onset  . Hypertension Mother        Family HX Diabetes  . Stomach cancer Father    Past Surgical History:  Procedure Laterality Date  . ABDOMINAL HYSTERECTOMY    . ANKLE FUSION Right   . AV FISTULA PLACEMENT Right 07/14/2018   Procedure: ARTERIOVENOUS (AV) FISTULA CREATION BRACHIOCEPHALIC;  Surgeon: Elam Dutch, MD;  Location: [facility];  Service: Vascular;  Laterality: Right;  . BREAST BIOPSY    . CARDIOVERSION N/A 08/15/2016   Procedure: CARDIOVERSION;  Surgeon: Jerline Pain, MD;  [facility] ENDOSCOPY;  Service: Cardiovascular;  Laterality: N/A;  . CHOLECYSTECTOMY    . COLONOSCOPY W/ BIOPSIES AND POLYPECTOMY    . DILATION AND CURETTAGE OF UTERUS    . GALLBLADDER SURGERY    . HYSTEROTOMY     Patrtial  . HYSTEROTOMY     Complete  . KNEE ARTHROSCOPY    . LEFT HEART CATH AND CORONARY ANGIOGRAPHY N/A 11/16/2017   Procedure: LEFT HEART CATH AND CORONARY ANGIOGRAPHY;  Surgeon: Martinique, Peter M, MD;  Location: [facility] CV LAB;  Service: Cardiovascular;  Laterality: N/A;  . MULTIPLE TOOTH EXTRACTIONS    . TUBAL LIGATION     Social History   Socioeconomic History  . Marital status: Married    Spouse name: Not on file  . Number of children: Not on file  . Years of education: Not on file  . Highest education level: Not on file  Occupational History  . Not on file  Social Needs  . Financial resource strain: Not on file  . Food insecurity:    Worry: Not on file    Inability: Not on file  . Transportation needs:    Medical: Not on file    Non-medical: Not on file  Tobacco Use  . Smoking status: Former Smoker    Types:  Cigarettes    Last attempt to quit: 04/08/2016    Years since quitting: 2.3  . Smokeless tobacco: Never Used  Substance and Sexual Activity  . Alcohol use: No    Alcohol/week: 0.0 standard drinks  . Drug use: No  . Sexual activity: Not on file  Lifestyle  . Physical activity:    Days per week: Not on file    Minutes per session: Not on file  . Stress: Not on file  Relationships  . Social connections:    Talks on phone: Not on file    Gets together: Not on file  Attends religious service: Not on file    Active member of club or organization: Not on file    Attends meetings of clubs or organizations: Not on file    Relationship status: Not on file  . Intimate partner violence:    Fear of current or ex partner: Not on file    Emotionally abused: Not on file    Physically abused: Not on file    Forced sexual activity: Not on file  Other Topics Concern  . Not on file  Social History Narrative  . Not on file     Review of Systems: General: negative for chills, fever, night sweats or weight changes.  Cardiovascular: negative for chest pain, dyspnea on exertion, edema, orthopnea, palpitations, paroxysmal nocturnal dyspnea or shortness of breath Dermatological: negative for rash Respiratory: negative for cough or wheezing Urologic: negative for hematuria Abdominal: negative for nausea, vomiting, diarrhea, bright red blood per rectum, melena, or hematemesis Neurologic: negative for visual changes, syncope, or dizziness All other systems reviewed and are otherwise negative except as noted above.   Physical Exam:  Blood pressure 140/76, pulse (!) 115, height 5' 2.5" (1.588 m), weight 246 lb 1.9 oz (111.6 kg), SpO2 (!) 89 %.  General appearance: alert, cooperative, no distress and moderately obese Neck: no carotid bruit and no JVD Lungs: clear to auscultation bilaterally Heart: irregularly irregular rhythm and tachy rate Extremities: obese, trace bilateral pitting edema Pulses:  2+ and symmetric Skin: Skin color, texture, turgor normal. No rashes or lesions Neurologic: Grossly normal  EKG resting EKG atrial fibrillation 89 bpm,  -- personally reviewed   ASSESSMENT AND PLAN:   1. Permanent Atrial Fibrillation: symptomatic with exertion, most notably exertional dyspnea. Resting EKG today shows atrial fibrillation with controlled ventricular response at 89 bpm however we had the patient ambulate a short distance around the clinic and her heart rate jumped into the 120s to 140s and return back to the 90s upon resting. She was visibly symptomatic and appeared short of breath. No CP. She reports full compliance with Xarelto. Recent CBC showed stable chronic anemia w/ Hgb at 9.7 c/w baseline (suspected anemia of chronic disease from CKD). Her symptoms and HR improved after rest. She is on the max dose of Coreg, 25 mg BID. We will add a second agent for added rate control. Her most recent echo showed normal LVEF. We will stop amlodipine and start Cardizem. We will start with short acting 30 mg Q6H to see how she tolerates. F/u in 2 weeks. If tolerating ok will transition to [place]. If we are unable to improve rate and control symptoms with rate control strategy, we will refer to the Afib clinic to discuss possible AAD therapy vs ablation. Her recent echo showed mildly dilated Lt and Rt atria. She is to continue Xarelto for a/c. She has been fully compliant w/ CPAP. We discussed avoidance of caffeine.   2. Chronic Diastolic HF: volume appears stable. Continue Lasix 60 mg BID. She has been compliant with daily weights. She has a Higher education careers adviser monitoring weight. She will notify us if any significant change in weight.   3. HTN: mildly elevated at 140/76. We are stopping amlodipine and adding Cardizem 30 mg Q6H to help with afib. Continue Coreg and Lasix. F/u in 2 weeks to reassess BP response to CCB change.    4. CKD: followed by nephrology. Recently underwent vascular surgery for HD access.  Has AV fistula.   5. OSA: reports full nightly compliance with CPAP.  Follow-Up f/u in 2 weeks to reassess HR response to medication change.   Brittainy Ladoris Gene, MHS [facility] 08/31/2018 3:43 PM

## 2018-09-02 ENCOUNTER — Encounter: Payer: Medicare Other | Admitting: Vascular Surgery

## 2018-09-02 ENCOUNTER — Encounter (HOSPITAL_COMMUNITY): Payer: Medicare Other

## 2018-09-08 ENCOUNTER — Ambulatory Visit (HOSPITAL_COMMUNITY)
Admission: RE | Admit: 2018-09-08 | Discharge: 2018-09-08 | Disposition: A | Discharge status: Home / Self Care | Payer: Medicare Other | Source: Ambulatory Visit | Attending: Nephrology | Admitting: Nephrology

## 2018-09-08 VITALS — BP 114/61 | HR 70 | Temp 97.7°F | Resp 20

## 2018-09-08 DIAGNOSIS — N184 Chronic kidney disease, stage 4 (severe): Secondary | ICD-10-CM | POA: Diagnosis not present

## 2018-09-08 DIAGNOSIS — I129 Hypertensive chronic kidney disease with stage 1 through stage 4 chronic kidney disease, or unspecified chronic kidney disease: Secondary | ICD-10-CM | POA: Diagnosis not present

## 2018-09-08 DIAGNOSIS — D631 Anemia in chronic kidney disease: Secondary | ICD-10-CM | POA: Insufficient documentation

## 2018-09-08 LAB — CBC WITH DIFFERENTIAL/PLATELET
ABS IMMATURE GRANULOCYTES: 0.1 10*3/uL (ref 0.0–0.1)
BASOS ABS: 0 10*3/uL (ref 0.0–0.1)
Basophils Relative: 0 %
Eosinophils Absolute: 0.1 10*3/uL (ref 0.0–0.7)
Eosinophils Relative: 1 %
HCT: 34.8 % — ABNORMAL LOW (ref 36.0–46.0)
HEMOGLOBIN: 9.7 g/dL — AB (ref 12.0–15.0)
IMMATURE GRANULOCYTES: 1 %
LYMPHS PCT: 9 %
Lymphs Abs: 1.1 10*3/uL (ref 0.7–4.0)
MCH: 26.6 pg (ref 26.0–34.0)
MCHC: 27.9 g/dL — ABNORMAL LOW (ref 30.0–36.0)
MCV: 95.3 fL (ref 78.0–100.0)
MONO ABS: 0.9 10*3/uL (ref 0.1–1.0)
Monocytes Relative: 7 %
NEUTROS ABS: 10 10*3/uL — AB (ref 1.7–7.7)
NEUTROS PCT: 82 %
PLATELETS: 217 10*3/uL (ref 150–400)
RBC: 3.65 MIL/uL — AB (ref 3.87–5.11)
RDW: 17.2 % — ABNORMAL HIGH (ref 11.5–15.5)
WBC: 12.2 10*3/uL — AB (ref 4.0–10.5)

## 2018-09-08 LAB — IRON AND TIBC
Iron: 31 ug/dL (ref 28–170)
Saturation Ratios: 9 % — ABNORMAL LOW (ref 10.4–31.8)
TIBC: 342 ug/dL (ref 250–450)
UIBC: 311 ug/dL

## 2018-09-08 LAB — FERRITIN: Ferritin: 34 ng/mL (ref 11–307)

## 2018-09-08 MED ORDER — DARBEPOETIN ALFA 60 MCG/0.3ML IJ SOSY
60.0000 ug | PREFILLED_SYRINGE | INTRAMUSCULAR | Status: DC
  Administered 2018-09-08: 60 ug via SUBCUTANEOUS

## 2018-09-08 MED ORDER — DARBEPOETIN ALFA 60 MCG/0.3ML IJ SOSY
PREFILLED_SYRINGE | INTRAMUSCULAR | Status: AC
  Filled 2018-09-08: qty 0.3

## 2018-09-14 ENCOUNTER — Ambulatory Visit (INDEPENDENT_AMBULATORY_CARE_PROVIDER_SITE_OTHER): Payer: Medicare Other | Admitting: Cardiology

## 2018-09-14 ENCOUNTER — Telehealth: Payer: Self-pay | Admitting: Cardiology

## 2018-09-14 ENCOUNTER — Encounter: Payer: Self-pay | Admitting: Cardiology

## 2018-09-14 VITALS — BP 130/78 | HR 80 | Ht 62.5 in | Wt 244.8 lb

## 2018-09-14 DIAGNOSIS — I480 Paroxysmal atrial fibrillation: Secondary | ICD-10-CM

## 2018-09-14 DIAGNOSIS — Z79899 Other long term (current) drug therapy: Secondary | ICD-10-CM

## 2018-09-14 MED ORDER — DILTIAZEM HCL ER COATED BEADS 120 MG PO CP24: 120.0000 mg | ORAL_CAPSULE | Freq: Every day | ORAL | 3 refills | Status: DC

## 2018-09-14 MED ORDER — ATORVASTATIN CALCIUM 20 MG PO TABS: 20.0000 mg | ORAL_TABLET | Freq: Every day | ORAL | 3 refills | Status: DC

## 2018-09-14 NOTE — Telephone Encounter (Signed)
Left message for pt to return my call re: potential drug interactions.

## 2018-09-14 NOTE — Telephone Encounter (Signed)
Received notification from pt pharmacy regarding potential drug interaction between Cardizem and simvastatin. I discussed with Dr. Marlou Porch. He has recommended we stop simvastatin. Add Lipitor 20 mg. Check FLP and HFTs in 8 weeks. Please notify pt of recs and send new statin Rx to pharmacy.

## 2018-09-14 NOTE — Patient Instructions (Addendum)
Medication Instructions:  Your physician has recommended you make the following change in your medication:  1.  STOP the Cardizem 30 mg  2.  START the Cardizem 120 mg daily   Labwork: None ordered  Testing/Procedures: None ordered  Follow-Up: Your physician recommends that you schedule a follow-up appointment in: 11/15/18 WITH DR. Marlou Porch.    Any Other Special Instructions Will Be Listed Below (If Applicable).     If you need a refill on your cardiac medications before your next appointment, please call your pharmacy.

## 2018-09-14 NOTE — Progress Notes (Signed)
09/14/2018 Melissa Bartlett   02-05-1946  [id number]  Primary Physician Donald Prose, MD Primary Cardiologist: Dr. Marlou Porch  Reason for Visit/CC: F/u for Atrial Fibrillation   HPI:  Melissa Bartlett is a 72 y.o. female who is being seen today for f/u for atrial fibrillation and medication management.   Her past medical history is significant for normal cardiac catheterization, for atypical chest pain and slightly abnormal nuclear stress test, November 16, 2017.  She also has a history of permanent atrial fibrillation, on rate controlled therapy w/ BB, h/o failed cardioversion's, chronic anticoagulation therapy with Xarelto (reduced dose due to creatinine clearance), stage IV chronic kidney disease w/ recent vascular surgery for AV fistula access, chronic diastolic heart failure, obesity and obstructive sleep apnea, fully compliant w/ CPAP.   She was seen 08/31/18 for routine f/u and had noted exertional fatigue and occasional exertional dyspnea. No chest pain and no palpitations. She reported full med compliance. She is on the max dose of Coreg, 25 mg BID. She had been checking her weight daily at home, which had remained stable in the 244-247 lb range. Her last OV weight 05/19/18 was 242 lb. Weight on 9/3 was 246 lb. She is on Lasix 60 mg BID and followed by nephrology. She had trace LEE on exam. Lungs were CTAB. Her most recent 2D echo 01/28/18 showed normal LVEF at 55%. LA and RA were both mildly dilated. There was moderate MR.   At last OV, Resting EKG showed atrial fibrillation with controlled ventricular response at 89 bpm however we had her ambulate a short distance around the clinic and her heart rate jumped into the 120s to 140s and return back to the 90s upon resting.  She had reported full compliance with Xarelto.  She had recent laboratory work done August 11, 2018 including CBC which showed stable chronic anemia with normal Iron/ TIBC and Ferritin levels. Suspect anemia of chronic disease  from CKD.   Given her afib w/ RVR with ambulation, I stopped her amlodipine and added Cardizem 30 mg q6H. She presents back today to reassess symptoms and to see if we can transition to once daily Cardizem.   She reports improvement in symptoms. Better exercise tolerance. She is tolerating Cardizem ok w/o side effects. EKG shows afib 80 bpms. BP is stable at 130/78.   I had pt ambulate the halls and max HR w/ ambulation only 102 bpm. She was visibly noticed to have better exercise tolerance today and no signs of exertional dyspnea.   Current Meds  Medication Sig  . brimonidine-timolol (COMBIGAN) 0.2-0.5 % ophthalmic solution Place 1 drop into both eyes 2 (two) times daily.  . Calcium Carbonate-Vitamin D3 (CALCIUM 600/VITAMIN D) 600-400 MG-UNIT TABS Take 1 tablet by mouth 2 (two) times daily.  . carvedilol (COREG) 25 MG tablet TAKE 1 TABLET BY MOUTH TWICE A DAY WITH A MEAL  . Darbepoetin Alfa (ARANESP) 60 MCG/0.3ML SOSY injection Inject 60 mcg every 30 (thirty) days into the skin.  Marland Kitchen diphenhydramine-acetaminophen (TYLENOL PM) 25-500 MG TABS tablet Take 1 tablet by mouth at bedtime.   . furosemide (LASIX) 20 MG tablet Take 80 mg by mouth 2 (two) times daily.   . hydrALAZINE (APRESOLINE) 50 MG tablet Take 50 mg by mouth 4 (four) times daily.  Marland Kitchen leflunomide (ARAVA) 10 MG tablet Take 10 mg by mouth daily.   . Magnesium Oxide 400 (240 Mg) MG TABS Take 400 mg by mouth 2 (two) times daily.  . pantoprazole (PROTONIX) 40  MG tablet Take 1 tablet (40 mg total) by mouth daily.  . potassium chloride SA (KLOR-CON M20) 20 MEQ tablet Take 1 tablet (20 mEq total) by mouth daily.  . predniSONE (DELTASONE) 5 MG tablet Take 5 mg by mouth daily.   Marland Kitchen PRESCRIPTION MEDICATION Inhale into the lungs at bedtime. CPAP  . Probiotic Product (ALIGN PO) Take 1 tablet by mouth at bedtime.   . Rivaroxaban (XARELTO) 15 MG TABS tablet Take 1 tablet (15 mg total) daily with supper by mouth.  . simvastatin (ZOCOR) 20 MG tablet  Take 20 mg by mouth at bedtime.   . vitamin B-12 (CYANOCOBALAMIN) 1000 MCG tablet Take 1,000 mcg by mouth daily.  . [DISCONTINUED] diltiazem (CARDIZEM) 30 MG tablet Take 1 tablet (30 mg total) by mouth 4 (four) times daily.   Allergies  Allergen Reactions  . Enalapril Other (See Comments)    Systemic reaction - jerking  . Codeine Itching   Past Medical History:  Diagnosis Date  . Anemia   . CHF (congestive heart failure) ([place])   . CKD (chronic kidney disease), stage IV ([place])   . Complication of anesthesia    " I aspirated during my gallbladder surgery"  . Diabetes ([place])   . GERD (gastroesophageal reflux disease)   . Heart murmur   . HTN (hypertension)   . Morbid obesity with BMI of 45.0-49.9, adult ([place])   . OSA (obstructive sleep apnea)    with AHI 11/hr with nocturnal hypoxemia   . PAF (paroxysmal atrial fibrillation) ([place])    s/p DCCV in 8/17  . Pneumonia   . RA (rheumatoid arthritis) ([place])   . Wears glasses   . Wears partial dentures    Family History  Problem Relation Age of Onset  . Hypertension Mother        Family HX Diabetes  . Stomach cancer Father    Past Surgical History:  Procedure Laterality Date  . ABDOMINAL HYSTERECTOMY    . ANKLE FUSION Right   . AV FISTULA PLACEMENT Right 07/14/2018   Procedure: ARTERIOVENOUS (AV) FISTULA CREATION BRACHIOCEPHALIC;  Surgeon: Elam Dutch, MD;  Location: [facility];  Service: Vascular;  Laterality: Right;  . BREAST BIOPSY    . CARDIOVERSION N/A 08/15/2016   Procedure: CARDIOVERSION;  Surgeon: Jerline Pain, MD;  Location[facility] ENDOSCOPY;  Service: Cardiovascular;  Laterality: N/A;  . CHOLECYSTECTOMY    . COLONOSCOPY W/ BIOPSIES AND POLYPECTOMY    . DILATION AND CURETTAGE OF UTERUS    . GALLBLADDER SURGERY    . HYSTEROTOMY     Patrtial  . HYSTEROTOMY     Complete  . KNEE ARTHROSCOPY    . LEFT HEART CATH AND CORONARY ANGIOGRAPHY N/A 11/16/2017   Procedure: LEFT HEART CATH AND CORONARY ANGIOGRAPHY;  Surgeon: Martinique,  Peter M, MD;  Location: [facility];  Service: Cardiovascular;  Laterality: N/A;  . MULTIPLE TOOTH EXTRACTIONS    . TUBAL LIGATION     Social History   Socioeconomic History  . Marital status: Married    Spouse name: Not on file  . Number of children: Not on file  . Years of education: Not on file  . Highest education level: Not on file  Occupational History  . Not on file  Social Needs  . Financial resource strain: Not on file  . Food insecurity:    Worry: Not on file    Inability: Not on file  . Transportation needs:    Medical: Not on file  Non-medical: Not on file  Tobacco Use  . Smoking status: Former Smoker    Types: Cigarettes    Last attempt to quit: 04/08/2016    Years since quitting: 2.4  . Smokeless tobacco: Never Used  Substance and Sexual Activity  . Alcohol use: No    Alcohol/week: 0.0 standard drinks  . Drug use: No  . Sexual activity: Not on file  Lifestyle  . Physical activity:    Days per week: Not on file    Minutes per session: Not on file  . Stress: Not on file  Relationships  . Social connections:    Talks on phone: Not on file    Gets together: Not on file    Attends religious service: Not on file    Active member of club or organization: Not on file    Attends meetings of clubs or organizations: Not on file    Relationship status: Not on file  . Intimate partner violence:    Fear of current or ex partner: Not on file    Emotionally abused: Not on file    Physically abused: Not on file    Forced sexual activity: Not on file  Other Topics Concern  . Not on file  Social History Narrative  . Not on file     Review of Systems: General: negative for chills, fever, night sweats or weight changes.  Cardiovascular: negative for chest pain, dyspnea on exertion, edema, orthopnea, palpitations, paroxysmal nocturnal dyspnea or shortness of breath Dermatological: negative for rash Respiratory: negative for cough or wheezing Urologic:  negative for hematuria Abdominal: negative for nausea, vomiting, diarrhea, bright red blood per rectum, melena, or hematemesis Neurologic: negative for visual changes, syncope, or dizziness All other systems reviewed and are otherwise negative except as noted above.   Physical Exam:  Blood pressure 130/78, pulse 80, height 5' 2.5" (1.588 m), weight 244 lb 12.8 oz (111 kg), SpO2 (!) 89 %.  General appearance: alert, cooperative, no distress and morbidly obese Neck: no carotid bruit and no JVD Lungs: clear to auscultation bilaterally Heart: irregularly irregular rhythm and regular rate Extremities: extremities normal, atraumatic, no cyanosis or edema Pulses: 2+ and symmetric Skin: Skin color, texture, turgor normal. No rashes or lesions Neurologic: Grossly normal  EKG Atrial fibrillation 80 bpm -- personally reviewed   ASSESSMENT AND PLAN:   1. Chronic Atrial Fibrillation: rate improved with addition of Cardizem. Good response. Improved exercise tolerance. Max HR with ambulation today was 102 bpm, compared to 140 bpm prior to addition of Cardizem. She is tolerating ok w/o side effects. BP stable. We will transition from short to long acting Cardizem 120 mg once daily. Continue Coreg 25 mg BID. Continue Xarelto for a/c.   2. Chronic Diastolic HF: volume appears stable. Continue Lasix 60 mg BID. She has been compliant with daily weights. She has a Higher education careers adviser monitoring weight. She will notify us if any significant change in weight. Nephrology helps with diuretic dosing.   3. HTN: Controlled on current regimen. Continue Coreg, Cardizem and Lasix.   4. CKD: followed by nephrology. Recently underwent vascular surgery for HD access. Has AV fistula.   5. OSA: waiting new CPAP machine.   6. Anemia of Chronic Disease: 2/2 CKD. On monthly Aranesp. Followed by nephrology. Recent CBC 09/08/18 showed stable Hgb at 9.7, c/w baseline    Follow-Up w/ Dr. Marlou Porch in 3 months.   Latecia Miler Ladoris Gene,  [facility] 09/14/2018 12:00 PM

## 2018-09-22 ENCOUNTER — Other Ambulatory Visit: Payer: Self-pay | Admitting: Cardiology

## 2018-09-23 ENCOUNTER — Emergency Department (HOSPITAL_COMMUNITY)
Admission: EM | Admit: 2018-09-23 | Discharge: 2018-09-24 | Disposition: A | Discharge status: Home / Self Care | Payer: Medicare Other | Attending: Emergency Medicine | Admitting: Emergency Medicine

## 2018-09-23 ENCOUNTER — Other Ambulatory Visit: Payer: Self-pay | Admitting: *Deleted

## 2018-09-23 ENCOUNTER — Other Ambulatory Visit: Payer: Self-pay

## 2018-09-23 ENCOUNTER — Encounter (HOSPITAL_COMMUNITY): Payer: Self-pay | Admitting: Emergency Medicine

## 2018-09-23 ENCOUNTER — Emergency Department (HOSPITAL_COMMUNITY): Payer: Medicare Other

## 2018-09-23 DIAGNOSIS — I509 Heart failure, unspecified: Secondary | ICD-10-CM

## 2018-09-23 DIAGNOSIS — I130 Hypertensive heart and chronic kidney disease with heart failure and stage 1 through stage 4 chronic kidney disease, or unspecified chronic kidney disease: Secondary | ICD-10-CM | POA: Insufficient documentation

## 2018-09-23 DIAGNOSIS — J90 Pleural effusion, not elsewhere classified: Secondary | ICD-10-CM | POA: Diagnosis not present

## 2018-09-23 DIAGNOSIS — Z87891 Personal history of nicotine dependence: Secondary | ICD-10-CM | POA: Diagnosis not present

## 2018-09-23 DIAGNOSIS — R0902 Hypoxemia: Secondary | ICD-10-CM | POA: Diagnosis not present

## 2018-09-23 DIAGNOSIS — J929 Pleural plaque without asbestos: Secondary | ICD-10-CM | POA: Diagnosis not present

## 2018-09-23 DIAGNOSIS — R0602 Shortness of breath: Secondary | ICD-10-CM | POA: Diagnosis not present

## 2018-09-23 DIAGNOSIS — Z79899 Other long term (current) drug therapy: Secondary | ICD-10-CM | POA: Insufficient documentation

## 2018-09-23 DIAGNOSIS — N184 Chronic kidney disease, stage 4 (severe): Secondary | ICD-10-CM | POA: Insufficient documentation

## 2018-09-23 DIAGNOSIS — I5032 Chronic diastolic (congestive) heart failure: Secondary | ICD-10-CM | POA: Diagnosis not present

## 2018-09-23 DIAGNOSIS — E119 Type 2 diabetes mellitus without complications: Secondary | ICD-10-CM | POA: Insufficient documentation

## 2018-09-23 DIAGNOSIS — I110 Hypertensive heart disease with heart failure: Secondary | ICD-10-CM | POA: Diagnosis not present

## 2018-09-23 LAB — BASIC METABOLIC PANEL
ANION GAP: 12 (ref 5–15)
BUN: 46 mg/dL — ABNORMAL HIGH (ref 8–23)
CHLORIDE: 104 mmol/L (ref 98–111)
CO2: 25 mmol/L (ref 22–32)
Calcium: 8.9 mg/dL (ref 8.9–10.3)
Creatinine, Ser: 3.47 mg/dL — ABNORMAL HIGH (ref 0.44–1.00)
GFR calc Af Amer: 14 mL/min — ABNORMAL LOW (ref >60)
GFR calc non Af Amer: 12 mL/min — ABNORMAL LOW (ref >60)
GLUCOSE: 176 mg/dL — AB (ref 70–99)
POTASSIUM: 4.7 mmol/L (ref 3.5–5.1)
Sodium: 141 mmol/L (ref 135–145)

## 2018-09-23 LAB — CBC
HEMATOCRIT: 35.8 % — AB (ref 36.0–46.0)
HEMOGLOBIN: 9.9 g/dL — AB (ref 12.0–15.0)
MCH: 26 pg (ref 26.0–34.0)
MCHC: 27.7 g/dL — ABNORMAL LOW (ref 30.0–36.0)
MCV: 94 fL (ref 78.0–100.0)
Platelets: 229 10*3/uL (ref 150–400)
RBC: 3.81 MIL/uL — ABNORMAL LOW (ref 3.87–5.11)
RDW: 17 % — ABNORMAL HIGH (ref 11.5–15.5)
WBC: 11.9 10*3/uL — ABNORMAL HIGH (ref 4.0–10.5)

## 2018-09-23 LAB — I-STAT TROPONIN, ED: Troponin i, poc: 0.01 ng/mL (ref 0.00–0.08)

## 2018-09-23 LAB — BRAIN NATRIURETIC PEPTIDE: B NATRIURETIC PEPTIDE 5: 834.1 pg/mL — AB (ref 0.0–100.0)

## 2018-09-23 NOTE — Patient Outreach (Signed)
[facility]) Care Management  09/23/2018  ERMELINDA ECKERT [birth year] [id number]   RN Health Coach telephone call to patient.  [place] compliance verified. Per patient she is not feeling very well Patient stated that she has been nauseated  since Sunday . She vomited Sun,  Monday and Tues. She didn't vomit Wednesday but still is very nauseated and hadn't eaten yet. . RN asked if patient had started on any new medications. Per patient she is to take Cardizem 120 mg daily and Lipitor 20 mg. Per patient she had just gotten filled and hadn't started taking it . She was going to start taking today. Per patient she has weighed today was 240.6.  Patient stated that her joints seem to be acting up from the Rheumatoid arthritis. RN asked patient had she checked her blood pressure and patient stated that she hadn't but she had checked her O2 sat. Per patient it was in the 70's yesterday and now it was 88. RN told patient she needs to all her Dr immediately and make them aware of what is happening. RN made sure there is family member with patient. The daughter is home and the husband can come if needed. Patient stated that at the hospital they didn't want he saturation below 90-91. RN made patient aware that she need to call 911 to  go to the ER is it is ever in the  70's.  Plan; Patient will call Dr office immediately to make aware of symptoms and to come in. RN will follow up with patient  [facility] [phone]

## 2018-09-23 NOTE — ED Triage Notes (Signed)
Pt presents to ER with feeling of extreme fatigue after light activity, noticed her oxygen levels were low (denies sob or breathing difficulty), also some vomiting; pt reports hx of CHF, denies recent weight gain; pt with new fistula to R arm but not doing dialysis yet

## 2018-09-24 ENCOUNTER — Encounter (HOSPITAL_COMMUNITY): Payer: Self-pay

## 2018-09-24 ENCOUNTER — Emergency Department (HOSPITAL_COMMUNITY)
Admission: EM | Admit: 2018-09-24 | Discharge: 2018-09-24 | Disposition: A | Discharge status: Home / Self Care | Payer: Medicare Other | Source: Home / Self Care | Attending: Emergency Medicine | Admitting: Emergency Medicine

## 2018-09-24 ENCOUNTER — Emergency Department (HOSPITAL_COMMUNITY): Payer: Medicare Other

## 2018-09-24 ENCOUNTER — Other Ambulatory Visit: Payer: Self-pay

## 2018-09-24 DIAGNOSIS — I5031 Acute diastolic (congestive) heart failure: Secondary | ICD-10-CM

## 2018-09-24 DIAGNOSIS — Z79899 Other long term (current) drug therapy: Secondary | ICD-10-CM

## 2018-09-24 DIAGNOSIS — I4891 Unspecified atrial fibrillation: Secondary | ICD-10-CM | POA: Diagnosis not present

## 2018-09-24 DIAGNOSIS — J929 Pleural plaque without asbestos: Secondary | ICD-10-CM | POA: Diagnosis not present

## 2018-09-24 DIAGNOSIS — G4733 Obstructive sleep apnea (adult) (pediatric): Secondary | ICD-10-CM | POA: Diagnosis not present

## 2018-09-24 DIAGNOSIS — I130 Hypertensive heart and chronic kidney disease with heart failure and stage 1 through stage 4 chronic kidney disease, or unspecified chronic kidney disease: Secondary | ICD-10-CM

## 2018-09-24 DIAGNOSIS — Z87891 Personal history of nicotine dependence: Secondary | ICD-10-CM

## 2018-09-24 DIAGNOSIS — I959 Hypotension, unspecified: Secondary | ICD-10-CM | POA: Diagnosis not present

## 2018-09-24 DIAGNOSIS — I5030 Unspecified diastolic (congestive) heart failure: Secondary | ICD-10-CM | POA: Diagnosis not present

## 2018-09-24 DIAGNOSIS — R000 Tachycardia, unspecified: Secondary | ICD-10-CM | POA: Diagnosis not present

## 2018-09-24 DIAGNOSIS — R0602 Shortness of breath: Secondary | ICD-10-CM | POA: Insufficient documentation

## 2018-09-24 DIAGNOSIS — N184 Chronic kidney disease, stage 4 (severe): Secondary | ICD-10-CM | POA: Insufficient documentation

## 2018-09-24 DIAGNOSIS — R42 Dizziness and giddiness: Secondary | ICD-10-CM | POA: Diagnosis not present

## 2018-09-24 DIAGNOSIS — R0902 Hypoxemia: Secondary | ICD-10-CM

## 2018-09-24 DIAGNOSIS — N186 End stage renal disease: Secondary | ICD-10-CM | POA: Diagnosis not present

## 2018-09-24 DIAGNOSIS — R55 Syncope and collapse: Secondary | ICD-10-CM | POA: Diagnosis not present

## 2018-09-24 DIAGNOSIS — E119 Type 2 diabetes mellitus without complications: Secondary | ICD-10-CM

## 2018-09-24 LAB — CBC
HCT: 35.5 % — ABNORMAL LOW (ref 36.0–46.0)
HEMOGLOBIN: 9.6 g/dL — AB (ref 12.0–15.0)
MCH: 25.8 pg — AB (ref 26.0–34.0)
MCHC: 27 g/dL — ABNORMAL LOW (ref 30.0–36.0)
MCV: 95.4 fL (ref 78.0–100.0)
PLATELETS: 201 10*3/uL (ref 150–400)
RBC: 3.72 MIL/uL — ABNORMAL LOW (ref 3.87–5.11)
RDW: 17.1 % — ABNORMAL HIGH (ref 11.5–15.5)
WBC: 12.1 10*3/uL — AB (ref 4.0–10.5)

## 2018-09-24 LAB — BASIC METABOLIC PANEL
ANION GAP: 12 (ref 5–15)
BUN: 49 mg/dL — AB (ref 8–23)
CHLORIDE: 104 mmol/L (ref 98–111)
CO2: 27 mmol/L (ref 22–32)
Calcium: 8.6 mg/dL — ABNORMAL LOW (ref 8.9–10.3)
Creatinine, Ser: 3.65 mg/dL — ABNORMAL HIGH (ref 0.44–1.00)
GFR calc Af Amer: 13 mL/min — ABNORMAL LOW (ref >60)
GFR, EST NON AFRICAN AMERICAN: 12 mL/min — AB (ref >60)
Glucose, Bld: 150 mg/dL — ABNORMAL HIGH (ref 70–99)
POTASSIUM: 4.6 mmol/L (ref 3.5–5.1)
SODIUM: 143 mmol/L (ref 135–145)

## 2018-09-24 LAB — I-STAT TROPONIN, ED: TROPONIN I, POC: 0.02 ng/mL (ref 0.00–0.08)

## 2018-09-24 LAB — BRAIN NATRIURETIC PEPTIDE: B NATRIURETIC PEPTIDE 5: 708.2 pg/mL — AB (ref 0.0–100.0)

## 2018-09-24 MED ORDER — HYDROMORPHONE HCL 1 MG/ML IJ SOLN: 0.5000 mg | Freq: Once | INTRAMUSCULAR | Status: DC

## 2018-09-24 NOTE — ED Provider Notes (Signed)
[facility] EMERGENCY DEPARTMENT Provider Note   CSN: [id number] Arrival date & time: 09/24/18  1402     History   Chief Complaint Chief Complaint  Patient presents with  . Shortness of Breath    HPI Melissa Bartlett is a 72 y.o. female.  HPI   72 year old female PMH significant for CKD stage IV, A. fib, RA, acute diastolic heart failure, HTN who presents with chief complaint of shortness of breath.  Patient states that for the last 6 days she has had progressively worsening dyspnea on exertion.  Patient endorses acute onset nausea 6 days prior to arrival while at rest with intermittent emesis that was nonbloody.  Patient with intermittent episodes of nonbloody diarrhea throughout the week, currently resolved.  Denies recent fever, cough congestion runny nose.  Denies weight gain, endorses improvement of bilateral lower extremity edema.  Of note patient was seen here last night for same chief complaint and was discharged after elevated BNP with instructions to continue taking home dose Lasix.  Since discharge patient was seen by PCP at which point she was found to have oxygen requirement upon ambulation currently on 3 L nasal cannula O2.  Patient presents directly from the office.  Patient denies chest pain through entirety of symptoms.  Past Medical History:  Diagnosis Date  . Anemia   . CHF (congestive heart failure) ([place])   . CKD (chronic kidney disease), stage IV ([place])   . Complication of anesthesia    " I aspirated during my gallbladder surgery"  . Diabetes ([place])   . GERD (gastroesophageal reflux disease)   . Heart murmur   . HTN (hypertension)   . Morbid obesity with BMI of 45.0-49.9, adult ([place])   . OSA (obstructive sleep apnea)    with AHI 11/hr with nocturnal hypoxemia   . PAF (paroxysmal atrial fibrillation) ([place])    s/p DCCV in 8/17  . Pneumonia   . RA (rheumatoid arthritis) ([place])   . Wears glasses   . Wears partial dentures     Patient Active  Problem List   Diagnosis Date Noted  . AKI (acute kidney injury) ([place]) 02/10/2018  . Acute respiratory failure with hypoxia ([place]) 01/27/2018  . Permanent atrial fibrillation ([place]) 01/27/2018  . RA (rheumatoid arthritis) ([place]) 01/27/2018  . Acute diastolic CHF (congestive heart failure) ([place]) 01/27/2018  . Chest pain, rule out acute myocardial infarction 10/28/2017  . Syncope 03/14/2017  . Chest pain 03/14/2017  . OSA (obstructive sleep apnea)   . Paroxysmal atrial fibrillation ([place]) 07/31/2016  . CKD (chronic kidney disease) stage 4, GFR 15-29 ml/min ([facility]) 07/31/2016  . Essential hypertension 07/09/2015  . Morbid obesity ([place]) 07/09/2015  . Diabetes ([place]) 03/07/2014    Past Surgical History:  Procedure Laterality Date  . ABDOMINAL HYSTERECTOMY    . ANKLE FUSION Right   . AV FISTULA PLACEMENT Right 07/14/2018   Procedure: ARTERIOVENOUS (AV) FISTULA CREATION BRACHIOCEPHALIC;  Surgeon: Elam Dutch, MD;  Location: [facility];  Service: Vascular;  Laterality: Right;  . BREAST BIOPSY    . CARDIOVERSION N/A 08/15/2016   Procedure: CARDIOVERSION;  Surgeon: Jerline Pain, MD;  Location: [facility] ENDOSCOPY;  Service: Cardiovascular;  Laterality: N/A;  . CHOLECYSTECTOMY    . COLONOSCOPY W/ BIOPSIES AND POLYPECTOMY    . DILATION AND CURETTAGE OF UTERUS    . GALLBLADDER SURGERY    . HYSTEROTOMY     Patrtial  . HYSTEROTOMY     Complete  . KNEE ARTHROSCOPY    .  LEFT HEART CATH AND CORONARY ANGIOGRAPHY N/A 11/16/2017   Procedure: LEFT HEART CATH AND CORONARY ANGIOGRAPHY;  Surgeon: Martinique, Peter M, MD;  Location: [facility];  Service: Cardiovascular;  Laterality: N/A;  . MULTIPLE TOOTH EXTRACTIONS    . TUBAL LIGATION       OB History   None      Home Medications    Prior to Admission medications   Medication Sig Start Date End Date Taking? Authorizing Provider  atorvastatin (LIPITOR) 20 MG tablet Take 1 tablet (20 mg total) by mouth daily. 09/14/18 12/13/18  Lyda Jester M,  PA-C  brimonidine-timolol (COMBIGAN) 0.2-0.5 % ophthalmic solution Place 1 drop into both eyes 2 (two) times daily.    [provider]  Calcium Carbonate-Vitamin D3 (CALCIUM 600/VITAMIN D) 600-400 MG-UNIT TABS Take 1 tablet by mouth 2 (two) times daily.    [provider]  carvedilol (COREG) 25 MG tablet TAKE 1 TABLET BY MOUTH TWICE A DAY WITH A MEAL 06/21/18   Jerline Pain, MD  Darbepoetin Alfa (ARANESP) 60 MCG/0.3ML SOSY injection Inject 60 mcg every 30 (thirty) days into the skin.    [provider]  diltiazem (CARDIZEM CD) 120 MG 24 hr capsule Take 1 capsule (120 mg total) by mouth daily. 09/14/18 12/13/18  Lyda Jester M, PA-C  diphenhydramine-acetaminophen (TYLENOL PM) 25-500 MG TABS tablet Take 1 tablet by mouth at bedtime.     [provider]  furosemide (LASIX) 20 MG tablet Take 80 mg by mouth 2 (two) times daily.     [provider]  hydrALAZINE (APRESOLINE) 50 MG tablet Take 50 mg by mouth 4 (four) times daily.    [provider]  leflunomide (ARAVA) 10 MG tablet Take 10 mg by mouth daily.     [provider]  Magnesium Oxide 400 (240 Mg) MG TABS Take 400 mg by mouth 2 (two) times daily. 05/27/18   [provider]  pantoprazole (PROTONIX) 40 MG tablet Take 1 tablet (40 mg total) by mouth daily. 10/29/17   Geradine Girt, DO  potassium chloride SA (KLOR-CON M20) 20 MEQ tablet Take 1 tablet (20 mEq total) by mouth daily. 02/12/18   Jerline Pain, MD  predniSONE (DELTASONE) 5 MG tablet Take 5 mg by mouth daily.  08/22/17   [provider]  PRESCRIPTION MEDICATION Inhale into the lungs at bedtime. CPAP    [provider]  Probiotic Product (ALIGN PO) Take 1 tablet by mouth at bedtime.     [provider]  Rivaroxaban (XARELTO) 15 MG TABS tablet Take 1 tablet (15 mg total) daily with supper by mouth. 11/17/17   Martinique, Peter M, MD  vitamin B-12 (CYANOCOBALAMIN) 1000 MCG tablet Take 1,000 mcg  by mouth daily.    [provider]    Family History Family History  Problem Relation Age of Onset  . Hypertension Mother        Family HX Diabetes  . Stomach cancer Father     Social History Social History   Tobacco Use  . Smoking status: Former Smoker    Types: Cigarettes    Last attempt to quit: 04/08/2016    Years since quitting: 2.4  . Smokeless tobacco: Never Used  Substance Use Topics  . Alcohol use: No    Alcohol/week: 0.0 standard drinks  . Drug use: No     Allergies   Enalapril and Codeine   Review of Systems Review of Systems  Constitutional: Negative for chills and  fever.  HENT: Negative for ear pain and sore throat.   Eyes: Negative for pain and visual disturbance.  Respiratory: Positive for shortness of breath. Negative for cough, chest tightness and wheezing.   Cardiovascular: Negative for chest pain, palpitations and leg swelling.  Gastrointestinal: Positive for diarrhea and nausea. Negative for abdominal pain and vomiting.  Genitourinary: Negative for dysuria and hematuria.  Musculoskeletal: Negative for arthralgias and back pain.  Skin: Negative for color change and rash.  Neurological: Negative for seizures and syncope.  All other systems reviewed and are negative.    Physical Exam Updated Vital Signs BP (!) 139/106   Pulse 72   Resp (!) 77   LMP  (LMP Unknown)   SpO2 98%   Physical Exam  Constitutional: She appears well-developed and well-nourished. No distress.  HENT:  Head: Normocephalic and atraumatic.  Eyes: Conjunctivae are normal.  Neck: Neck supple.  Cardiovascular: Normal rate and intact distal pulses. An irregularly irregular rhythm present.  No murmur heard. Pulmonary/Chest: Effort normal and breath sounds normal. No accessory muscle usage. No tachypnea. No respiratory distress. She has no wheezes. She has no rhonchi. She has no rales.  Abdominal: Soft. There is no tenderness.  Musculoskeletal:       Right lower  leg: She exhibits edema.       Left lower leg: She exhibits edema.  Neurological: She is alert.  PERRLA, CN II through XII intact, 5/5 motor bilateral upper and lower extremities, normal sensation throughout.  Skin: Skin is warm and dry. Capillary refill takes less than 2 seconds.  Psychiatric: She has a normal mood and affect.  Nursing note and vitals reviewed.    ED Treatments / Results  Labs (all labs ordered are listed, but only abnormal results are displayed) Labs Reviewed  CBC - Abnormal; Notable for the following components:      Result Value   WBC 12.1 (*)    RBC 3.72 (*)    Hemoglobin 9.6 (*)    HCT 35.5 (*)    MCH 25.8 (*)    MCHC 27.0 (*)    RDW 17.1 (*)    All other components within normal limits  BASIC METABOLIC PANEL - Abnormal; Notable for the following components:   Glucose, Bld 150 (*)    BUN 49 (*)    Creatinine, Ser 3.65 (*)    Calcium 8.6 (*)    GFR calc non Af Amer 12 (*)    GFR calc Af Amer 13 (*)    All other components within normal limits  BRAIN NATRIURETIC PEPTIDE - Abnormal; Notable for the following components:   B Natriuretic Peptide 708.2 (*)    All other components within normal limits  I-STAT TROPONIN, ED    EKG EKG Interpretation  Date/Time:  Friday September 24 2018 14:23:36 EDT Ventricular Rate:  84 PR Interval:    QRS Duration: 80 QT Interval:  378 QTC Calculation: 455 R Axis:   127 Text Interpretation:  Atrial fibrillation Left posterior fascicular block Low voltage, precordial leads Anteroseptal infarct, old No acute changes No significant change since last tracing Confirmed by Varney Biles [phone]) on 09/24/2018 3:52:47 PM   Radiology Dg Chest 2 View  Result Date: 09/24/2018 CLINICAL DATA:  72 year old female with a history of weakness EXAM: CHEST - 2 VIEW COMPARISON:  09/23/2018 FINDINGS: Cardiomediastinal silhouette unchanged with cardiomegaly. No evidence of central vascular congestion. No pneumothorax. Coarsened  interstitial markings are similar to prior. Thickening of the fissures, similar to the prior with  linear opacities at the lung bases. No pleural effusion at the costophrenic sulcus. No new confluent airspace disease. No displaced fracture.  Degenerative changes of the spine. IMPRESSION: Similar appearance of the chest x-ray with chronic lung changes and thickening of the right-sided fissures. No evidence of overt edema or lobar pneumonia. Electronically Signed   By: Corrie Mckusick D.O.   On: 09/24/2018 16:28   Dg Chest 2 View  Result Date: 09/23/2018 CLINICAL DATA:  Dyspnea EXAM: CHEST - 2 VIEW COMPARISON:  05/14/2018 chest radiograph. FINDINGS: Stable cardiomediastinal silhouette with mild cardiomegaly. No pneumothorax. No pleural effusion. Stable loculated small effusion in the right minor and major fissures. No overt pulmonary edema. Mild bibasilar scarring versus atelectasis. IMPRESSION: 1. Stable mild cardiomegaly without overt pulmonary edema. 2. Stable loculated small right pleural effusion in the minor and major fissures. 3. Mild bibasilar scarring versus atelectasis. Electronically Signed   By: Ilona Sorrel M.D.   On: 09/23/2018 21:35    Procedures Procedures (including critical care time)  Medications Ordered in ED Medications - No data to display   Initial Impression / Assessment and Plan / ED Course  I have reviewed the triage vital signs and the nursing notes.  Pertinent labs & imaging results that were available during my care of the patient were reviewed by me and considered in my medical decision making (see chart for details).     72 year old female PMH significant for CKD stage IV, A. fib, RA, acute diastolic heart failure, HTN who presents with chief complaint of shortness of breath.  History as above.  Of note this is patient's third visit with same chief complaint with progressive worsening of symptoms.  Non-tachycardic, currently with 3 L O2 requirement patient does not  wear oxygen at home.  Labs and imaging performed.  Patient with improving BNP of 708 from 834 yesterday.  Patient with mild looks cytosis 12.1 from 11.9, creatinine near baseline, patient is not in DKA.  CXR negative for pneumonia or pulmonary edema.  EKG identical to prior WNL.  Patient able to ambulate with out significant hypoxia, lowest SPO2 93% on room air.  Patient noted to be sitting hopefully in room on room air without desaturations.  Patient with negative work-up, improvement of symptoms, and without oxygen requirement.  Patient stable for discharge.  Advised follow with PCP for management of symptoms.  Advised continuation of home Lasix advised not to miss doses.  Patient states understanding and agreement with plan.   Final Clinical Impressions(s) / ED Diagnoses   Final diagnoses:  Shortness of breath  Hypoxia    ED Discharge Orders    None       Keenan Bachelor, MD 09/25/18 2992    Varney Biles, MD 09/26/18 [phone]

## 2018-09-24 NOTE — ED Notes (Signed)
Pt ambulated to bathroom without any problems.  Pt's 02 sat's remained at 93 and 94% on room air.

## 2018-09-24 NOTE — ED Notes (Signed)
Resident at bedside.  

## 2018-09-24 NOTE — Discharge Instructions (Addendum)
Be sure to take your afternoon Lasix as prescribed.  Follow-up with your primary doctor tomorrow as scheduled, and return to the emergency department in the meantime if symptoms significantly worsen or change.

## 2018-09-24 NOTE — ED Triage Notes (Addendum)
[facility]- pt coming from pcp with complaint of hypoxia. At her PCP she stood up and ambulated, became hypoxic and had near syncopal episode. Pt is alert and oriented. Pt does not use O2 at home but currently on 3L.   CBG 169

## 2018-09-24 NOTE — ED Provider Notes (Signed)
[facility] EMERGENCY DEPARTMENT Provider Note   CSN: [id number] Arrival date & time: 09/23/18  2042     History   Chief Complaint Chief Complaint  Patient presents with  . Fatigue  . Shortness of Breath  . Emesis    HPI Melissa Bartlett is a 73 y.o. female.  Patient is a 72 year old female with past medical history of CHF, A. fib, chronic renal insufficiency, anemia, and hypertension.  She presents for evaluation of weakness and dyspnea.  She states for the past several days that she has had dyspnea on exertion and generalized weakness.  She checked her oxygen levels at home and were reported in the 80s.  She called her doctor who told her to come to the ER to be evaluated.  She denies any fevers, chills, or chest pain.  She denies any productive cough.  The history is provided by the patient.  Shortness of Breath  This is a new problem. The average episode lasts 3 days. The problem occurs continuously.The problem has been gradually worsening. Associated symptoms include vomiting. Pertinent negatives include no fever, no cough and no sputum production. She has tried nothing for the symptoms.  Emesis   Pertinent negatives include no cough and no fever.    Past Medical History:  Diagnosis Date  . Anemia   . CHF (congestive heart failure) ([place])   . CKD (chronic kidney disease), stage IV ([place])   . Complication of anesthesia    " I aspirated during my gallbladder surgery"  . Diabetes ([place])   . GERD (gastroesophageal reflux disease)   . Heart murmur   . HTN (hypertension)   . Morbid obesity with BMI of 45.0-49.9, adult ([place])   . OSA (obstructive sleep apnea)    with AHI 11/hr with nocturnal hypoxemia   . PAF (paroxysmal atrial fibrillation) ([place])    s/p DCCV in 8/17  . Pneumonia   . RA (rheumatoid arthritis) ([place])   . Wears glasses   . Wears partial dentures     Patient Active Problem List   Diagnosis Date Noted  . AKI (acute kidney injury) ([place])  02/10/2018  . Acute respiratory failure with hypoxia ([place]) 01/27/2018  . Permanent atrial fibrillation ([place]) 01/27/2018  . RA (rheumatoid arthritis) ([place]) 01/27/2018  . Acute diastolic CHF (congestive heart failure) ([place]) 01/27/2018  . Chest pain, rule out acute myocardial infarction 10/28/2017  . Syncope 03/14/2017  . Chest pain 03/14/2017  . OSA (obstructive sleep apnea)   . Paroxysmal atrial fibrillation ([place]) 07/31/2016  . CKD (chronic kidney disease) stage 4, GFR 15-29 ml/min (HCC) 07/31/2016  . Essential hypertension 07/09/2015  . Morbid obesity ([place]) 07/09/2015  . Diabetes ([place]) 03/07/2014    Past Surgical History:  Procedure Laterality Date  . ABDOMINAL HYSTERECTOMY    . ANKLE FUSION Right   . AV FISTULA PLACEMENT Right 07/14/2018   Procedure: ARTERIOVENOUS (AV) FISTULA CREATION BRACHIOCEPHALIC;  Surgeon: Elam Dutch, MD;  Location: [place];  Service: Vascular;  Laterality: Right;  . BREAST BIOPSY    . CARDIOVERSION N/A 08/15/2016   Procedure: CARDIOVERSION;  Surgeon: Jerline Pain, MD;  Location: [facility] ENDOSCOPY;  Service: Cardiovascular;  Laterality: N/A;  . CHOLECYSTECTOMY    . COLONOSCOPY W/ BIOPSIES AND POLYPECTOMY    . DILATION AND CURETTAGE OF UTERUS    . GALLBLADDER SURGERY    . HYSTEROTOMY     Patrtial  . HYSTEROTOMY     Complete  . KNEE ARTHROSCOPY    .  LEFT HEART CATH AND CORONARY ANGIOGRAPHY N/A 11/16/2017   Procedure: LEFT HEART CATH AND CORONARY ANGIOGRAPHY;  Surgeon: Martinique, Peter M, MD;  Location: [place] CV LAB;  Service: Cardiovascular;  Laterality: N/A;  . MULTIPLE TOOTH EXTRACTIONS    . TUBAL LIGATION       OB History   None      Home Medications    Prior to Admission medications   Medication Sig Start Date End Date Taking? Authorizing Provider  atorvastatin (LIPITOR) 20 MG tablet Take 1 tablet (20 mg total) by mouth daily. 09/14/18 12/13/18  Lyda Jester M, PA-C  brimonidine-timolol (COMBIGAN) 0.2-0.5 % ophthalmic solution Place  1 drop into both eyes 2 (two) times daily.    [provider]  Calcium Carbonate-Vitamin D3 (CALCIUM 600/VITAMIN D) 600-400 MG-UNIT TABS Take 1 tablet by mouth 2 (two) times daily.    [provider]  carvedilol (COREG) 25 MG tablet TAKE 1 TABLET BY MOUTH TWICE A DAY WITH A MEAL 06/21/18   Jerline Pain, MD  Darbepoetin Alfa (ARANESP) 60 MCG/0.3ML SOSY injection Inject 60 mcg every 30 (thirty) days into the skin.    [provider]  diltiazem (CARDIZEM CD) 120 MG 24 hr capsule Take 1 capsule (120 mg total) by mouth daily. 09/14/18 12/13/18  Lyda Jester M, PA-C  diphenhydramine-acetaminophen (TYLENOL PM) 25-500 MG TABS tablet Take 1 tablet by mouth at bedtime.     [provider]  furosemide (LASIX) 20 MG tablet Take 80 mg by mouth 2 (two) times daily.     [provider]  hydrALAZINE (APRESOLINE) 50 MG tablet Take 50 mg by mouth 4 (four) times daily.    [provider]  leflunomide (ARAVA) 10 MG tablet Take 10 mg by mouth daily.     [provider]  Magnesium Oxide 400 (240 Mg) MG TABS Take 400 mg by mouth 2 (two) times daily. 05/27/18   [provider]  pantoprazole (PROTONIX) 40 MG tablet Take 1 tablet (40 mg total) by mouth daily. 10/29/17   Geradine Girt, DO  potassium chloride SA (KLOR-CON M20) 20 MEQ tablet Take 1 tablet (20 mEq total) by mouth daily. 02/12/18   Jerline Pain, MD  predniSONE (DELTASONE) 5 MG tablet Take 5 mg by mouth daily.  08/22/17   [provider]  PRESCRIPTION MEDICATION Inhale into the lungs at bedtime. CPAP    [provider]  Probiotic Product (ALIGN PO) Take 1 tablet by mouth at bedtime.     [provider]  Rivaroxaban (XARELTO) 15 MG TABS tablet Take 1 tablet (15 mg total) daily with supper by mouth. 11/17/17   Martinique, Peter M, MD  vitamin B-12 (CYANOCOBALAMIN) 1000 MCG tablet Take 1,000 mcg by mouth daily.    [provider]    Family History Family  History  Problem Relation Age of Onset  . Hypertension Mother        Family HX Diabetes  . Stomach cancer Father     Social History Social History   Tobacco Use  . Smoking status: Former Smoker    Types: Cigarettes    Last attempt to quit: 04/08/2016    Years since quitting: 2.4  . Smokeless tobacco: Never Used  Substance Use Topics  . Alcohol use: No    Alcohol/week: 0.0 standard drinks  . Drug use: No     Allergies   Enalapril and Codeine   Review of Systems Review of Systems  Constitutional: Negative for fever.  Respiratory: Positive for shortness of breath. Negative for cough and sputum production.   Gastrointestinal: Positive for vomiting.  All other systems reviewed and are negative.    Physical Exam Updated Vital Signs BP (!) 150/97 (BP Location: Right Arm)   Pulse 94   Temp 99 F (37.2 C) (Oral)   Resp 18   Ht 5' 2.5" (1.588 m)   Wt 108.9 kg   LMP  (LMP Unknown)   SpO2 93%   BMI 43.20 kg/m   Physical Exam  Constitutional: She is oriented to person, place, and time. She appears well-developed and well-nourished. No distress.  HENT:  Head: Normocephalic and atraumatic.  Neck: Normal range of motion. Neck supple.  Cardiovascular: Normal rate and regular rhythm. Exam reveals no gallop and no friction rub.  No murmur heard. Pulmonary/Chest: Effort normal. No respiratory distress. She has no wheezes. She has rales in the right lower field and the left lower field.  There are slight rales in the bases bilaterally.  Abdominal: Soft. Bowel sounds are normal. She exhibits no distension. There is no tenderness.  Musculoskeletal: Normal range of motion.       Right lower leg: Normal. She exhibits no edema.       Left lower leg: Normal. She exhibits no edema.  Neurological: She is alert and oriented to person, place, and time.  Skin: Skin is warm and dry. She is not diaphoretic.  Nursing note and vitals reviewed.    ED Treatments / Results  Labs (all  labs ordered are listed, but only abnormal results are displayed) Labs Reviewed  BASIC METABOLIC PANEL - Abnormal; Notable for the following components:      Result Value   Glucose, Bld 176 (*)    BUN 46 (*)    Creatinine, Ser 3.47 (*)    GFR calc non Af Amer 12 (*)    GFR calc Af Amer 14 (*)    All other components within normal limits  CBC - Abnormal; Notable for the following components:   WBC 11.9 (*)    RBC 3.81 (*)    Hemoglobin 9.9 (*)    HCT 35.8 (*)    MCHC 27.7 (*)    RDW 17.0 (*)    All other components within normal limits  BRAIN NATRIURETIC PEPTIDE - Abnormal; Notable for the following components:   B Natriuretic Peptide 834.1 (*)    All other components within normal limits  I-STAT TROPONIN, ED    EKG None  Radiology Dg Chest 2 View  Result Date: 09/23/2018 CLINICAL DATA:  Dyspnea EXAM: CHEST - 2 VIEW COMPARISON:  05/14/2018 chest radiograph. FINDINGS: Stable cardiomediastinal silhouette with mild cardiomegaly. No pneumothorax. No pleural effusion. Stable loculated small effusion in the right minor and major fissures. No overt pulmonary edema. Mild bibasilar scarring versus atelectasis. IMPRESSION: 1. Stable mild cardiomegaly without overt pulmonary edema. 2. Stable loculated small right pleural effusion in the minor and major fissures. 3. Mild bibasilar scarring versus atelectasis. Electronically Signed   By: Ilona Sorrel M.D.   On: 09/23/2018 21:35    Procedures Procedures (including critical care time)  Medications Ordered in ED Medications - No data to display   Initial Impression / Assessment and Plan / ED Course  I have reviewed the triage vital signs and the nursing notes.  Pertinent labs & imaging results that were available during my care of the patient were reviewed by me and considered in my medical decision making (see chart for details).  Patient presents  with dyspnea on exertion and weakness.  Her work-up today reveals an elevated BNP, however  studies that are otherwise unremarkable.  She does admit to me she has missed several doses of her afternoon Lasix in the past week and I suspect that this is the issue.  I will advise her to watch her fluid intake and to make sure she takes her afternoon Lasix.  Final Clinical Impressions(s) / ED Diagnoses   Final diagnoses:  None    ED Discharge Orders    None       Veryl Speak, MD 09/24/18 [phone]

## 2018-10-01 ENCOUNTER — Telehealth: Payer: Self-pay | Admitting: Cardiology

## 2018-10-01 NOTE — Telephone Encounter (Signed)
  1) What problem are you experiencing? Patient has been unable to get a new face mask and filters and has not been using her machine because her face mask is cracked  2) Who is your medical equipment company? [employer]   Please route to the sleep study assistant.

## 2018-10-06 ENCOUNTER — Ambulatory Visit (HOSPITAL_COMMUNITY)
Admission: RE | Admit: 2018-10-06 | Discharge: 2018-10-06 | Disposition: A | Discharge status: Home / Self Care | Payer: Medicare Other | Source: Ambulatory Visit | Attending: Nephrology | Admitting: Nephrology

## 2018-10-06 DIAGNOSIS — I110 Hypertensive heart disease with heart failure: Secondary | ICD-10-CM | POA: Diagnosis not present

## 2018-10-06 DIAGNOSIS — D631 Anemia in chronic kidney disease: Secondary | ICD-10-CM | POA: Diagnosis not present

## 2018-10-06 DIAGNOSIS — I5030 Unspecified diastolic (congestive) heart failure: Secondary | ICD-10-CM | POA: Diagnosis not present

## 2018-10-06 DIAGNOSIS — Z23 Encounter for immunization: Secondary | ICD-10-CM | POA: Diagnosis not present

## 2018-10-06 DIAGNOSIS — N184 Chronic kidney disease, stage 4 (severe): Secondary | ICD-10-CM | POA: Diagnosis not present

## 2018-10-06 DIAGNOSIS — E1122 Type 2 diabetes mellitus with diabetic chronic kidney disease: Secondary | ICD-10-CM | POA: Diagnosis not present

## 2018-10-06 DIAGNOSIS — E785 Hyperlipidemia, unspecified: Secondary | ICD-10-CM | POA: Diagnosis not present

## 2018-10-06 LAB — CBC WITH DIFFERENTIAL/PLATELET
Abs Immature Granulocytes: 0.08 10*3/uL — ABNORMAL HIGH (ref 0.00–0.07)
Basophils Absolute: 0 10*3/uL (ref 0.0–0.1)
Basophils Relative: 0 %
EOS ABS: 0.1 10*3/uL (ref 0.0–0.5)
EOS PCT: 1 %
HEMATOCRIT: 34.3 % — AB (ref 36.0–46.0)
HEMOGLOBIN: 9 g/dL — AB (ref 12.0–15.0)
Immature Granulocytes: 1 %
LYMPHS ABS: 1.1 10*3/uL (ref 0.7–4.0)
Lymphocytes Relative: 9 %
MCH: 24.5 pg — ABNORMAL LOW (ref 26.0–34.0)
MCHC: 26.2 g/dL — ABNORMAL LOW (ref 30.0–36.0)
MCV: 93.2 fL (ref 80.0–100.0)
MONOS PCT: 7 %
Monocytes Absolute: 0.9 10*3/uL (ref 0.1–1.0)
Neutro Abs: 10.8 10*3/uL — ABNORMAL HIGH (ref 1.7–7.7)
Neutrophils Relative %: 82 %
Platelets: 257 10*3/uL (ref 150–400)
RBC: 3.68 MIL/uL — ABNORMAL LOW (ref 3.87–5.11)
RDW: 17 % — AB (ref 11.5–15.5)
WBC: 12.9 10*3/uL — ABNORMAL HIGH (ref 4.0–10.5)
nRBC: 0 % (ref 0.0–0.2)

## 2018-10-06 LAB — POCT HEMOGLOBIN-HEMACUE: Hemoglobin: 9.6 g/dL — ABNORMAL LOW (ref 12.0–15.0)

## 2018-10-06 MED ORDER — DARBEPOETIN ALFA 60 MCG/0.3ML IJ SOSY
60.0000 ug | PREFILLED_SYRINGE | INTRAMUSCULAR | Status: DC
  Administered 2018-10-06: 60 ug via SUBCUTANEOUS

## 2018-10-06 MED ORDER — DARBEPOETIN ALFA 60 MCG/0.3ML IJ SOSY
PREFILLED_SYRINGE | INTRAMUSCULAR | Status: AC
  Filled 2018-10-06: qty 0.3

## 2018-10-06 NOTE — Telephone Encounter (Signed)
Reached out to patient and she has now received her supplies. Pt is aware and agreeable to this treatment.

## 2018-10-08 LAB — PTH, INTACT AND CALCIUM
CALCIUM TOTAL (PTH): 10.3 mg/dL (ref 8.7–10.3)
PTH: 210 pg/mL — AB (ref 15–65)

## 2018-10-12 DIAGNOSIS — R922 Inconclusive mammogram: Secondary | ICD-10-CM | POA: Diagnosis not present

## 2018-10-27 DIAGNOSIS — H4053X1 Glaucoma secondary to other eye disorders, bilateral, mild stage: Secondary | ICD-10-CM | POA: Diagnosis not present

## 2018-10-27 DIAGNOSIS — E119 Type 2 diabetes mellitus without complications: Secondary | ICD-10-CM | POA: Diagnosis not present

## 2018-10-27 DIAGNOSIS — H04123 Dry eye syndrome of bilateral lacrimal glands: Secondary | ICD-10-CM | POA: Diagnosis not present

## 2018-10-28 DIAGNOSIS — D631 Anemia in chronic kidney disease: Secondary | ICD-10-CM | POA: Diagnosis not present

## 2018-10-28 DIAGNOSIS — E1122 Type 2 diabetes mellitus with diabetic chronic kidney disease: Secondary | ICD-10-CM | POA: Diagnosis not present

## 2018-10-28 DIAGNOSIS — R801 Persistent proteinuria, unspecified: Secondary | ICD-10-CM | POA: Diagnosis not present

## 2018-10-28 DIAGNOSIS — I129 Hypertensive chronic kidney disease with stage 1 through stage 4 chronic kidney disease, or unspecified chronic kidney disease: Secondary | ICD-10-CM | POA: Diagnosis not present

## 2018-10-28 DIAGNOSIS — N184 Chronic kidney disease, stage 4 (severe): Secondary | ICD-10-CM | POA: Diagnosis not present

## 2018-10-28 DIAGNOSIS — N189 Chronic kidney disease, unspecified: Secondary | ICD-10-CM | POA: Diagnosis not present

## 2018-10-28 DIAGNOSIS — N2581 Secondary hyperparathyroidism of renal origin: Secondary | ICD-10-CM | POA: Diagnosis not present

## 2018-10-28 DIAGNOSIS — N179 Acute kidney failure, unspecified: Secondary | ICD-10-CM | POA: Diagnosis not present

## 2018-10-28 DIAGNOSIS — M069 Rheumatoid arthritis, unspecified: Secondary | ICD-10-CM | POA: Diagnosis not present

## 2018-10-28 DIAGNOSIS — I480 Paroxysmal atrial fibrillation: Secondary | ICD-10-CM | POA: Diagnosis not present

## 2018-10-28 DIAGNOSIS — I770 Arteriovenous fistula, acquired: Secondary | ICD-10-CM | POA: Diagnosis not present

## 2018-10-28 DIAGNOSIS — R0902 Hypoxemia: Secondary | ICD-10-CM | POA: Diagnosis not present

## 2018-11-02 ENCOUNTER — Other Ambulatory Visit (HOSPITAL_COMMUNITY): Payer: Self-pay | Admitting: *Deleted

## 2018-11-03 ENCOUNTER — Ambulatory Visit (HOSPITAL_COMMUNITY)
Admission: RE | Admit: 2018-11-03 | Discharge: 2018-11-03 | Disposition: A | Discharge status: Home / Self Care | Payer: Medicare Other | Source: Ambulatory Visit | Attending: Nephrology | Admitting: Nephrology

## 2018-11-03 VITALS — BP 157/95 | HR 83 | Temp 98.0°F | Resp 20 | Ht 62.5 in | Wt 242.0 lb

## 2018-11-03 DIAGNOSIS — N184 Chronic kidney disease, stage 4 (severe): Secondary | ICD-10-CM | POA: Diagnosis not present

## 2018-11-03 DIAGNOSIS — D631 Anemia in chronic kidney disease: Secondary | ICD-10-CM | POA: Insufficient documentation

## 2018-11-03 LAB — CBC WITH DIFFERENTIAL/PLATELET
Abs Immature Granulocytes: 0.07 10*3/uL (ref 0.00–0.07)
BASOS ABS: 0 10*3/uL (ref 0.0–0.1)
Basophils Relative: 0 %
EOS ABS: 0.1 10*3/uL (ref 0.0–0.5)
EOS PCT: 1 %
HEMATOCRIT: 32.1 % — AB (ref 36.0–46.0)
HEMOGLOBIN: 8.4 g/dL — AB (ref 12.0–15.0)
Immature Granulocytes: 1 %
LYMPHS ABS: 1.2 10*3/uL (ref 0.7–4.0)
LYMPHS PCT: 10 %
MCH: 23.9 pg — AB (ref 26.0–34.0)
MCHC: 26.2 g/dL — AB (ref 30.0–36.0)
MCV: 91.5 fL (ref 80.0–100.0)
MONO ABS: 0.9 10*3/uL (ref 0.1–1.0)
MONOS PCT: 8 %
Neutro Abs: 9.6 10*3/uL — ABNORMAL HIGH (ref 1.7–7.7)
Neutrophils Relative %: 80 %
Platelets: 255 10*3/uL (ref 150–400)
RBC: 3.51 MIL/uL — ABNORMAL LOW (ref 3.87–5.11)
RDW: 17.4 % — AB (ref 11.5–15.5)
WBC: 11.9 10*3/uL — ABNORMAL HIGH (ref 4.0–10.5)
nRBC: 0 % (ref 0.0–0.2)

## 2018-11-03 MED ORDER — SODIUM CHLORIDE 0.9 % IV SOLN
510.0000 mg | INTRAVENOUS | Status: DC
  Administered 2018-11-03: 510 mg via INTRAVENOUS
  Filled 2018-11-03: qty 17

## 2018-11-03 MED ORDER — DARBEPOETIN ALFA 60 MCG/0.3ML IJ SOSY
60.0000 ug | PREFILLED_SYRINGE | INTRAMUSCULAR | Status: DC
  Administered 2018-11-03: 60 ug via SUBCUTANEOUS

## 2018-11-03 MED ORDER — DARBEPOETIN ALFA 60 MCG/0.3ML IJ SOSY
PREFILLED_SYRINGE | INTRAMUSCULAR | Status: AC
  Filled 2018-11-03: qty 0.3

## 2018-11-08 ENCOUNTER — Other Ambulatory Visit: Payer: Self-pay

## 2018-11-08 DIAGNOSIS — T82510A Breakdown (mechanical) of surgically created arteriovenous fistula, initial encounter: Secondary | ICD-10-CM

## 2018-11-10 ENCOUNTER — Telehealth: Payer: Self-pay

## 2018-11-10 ENCOUNTER — Other Ambulatory Visit: Payer: Medicare Other | Admitting: *Deleted

## 2018-11-10 ENCOUNTER — Ambulatory Visit (HOSPITAL_COMMUNITY)
Admission: RE | Admit: 2018-11-10 | Discharge: 2018-11-10 | Disposition: A | Discharge status: Home / Self Care | Payer: Medicare Other | Source: Ambulatory Visit | Attending: Nephrology | Admitting: Nephrology

## 2018-11-10 DIAGNOSIS — N189 Chronic kidney disease, unspecified: Secondary | ICD-10-CM | POA: Diagnosis not present

## 2018-11-10 DIAGNOSIS — Z79899 Other long term (current) drug therapy: Secondary | ICD-10-CM

## 2018-11-10 DIAGNOSIS — D631 Anemia in chronic kidney disease: Secondary | ICD-10-CM | POA: Insufficient documentation

## 2018-11-10 MED ORDER — SODIUM CHLORIDE 0.9 % IV SOLN
510.0000 mg | INTRAVENOUS | Status: DC
  Administered 2018-11-10: 510 mg via INTRAVENOUS
  Filled 2018-11-10: qty 17

## 2018-11-10 NOTE — Telephone Encounter (Signed)
Follow up  Per patient the nurse states that they could not honor any other doctor request to draw blood per previous message. Please call to discuss.

## 2018-11-10 NOTE — Telephone Encounter (Signed)
Call received from lab stating patient arrived for LIPIDS and LFTS and multiple unsuccessful attempts have been made. Patient is on the way to receive an iron infusion. Written Rx was given to patient to see if they would be able to draw labs and fax results to [place]. Will forward to team to make aware.

## 2018-11-12 ENCOUNTER — Telehealth: Payer: Self-pay | Admitting: Cardiology

## 2018-11-12 NOTE — Telephone Encounter (Signed)
New Message            Patient is needing to know if she needs to fast or not. Patient was unable to get blood work when she went to the hospital. Patient appt is Monday, she needs a call back today.

## 2018-11-12 NOTE — Telephone Encounter (Signed)
PT AWARE NEEDS TO BE FASTING FOR LAB WORK .Adonis Housekeeper

## 2018-11-15 ENCOUNTER — Ambulatory Visit (INDEPENDENT_AMBULATORY_CARE_PROVIDER_SITE_OTHER): Payer: Medicare Other | Admitting: Cardiology

## 2018-11-15 ENCOUNTER — Encounter: Payer: Self-pay | Admitting: Cardiology

## 2018-11-15 VITALS — BP 122/64 | HR 91 | Ht 62.5 in | Wt 247.4 lb

## 2018-11-15 DIAGNOSIS — I4821 Permanent atrial fibrillation: Secondary | ICD-10-CM

## 2018-11-15 DIAGNOSIS — N184 Chronic kidney disease, stage 4 (severe): Secondary | ICD-10-CM

## 2018-11-15 NOTE — Progress Notes (Signed)
Cardiology Office Note:    Date:  11/15/2018   ID:  Melissa Bartlett, DOB Dec 13, 1946, MRN [id number]  PCP:  Donald Prose, MD  Cardiologist:  Candee Furbish, MD  Electrophysiologist:  None   Referring MD: Donald Prose, MD    History of Present Illness:    Melissa Bartlett is a 72 y.o. female here for follow-up of permanent atrial fibrillation.  Has stage IV chronic kidney disease seen by nephrology last in the hospital in May 2019 with heart failure as well as V. tach nonsustained.  Creatinine peaked at 3.7.  Had some dietary indiscretion.  Fistula was placed on 07/14/2018.  Past Medical History:  Diagnosis Date  . Anemia   . CHF (congestive heart failure) ([place])   . CKD (chronic kidney disease), stage IV ([place])   . Complication of anesthesia    " I aspirated during my gallbladder surgery"  . Diabetes ([place])   . GERD (gastroesophageal reflux disease)   . Heart murmur   . HTN (hypertension)   . Morbid obesity with BMI of 45.0-49.9, adult ([place])   . OSA (obstructive sleep apnea)    with AHI 11/hr with nocturnal hypoxemia   . PAF (paroxysmal atrial fibrillation) ([place])    s/p DCCV in 8/17  . Pneumonia   . RA (rheumatoid arthritis) ([place])   . Wears glasses   . Wears partial dentures     Past Surgical History:  Procedure Laterality Date  . ABDOMINAL HYSTERECTOMY    . ANKLE FUSION Right   . AV FISTULA PLACEMENT Right 07/14/2018   Procedure: ARTERIOVENOUS (AV) FISTULA CREATION BRACHIOCEPHALIC;  Surgeon: Elam Dutch, MD;  Location: [place];  Service: Vascular;  Laterality: Right;  . BREAST BIOPSY    . CARDIOVERSION N/A 08/15/2016   Procedure: CARDIOVERSION;  Surgeon: Jerline Pain, MD;  Location: [facility] ENDOSCOPY;  Service: Cardiovascular;  Laterality: N/A;  . CHOLECYSTECTOMY    . COLONOSCOPY W/ BIOPSIES AND POLYPECTOMY    . DILATION AND CURETTAGE OF UTERUS    . GALLBLADDER SURGERY    . HYSTEROTOMY     Patrtial  . HYSTEROTOMY     Complete  . KNEE ARTHROSCOPY    . LEFT HEART CATH  AND CORONARY ANGIOGRAPHY N/A 11/16/2017   Procedure: LEFT HEART CATH AND CORONARY ANGIOGRAPHY;  Surgeon: Martinique, Peter M, MD;  Location: [place] CV LAB;  Service: Cardiovascular;  Laterality: N/A;  . MULTIPLE TOOTH EXTRACTIONS    . TUBAL LIGATION      Current Medications: Current Meds  Medication Sig  . atorvastatin (LIPITOR) 20 MG tablet Take 1 tablet (20 mg total) by mouth daily.  . brimonidine-timolol (COMBIGAN) 0.2-0.5 % ophthalmic solution Place 1 drop into both eyes 2 (two) times daily.  . Calcium Carbonate-Vitamin D3 (CALCIUM 600/VITAMIN D) 600-400 MG-UNIT TABS Take 1 tablet by mouth 2 (two) times daily.  . carvedilol (COREG) 25 MG tablet TAKE 1 TABLET BY MOUTH TWICE A DAY WITH A MEAL  . Darbepoetin Alfa (ARANESP) 60 MCG/0.3ML SOSY injection Inject 60 mcg every 30 (thirty) days into the skin.  Marland Kitchen diltiazem (CARDIZEM CD) 120 MG 24 hr capsule Take 1 capsule (120 mg total) by mouth daily.  . diphenhydramine-acetaminophen (TYLENOL PM) 25-500 MG TABS tablet Take 1 tablet by mouth at bedtime.   . furosemide (LASIX) 20 MG tablet Take 80 mg by mouth 2 (two) times daily.   . hydrALAZINE (APRESOLINE) 50 MG tablet Take 50 mg by mouth 3 (three) times daily.   Marland Kitchen leflunomide (ARAVA)  10 MG tablet Take 10 mg by mouth daily.   . Magnesium Oxide 400 (240 Mg) MG TABS Take 400 mg by mouth 2 (two) times daily.  . pantoprazole (PROTONIX) 40 MG tablet Take 1 tablet (40 mg total) by mouth daily.  . potassium chloride SA (KLOR-CON M20) 20 MEQ tablet Take 1 tablet (20 mEq total) by mouth daily.  . predniSONE (DELTASONE) 5 MG tablet Take 5 mg by mouth daily.   Marland Kitchen PRESCRIPTION MEDICATION Inhale into the lungs at bedtime. CPAP  . Probiotic Product (ALIGN PO) Take 1 tablet by mouth at bedtime.   . Rivaroxaban (XARELTO) 15 MG TABS tablet Take 1 tablet (15 mg total) daily with supper by mouth.  . vitamin B-12 (CYANOCOBALAMIN) 1000 MCG tablet Take 1,000 mcg by mouth daily.     Allergies:   Enalapril and Codeine    Social History   Socioeconomic History  . Marital status: Married    Spouse name: Not on file  . Number of children: Not on file  . Years of education: Not on file  . Highest education level: Not on file  Occupational History  . Not on file  Social Needs  . Financial resource strain: Not on file  . Food insecurity:    Worry: Not on file    Inability: Not on file  . Transportation needs:    Medical: Not on file    Non-medical: Not on file  Tobacco Use  . Smoking status: Former Smoker    Types: Cigarettes    Last attempt to quit: 04/08/2016    Years since quitting: 2.6  . Smokeless tobacco: Never Used  Substance and Sexual Activity  . Alcohol use: No    Alcohol/week: 0.0 standard drinks  . Drug use: No  . Sexual activity: Not on file  Lifestyle  . Physical activity:    Days per week: Not on file    Minutes per session: Not on file  . Stress: Not on file  Relationships  . Social connections:    Talks on phone: Not on file    Gets together: Not on file    Attends religious service: Not on file    Active member of club or organization: Not on file    Attends meetings of clubs or organizations: Not on file    Relationship status: Not on file  Other Topics Concern  . Not on file  Social History Narrative  . Not on file     Family History: The patient's family history includes Hypertension in her mother; Stomach cancer in her father.  ROS:   Please see the history of present illness.     All other systems reviewed and are negative.  EKGs/Labs/Other Studies Reviewed:    The following studies were reviewed today:  ECHO 12/2017 - Left ventricle: The cavity size was normal. Wall thickness was   increased in a pattern of mild LVH. The estimated ejection   fraction was 55%. The study is not technically sufficient to   allow evaluation of LV diastolic function. - Aortic valve: There was mild regurgitation. - Mitral valve: There was mild to moderate  regurgitation. - Left atrium: The atrium was mildly dilated. - Right atrium: The atrium was mildly dilated. - Pulmonary arteries: PA peak pressure: 40 mm Hg (S).  Cath 2018 1. Normal coronary angiography 2. Elevated LVEDP  EKG:  EKG is not ordered today.  09/24/2018-A. fib 84 personally reviewed  Recent Labs: 05/14/2018: TSH 1.301 05/16/2018: Magnesium 2.3 05/17/2018:  ALT 13 09/24/2018: B Natriuretic Peptide 708.2; BUN 49; Creatinine, Ser 3.65; Potassium 4.6; Sodium 143 11/03/2018: Hemoglobin 8.4; Platelets 255  Recent Lipid Panel    Component Value Date/Time   CHOL 150 07/06/2011 0216   TRIG 123 07/06/2011 0216   HDL 46 07/06/2011 0216   CHOLHDL 3.3 07/06/2011 0216   VLDL 25 07/06/2011 0216   LDLCALC 79 07/06/2011 0216    Physical Exam:    VS:  BP 122/64   Pulse 91   Ht 5' 2.5" (1.588 m)   Wt 247 lb 6.4 oz (112.2 kg)   LMP  (LMP Unknown)   SpO2 (!) 79%   BMI 44.53 kg/m     Wt Readings from Last 3 Encounters:  11/15/18 247 lb 6.4 oz (112.2 kg)  11/10/18 239 lb 2 oz (108.5 kg)  11/03/18 242 lb (109.8 kg)     GEN:  Well nourished, well developed in no acute distress obese HEENT: Normal NECK: No JVD; No carotid bruits LYMPHATICS: No lymphadenopathy CARDIAC: irreg irreg, no murmurs, rubs, gallops RESPIRATORY:  Clear to auscultation without rales, wheezing or rhonchi  ABDOMEN: Soft, non-tender, non-distended MUSCULOSKELETAL:  No edema; No deformity  SKIN: Warm and dry, left arm ecchymosis noted from prior blood draw NEUROLOGIC:  Alert and oriented x 3 PSYCHIATRIC:  Normal affect   ASSESSMENT:    1. Permanent atrial fibrillation   2. CKD (chronic kidney disease) stage 4, GFR 15-29 ml/min (HCC)   3. Morbid obesity ([place])    PLAN:    In order of problems listed above:  Chronic atrial fibrillation/permanent - Good overall rate control with Cardizem. -Also on carvedilol 25 twice a day.  Chronic anticoagulation -Xarelto 15 mg, dose adjusted for decreased  GFR.  Obstructive sleep apnea -CPAP  Chronic kidney disease stage IV -Nephrology notes reviewed.  Her creatinine clearance is currently 25.  She may continue with Xarelto for now.  Eventually, she will need to transition to Coumadin unless trial data becomes more robust with Eliquis.  Morbid obesity -Continue to encourage weight loss.  This is also contribute into her fatigue as well as her anemia of chronic kidney disease, hemoglobin 8.4.    Medication Adjustments/Labs and Tests Ordered: Current medicines are reviewed at length with the patient today.  Concerns regarding medicines are outlined above.  No orders of the defined types were placed in this encounter.  No orders of the defined types were placed in this encounter.   Patient Instructions  Medication Instructions:  The current medical regimen is effective;  continue present plan and medications.  If you need a refill on your cardiac medications before your next appointment, please call your pharmacy.   Follow-Up: At [facility], you and your health needs are our priority.  As part of our continuing mission to provide you with exceptional heart care, we have created designated Provider Care Teams.  These Care Teams include your primary Cardiologist (physician) and Advanced Practice Providers (APPs -  Physician Assistants and Nurse Practitioners) who all work together to provide you with the care you need, when you need it. You will need a follow up appointment in 6 months.  Please call our office 2 months in advance to schedule this appointment.  You may see Candee Furbish, MD or one of the following Advanced Practice Providers on your designated Care Team:   Truitt Merle, NP Cecilie Kicks, NP . Kathyrn Drown, NP  Thank you for choosing [facility]!!  Signed, Candee Furbish, MD  11/15/2018 3:33 PM    [place]

## 2018-11-15 NOTE — Patient Instructions (Signed)
Medication Instructions:  The current medical regimen is effective;  continue present plan and medications.  If you need a refill on your cardiac medications before your next appointment, please call your pharmacy.   Follow-Up: At [facility], you and your health needs are our priority.  As part of our continuing mission to provide you with exceptional heart care, we have created designated Provider Care Teams.  These Care Teams include your primary Cardiologist (physician) and Advanced Practice Providers (APPs -  Physician Assistants and Nurse Practitioners) who all work together to provide you with the care you need, when you need it. You will need a follow up appointment in 6 months.  Please call our office 2 months in advance to schedule this appointment.  You may see Mark Skains, MD or one of the following Advanced Practice Providers on your designated Care Team:   Lori Gerhardt, NP Laura Ingold, NP . Jill McDaniel, NP  Thank you for choosing [facility]!!       

## 2018-11-18 NOTE — Telephone Encounter (Signed)
OK to draw lipid panel and ALT in our office. Thanks Candee Furbish, MD

## 2018-11-23 DIAGNOSIS — M150 Primary generalized (osteo)arthritis: Secondary | ICD-10-CM | POA: Diagnosis not present

## 2018-11-23 DIAGNOSIS — E790 Hyperuricemia without signs of inflammatory arthritis and tophaceous disease: Secondary | ICD-10-CM | POA: Diagnosis not present

## 2018-11-23 DIAGNOSIS — Z6841 Body Mass Index (BMI) 40.0 and over, adult: Secondary | ICD-10-CM | POA: Diagnosis not present

## 2018-11-23 DIAGNOSIS — M0579 Rheumatoid arthritis with rheumatoid factor of multiple sites without organ or systems involvement: Secondary | ICD-10-CM | POA: Diagnosis not present

## 2018-11-23 DIAGNOSIS — Z79899 Other long term (current) drug therapy: Secondary | ICD-10-CM | POA: Diagnosis not present

## 2018-11-23 DIAGNOSIS — M2550 Pain in unspecified joint: Secondary | ICD-10-CM | POA: Diagnosis not present

## 2018-11-29 ENCOUNTER — Other Ambulatory Visit: Payer: Self-pay

## 2018-11-29 ENCOUNTER — Ambulatory Visit: Payer: Self-pay | Admitting: *Deleted

## 2018-11-29 NOTE — Patient Outreach (Signed)
[place] [facility]) Care Management  11/29/2018   Melissa Bartlett [birth year] [id number]  Subjective: Successful telephone outreach to the patient for assessment.  HIPAA verified.  The patient states that she is doing well.  She denies any shortness of breath, chest pain, swelling or falls.  She states that her weight was 241.0 She had not been checking regularly.  Reinforced the importance of checking her weight daily.   She reports that she was very appreciative of [employer] RN suggesting that she call her physician when her pulse was low.  She was placed in the hospital and doing better now.  She states that her heart rate today was 77 and pulse ox was 91%.  She states that she has been extremely tired and related it to her Iron being low.  She was given an infusion of Iron and hopes that will give her some energy.  The patient states that she monitors her diet and takes her medication as prescribed. The patient was started on a new medication Febuxostat for gout. She has not started taking the medication yet.     Current Medications:  Current Outpatient Medications  Medication Sig Dispense Refill  . atorvastatin (LIPITOR) 20 MG tablet Take 1 tablet (20 mg total) by mouth daily. 90 tablet 3  . brimonidine-timolol (COMBIGAN) 0.2-0.5 % ophthalmic solution Place 1 drop into both eyes 2 (two) times daily.    . Calcium Carbonate-Vitamin D3 (CALCIUM 600/VITAMIN D) 600-400 MG-UNIT TABS Take 1 tablet by mouth 2 (two) times daily.    . carvedilol (COREG) 25 MG tablet TAKE 1 TABLET BY MOUTH TWICE A DAY WITH A MEAL 180 tablet 2  . Darbepoetin Alfa (ARANESP) 60 MCG/0.3ML SOSY injection Inject 60 mcg every 30 (thirty) days into the skin.    Marland Kitchen diltiazem (CARDIZEM CD) 120 MG 24 hr capsule Take 1 capsule (120 mg total) by mouth daily. 90 capsule 3  . diphenhydramine-acetaminophen (TYLENOL PM) 25-500 MG TABS tablet Take 1 tablet by mouth at bedtime.     . furosemide (LASIX) 20 MG tablet  Take 80 mg by mouth 2 (two) times daily.     . hydrALAZINE (APRESOLINE) 50 MG tablet Take 50 mg by mouth 3 (three) times daily.     Marland Kitchen leflunomide (ARAVA) 10 MG tablet Take 10 mg by mouth daily.     . Magnesium Oxide 400 (240 Mg) MG TABS Take 400 mg by mouth 2 (two) times daily.  6  . pantoprazole (PROTONIX) 40 MG tablet Take 1 tablet (40 mg total) by mouth daily. 30 tablet 0  . potassium chloride SA (KLOR-CON M20) 20 MEQ tablet Take 1 tablet (20 mEq total) by mouth daily. 90 tablet 3  . predniSONE (DELTASONE) 5 MG tablet Take 5 mg by mouth daily.     Marland Kitchen PRESCRIPTION MEDICATION Inhale into the lungs at bedtime. CPAP    . Probiotic Product (ALIGN PO) Take 1 tablet by mouth at bedtime.     . Rivaroxaban (XARELTO) 15 MG TABS tablet Take 1 tablet (15 mg total) daily with supper by mouth. 30 tablet 8  . vitamin B-12 (CYANOCOBALAMIN) 1000 MCG tablet Take 1,000 mcg by mouth daily.    . FEBUXOSTAT PO Take by mouth.     No current facility-administered medications for this visit.     Functional Status:  In your present state of health, do you have any difficulty performing the following activities: 07/14/2018 05/21/2018  Hearing? N N  Vision? N N  Difficulty concentrating  or making decisions? N N  Walking or climbing stairs? N N  Dressing or bathing? N N  Doing errands, shopping? - Y  Comment - HUsband provides Copywriter, advertising and eating ? - N  Using the Toilet? - N  In the past six months, have you accidently leaked urine? - N  Do you have problems with loss of bowel control? - N  Managing your Medications? - N  Managing your Finances? - N  Housekeeping or managing your Housekeeping? - N  Some recent data might be hidden    Fall/Depression Screening: Fall Risk  11/29/2018 08/27/2018 07/28/2018  Falls in the past year? 0 (No Data) (No Data)  Comment - Patient denies new/ recent falls No new/ recent falls reported today  Risk for fall due to : - - -  Risk for fall due to:  Comment - - -   PHQ 2/9 Scores 08/27/2018 05/27/2018  PHQ - 2 Score 0 0    Assessment: Patient will continue to benefit from health coach outreach for disease management and support.  THN CM Care Plan Problem One     Most Recent Value  Care Plan for Problem One  Active  THN Long Term Goal   Over the next 31 days, patient will communicate and engage with [place] for ongoing reinforcement of self-health management strategies for CHF in concurrent setting of CKD, as evidenced by patient successfully engaing with [facility] RN [place] Term Goal Start Date  11/29/18  Interventions for Problem One Long Term Goal  Reviewed signs and symptoms oof chf, medication management and reinforce daily routine.      Plan: RN Health Coach will contact patient in the month of January and patient agrees to next outreach. RN Health Coach will send the patient educational information on Gout. Will schedule the patient back with Johny Shock RN.  Lazaro Arms RN, BSN, [place] Direct Dial:  [phone]  Fax: [phone]

## 2018-12-01 ENCOUNTER — Ambulatory Visit (HOSPITAL_COMMUNITY)
Admission: RE | Admit: 2018-12-01 | Discharge: 2018-12-01 | Disposition: A | Discharge status: Home / Self Care | Payer: Medicare Other | Source: Ambulatory Visit | Attending: Nephrology | Admitting: Nephrology

## 2018-12-01 VITALS — BP 139/80 | HR 94 | Temp 98.2°F | Resp 20

## 2018-12-01 DIAGNOSIS — D631 Anemia in chronic kidney disease: Secondary | ICD-10-CM | POA: Insufficient documentation

## 2018-12-01 DIAGNOSIS — N184 Chronic kidney disease, stage 4 (severe): Secondary | ICD-10-CM | POA: Diagnosis not present

## 2018-12-01 LAB — CBC WITH DIFFERENTIAL/PLATELET
ABS IMMATURE GRANULOCYTES: 0.07 10*3/uL (ref 0.00–0.07)
BASOS PCT: 0 %
Basophils Absolute: 0 10*3/uL (ref 0.0–0.1)
EOS ABS: 0.1 10*3/uL (ref 0.0–0.5)
Eosinophils Relative: 1 %
HCT: 40.9 % (ref 36.0–46.0)
Hemoglobin: 10.3 g/dL — ABNORMAL LOW (ref 12.0–15.0)
Immature Granulocytes: 1 %
Lymphocytes Relative: 16 %
Lymphs Abs: 1.6 10*3/uL (ref 0.7–4.0)
MCH: 25.2 pg — AB (ref 26.0–34.0)
MCHC: 25.2 g/dL — AB (ref 30.0–36.0)
MCV: 100 fL (ref 80.0–100.0)
MONO ABS: 0.8 10*3/uL (ref 0.1–1.0)
Monocytes Relative: 8 %
NEUTROS ABS: 7.7 10*3/uL (ref 1.7–7.7)
Neutrophils Relative %: 74 %
PLATELETS: 130 10*3/uL — AB (ref 150–400)
RBC: 4.09 MIL/uL (ref 3.87–5.11)
RDW: 20.9 % — AB (ref 11.5–15.5)
WBC: 10.3 10*3/uL (ref 4.0–10.5)
nRBC: 0 % (ref 0.0–0.2)

## 2018-12-01 LAB — IRON AND TIBC
Iron: 81 ug/dL (ref 28–170)
Saturation Ratios: 30 % (ref 10.4–31.8)
TIBC: 267 ug/dL (ref 250–450)
UIBC: 186 ug/dL

## 2018-12-01 LAB — FERRITIN: FERRITIN: 170 ng/mL (ref 11–307)

## 2018-12-01 MED ORDER — DARBEPOETIN ALFA 60 MCG/0.3ML IJ SOSY
60.0000 ug | PREFILLED_SYRINGE | INTRAMUSCULAR | Status: DC
  Administered 2018-12-01: 60 ug via SUBCUTANEOUS

## 2018-12-01 MED ORDER — DARBEPOETIN ALFA 60 MCG/0.3ML IJ SOSY
PREFILLED_SYRINGE | INTRAMUSCULAR | Status: AC
  Filled 2018-12-01: qty 0.3

## 2018-12-02 ENCOUNTER — Encounter: Payer: Self-pay | Admitting: Vascular Surgery

## 2018-12-02 ENCOUNTER — Other Ambulatory Visit: Payer: Self-pay

## 2018-12-02 ENCOUNTER — Ambulatory Visit (HOSPITAL_COMMUNITY)
Admission: RE | Admit: 2018-12-02 | Discharge: 2018-12-02 | Disposition: A | Discharge status: Home / Self Care | Payer: Medicare Other | Source: Ambulatory Visit | Attending: Family Medicine | Admitting: Family Medicine

## 2018-12-02 ENCOUNTER — Other Ambulatory Visit: Payer: Self-pay | Admitting: Vascular Surgery

## 2018-12-02 ENCOUNTER — Ambulatory Visit (INDEPENDENT_AMBULATORY_CARE_PROVIDER_SITE_OTHER): Payer: Medicare Other | Admitting: Vascular Surgery

## 2018-12-02 VITALS — BP 134/82 | HR 100 | Temp 98.8°F | Resp 20 | Ht 62.5 in | Wt 248.0 lb

## 2018-12-02 DIAGNOSIS — T82510A Breakdown (mechanical) of surgically created arteriovenous fistula, initial encounter: Secondary | ICD-10-CM | POA: Insufficient documentation

## 2018-12-02 DIAGNOSIS — N184 Chronic kidney disease, stage 4 (severe): Secondary | ICD-10-CM

## 2018-12-02 LAB — PTH, INTACT AND CALCIUM
Calcium, Total (PTH): 8.5 mg/dL — ABNORMAL LOW (ref 8.7–10.3)
PTH: 50 pg/mL (ref 15–65)

## 2018-12-02 NOTE — Progress Notes (Signed)
Established Dialysis Access   History of Present Illness   Melissa Bartlett is a 72 y.o. (06/06/1946) female who presents for re-evaluation of permanent access.  She is status post right brachiocephalic fistula creation by Dr. Oneida Alar on 07/14/2018.  She is CKD stage IV under the care of Dr. Vanita Panda not yet on hemodialysis.  Patient was made aware at last office visit that she may require super visualization versus translocation due to the depth of her cephalic vein fistula.  She was referred back to our office by her nephrologist who agrees with the need for a super fistulization versus translocation.  Patient is now willing to proceed with the surgery.  She is following up with her nephrologist every 3 months with no plans for hemodialysis initiation in the near future.  She denies any recent shortness of breath, or chest pain.  The patient's PMH, PSH, SH, and FamHx were reviewed and are unchanged from prior visit.  Current Outpatient Medications  Medication Sig Dispense Refill  . atorvastatin (LIPITOR) 20 MG tablet Take 1 tablet (20 mg total) by mouth daily. 90 tablet 3  . brimonidine-timolol (COMBIGAN) 0.2-0.5 % ophthalmic solution Place 1 drop into both eyes 2 (two) times daily.    . Calcium Carbonate-Vitamin D3 (CALCIUM 600/VITAMIN D) 600-400 MG-UNIT TABS Take 1 tablet by mouth 2 (two) times daily.    . carvedilol (COREG) 25 MG tablet TAKE 1 TABLET BY MOUTH TWICE A DAY WITH A MEAL 180 tablet 2  . Darbepoetin Alfa (ARANESP) 60 MCG/0.3ML SOSY injection Inject 60 mcg every 30 (thirty) days into the skin.    Marland Kitchen diltiazem (CARDIZEM CD) 120 MG 24 hr capsule Take 1 capsule (120 mg total) by mouth daily. 90 capsule 3  . diphenhydramine-acetaminophen (TYLENOL PM) 25-500 MG TABS tablet Take 1 tablet by mouth at bedtime.     . furosemide (LASIX) 20 MG tablet Take 80 mg by mouth 2 (two) times daily.     . hydrALAZINE (APRESOLINE) 50 MG tablet Take 50 mg by mouth 3 (three) times daily.     Marland Kitchen  leflunomide (ARAVA) 10 MG tablet Take 10 mg by mouth daily.     . Magnesium Oxide 400 (240 Mg) MG TABS Take 400 mg by mouth 2 (two) times daily.  6  . pantoprazole (PROTONIX) 40 MG tablet Take 1 tablet (40 mg total) by mouth daily. 30 tablet 0  . potassium chloride SA (KLOR-CON M20) 20 MEQ tablet Take 1 tablet (20 mEq total) by mouth daily. 90 tablet 3  . predniSONE (DELTASONE) 5 MG tablet Take 5 mg by mouth daily.     Marland Kitchen PRESCRIPTION MEDICATION Inhale into the lungs at bedtime. CPAP    . Probiotic Product (ALIGN PO) Take 1 tablet by mouth at bedtime.     . Rivaroxaban (XARELTO) 15 MG TABS tablet Take 1 tablet (15 mg total) daily with supper by mouth. 30 tablet 8  . vitamin B-12 (CYANOCOBALAMIN) 1000 MCG tablet Take 1,000 mcg by mouth daily.    . FEBUXOSTAT PO Take by mouth.     No current facility-administered medications for this visit.     On ROS today: 10 system ROS is negative unless otherwise noted in HPI   Physical Examination   Vitals:   12/02/18 1509  BP: 134/82  Pulse: 100  Resp: 20  Temp: 98.8 F (37.1 C)  TempSrc: Oral  SpO2: 93%  Weight: 248 lb (112.5 kg)  Height: 5' 2.5" (1.588 m)   Body mass  index is 44.64 kg/m.  General Alert, O x 3, WD, NAD  Pulmonary Sym exp, good B air movt, CTA B  Cardiac RRR, Nl S1, S2  Vascular Vessel Right Left  Radial Palpable Palpable  Brachial Palpable Palpable  Ulnar Not palpable Not palpable    Musculo- skeletal  easily palpable thrill up to the level of the mid right upper arm; audible bruit throughout upper arm; strength and sensation intact right hand  Neurologic A&O; CN grossly intact     Non-invasive Vascular Imaging   right Arm Access Duplex     Diameters:  5-9 mm  Depth:  4-18 mm   Medical Decision Making   MALEKA CONTINO is a 72 y.o. female who presents with chronic kidney disease stage IV not yet on hemodialysis.    Based on duplex and physical exam, we will now proceed with superficialization versus  translocation of right arm brachiocephalic fistula Risk, benefits, and alternatives to access surgery were discussed.   The patient is aware the risks include but are not limited to: bleeding, infection, steal syndrome, nerve damage, thrombosis, and need for additional procedures.  The patient agrees to proceed forward with the procedure.  This will be scheduled in the next few weeks with Dr. Terri Skains PA-C [facility] Office: [phone]

## 2018-12-03 ENCOUNTER — Encounter: Payer: Self-pay | Admitting: Vascular Surgery

## 2018-12-04 ENCOUNTER — Other Ambulatory Visit: Payer: Self-pay | Admitting: Cardiology

## 2018-12-04 DIAGNOSIS — I480 Paroxysmal atrial fibrillation: Secondary | ICD-10-CM

## 2018-12-20 DIAGNOSIS — I4891 Unspecified atrial fibrillation: Secondary | ICD-10-CM | POA: Diagnosis not present

## 2018-12-20 DIAGNOSIS — I110 Hypertensive heart disease with heart failure: Secondary | ICD-10-CM | POA: Diagnosis not present

## 2018-12-20 DIAGNOSIS — I5030 Unspecified diastolic (congestive) heart failure: Secondary | ICD-10-CM | POA: Diagnosis not present

## 2018-12-20 DIAGNOSIS — M069 Rheumatoid arthritis, unspecified: Secondary | ICD-10-CM | POA: Diagnosis not present

## 2018-12-20 DIAGNOSIS — J441 Chronic obstructive pulmonary disease with (acute) exacerbation: Secondary | ICD-10-CM | POA: Diagnosis not present

## 2018-12-30 ENCOUNTER — Encounter (HOSPITAL_COMMUNITY): Payer: Self-pay | Admitting: *Deleted

## 2018-12-30 ENCOUNTER — Ambulatory Visit (HOSPITAL_COMMUNITY)
Admission: RE | Admit: 2018-12-30 | Discharge: 2018-12-30 | Disposition: A | Discharge status: Home / Self Care | Payer: Medicare Other | Source: Ambulatory Visit | Attending: Nephrology | Admitting: Nephrology

## 2018-12-30 ENCOUNTER — Other Ambulatory Visit: Payer: Self-pay

## 2018-12-30 VITALS — BP 157/80 | HR 82 | Temp 98.4°F | Resp 20

## 2018-12-30 DIAGNOSIS — N184 Chronic kidney disease, stage 4 (severe): Secondary | ICD-10-CM

## 2018-12-30 LAB — CBC WITH DIFFERENTIAL/PLATELET
Abs Immature Granulocytes: 0.12 10*3/uL — ABNORMAL HIGH (ref 0.00–0.07)
BASOS ABS: 0 10*3/uL (ref 0.0–0.1)
Basophils Relative: 0 %
Eosinophils Absolute: 0.1 10*3/uL (ref 0.0–0.5)
Eosinophils Relative: 1 %
HCT: 38.2 % (ref 36.0–46.0)
Hemoglobin: 10.9 g/dL — ABNORMAL LOW (ref 12.0–15.0)
Immature Granulocytes: 1 %
Lymphocytes Relative: 9 %
Lymphs Abs: 1.1 10*3/uL (ref 0.7–4.0)
MCH: 27.3 pg (ref 26.0–34.0)
MCHC: 28.5 g/dL — ABNORMAL LOW (ref 30.0–36.0)
MCV: 95.7 fL (ref 80.0–100.0)
Monocytes Absolute: 0.8 10*3/uL (ref 0.1–1.0)
Monocytes Relative: 7 %
Neutro Abs: 9.8 10*3/uL — ABNORMAL HIGH (ref 1.7–7.7)
Neutrophils Relative %: 82 %
Platelets: 207 10*3/uL (ref 150–400)
RBC: 3.99 MIL/uL (ref 3.87–5.11)
RDW: 18.6 % — ABNORMAL HIGH (ref 11.5–15.5)
WBC: 11.9 10*3/uL — AB (ref 4.0–10.5)
nRBC: 0 % (ref 0.0–0.2)

## 2018-12-30 LAB — POCT HEMOGLOBIN-HEMACUE: Hemoglobin: 10.8 g/dL — ABNORMAL LOW (ref 12.0–15.0)

## 2018-12-30 MED ORDER — DARBEPOETIN ALFA 60 MCG/0.3ML IJ SOSY
60.0000 ug | PREFILLED_SYRINGE | INTRAMUSCULAR | Status: DC
  Administered 2018-12-30: 60 ug via SUBCUTANEOUS

## 2018-12-30 MED ORDER — DARBEPOETIN ALFA 60 MCG/0.3ML IJ SOSY
PREFILLED_SYRINGE | INTRAMUSCULAR | Status: AC
  Filled 2018-12-30: qty 0.3

## 2018-12-30 NOTE — Progress Notes (Signed)
Spoke with pt for pre-op call. Pt has hx of A-fib and sees Dr. Gillian Shields, last office visit was 11/15/18. Pt is on Xarelto and has been instructed to stop 2 days prior to surgery. Last dose will be 12/31/2018. Pt is a type 2 diabetic. Pt is not on any medications for diabetes. Last A1C was 5.9 on 05/16/18. She states she hasn't checked her blood sugar in several weeks. She states she does have a meter to check it. I instructed her to check it the morning of surgery when she gets up.If blood sugar is 70 or below, treat with 1/2 cup of clear juice (apple or cranberry) and recheck blood sugar 15 minutes after drinking juice. Pt voiced understanding.

## 2018-12-31 ENCOUNTER — Telehealth: Payer: Self-pay | Admitting: *Deleted

## 2018-12-31 NOTE — Telephone Encounter (Signed)
Call to patient with time change. Instructed to be at [facility] admitting office at 7 am on 01/03/2019. All other pre-op instructions unchanged. Verbalized understanding.

## 2019-01-03 ENCOUNTER — Other Ambulatory Visit: Payer: Self-pay

## 2019-01-03 ENCOUNTER — Telehealth: Payer: Self-pay | Admitting: Vascular Surgery

## 2019-01-03 ENCOUNTER — Ambulatory Visit (HOSPITAL_COMMUNITY)
Admission: RE | Admit: 2019-01-03 | Discharge: 2019-01-03 | Disposition: A | Discharge status: Home / Self Care | Payer: Medicare Other | Attending: Vascular Surgery | Admitting: Vascular Surgery

## 2019-01-03 ENCOUNTER — Encounter (HOSPITAL_COMMUNITY): Payer: Self-pay

## 2019-01-03 ENCOUNTER — Encounter (HOSPITAL_COMMUNITY)
Admission: RE | Disposition: A | Discharge status: Home / Self Care | Payer: Self-pay | Source: Home / Self Care | Attending: Vascular Surgery

## 2019-01-03 ENCOUNTER — Inpatient Hospital Stay (HOSPITAL_COMMUNITY): Payer: Medicare Other | Admitting: Registered Nurse

## 2019-01-03 DIAGNOSIS — Z7901 Long term (current) use of anticoagulants: Secondary | ICD-10-CM | POA: Diagnosis not present

## 2019-01-03 DIAGNOSIS — Y832 Surgical operation with anastomosis, bypass or graft as the cause of abnormal reaction of the patient, or of later complication, without mention of misadventure at the time of the procedure: Secondary | ICD-10-CM | POA: Insufficient documentation

## 2019-01-03 DIAGNOSIS — Z6841 Body Mass Index (BMI) 40.0 and over, adult: Secondary | ICD-10-CM | POA: Insufficient documentation

## 2019-01-03 DIAGNOSIS — I509 Heart failure, unspecified: Secondary | ICD-10-CM | POA: Insufficient documentation

## 2019-01-03 DIAGNOSIS — N184 Chronic kidney disease, stage 4 (severe): Secondary | ICD-10-CM | POA: Diagnosis not present

## 2019-01-03 DIAGNOSIS — K219 Gastro-esophageal reflux disease without esophagitis: Secondary | ICD-10-CM | POA: Diagnosis not present

## 2019-01-03 DIAGNOSIS — Z7952 Long term (current) use of systemic steroids: Secondary | ICD-10-CM | POA: Diagnosis not present

## 2019-01-03 DIAGNOSIS — Z87891 Personal history of nicotine dependence: Secondary | ICD-10-CM | POA: Diagnosis not present

## 2019-01-03 DIAGNOSIS — E1122 Type 2 diabetes mellitus with diabetic chronic kidney disease: Secondary | ICD-10-CM | POA: Diagnosis not present

## 2019-01-03 DIAGNOSIS — T82898A Other specified complication of vascular prosthetic devices, implants and grafts, initial encounter: Secondary | ICD-10-CM | POA: Diagnosis not present

## 2019-01-03 DIAGNOSIS — I130 Hypertensive heart and chronic kidney disease with heart failure and stage 1 through stage 4 chronic kidney disease, or unspecified chronic kidney disease: Secondary | ICD-10-CM | POA: Insufficient documentation

## 2019-01-03 DIAGNOSIS — R011 Cardiac murmur, unspecified: Secondary | ICD-10-CM | POA: Insufficient documentation

## 2019-01-03 DIAGNOSIS — G4733 Obstructive sleep apnea (adult) (pediatric): Secondary | ICD-10-CM | POA: Diagnosis not present

## 2019-01-03 DIAGNOSIS — M069 Rheumatoid arthritis, unspecified: Secondary | ICD-10-CM | POA: Insufficient documentation

## 2019-01-03 DIAGNOSIS — I5031 Acute diastolic (congestive) heart failure: Secondary | ICD-10-CM | POA: Diagnosis not present

## 2019-01-03 HISTORY — PX: FISTULA SUPERFICIALIZATION: SHX6341

## 2019-01-03 LAB — GLUCOSE, CAPILLARY
GLUCOSE-CAPILLARY: 172 mg/dL — AB (ref 70–99)
Glucose-Capillary: 140 mg/dL — ABNORMAL HIGH (ref 70–99)

## 2019-01-03 LAB — POCT I-STAT 4, (NA,K, GLUC, HGB,HCT)
GLUCOSE: 129 mg/dL — AB (ref 70–99)
HEMATOCRIT: 34 % — AB (ref 36.0–46.0)
Hemoglobin: 11.6 g/dL — ABNORMAL LOW (ref 12.0–15.0)
Potassium: 4.4 mmol/L (ref 3.5–5.1)
Sodium: 140 mmol/L (ref 135–145)

## 2019-01-03 LAB — PROTIME-INR
INR: 1.17
Prothrombin Time: 14.8 seconds (ref 11.4–15.2)

## 2019-01-03 SURGERY — FISTULA SUPERFICIALIZATION
Anesthesia: Monitor Anesthesia Care | Site: Arm Upper | Laterality: Right

## 2019-01-03 MED ORDER — DEXAMETHASONE SODIUM PHOSPHATE 10 MG/ML IJ SOLN
INTRAMUSCULAR | Status: DC | PRN
  Administered 2019-01-03: 10 mg via INTRAVENOUS

## 2019-01-03 MED ORDER — SODIUM CHLORIDE 0.9 % IV SOLN: INTRAVENOUS | Status: DC

## 2019-01-03 MED ORDER — PROPOFOL 10 MG/ML IV BOLUS
INTRAVENOUS | Status: AC
  Filled 2019-01-03: qty 20

## 2019-01-03 MED ORDER — LIDOCAINE HCL (PF) 1 % IJ SOLN
INTRAMUSCULAR | Status: AC
  Filled 2019-01-03: qty 30

## 2019-01-03 MED ORDER — LIDOCAINE HCL (CARDIAC) PF 100 MG/5ML IV SOSY
PREFILLED_SYRINGE | INTRAVENOUS | Status: DC | PRN
  Administered 2019-01-03: 80 mg via INTRAVENOUS

## 2019-01-03 MED ORDER — PHENYLEPHRINE 40 MCG/ML (10ML) SYRINGE FOR IV PUSH (FOR BLOOD PRESSURE SUPPORT)
PREFILLED_SYRINGE | INTRAVENOUS | Status: DC | PRN
  Administered 2019-01-03 (×3): 80 ug via INTRAVENOUS

## 2019-01-03 MED ORDER — SODIUM CHLORIDE 0.9 % IV SOLN
INTRAVENOUS | Status: DC | PRN
  Administered 2019-01-03: 10:00:00

## 2019-01-03 MED ORDER — FENTANYL CITRATE (PF) 250 MCG/5ML IJ SOLN
INTRAMUSCULAR | Status: AC
  Filled 2019-01-03: qty 5

## 2019-01-03 MED ORDER — ONDANSETRON HCL 4 MG/2ML IJ SOLN
INTRAMUSCULAR | Status: AC
  Filled 2019-01-03: qty 2

## 2019-01-03 MED ORDER — PROPOFOL 10 MG/ML IV BOLUS
INTRAVENOUS | Status: DC | PRN
  Administered 2019-01-03: 160 mg via INTRAVENOUS

## 2019-01-03 MED ORDER — OXYCODONE-ACETAMINOPHEN 5-325 MG PO TABS: 1.0000 | ORAL_TABLET | Freq: Four times a day (QID) | ORAL | 0 refills | Status: DC | PRN

## 2019-01-03 MED ORDER — ONDANSETRON HCL 4 MG/2ML IJ SOLN
INTRAMUSCULAR | Status: DC | PRN
  Administered 2019-01-03: 4 mg via INTRAVENOUS

## 2019-01-03 MED ORDER — DEXAMETHASONE SODIUM PHOSPHATE 10 MG/ML IJ SOLN
INTRAMUSCULAR | Status: AC
  Filled 2019-01-03: qty 1

## 2019-01-03 MED ORDER — LIDOCAINE 2% (20 MG/ML) 5 ML SYRINGE
INTRAMUSCULAR | Status: AC
  Filled 2019-01-03: qty 5

## 2019-01-03 MED ORDER — 0.9 % SODIUM CHLORIDE (POUR BTL) OPTIME
TOPICAL | Status: DC | PRN
  Administered 2019-01-03: 1000 mL

## 2019-01-03 MED ORDER — SODIUM CHLORIDE 0.9 % IV SOLN
INTRAVENOUS | Status: AC
  Filled 2019-01-03: qty 1.2

## 2019-01-03 MED ORDER — PHENYLEPHRINE 40 MCG/ML (10ML) SYRINGE FOR IV PUSH (FOR BLOOD PRESSURE SUPPORT)
PREFILLED_SYRINGE | INTRAVENOUS | Status: AC
  Filled 2019-01-03: qty 10

## 2019-01-03 MED ORDER — FENTANYL CITRATE (PF) 250 MCG/5ML IJ SOLN
INTRAMUSCULAR | Status: DC | PRN
  Administered 2019-01-03 (×2): 50 ug via INTRAVENOUS

## 2019-01-03 MED ORDER — SODIUM CHLORIDE 0.9 % IV SOLN
INTRAVENOUS | Status: DC
  Administered 2019-01-03 (×2): via INTRAVENOUS

## 2019-01-03 MED ORDER — CEFAZOLIN SODIUM-DEXTROSE 2-4 GM/100ML-% IV SOLN
INTRAVENOUS | Status: AC
  Filled 2019-01-03: qty 100

## 2019-01-03 MED ORDER — CEFAZOLIN SODIUM-DEXTROSE 2-4 GM/100ML-% IV SOLN
2.0000 g | INTRAVENOUS | Status: AC
  Administered 2019-01-03: 2 g via INTRAVENOUS

## 2019-01-03 SURGICAL SUPPLY — 32 items
ADH SKN CLS APL DERMABOND .7 (GAUZE/BANDAGES/DRESSINGS) ×1
AGENT HMST SPONGE THK3/8 (HEMOSTASIS)
ARMBAND PINK RESTRICT EXTREMIT (MISCELLANEOUS) ×2 IMPLANT
CANISTER SUCT 3000ML PPV (MISCELLANEOUS) ×2 IMPLANT
CLIP VESOCCLUDE MED 6/CT (CLIP) ×2 IMPLANT
CLIP VESOCCLUDE SM WIDE 6/CT (CLIP) ×2 IMPLANT
COVER PROBE W GEL 5X96 (DRAPES) ×1 IMPLANT
COVER WAND RF STERILE (DRAPES) ×2 IMPLANT
DERMABOND ADVANCED (GAUZE/BANDAGES/DRESSINGS) ×1
DERMABOND ADVANCED .7 DNX12 (GAUZE/BANDAGES/DRESSINGS) ×1 IMPLANT
DRAIN PENROSE 1/4X12 LTX STRL (WOUND CARE) IMPLANT
ELECT REM PT RETURN 9FT ADLT (ELECTROSURGICAL) ×2
ELECTRODE REM PT RTRN 9FT ADLT (ELECTROSURGICAL) ×1 IMPLANT
GLOVE BIO SURGEON STRL SZ7.5 (GLOVE) ×2 IMPLANT
GOWN STRL REUS W/ TWL LRG LVL3 (GOWN DISPOSABLE) ×3 IMPLANT
GOWN STRL REUS W/TWL LRG LVL3 (GOWN DISPOSABLE) ×6
HEMOSTAT SPONGE AVITENE ULTRA (HEMOSTASIS) IMPLANT
KIT BASIN OR (CUSTOM PROCEDURE TRAY) ×2 IMPLANT
KIT TURNOVER KIT B (KITS) ×2 IMPLANT
LOOP VESSEL MINI RED (MISCELLANEOUS) IMPLANT
NS IRRIG 1000ML POUR BTL (IV SOLUTION) ×2 IMPLANT
PACK CV ACCESS (CUSTOM PROCEDURE TRAY) ×2 IMPLANT
PAD ARMBOARD 7.5X6 YLW CONV (MISCELLANEOUS) ×4 IMPLANT
SUT PROLENE 7 0 BV 1 (SUTURE) ×2 IMPLANT
SUT SILK 2 0 SH (SUTURE) ×1 IMPLANT
SUT VIC AB 3-0 SH 27 (SUTURE) ×2
SUT VIC AB 3-0 SH 27X BRD (SUTURE) ×1 IMPLANT
SUT VIC AB 4-0 PS2 18 (SUTURE) ×2 IMPLANT
SUT VICRYL 4-0 PS2 18IN ABS (SUTURE) ×2 IMPLANT
TOWEL GREEN STERILE (TOWEL DISPOSABLE) ×2 IMPLANT
UNDERPAD 30X30 (UNDERPADS AND DIAPERS) ×2 IMPLANT
WATER STERILE IRR 1000ML POUR (IV SOLUTION) ×2 IMPLANT

## 2019-01-03 NOTE — Telephone Encounter (Signed)
-----   Message from Ulyses Amor, [place] sent at 01/03/2019 11:28 AM EST -----  S/P right UE AV fistula transposition f/u with [facility] clinic in 2-3 weeks for incisional ck.  On a Thursday

## 2019-01-03 NOTE — Anesthesia Procedure Notes (Signed)
Procedure Name: LMA Insertion Date/Time: 01/03/2019 10:21 AM Performed by: Teressa Lower., CRNA Pre-anesthesia Checklist: Patient identified, Emergency Drugs available, Suction available and Patient being monitored Patient Re-evaluated:Patient Re-evaluated prior to induction Oxygen Delivery Method: Circle system utilized Preoxygenation: Pre-oxygenation with 100% oxygen Induction Type: IV induction LMA: LMA with gastric port inserted LMA Size: 5.0 Number of attempts: 2 Placement Confirmation: breath sounds checked- equal and bilateral and positive ETCO2 Tube secured with: Tape Dental Injury: Teeth and Oropharynx as per pre-operative assessment  Comments: LMA 4 placed, inadequate TV, switched out to LMA 5, tv 300+, BBS, +etco2.

## 2019-01-03 NOTE — Progress Notes (Signed)
Dr. Oneida Alar at bedside to evaluate patient for discharge. Per Dr. Oneida Alar, [place] to go home.

## 2019-01-03 NOTE — Anesthesia Preprocedure Evaluation (Addendum)
Anesthesia Evaluation  Patient identified by MRN, date of birth, ID band Patient awake    Reviewed: Allergy & Precautions, NPO status , Patient's Chart, lab work & pertinent test results  Airway Mallampati: II  TM Distance: >3 FB     Dental   Pulmonary sleep apnea , pneumonia, former smoker,    breath sounds clear to auscultation       Cardiovascular hypertension, +CHF   Rhythm:Regular Rate:Normal     Neuro/Psych    GI/Hepatic Neg liver ROS, GERD  ,  Endo/Other  diabetes  Renal/GU Renal disease     Musculoskeletal   Abdominal   Peds  Hematology   Anesthesia Other Findings   Reproductive/Obstetrics                             Anesthesia Physical Anesthesia Plan  ASA: IV  Anesthesia Plan: MAC and General   Post-op Pain Management:    Induction: Intravenous  PONV Risk Score and Plan: Treatment may vary due to age or medical condition  Airway Management Planned: LMA  Additional Equipment:   Intra-op Plan:   Post-operative Plan:   Informed Consent: I have reviewed the patients History and Physical, chart, labs and discussed the procedure including the risks, benefits and alternatives for the proposed anesthesia with the patient or authorized representative who has indicated his/her understanding and acceptance.   Dental advisory given  Plan Discussed with: Anesthesiologist and CRNA  Anesthesia Plan Comments:        Anesthesia Quick Evaluation

## 2019-01-03 NOTE — Anesthesia Postprocedure Evaluation (Signed)
Anesthesia Post Note  Patient: Melissa Bartlett  Procedure(s) Performed: SUPERFICIALIZATION OF RIGHT ARM FISTULA (Right Arm Upper)     Patient location during evaluation: PACU Anesthesia Type: MAC Pain management: pain level controlled Vital Signs Assessment: post-procedure vital signs reviewed and stable Respiratory status: spontaneous breathing Cardiovascular status: stable Postop Assessment: no apparent nausea or vomiting Anesthetic complications: no    Last Vitals:  Vitals:   01/03/19 1550 01/03/19 1605  BP: (!) 160/82   Pulse: 72 83  Resp: 19 15  Temp:  (!) 36.4 C  SpO2: 98% 96%    Last Pain:  Vitals:   01/03/19 1605  TempSrc:   PainSc: 0-No pain                 January Bergthold

## 2019-01-03 NOTE — Op Note (Signed)
Procedure: #1 Superficialization right brachiocephalic AV fistula  #2 ligation of multiple side branches  Preoperative Diagnosis: Non-maturing right brachiocephalic AV fistula  Postoperative diagnosis: Same  Anesthesia: General  Asst: Gerri Lins PA-C  Operative details: After obtaining informed consent, the patient was taken to the operating room. The patient was placed in supine position on the operating room table. After induction of general anesthesia and placement of a laryngeal mask the patient's entire right upper extremity was prepped and draped in the usual sterile fashion. A longitudinal incision was made near the right antecubital crease. The incision was carried into the subcutaneous tissues down to level of the right arm AV fistula. The cephalic vein was dissected free circumferentially.  I then proceeded to dissect out the entire cephalic vein in the upper arm through several skip incisions of the arm. There were 4 discrete side branches all of which were 1-1/2 mm in diameter and these were all ligated and divided between silk ties. The vein was fully mobilized circumferentially so it could rise to the skin surface.I then debrided a large amount of fat from the subcutaneous tissues of each incision and under the skin bridges.  Hemostasis was obtained. I placed several tacking  3 0 vicryl stitches posterior to the fistula to encourage it to rise to the surface.  The arm incisions were closed with running 4-0 Vicryl subcuticular stitch.  Dermabond was applied to all incisions. Patient tolerated the procedure well and there were no complications. Instrument, sponge, and  needle counts were correct at the end of the case. There was a palpable thrill in the fistula at the end of the case.  Ruta Hinds, MD [facility] Office: [phone] Pager: [phone]

## 2019-01-03 NOTE — Discharge Instructions (Addendum)
° °  [facility] ° °Discharge Instructions ° °AV Fistula or Graft Surgery for Dialysis Access ° °Please refer to the following instructions for your post-procedure care. Your surgeon or physician assistant will discuss any changes with you. ° °Activity ° °You may drive the day following your surgery, if you are comfortable and no longer taking prescription pain medication. Resume full activity as the soreness in your incision resolves. ° °Bathing/Showering ° °You may shower after you go home. Keep your incision dry for 48 hours. Do not soak in a bathtub, hot tub, or swim until the incision heals completely. You may not shower if you have a hemodialysis catheter. ° °Incision Care ° °Clean your incision with mild soap and water after 48 hours. Pat the area dry with a clean towel. You do not need a bandage unless otherwise instructed. Do not apply any ointments or creams to your incision. You may have skin glue on your incision. Do not peel it off. It will come off on its own in about one week. Your arm may swell a bit after surgery. To reduce swelling use pillows to elevate your arm so it is above your heart. Your doctor will tell you if you need to lightly wrap your arm with an ACE bandage. ° °Diet ° °Resume your normal diet. There are not special food restrictions following this procedure. In order to heal from your surgery, it is CRITICAL to get adequate nutrition. Your body requires vitamins, minerals, and protein. Vegetables are the best source of vitamins and minerals. Vegetables also provide the perfect balance of protein. Processed food has little nutritional value, so try to avoid this. ° °Medications ° °Resume taking all of your medications. If your incision is causing pain, you may take over-the counter pain relievers such as acetaminophen (Tylenol). If you were prescribed a stronger pain medication, please be aware these medications can cause nausea and constipation. Prevent  nausea by taking the medication with a snack or meal. Avoid constipation by drinking plenty of fluids and eating foods with high amount of fiber, such as fruits, vegetables, and grains. Do not take Tylenol if you are taking prescription pain medications. ° ° ° ° °Follow up °Your surgeon may want to see you in the office following your access surgery. If so, this will be arranged at the time of your surgery. ° °Please call us immediately for any of the following conditions: ° °Increased pain, redness, drainage (pus) from your incision site °Fever of 101 degrees or higher °Severe or worsening pain at your incision site °Hand pain or numbness. ° °Reduce your risk of vascular disease: ° °Stop smoking. If you would like help, call [facility] at 1-800-QUIT-NOW (1-800-784-8669) or [place] at 336-586-4000 ° °Manage your cholesterol °Maintain a desired weight °Control your diabetes °Keep your blood pressure down ° °Dialysis ° °It will take several weeks to several months for your new dialysis access to be ready for use. Your surgeon will determine when it is OK to use it. Your nephrologist will continue to direct your dialysis. You can continue to use your Permcath until your new access is ready for use. ° °If you have any questions, please call the office at [phone]. ° °

## 2019-01-03 NOTE — Telephone Encounter (Signed)
sch appt lm mld ltr 01/27/2019 2pm p/o PA

## 2019-01-03 NOTE — Transfer of Care (Signed)
Immediate Anesthesia Transfer of Care Note  Patient: Melissa Bartlett  Procedure(s) Performed: SUPERFICIALIZATION OF RIGHT ARM FISTULA (Right Arm Upper)  Patient Location: PACU  Anesthesia Type:General  Level of Consciousness: awake, alert  and oriented  Airway & Oxygen Therapy: Patient Spontanous Breathing and Patient connected to face mask oxygen  Post-op Assessment: Report given to RN and Post -op Vital signs reviewed and stable  Post vital signs: Reviewed and stable  Last Vitals:  Vitals Value Taken Time  BP 138/65 01/03/2019 11:34 AM  Temp    Pulse 75 01/03/2019 11:35 AM  Resp 19 01/03/2019 11:35 AM  SpO2 100 % 01/03/2019 11:35 AM  Vitals shown include unvalidated device data.  Last Pain:  Vitals:   01/03/19 0744  TempSrc:   PainSc: 0-No pain      Patients Stated Pain Goal: 4 (46/52/07 6191)  Complications: No apparent anesthesia complications

## 2019-01-03 NOTE — H&P (Signed)
Melissa Bartlett is a 73 y.o. (11/12/1946) female who presents for re-evaluation of permanent access.  She is status post right brachiocephalic fistula creation by Dr. Oneida Alar on 07/14/2018.  She is CKD stage IV under the care of Dr. Vanita Panda not yet on hemodialysis.  Patient was made aware at last office visit that she may require super visualization versus translocation due to the depth of her cephalic vein fistula.  She was referred back to our office by her nephrologist who agrees with the need for a super fistulization versus translocation.  Patient is now willing to proceed with the surgery.  She is following up with her nephrologist every 3 months with no plans for hemodialysis initiation in the near future.  She denies any recent shortness of breath, or chest pain.  The patient's PMH, PSH, SH, and FamHx were reviewed and are unchanged from prior visit.        Current Outpatient Medications  Medication Sig Dispense Refill  . atorvastatin (LIPITOR) 20 MG tablet Take 1 tablet (20 mg total) by mouth daily. 90 tablet 3  . brimonidine-timolol (COMBIGAN) 0.2-0.5 % ophthalmic solution Place 1 drop into both eyes 2 (two) times daily.    . Calcium Carbonate-Vitamin D3 (CALCIUM 600/VITAMIN D) 600-400 MG-UNIT TABS Take 1 tablet by mouth 2 (two) times daily.    . carvedilol (COREG) 25 MG tablet TAKE 1 TABLET BY MOUTH TWICE A DAY WITH A MEAL 180 tablet 2  . Darbepoetin Alfa (ARANESP) 60 MCG/0.3ML SOSY injection Inject 60 mcg every 30 (thirty) days into the skin.    Marland Kitchen diltiazem (CARDIZEM CD) 120 MG 24 hr capsule Take 1 capsule (120 mg total) by mouth daily. 90 capsule 3  . diphenhydramine-acetaminophen (TYLENOL PM) 25-500 MG TABS tablet Take 1 tablet by mouth at bedtime.     . furosemide (LASIX) 20 MG tablet Take 80 mg by mouth 2 (two) times daily.     . hydrALAZINE (APRESOLINE) 50 MG tablet Take 50 mg by mouth 3 (three) times daily.     Marland Kitchen leflunomide (ARAVA) 10 MG tablet Take 10 mg by mouth  daily.     . Magnesium Oxide 400 (240 Mg) MG TABS Take 400 mg by mouth 2 (two) times daily.  6  . pantoprazole (PROTONIX) 40 MG tablet Take 1 tablet (40 mg total) by mouth daily. 30 tablet 0  . potassium chloride SA (KLOR-CON M20) 20 MEQ tablet Take 1 tablet (20 mEq total) by mouth daily. 90 tablet 3  . predniSONE (DELTASONE) 5 MG tablet Take 5 mg by mouth daily.     Marland Kitchen PRESCRIPTION MEDICATION Inhale into the lungs at bedtime. CPAP    . Probiotic Product (ALIGN PO) Take 1 tablet by mouth at bedtime.     . Rivaroxaban (XARELTO) 15 MG TABS tablet Take 1 tablet (15 mg total) daily with supper by mouth. 30 tablet 8  . vitamin B-12 (CYANOCOBALAMIN) 1000 MCG tablet Take 1,000 mcg by mouth daily.    . FEBUXOSTAT PO Take by mouth.     No current facility-administered medications for this visit.     On ROS today: 10 system ROS is negative unless otherwise noted in HPI   Physical Examination      Vitals:   12/02/18 1509  BP: 134/82  Pulse: 100  Resp: 20  Temp: 98.8 F (37.1 C)  TempSrc: Oral  SpO2: 93%  Weight: 248 lb (112.5 kg)  Height: 5' 2.5" (1.588 m)   Body mass index is 44.64 kg/m.  General Alert, O x 3, WD, NAD  Pulmonary Sym exp, good B air movt, CTA B  Cardiac RRR, Nl S1, S2  Vascular Vessel Right Left  Radial Palpable Palpable  Brachial Palpable Palpable  Ulnar Not palpable Not palpable    Musculo- skeletal  easily palpable thrill up to the level of the mid right upper arm; audible bruit throughout upper arm; strength and sensation intact right hand  Neurologic A&O; CN grossly intact     Non-invasive Vascular Imaging   right Arm Access Duplex     Diameters:  5-9 mm  Depth:  4-18 mm   Medical Decision Making   Melissa Bartlett is a 73 y.o. female who presents with chronic kidney disease stage IV not yet on hemodialysis.    Based on duplex and physical exam, we will now proceed with superficialization versus translocation of  right arm brachiocephalic fistula  Risk, benefits, and alternatives to access surgery were discussed.    The patient is aware the risks include but are not limited to: bleeding, infection, steal syndrome, nerve damage, thrombosis, and need for additional procedures.   The patient agrees to proceed forward with the procedure.  This will be scheduled in the next few weeks with Dr. Mikeal Hawthorne, MD [facility] Office: [phone] Pager: [phone]

## 2019-01-04 ENCOUNTER — Encounter (HOSPITAL_COMMUNITY): Payer: Self-pay | Admitting: Vascular Surgery

## 2019-01-04 ENCOUNTER — Other Ambulatory Visit: Payer: Self-pay | Admitting: *Deleted

## 2019-01-04 NOTE — Discharge Summary (Signed)
[facility] Discharge Summary   Patient ID:  Melissa Bartlett MRN: [id number] DOB/AGE: [birth year] 73 y.o.  Admit date: 01/03/2019 Discharge date: 01/03/2019 Date of Surgery: 01/03/2019 Surgeon: Surgeon(s): Elam Dutch, MD  Admission Diagnosis: CHRONIC KIDNEY DISEASE  Discharge Diagnoses:  CHRONIC KIDNEY DISEASE  Secondary Diagnoses: Past Medical History:  Diagnosis Date  . Anemia   . CHF (congestive heart failure) ([place])   . CKD (chronic kidney disease), stage IV ([place])   . Complication of anesthesia    " I aspirated during my gallbladder surgery"  . Diabetes ([place])   . GERD (gastroesophageal reflux disease)   . Heart murmur   . HTN (hypertension)   . Morbid obesity with BMI of 45.0-49.9, adult ([place])   . OSA (obstructive sleep apnea)    with AHI 11/hr with nocturnal hypoxemia , uses cpap  . PAF (paroxysmal atrial fibrillation) ([place])    s/p DCCV in 8/17  . Pneumonia   . RA (rheumatoid arthritis) ([place])   . Wears glasses   . Wears partial dentures     Procedure(s): SUPERFICIALIZATION OF RIGHT ARM FISTULA  Discharged Condition: good  HPI: Melissa Bartlett a 73 y.o.(07/30/1946)femalewho presents for re-evaluationofpermanent access. She is status post right brachiocephalic fistula creation by Dr.Fields on 07/14/2018.  The fistula was too deep for access and had multiple competing branches.  He was scheduled for transposition/superficialization.   Hospital Course:  Melissa Bartlett is a 73 y.o. female is S/P  Procedure(s): SUPERFICIALIZATION OF RIGHT ARM FISTULA Post op he was in stable condition with good thrill in the fistula.  He was discharged home.   Significant Diagnostic Studies: CBC Lab Results  Component Value Date   WBC 11.9 (H) 12/30/2018   HGB 11.6 (L) 01/03/2019   HCT 34.0 (L) 01/03/2019   MCV 95.7 12/30/2018   PLT 207 12/30/2018    BMET    Component Value Date/Time   NA 140 01/03/2019 0732   NA 143 03/10/2018 1015    K 4.4 01/03/2019 0732   CL 104 09/24/2018 1542   CO2 27 09/24/2018 1542   GLUCOSE 129 (H) 01/03/2019 0732   BUN 49 (H) 09/24/2018 1542   BUN 42 (H) 03/10/2018 1015   CREATININE 3.65 (H) 09/24/2018 1542   CREATININE 2.22 (H) 08/01/2016 1345   CALCIUM 8.5 (L) 12/01/2018 1303   GFRNONAA 12 (L) 09/24/2018 1542   GFRAA 13 (L) 09/24/2018 1542   COAG Lab Results  Component Value Date   INR 1.17 01/03/2019   INR 1.06 07/14/2018   INR 1.13 05/14/2018     Disposition:  Discharge to :Home Discharge Instructions    Call MD for:  redness, tenderness, or signs of infection (pain, swelling, bleeding, redness, odor or green/yellow discharge around incision site)   Complete by:  As directed    Call MD for:  severe or increased pain, loss or decreased feeling  in affected limb(s)   Complete by:  As directed    Call MD for:  temperature >100.5   Complete by:  As directed    Resume previous diet   Complete by:  As directed      Allergies as of 01/03/2019      Reactions   Enalapril Other (See Comments)   Systemic reaction - jerking   Codeine Itching      Medication List    TAKE these medications   ALIGN PO Take 1 tablet by mouth at bedtime.   atorvastatin 20 MG tablet Commonly known  as:  LIPITOR Take 20 mg by mouth daily.   CALCIUM 600/VITAMIN D 600-400 MG-UNIT Tabs Generic drug:  Calcium Carbonate-Vitamin D3 Take 1 tablet by mouth 2 (two) times daily.   carvedilol 25 MG tablet Commonly known as:  COREG TAKE 1 TABLET BY MOUTH TWICE A DAY WITH A MEAL What changed:  See the new instructions.   COMBIGAN 0.2-0.5 % ophthalmic solution Generic drug:  brimonidine-timolol Place 1 drop into both eyes 2 (two) times daily.   Darbepoetin Alfa 60 MCG/0.3ML Sosy injection Commonly known as:  ARANESP Inject 60 mcg every 30 (thirty) days into the skin.   diltiazem 120 MG 24 hr capsule Commonly known as:  CARDIZEM CD Take 120 mg by mouth daily.   diphenhydramine-acetaminophen  25-500 MG Tabs tablet Commonly known as:  TYLENOL PM Take 1 tablet by mouth at bedtime.   FEBUXOSTAT PO Take by mouth.   furosemide 20 MG tablet Commonly known as:  LASIX Take 80 mg by mouth 2 (two) times daily.   hydrALAZINE 50 MG tablet Commonly known as:  APRESOLINE Take 50 mg by mouth 3 (three) times daily.   leflunomide 10 MG tablet Commonly known as:  ARAVA Take 10 mg by mouth daily.   Magnesium Oxide 400 (240 Mg) MG Tabs Take 400 mg by mouth 2 (two) times daily.   oxyCODONE-acetaminophen 5-325 MG tablet Commonly known as:  PERCOCET/ROXICET Take 1 tablet by mouth every 6 (six) hours as needed.   pantoprazole 40 MG tablet Commonly known as:  PROTONIX Take 1 tablet (40 mg total) by mouth daily.   potassium chloride SA 20 MEQ tablet Commonly known as:  KLOR-CON M20 Take 1 tablet (20 mEq total) by mouth daily.   predniSONE 5 MG tablet Commonly known as:  DELTASONE Take 5 mg by mouth daily.   PRESCRIPTION MEDICATION Inhale into the lungs at bedtime. CPAP   vitamin B-12 1000 MCG tablet Commonly known as:  CYANOCOBALAMIN Take 1,000 mcg by mouth daily.   XARELTO 15 MG Tabs tablet Generic drug:  Rivaroxaban TAKE 1 TABLET DAILY WITH SUPPER BY MOUTH. What changed:  See the new instructions.      Verbal and written Discharge instructions given to the patient. Wound care per Discharge AVS Follow-up Information    Elam Dutch, MD In 2 weeks.   Specialties:  Vascular Surgery, Cardiology Why:  Office will call you to arrange your appt (sent) Contact information: [address] [place] [address] [phone]           Signed: Roxy Horseman 01/04/2019, 8:59 AM

## 2019-01-04 NOTE — Patient Outreach (Signed)
[facility]) Care Management  01/04/2019  Melissa Bartlett [birth year] [id number]   RN Health Coach attempted#1 follow up outreach call to patient.  Patient was unavailable. Per family member patient is asleep. RN left message.  Plan: RN will call patient again within 14 days.  [facility] Care Management [phone]

## 2019-01-07 ENCOUNTER — Other Ambulatory Visit: Payer: Self-pay | Admitting: *Deleted

## 2019-01-12 ENCOUNTER — Telehealth: Payer: Self-pay | Admitting: Cardiology

## 2019-01-12 MED ORDER — WARFARIN SODIUM 5 MG PO TABS: 5.0000 mg | ORAL_TABLET | Freq: Every day | ORAL | 0 refills | Status: DC

## 2019-01-12 NOTE — Telephone Encounter (Signed)
New Message         Pt c/o medication issue:  1. Name of Medication: Xarelto   2. How are you currently taking this medication (dosage and times per day)? 1 x at night  3. Are you having a reaction (difficulty breathing--STAT)? No   4. What is your medication issue? The price to high.

## 2019-01-12 NOTE — Telephone Encounter (Signed)
Pt is calling to inform Dr Marlou Porch and RN that with the new start of the year, her Xarelto went from $150 to $400 for a month supply. Pt states that at her last OV Dr Marlou Porch discussed taking her off this med all together, and advising on a different regimen. Pt states that she has exactly 3 days worth of Xarelto left, then she will be completely out. Informed the pt that Dr Marlou Porch is out of the office at this time, but I will route this message to him for further review and recommendation, and a triage nurse will follow-up with her shortly thereafter.  Also endorsed to the pt that I will send a message to our Prior Auth Nurse, to see if she could be of any assistance with helping her get the cost of Xarelto down. Informed the pt that our Prior Auth Nurse will return to the office tomorrow.  Pt verbalized understanding and agrees with this plan.

## 2019-01-12 NOTE — Telephone Encounter (Signed)
Spoke with our Pharmacist in coumadin clinic as well as our Coumadin RN, for further direction of bridging this pt from Xarelto to Coumadin, as ordered by Dr Marlou Porch.   Directions from our Coumadin clinic are below:  Pt should take her Xarelto 15 mg along with Coumadin 5 mg starting tonight at supper for the next 3 nights, then stop Xarelto on Saturday 01/15/19, and continue taking coumadin 5 mg po daily with supper thereafter.  Pt will then come in for a new pt appt to see our Coumadin clinic next Tuesday 01/18/19 at 2:30 pm.    Called the pt and endorsed new medication instructions.  Advised the pt that she must go and pick her coumadin up tonight and take this along with her xarelto, as directed for the next 3 nights.  Advised the pt that she will stop xarelto on Saturday 01/15/19 and continue her coumadin 5 mg po daily with supper thereafter.  Informed the pt that she will need to come in for her new pt coumadin appt next tuesdasy 01/18/19 at 2:30 pm.  Advised her to arrive 15 mins prior to this appt.  Confirmed the pharmacy of choice with the pt. Pt verbalized understanding and agrees with this plan.  Pt was able to verbally read back medication instructions to me,  with no issue at all. Pt gracious for all the assistance provided.

## 2019-01-12 NOTE — Telephone Encounter (Signed)
Stop Xarelto.  Start warfarin per guidance from pharmacy. Candee Furbish, MD

## 2019-01-18 ENCOUNTER — Other Ambulatory Visit: Payer: Self-pay | Admitting: Cardiology

## 2019-01-18 ENCOUNTER — Ambulatory Visit (INDEPENDENT_AMBULATORY_CARE_PROVIDER_SITE_OTHER): Payer: Medicare Other | Admitting: Pharmacist

## 2019-01-18 DIAGNOSIS — Z5181 Encounter for therapeutic drug level monitoring: Secondary | ICD-10-CM

## 2019-01-18 DIAGNOSIS — I480 Paroxysmal atrial fibrillation: Secondary | ICD-10-CM

## 2019-01-18 LAB — PROTIME-INR
INR: 5.11
Prothrombin Time: 46.4 seconds — ABNORMAL HIGH (ref 11.4–15.2)

## 2019-01-18 LAB — POCT INR: INR: 8 — AB (ref 2.0–3.0)

## 2019-01-18 NOTE — Patient Instructions (Addendum)
Do not take any coumadin tonight, tomorrow or Thursday. Recheck INR on Friday. Go to the ER if you have any signs of bleeding. Call us at [phone] with any questions or concerns.  Information on my medicine - Coumadin   (Warfarin)  Why was Coumadin prescribed for you? Coumadin was prescribed for you because you have a blood clot or a medical condition that can cause an increased risk of forming blood clots. Blood clots can cause serious health problems by blocking the flow of blood to the heart, lung, or brain. Coumadin can prevent harmful blood clots from forming. As a reminder your indication for Coumadin is:   Stroke Prevention Because Of Atrial Fibrillation  What test will check on my response to Coumadin? While on Coumadin (warfarin) you will need to have an INR test regularly to ensure that your dose is keeping you in the desired range. The INR (international normalized ratio) number is calculated from the result of the laboratory test called prothrombin time (PT).  What  do you need to  know  About  COUMADIN? Take Coumadin (warfarin) exactly as prescribed by your healthcare provider about the same time each day.  DO NOT stop taking without talking to the doctor who prescribed the medication.  Stopping without other blood clot prevention medication to take the place of Coumadin may increase your risk of developing a new clot or stroke.  Get refills before you run out.  What do you do if you miss a dose? If you miss a dose, take it as soon as you remember on the same day then continue your regularly scheduled regimen the next day.  Do not take two doses of Coumadin at the same time.  Important Safety Information A possible side effect of Coumadin (Warfarin) is an increased risk of bleeding. You should call your healthcare provider right away if you experience any of the following: ? Bleeding from an injury or your nose that does not stop. ? Unusual colored urine (red or dark brown) or  unusual colored stools (red or black). ? Unusual bruising for unknown reasons. ? A serious fall or if you hit your head (even if there is no bleeding).  Some foods or medicines interact with Coumadin (warfarin) and might alter your response to warfarin. To help avoid this: ? Eat a balanced diet, maintaining a consistent amount of Vitamin K. ? Notify your provider about major diet changes you plan to make. ? Avoid alcohol or limit your intake to 1 drink for women and 2 drinks for men per day. (1 drink is 5 oz. wine, 12 oz. beer, or 1.5 oz. liquor.)  Make sure that ANY health care provider who prescribes medication for you knows that you are taking Coumadin (warfarin).  Also make sure the healthcare provider who is monitoring your Coumadin knows when you have started a new medication including herbals and non-prescription products.  Coumadin (Warfarin)  Major Drug Interactions  Increased Warfarin Effect Decreased Warfarin Effect  Alcohol (large quantities) Antibiotics (esp. Septra/Bactrim, Flagyl, Cipro) Amiodarone (Cordarone) Aspirin (ASA) Cimetidine (Tagamet) Megestrol (Megace) NSAIDs (ibuprofen, naproxen, etc.) Piroxicam (Feldene) Propafenone (Rythmol SR) Propranolol (Inderal) Isoniazid (INH) Posaconazole (Noxafil) Barbiturates (Phenobarbital) Carbamazepine (Tegretol) Chlordiazepoxide (Librium) Cholestyramine (Questran) Griseofulvin Oral Contraceptives Rifampin Sucralfate (Carafate) Vitamin K   Coumadin (Warfarin) Major Herbal Interactions  Increased Warfarin Effect Decreased Warfarin Effect  Garlic Ginseng Ginkgo biloba Coenzyme Q10 Green tea St. John's wort    Coumadin (Warfarin) FOOD Interactions  Eat a consistent number of servings per  week of foods HIGH in Vitamin K (1 serving =  cup)  Collards (cooked, or boiled & drained) Kale (cooked, or boiled & drained) Mustard greens (cooked, or boiled & drained) Parsley *serving size only =  cup Spinach (cooked, or  boiled & drained) Swiss chard (cooked, or boiled & drained) Turnip greens (cooked, or boiled & drained)  Eat a consistent number of servings per week of foods MEDIUM-HIGH in Vitamin K (1 serving = 1 cup)  Asparagus (cooked, or boiled & drained) Broccoli (cooked, boiled & drained, or raw & chopped) Brussel sprouts (cooked, or boiled & drained) *serving size only =  cup Lettuce, raw (green leaf, endive, romaine) Spinach, raw Turnip greens, raw & chopped   These websites have more information on Coumadin (warfarin):  FailFactory.se; VeganReport.com.au;

## 2019-01-18 NOTE — Addendum Note (Signed)
Addended by: Marcelle Overlie D on: 01/18/2019 04:59 PM   Modules accepted: Orders

## 2019-01-19 DIAGNOSIS — H4053X1 Glaucoma secondary to other eye disorders, bilateral, mild stage: Secondary | ICD-10-CM | POA: Diagnosis not present

## 2019-01-19 LAB — PROTIME-INR
INR: >10 (ref 0.8–1.2)
Prothrombin Time: 97.6 s — ABNORMAL HIGH (ref 9.1–12.0)

## 2019-01-20 ENCOUNTER — Ambulatory Visit: Payer: Self-pay | Admitting: *Deleted

## 2019-01-20 NOTE — Addendum Note (Signed)
Addended by: Verlin Grills on: 01/20/2019 08:10 AM   Modules accepted: Level of Service, SmartSet

## 2019-01-20 NOTE — Telephone Encounter (Signed)
This encounter was created in error - please disregard.

## 2019-01-24 ENCOUNTER — Inpatient Hospital Stay (HOSPITAL_COMMUNITY)
Admission: EM | Admit: 2019-01-24 | Discharge: 2019-01-31 | DRG: 291 | Disposition: A | Discharge status: Home / Self Care | Payer: Medicare Other | Attending: Internal Medicine | Admitting: Internal Medicine

## 2019-01-24 ENCOUNTER — Other Ambulatory Visit: Payer: Self-pay

## 2019-01-24 ENCOUNTER — Emergency Department (HOSPITAL_COMMUNITY): Payer: Medicare Other

## 2019-01-24 ENCOUNTER — Encounter (HOSPITAL_COMMUNITY): Payer: Self-pay | Admitting: Emergency Medicine

## 2019-01-24 DIAGNOSIS — Z981 Arthrodesis status: Secondary | ICD-10-CM | POA: Diagnosis not present

## 2019-01-24 DIAGNOSIS — Z885 Allergy status to narcotic agent status: Secondary | ICD-10-CM | POA: Diagnosis not present

## 2019-01-24 DIAGNOSIS — N185 Chronic kidney disease, stage 5: Secondary | ICD-10-CM | POA: Diagnosis present

## 2019-01-24 DIAGNOSIS — Z87891 Personal history of nicotine dependence: Secondary | ICD-10-CM | POA: Diagnosis not present

## 2019-01-24 DIAGNOSIS — I10 Essential (primary) hypertension: Secondary | ICD-10-CM | POA: Diagnosis not present

## 2019-01-24 DIAGNOSIS — Z8701 Personal history of pneumonia (recurrent): Secondary | ICD-10-CM

## 2019-01-24 DIAGNOSIS — I4821 Permanent atrial fibrillation: Secondary | ICD-10-CM | POA: Diagnosis present

## 2019-01-24 DIAGNOSIS — G4733 Obstructive sleep apnea (adult) (pediatric): Secondary | ICD-10-CM | POA: Diagnosis present

## 2019-01-24 DIAGNOSIS — Z90710 Acquired absence of both cervix and uterus: Secondary | ICD-10-CM

## 2019-01-24 DIAGNOSIS — Z6841 Body Mass Index (BMI) 40.0 and over, adult: Secondary | ICD-10-CM | POA: Diagnosis not present

## 2019-01-24 DIAGNOSIS — E1129 Type 2 diabetes mellitus with other diabetic kidney complication: Secondary | ICD-10-CM | POA: Diagnosis present

## 2019-01-24 DIAGNOSIS — E1122 Type 2 diabetes mellitus with diabetic chronic kidney disease: Secondary | ICD-10-CM | POA: Diagnosis present

## 2019-01-24 DIAGNOSIS — N2581 Secondary hyperparathyroidism of renal origin: Secondary | ICD-10-CM | POA: Diagnosis not present

## 2019-01-24 DIAGNOSIS — M069 Rheumatoid arthritis, unspecified: Secondary | ICD-10-CM | POA: Diagnosis present

## 2019-01-24 DIAGNOSIS — Z9049 Acquired absence of other specified parts of digestive tract: Secondary | ICD-10-CM

## 2019-01-24 DIAGNOSIS — Z888 Allergy status to other drugs, medicaments and biological substances status: Secondary | ICD-10-CM | POA: Diagnosis not present

## 2019-01-24 DIAGNOSIS — I129 Hypertensive chronic kidney disease with stage 1 through stage 4 chronic kidney disease, or unspecified chronic kidney disease: Secondary | ICD-10-CM | POA: Diagnosis not present

## 2019-01-24 DIAGNOSIS — I5031 Acute diastolic (congestive) heart failure: Secondary | ICD-10-CM | POA: Diagnosis present

## 2019-01-24 DIAGNOSIS — Z79899 Other long term (current) drug therapy: Secondary | ICD-10-CM

## 2019-01-24 DIAGNOSIS — Z800 Family history of malignant neoplasm of digestive organs: Secondary | ICD-10-CM

## 2019-01-24 DIAGNOSIS — J9601 Acute respiratory failure with hypoxia: Secondary | ICD-10-CM | POA: Diagnosis present

## 2019-01-24 DIAGNOSIS — Z7952 Long term (current) use of systemic steroids: Secondary | ICD-10-CM

## 2019-01-24 DIAGNOSIS — I5033 Acute on chronic diastolic (congestive) heart failure: Secondary | ICD-10-CM | POA: Diagnosis present

## 2019-01-24 DIAGNOSIS — I130 Hypertensive heart and chronic kidney disease with heart failure and stage 1 through stage 4 chronic kidney disease, or unspecified chronic kidney disease: Secondary | ICD-10-CM | POA: Diagnosis not present

## 2019-01-24 DIAGNOSIS — I110 Hypertensive heart disease with heart failure: Secondary | ICD-10-CM | POA: Diagnosis not present

## 2019-01-24 DIAGNOSIS — Z9851 Tubal ligation status: Secondary | ICD-10-CM

## 2019-01-24 DIAGNOSIS — Z7901 Long term (current) use of anticoagulants: Secondary | ICD-10-CM | POA: Diagnosis not present

## 2019-01-24 DIAGNOSIS — D631 Anemia in chronic kidney disease: Secondary | ICD-10-CM | POA: Diagnosis not present

## 2019-01-24 DIAGNOSIS — I132 Hypertensive heart and chronic kidney disease with heart failure and with stage 5 chronic kidney disease, or end stage renal disease: Secondary | ICD-10-CM | POA: Diagnosis present

## 2019-01-24 DIAGNOSIS — R0902 Hypoxemia: Secondary | ICD-10-CM

## 2019-01-24 DIAGNOSIS — K219 Gastro-esophageal reflux disease without esophagitis: Secondary | ICD-10-CM | POA: Diagnosis present

## 2019-01-24 DIAGNOSIS — N179 Acute kidney failure, unspecified: Secondary | ICD-10-CM | POA: Diagnosis not present

## 2019-01-24 DIAGNOSIS — K08409 Partial loss of teeth, unspecified cause, unspecified class: Secondary | ICD-10-CM | POA: Diagnosis present

## 2019-01-24 DIAGNOSIS — R801 Persistent proteinuria, unspecified: Secondary | ICD-10-CM | POA: Diagnosis not present

## 2019-01-24 DIAGNOSIS — R0602 Shortness of breath: Secondary | ICD-10-CM | POA: Diagnosis not present

## 2019-01-24 DIAGNOSIS — J90 Pleural effusion, not elsewhere classified: Secondary | ICD-10-CM | POA: Diagnosis not present

## 2019-01-24 DIAGNOSIS — N184 Chronic kidney disease, stage 4 (severe): Secondary | ICD-10-CM | POA: Diagnosis not present

## 2019-01-24 DIAGNOSIS — I4891 Unspecified atrial fibrillation: Secondary | ICD-10-CM | POA: Diagnosis not present

## 2019-01-24 DIAGNOSIS — I509 Heart failure, unspecified: Secondary | ICD-10-CM

## 2019-01-24 DIAGNOSIS — I770 Arteriovenous fistula, acquired: Secondary | ICD-10-CM | POA: Diagnosis not present

## 2019-01-24 DIAGNOSIS — Z8249 Family history of ischemic heart disease and other diseases of the circulatory system: Secondary | ICD-10-CM

## 2019-01-24 DIAGNOSIS — Z833 Family history of diabetes mellitus: Secondary | ICD-10-CM

## 2019-01-24 LAB — CBC
HCT: 37.7 % (ref 36.0–46.0)
Hemoglobin: 10.5 g/dL — ABNORMAL LOW (ref 12.0–15.0)
MCH: 26.6 pg (ref 26.0–34.0)
MCHC: 27.9 g/dL — ABNORMAL LOW (ref 30.0–36.0)
MCV: 95.7 fL (ref 80.0–100.0)
NRBC: 0 % (ref 0.0–0.2)
Platelets: 195 10*3/uL (ref 150–400)
RBC: 3.94 MIL/uL (ref 3.87–5.11)
RDW: 16.9 % — ABNORMAL HIGH (ref 11.5–15.5)
WBC: 10.8 10*3/uL — ABNORMAL HIGH (ref 4.0–10.5)

## 2019-01-24 LAB — URINALYSIS, ROUTINE W REFLEX MICROSCOPIC
Bacteria, UA: NONE SEEN
Bilirubin Urine: NEGATIVE
Glucose, UA: NEGATIVE mg/dL
Hgb urine dipstick: NEGATIVE
Ketones, ur: NEGATIVE mg/dL
Leukocytes, UA: NEGATIVE
Nitrite: NEGATIVE
Protein, ur: 30 mg/dL — AB
Specific Gravity, Urine: 1.008 (ref 1.005–1.030)
pH: 5 (ref 5.0–8.0)

## 2019-01-24 LAB — HEPATIC FUNCTION PANEL
ALT: 28 U/L (ref 0–44)
AST: 26 U/L (ref 15–41)
Albumin: 3.4 g/dL — ABNORMAL LOW (ref 3.5–5.0)
Alkaline Phosphatase: 72 U/L (ref 38–126)
Bilirubin, Direct: 0.2 mg/dL (ref 0.0–0.2)
Indirect Bilirubin: 0.6 mg/dL (ref 0.3–0.9)
Total Bilirubin: 0.8 mg/dL (ref 0.3–1.2)
Total Protein: 6.2 g/dL — ABNORMAL LOW (ref 6.5–8.1)

## 2019-01-24 LAB — BASIC METABOLIC PANEL
Anion gap: 15 (ref 5–15)
BUN: 52 mg/dL — ABNORMAL HIGH (ref 8–23)
CO2: 26 mmol/L (ref 22–32)
CREATININE: 3.88 mg/dL — AB (ref 0.44–1.00)
Calcium: 8.2 mg/dL — ABNORMAL LOW (ref 8.9–10.3)
Chloride: 101 mmol/L (ref 98–111)
GFR calc non Af Amer: 11 mL/min — ABNORMAL LOW (ref >60)
GFR, EST AFRICAN AMERICAN: 13 mL/min — AB (ref >60)
Glucose, Bld: 119 mg/dL — ABNORMAL HIGH (ref 70–99)
Potassium: 4.3 mmol/L (ref 3.5–5.1)
Sodium: 142 mmol/L (ref 135–145)

## 2019-01-24 LAB — LIPASE, BLOOD: Lipase: 33 U/L (ref 11–51)

## 2019-01-24 LAB — I-STAT TROPONIN, ED: Troponin i, poc: 0.03 ng/mL (ref 0.00–0.08)

## 2019-01-24 LAB — PROTIME-INR
INR: 1.56
Prothrombin Time: 18.5 seconds — ABNORMAL HIGH (ref 11.4–15.2)

## 2019-01-24 LAB — BRAIN NATRIURETIC PEPTIDE: B Natriuretic Peptide: 1486.9 pg/mL — ABNORMAL HIGH (ref 0.0–100.0)

## 2019-01-24 MED ORDER — BRIMONIDINE TARTRATE 0.2 % OP SOLN
1.0000 [drp] | Freq: Two times a day (BID) | OPHTHALMIC | Status: DC
  Administered 2019-01-24 – 2019-01-31 (×14): 1 [drp] via OPHTHALMIC
  Filled 2019-01-24: qty 5

## 2019-01-24 MED ORDER — MAGNESIUM OXIDE 400 (241.3 MG) MG PO TABS
400.0000 mg | ORAL_TABLET | Freq: Two times a day (BID) | ORAL | Status: DC
  Administered 2019-01-24 – 2019-01-31 (×14): 400 mg via ORAL
  Filled 2019-01-24 (×15): qty 1

## 2019-01-24 MED ORDER — SODIUM CHLORIDE 0.9% FLUSH
3.0000 mL | Freq: Two times a day (BID) | INTRAVENOUS | Status: DC
  Administered 2019-01-24 – 2019-01-31 (×13): 3 mL via INTRAVENOUS

## 2019-01-24 MED ORDER — HYDRALAZINE HCL 25 MG PO TABS
50.0000 mg | ORAL_TABLET | Freq: Three times a day (TID) | ORAL | Status: DC
  Administered 2019-01-24 – 2019-01-31 (×20): 50 mg via ORAL
  Filled 2019-01-24 (×21): qty 2

## 2019-01-24 MED ORDER — DIPHENHYDRAMINE-APAP (SLEEP) 25-500 MG PO TABS: 1.0000 | ORAL_TABLET | Freq: Every day | ORAL | Status: DC

## 2019-01-24 MED ORDER — DIPHENHYDRAMINE HCL 25 MG PO CAPS: 25.0000 mg | ORAL_CAPSULE | Freq: Every evening | ORAL | Status: DC | PRN

## 2019-01-24 MED ORDER — ACETAMINOPHEN 325 MG PO TABS: 650.0000 mg | ORAL_TABLET | ORAL | Status: DC | PRN

## 2019-01-24 MED ORDER — LEFLUNOMIDE 20 MG PO TABS
10.0000 mg | ORAL_TABLET | Freq: Every day | ORAL | Status: DC
  Administered 2019-01-25 – 2019-01-31 (×7): 10 mg via ORAL
  Filled 2019-01-24 (×7): qty 0.5

## 2019-01-24 MED ORDER — SODIUM CHLORIDE 0.9 % IV SOLN: 250.0000 mL | INTRAVENOUS | Status: DC | PRN

## 2019-01-24 MED ORDER — CALCIUM CARBONATE-VITAMIN D3 600-400 MG-UNIT PO TABS: 1.0000 | ORAL_TABLET | Freq: Two times a day (BID) | ORAL | Status: DC

## 2019-01-24 MED ORDER — CALCIUM CARBONATE-VITAMIN D 500-200 MG-UNIT PO TABS
1.0000 | ORAL_TABLET | Freq: Two times a day (BID) | ORAL | Status: DC
  Administered 2019-01-24 – 2019-01-31 (×14): 1 via ORAL
  Filled 2019-01-24 (×14): qty 1

## 2019-01-24 MED ORDER — LATANOPROST 0.005 % OP SOLN
1.0000 [drp] | Freq: Every day | OPHTHALMIC | Status: DC
  Administered 2019-01-24 – 2019-01-30 (×7): 1 [drp] via OPHTHALMIC
  Filled 2019-01-24: qty 2.5

## 2019-01-24 MED ORDER — DILTIAZEM HCL ER COATED BEADS 120 MG PO CP24
120.0000 mg | ORAL_CAPSULE | Freq: Every day | ORAL | Status: DC
  Administered 2019-01-25 – 2019-01-31 (×7): 120 mg via ORAL
  Filled 2019-01-24 (×7): qty 1

## 2019-01-24 MED ORDER — ONDANSETRON HCL 4 MG/2ML IJ SOLN
4.0000 mg | Freq: Four times a day (QID) | INTRAMUSCULAR | Status: DC | PRN
  Administered 2019-01-29: 4 mg via INTRAVENOUS
  Filled 2019-01-24: qty 2

## 2019-01-24 MED ORDER — WARFARIN SODIUM 5 MG PO TABS: 5.0000 mg | ORAL_TABLET | Freq: Every day | ORAL | Status: DC

## 2019-01-24 MED ORDER — VITAMIN B-12 1000 MCG PO TABS
1000.0000 ug | ORAL_TABLET | Freq: Every day | ORAL | Status: DC
  Administered 2019-01-25 – 2019-01-31 (×7): 1000 ug via ORAL
  Filled 2019-01-24 (×7): qty 1

## 2019-01-24 MED ORDER — PANTOPRAZOLE SODIUM 40 MG PO TBEC
40.0000 mg | DELAYED_RELEASE_TABLET | Freq: Every day | ORAL | Status: DC
  Administered 2019-01-25 – 2019-01-31 (×7): 40 mg via ORAL
  Filled 2019-01-24 (×7): qty 1

## 2019-01-24 MED ORDER — WARFARIN - PHARMACIST DOSING INPATIENT
Freq: Every day | Status: DC
  Administered 2019-01-26 – 2019-01-30 (×3)

## 2019-01-24 MED ORDER — SODIUM CHLORIDE 0.9% FLUSH: 3.0000 mL | INTRAVENOUS | Status: DC | PRN

## 2019-01-24 MED ORDER — CARVEDILOL 12.5 MG PO TABS
25.0000 mg | ORAL_TABLET | Freq: Two times a day (BID) | ORAL | Status: DC
  Administered 2019-01-25 – 2019-01-31 (×13): 25 mg via ORAL
  Filled 2019-01-24 (×13): qty 2

## 2019-01-24 MED ORDER — POTASSIUM CHLORIDE CRYS ER 20 MEQ PO TBCR
20.0000 meq | EXTENDED_RELEASE_TABLET | Freq: Every day | ORAL | Status: DC
  Administered 2019-01-25 – 2019-01-31 (×7): 20 meq via ORAL
  Filled 2019-01-24 (×7): qty 1

## 2019-01-24 MED ORDER — RISAQUAD PO CAPS
1.0000 | ORAL_CAPSULE | Freq: Every day | ORAL | Status: DC
  Administered 2019-01-24 – 2019-01-30 (×7): 1 via ORAL
  Filled 2019-01-24 (×7): qty 1

## 2019-01-24 MED ORDER — TIMOLOL MALEATE 0.5 % OP SOLN
1.0000 [drp] | Freq: Two times a day (BID) | OPHTHALMIC | Status: DC
  Administered 2019-01-24 – 2019-01-31 (×14): 1 [drp] via OPHTHALMIC
  Filled 2019-01-24: qty 5

## 2019-01-24 MED ORDER — FUROSEMIDE 10 MG/ML IJ SOLN
120.0000 mg | Freq: Once | INTRAVENOUS | Status: AC
  Administered 2019-01-24: 120 mg via INTRAVENOUS
  Filled 2019-01-24: qty 10

## 2019-01-24 MED ORDER — ATORVASTATIN CALCIUM 10 MG PO TABS
20.0000 mg | ORAL_TABLET | Freq: Every day | ORAL | Status: DC
  Administered 2019-01-25 – 2019-01-31 (×7): 20 mg via ORAL
  Filled 2019-01-24 (×7): qty 2

## 2019-01-24 MED ORDER — HEPARIN SODIUM (PORCINE) 5000 UNIT/ML IJ SOLN
5000.0000 [IU] | Freq: Three times a day (TID) | INTRAMUSCULAR | Status: DC
  Administered 2019-01-24 – 2019-01-28 (×11): 5000 [IU] via SUBCUTANEOUS
  Filled 2019-01-24 (×10): qty 1

## 2019-01-24 MED ORDER — FUROSEMIDE 10 MG/ML IJ SOLN
120.0000 mg | Freq: Two times a day (BID) | INTRAVENOUS | Status: DC
  Administered 2019-01-25 – 2019-01-28 (×7): 120 mg via INTRAVENOUS
  Filled 2019-01-24: qty 2
  Filled 2019-01-24: qty 12
  Filled 2019-01-24: qty 10
  Filled 2019-01-24: qty 2
  Filled 2019-01-24 (×2): qty 12
  Filled 2019-01-24: qty 10
  Filled 2019-01-24: qty 2

## 2019-01-24 MED ORDER — MAGNESIUM OXIDE -MG SUPPLEMENT 400 (240 MG) MG PO TABS: 400.0000 mg | ORAL_TABLET | Freq: Two times a day (BID) | ORAL | Status: DC

## 2019-01-24 MED ORDER — ALIGN PO CAPS: 1.0000 | ORAL_CAPSULE | Freq: Every day | ORAL | Status: DC

## 2019-01-24 MED ORDER — WARFARIN SODIUM 5 MG PO TABS
5.0000 mg | ORAL_TABLET | Freq: Once | ORAL | Status: DC
  Filled 2019-01-24: qty 1

## 2019-01-24 MED ORDER — PREDNISONE 5 MG PO TABS
5.0000 mg | ORAL_TABLET | Freq: Every day | ORAL | Status: DC
  Administered 2019-01-25 – 2019-01-31 (×7): 5 mg via ORAL
  Filled 2019-01-24 (×7): qty 1

## 2019-01-24 MED ORDER — BRIMONIDINE TARTRATE-TIMOLOL 0.2-0.5 % OP SOLN
1.0000 [drp] | Freq: Two times a day (BID) | OPHTHALMIC | Status: DC
  Filled 2019-01-24: qty 5

## 2019-01-24 MED ORDER — FUROSEMIDE 10 MG/ML IJ SOLN: 80.0000 mg | Freq: Once | INTRAMUSCULAR | Status: DC

## 2019-01-24 NOTE — ED Triage Notes (Signed)
Onset 2 weeks ago developed shortness of breath worsening on exertion. Patient seen at [facility] and sent to the ED for evaluation of possible CHF and shortness of breath. Patient upon arrival oxygen level 86% RA placed on 2L [place] increased 92%. Alert answering and following commands appropriate.

## 2019-01-24 NOTE — H&P (Addendum)
History and Physical    Melissa Bartlett EGB:[id number] DOB: 08-07-1946 DOA: 01/24/2019  PCP: Donald Prose, MD   Patient coming from: Dr. Meda Bartlett office  I have personally briefly reviewed patient's old medical records in [place]  Chief Complaint: Congestive heart failure secondary to kidney disease with associated dyspnea  HPI: Melissa Bartlett is a 73 y.o. female with medical history significant of diastolic congestive heart failure, chronic kidney disease stage IV, diabetes type 2, gastroesophageal reflux disease, hypertension, morbid obesity, obstructive sleep apnea, paroxysmal atrial fibrillation, rheumatoid arthritis and recent AV fistula placement by Dr. Oneida Alar for superficialization of right arm fistula on 01/03/2019.  Patient presented at the request of Dr. Raquel Sarna due to an 8 pound weight gain and obvious dyspnea although she denies feeling short of breath.  She has been complaining recently of increasing fatigue unaffected church yesterday some of her friends commented that she looked terribly short of breath although she did not feel that way.  That over 2 weeks she has been feeling increasingly fatigued.  Has had this weight gain.  Dr. Landry Mellow Don[place]'s office she was noted to have a significant increase in creatinine from her baseline of 3-1/2-3.88.  He was recently switched from [place] to warfarin for her atrial fibrillation and her physician asked her to hold her warfarin due to elevated INR and she was supposed to go to Coumadin clinic tomorrow.  The ER her sats dropped to 86% on room air and she was started on 2 L of oxygen with good resolution of hypoxemia she is on no home O2.  ED Course: INR unremarkable, BNP 1400+, 3.88, chest x-ray shows left basilar atelectasis otherwise unremarkable, furred to medicine for admission  Review of Systems: As per HPI otherwise all other systems reviewed and  negative.    Past Medical History:  Diagnosis Date  . Anemia   . CHF  (congestive heart failure) ([place])   . CKD (chronic kidney disease), stage IV ([facility])   . Complication of anesthesia    " I aspirated during my gallbladder surgery"  . Diabetes ([place])   . GERD (gastroesophageal reflux disease)   . Heart murmur   . HTN (hypertension)   . Morbid obesity with BMI of 45.0-49.9, adult ([place])   . OSA (obstructive sleep apnea)    with AHI 11/hr with nocturnal hypoxemia , uses cpap  . PAF (paroxysmal atrial fibrillation) ([place])    s/p DCCV in 8/17  . Pneumonia   . RA (rheumatoid arthritis) ([place])   . Wears glasses   . Wears partial dentures     Past Surgical History:  Procedure Laterality Date  . ABDOMINAL HYSTERECTOMY    . ANKLE FUSION Right   . AV FISTULA PLACEMENT Right 07/14/2018   Procedure: ARTERIOVENOUS (AV) FISTULA CREATION BRACHIOCEPHALIC;  Surgeon: Elam Dutch, MD;  Location: [facility];  Service: Vascular;  Laterality: Right;  . BREAST BIOPSY    . CARDIOVERSION N/A 08/15/2016   Procedure: CARDIOVERSION;  Surgeon: Jerline Pain, MD;  [facility] ENDOSCOPY;  Service: Cardiovascular;  Laterality: N/A;  . CHOLECYSTECTOMY    . COLONOSCOPY W/ BIOPSIES AND POLYPECTOMY    . DILATION AND CURETTAGE OF UTERUS    . FISTULA SUPERFICIALIZATION Right 01/03/2019   Procedure: SUPERFICIALIZATION OF RIGHT ARM FISTULA;  Surgeon: Elam Dutch, MD;  Location: [facility];  Service: Vascular;  Laterality: Right;  . GALLBLADDER SURGERY    . HYSTEROTOMY     Patrtial  . HYSTEROTOMY  Complete  . KNEE ARTHROSCOPY    . LEFT HEART CATH AND CORONARY ANGIOGRAPHY N/A 11/16/2017   Procedure: LEFT HEART CATH AND CORONARY ANGIOGRAPHY;  Surgeon: Martinique, Peter M, MD;  Location: [facility] CV LAB;  Service: Cardiovascular;  Laterality: N/A;  . MULTIPLE TOOTH EXTRACTIONS    . TUBAL LIGATION      Social History   Social History Narrative  . Not on file     reports that she quit smoking about 2 years ago. Her smoking use included cigarettes. She has never used smokeless  tobacco. She reports that she does not drink alcohol or use drugs.  Allergies  Allergen Reactions  . Enalapril Other (See Comments)    Systemic reaction - jerking  . Codeine Itching    Family History  Problem Relation Age of Onset  . Hypertension Mother        Family HX Diabetes  . Stomach cancer Father     Prior to Admission medications   Medication Sig Start Date End Date Taking? Authorizing Provider  atorvastatin (LIPITOR) 20 MG tablet Take 20 mg by mouth daily.   Yes [provider]  brimonidine-timolol (COMBIGAN) 0.2-0.5 % ophthalmic solution Place 1 drop into both eyes 2 (two) times daily.   Yes [provider]  Calcium Carbonate-Vitamin D3 (CALCIUM 600/VITAMIN D) 600-400 MG-UNIT TABS Take 1 tablet by mouth 2 (two) times daily.   Yes [provider]  carvedilol (COREG) 25 MG tablet TAKE 1 TABLET BY MOUTH TWICE A DAY WITH A MEAL Patient taking differently: Take 25 mg by mouth 2 (two) times daily with a meal. TAKE 1 TABLET BY MOUTH TWICE A DAY WITH A MEAL 06/21/18  Yes Jerline Pain, MD  Darbepoetin Alfa (ARANESP) 60 MCG/0.3ML SOSY injection Inject 60 mcg every 30 (thirty) days into the skin.   Yes [provider]  diltiazem (CARDIZEM CD) 120 MG 24 hr capsule Take 120 mg by mouth daily.   Yes [provider]  diphenhydramine-acetaminophen (TYLENOL PM) 25-500 MG TABS tablet Take 1 tablet by mouth at bedtime.    Yes [provider]  furosemide (LASIX) 20 MG tablet Take 80 mg by mouth 2 (two) times daily.    Yes [provider]  hydrALAZINE (APRESOLINE) 50 MG tablet Take 50 mg by mouth 3 (three) times daily.    Yes [provider]  latanoprost (XALATAN) 0.005 % ophthalmic solution Place 1 drop into both eyes at bedtime. 01/19/19  Yes [provider]  leflunomide (ARAVA) 10 MG tablet Take 10 mg by mouth daily.    Yes [provider]  Magnesium Oxide 400 (240 Mg) MG TABS Take 400 mg by mouth 2 (two)  times daily. 05/27/18  Yes [provider]  pantoprazole (PROTONIX) 40 MG tablet Take 1 tablet (40 mg total) by mouth daily. 10/29/17  Yes Vann, Jessica U, DO  potassium chloride SA (KLOR-CON M20) 20 MEQ tablet Take 1 tablet (20 mEq total) by mouth daily. 02/12/18  Yes Jerline Pain, MD  predniSONE (DELTASONE) 5 MG tablet Take 5 mg by mouth daily.  08/22/17  Yes [provider]  [place] into the lungs at bedtime. CPAP   Yes [provider]  Probiotic Product (ALIGN PO) Take 1 tablet by mouth at bedtime.    Yes [provider]  vitamin B-12 (CYANOCOBALAMIN) 1000 MCG tablet Take 1,000 mcg by mouth daily.   Yes [provider]  warfarin (COUMADIN) 5 MG tablet Take 1 tablet (5  mg total) by mouth daily with supper. 01/12/19  Yes Jerline Pain, MD  FEBUXOSTAT PO Take by mouth.    [provider]    Physical Exam:  Constitutional: NAD, calm, obviously dyspneic with hypoxemia Vitals:   01/24/19 1615 01/24/19 1715 01/24/19 1820 01/24/19 1827  BP: 137/67 (!) 133/103  (!) 148/102  Pulse: 74 81  82  Resp: (!) 24 20  16   Temp:    98.2 F (36.8 C)  TempSrc:    Oral  SpO2: 97% 100%  97%  Weight:   110.9 kg   Height:   5' 2.5" (1.588 m)    Eyes: PERRL, lids and conjunctivae normal ENMT: Mucous membranes are moist. Posterior pharynx clear of any exudate or lesions.Normal dentition.  Neck: normal, supple, no masses, no thyromegaly Respiratory: clear to auscultation bilaterally, no wheezing, no crackles. Normal respiratory effort. No accessory muscle use.  Cardiovascular: Regular rate and rhythm, no murmurs / rubs / gallops.  3 Plus extremity edema;  2+ pedal pulses. No carotid bruits.  Abdomen: no tenderness, no masses palpated. No hepatosplenomegaly. Bowel sounds positive.  Musculoskeletal: no clubbing / cyanosis. No joint deformity upper and lower extremities. Good ROM, no contractures. Normal muscle tone.  Skin: no rashes,  lesions, ulcers. No induration Neurologic: CN 2-12 grossly intact. Sensation intact, DTR normal. Strength 5/5 in all 4.  Psychiatric: Normal judgment and insight. Alert and oriented x 3. Normal mood.     Labs on Admission: I have personally reviewed following labs and imaging studies  CBC: Recent Labs  Lab 01/24/19 1530  WBC 10.8*  HGB 10.5*  HCT 37.7  MCV 95.7  PLT 616   Basic Metabolic Panel: Recent Labs  Lab 01/24/19 1530  NA 142  K 4.3  CL 101  CO2 26  GLUCOSE 119*  BUN 52*  CREATININE 3.88*  CALCIUM 8.2*   GFR: Estimated Creatinine Clearance: 15.6 mL/min (A) (by C-G formula based on SCr of 3.88 mg/dL (H)). Liver Function Tests: Recent Labs  Lab 01/24/19 1530  AST 26  ALT 28  ALKPHOS 72  BILITOT 0.8  PROT 6.2*  ALBUMIN 3.4*   Recent Labs  Lab 01/24/19 1530  LIPASE 33   No results for input(s): AMMONIA in the last 168 hours. Coagulation Profile: Recent Labs  Lab 01/18/19 1458 01/18/19 1545 01/18/19 1707 01/24/19 1532  INR 8.0* >10.0* 5.11* 1.56   Cardiac Enzymes: No results for input(s): CKTOTAL, CKMB, CKMBINDEX, TROPONINI in the last 168 hours. BNP (last 3 results) No results for input(s): PROBNP in the last 8760 hours. HbA1C: No results for input(s): HGBA1C in the last 72 hours. CBG: No results for input(s): GLUCAP in the last 168 hours. Lipid Profile: No results for input(s): CHOL, HDL, LDLCALC, TRIG, CHOLHDL, LDLDIRECT in the last 72 hours. Thyroid Function Tests: No results for input(s): TSH, T4TOTAL, FREET4, T3FREE, THYROIDAB in the last 72 hours. Anemia Panel: No results for input(s): VITAMINB12, FOLATE, FERRITIN, TIBC, IRON, RETICCTPCT in the last 72 hours. Urine analysis:    Component Value Date/Time   COLORURINE STRAW (A) 01/24/2019 [place] 01/24/2019 1645   LABSPEC 1.008 01/24/2019 1645   PHURINE 5.0 01/24/2019 [place] 01/24/2019 1645   HGBUR NEGATIVE 01/24/2019 [place]  NEGATIVE 01/24/2019 [place] 01/24/2019 1645   PROTEINUR 30 (A) 01/24/2019 1645   UROBILINOGEN 0.2 04/28/2010 0304   NITRITE NEGATIVE 01/24/2019 1645   LEUKOCYTESUR NEGATIVE 01/24/2019 1645    Radiological  Exams on Admission: Dg Chest 2 View  Result Date: 01/24/2019 CLINICAL DATA:  Dyspnea on exertion. EXAM: CHEST - 2 VIEW COMPARISON:  Radiographs of September 24, 2018. FINDINGS: Stable cardiomegaly. No pneumothorax is noted. Stable loculated effusions are noted in the right minor and major fissures. Minimal left basilar atelectasis or scarring is noted. Bony thorax is unremarkable. IMPRESSION: Minimal left basilar atelectasis or scarring. Stable loculated effusions in right minor and major fissures. No other significant abnormality is noted. Electronically Signed   By: Marijo Conception, M.D.   On: 01/24/2019 12:15    EKG: Independently reviewed.  EKG shows atrial fibrillation at 82 bpm with right superior axis deviation, anterior infarct when compared with September 24, 2018 there is no change.  Assessment/Plan Principal Problem:   Acute diastolic CHF (congestive heart failure) (HCC) Active Problems:   Acute respiratory failure with hypoxia (HCC)   Acute renal failure superimposed on stage 5 chronic kidney disease, not on chronic dialysis (HCC)   DM (diabetes mellitus), type 2 with renal complications (HCC)   OSA (obstructive sleep apnea)   Essential hypertension   Morbid obesity (HCC)   Permanent atrial fibrillation   RA (rheumatoid arthritis) ([place])  1.  Acute diastolic congestive heart failure:    Acute diastolic congestive heart failure.  Last echocardiogram obtained in January 2019 showed an ejection fraction of 55% with a PA pressure of 40.  Patient will be placed in the hospital started on Lasix 120 mg IV every 12 hours.  If she does not respond would consider increasing that or adding metolazone.  I have consulted nephrology given that I believe that her heart  failure is due to renal issues.  2.  Acute respiratory failure with hypoxia: Continue oxygen supplementation should improve this patient diuresis.  Will monitor I's and O's closely and place her on a 1200 mL fluid restriction.  3.  Acute renal failure superimposed on stage V chronic kidney disease currently not on chronic dialysis: Nephrology has been consulted on not sure if the patient will require dialysis this admission.  I did mention it to her and she became quite anxious about that.  She will require lots of education.  4.  Diabetes mellitus type 2 with renal complications: Continue home medication regimen we will add sliding scale insulin coverage.  Hold metformin if taking  5.  Obstructive sleep apnea: Continue CPAP as at home.  Patient asked to provide her machine.  6.  Essential hypertension: Continue home blood pressure regimen.  7.  Morbid obesity: Patient's blood pressure and renal function as well as her cardiac function would improve dramatically if she were able to lose 50 or 60 pounds.  She would benefit from a nutrition consult when less ill.  8.  Permanent atrial fibrillation: On warfarin currently on hold due to INR.  Will check INR in the emergency department and ask pharmacy to dose warfarin.  9.  Rheumatoid arthritis noted continue home medications.  DVT prophylaxis: Subcu heparin Code Status: Full code Family Communication: Spoke with patient's husband who was present at the time of admission.  Patient retains capacity. Disposition Plan: Likely home in 3 to 4 days Consults called: Nephrology, Dr. Jimmy Footman.  Will see patient on 1/28. Admission status: Inpatient   Lady Deutscher MD FACP [facility] Pager [phone]  If 7PM-7AM, please contact night-coverage [email or web address] Password [facility]  01/24/2019, 6:32 PM

## 2019-01-24 NOTE — Progress Notes (Signed)
RT set up CPAP at patient's bedside. RT informed patient to have RN call RT when she is ready to go on CPAP for the night.

## 2019-01-24 NOTE — Progress Notes (Signed)
ANTICOAGULATION CONSULT NOTE - Initial Consult  Pharmacy Consult for Warfarin Indication: atrial fibrillation  Allergies  Allergen Reactions  . Enalapril Other (See Comments)    Systemic reaction - jerking  . Codeine Itching    Patient Measurements: Height: 5' 2.5" (158.8 cm) Weight: 249 lb (112.9 kg) IBW/kg (Calculated) : 51.25  Vital Signs: Temp: 98.3 F (36.8 C) (01/27 1335) Temp Source: Oral (01/27 1335) BP: 133/103 (01/27 1715) Pulse Rate: 81 (01/27 1715)  Labs: Recent Labs    01/24/19 1530 01/24/19 1532  HGB 10.5*  --   HCT 37.7  --   PLT 195  --   LABPROT  --  18.5*  INR  --  1.56  CREATININE 3.88*  --     Estimated Creatinine Clearance: 15.7 mL/min (A) (by C-G formula based on SCr of 3.88 mg/dL (H)).   Medical History: Past Medical History:  Diagnosis Date  . Anemia   . CHF (congestive heart failure) ([place])   . CKD (chronic kidney disease), stage IV ([place])   . Complication of anesthesia    " I aspirated during my gallbladder surgery"  . Diabetes ([place])   . GERD (gastroesophageal reflux disease)   . Heart murmur   . HTN (hypertension)   . Morbid obesity with BMI of 45.0-49.9, adult ([place])   . OSA (obstructive sleep apnea)    with AHI 11/hr with nocturnal hypoxemia , uses cpap  . PAF (paroxysmal atrial fibrillation) ([place])    s/p DCCV in 8/17  . Pneumonia   . RA (rheumatoid arthritis) ([place])   . Wears glasses   . Wears partial dentures      Assessment: 73 yo female presents with progressively worsening generalized weakness for 2 weeks. Pharmacy consulted for warfarin dosing for atrial fibrillation.  INR 1.56  Home warfarin dose is 5 mg po qday  Goal of Therapy:  INR 2-3    Plan:  Warfarin 5 mg po x 1 Monitor INR and CBC  Alanda Slim, PharmD, [facility] Clinical Pharmacist Please see AMION for all Pharmacists' Contact Phone Numbers 01/24/2019, 6:00 PM

## 2019-01-24 NOTE — ED Notes (Signed)
I was unable to get blood on PT

## 2019-01-24 NOTE — Progress Notes (Signed)
RT placed patient on CPAP with 2L O2 bled into circuit. Patient tolerating well at this time. RT will monitor as needed. 

## 2019-01-24 NOTE — ED Triage Notes (Signed)
Onset 2 weeks ago developed shortness of breath worsening on exertion. Patient seen at [facility] and sent to the ED for evaluation of possible CHF and shortness of breath. Patient upon arrival oxygen level 86% RA placed on 2L [place] increased 92%. Alert answering and following commands appropriate.

## 2019-01-24 NOTE — ED Provider Notes (Signed)
[facility] EMERGENCY DEPARTMENT Provider Note   CSN: [id number] Arrival date & time: 01/24/19  1106     History   Chief Complaint Chief Complaint  Patient presents with  . Shortness of Breath    HPI Melissa Bartlett is a 73 y.o. female with history of CHF, CKD, diabetes, GERD, HTN, obesity, OSA, PAF presents for evaluation of acute onset, progressively worsening generalized weakness for 2 weeks.  She reports feeling very fatigued but denies any shortness of breath or chest pain.  She reports that she sleeps on 3 pillows at baseline but is unsure if she has any orthopnea.  She does note progressively worsening bilateral lower extremity edema and reports when she went to see her nephrologist today he told her she had gained 8 pounds.  She has been taking her home Lasix without relief.  She does also note intermittent nausea and vomiting that has been ongoing for several months, will have approximately 1-2 episodes of nonbloody nonbilious emesis weekly.  Reports this worsens after drinking coffee or water.  Denies abdominal pain, diarrhea, constipation, or change in urination.  No fevers, chills, cough.  She went to see her nephrologist today who recommended presenting to the ED for evaluation of CHF exacerbation.  Recently had AV fistula placed in preparation for starting dialysis.  The history is provided by the patient.    Past Medical History:  Diagnosis Date  . Anemia   . CHF (congestive heart failure) ([place])   . CKD (chronic kidney disease), stage IV ([place])   . Complication of anesthesia    " I aspirated during my gallbladder surgery"  . Diabetes ([place])   . GERD (gastroesophageal reflux disease)   . Heart murmur   . HTN (hypertension)   . Morbid obesity with BMI of 45.0-49.9, adult ([place])   . OSA (obstructive sleep apnea)    with AHI 11/hr with nocturnal hypoxemia , uses cpap  . PAF (paroxysmal atrial fibrillation) ([place])    s/p DCCV in 8/17  . Pneumonia   . RA  (rheumatoid arthritis) ([place])   . Wears glasses   . Wears partial dentures     Patient Active Problem List   Diagnosis Date Noted  . AKI (acute kidney injury) ([place]) 02/10/2018  . Acute respiratory failure with hypoxia ([place]) 01/27/2018  . Permanent atrial fibrillation 01/27/2018  . RA (rheumatoid arthritis) ([place]) 01/27/2018  . Acute diastolic CHF (congestive heart failure) ([place]) 01/27/2018  . Chest pain, rule out acute myocardial infarction 10/28/2017  . Syncope 03/14/2017  . Chest pain 03/14/2017  . OSA (obstructive sleep apnea)   . Paroxysmal atrial fibrillation ([place]) 07/31/2016  . CKD (chronic kidney disease) stage 4, GFR 15-29 ml/min ([facility]) 07/31/2016  . Essential hypertension 07/09/2015  . Morbid obesity ([place]) 07/09/2015  . Diabetes ([place]) 03/07/2014    Past Surgical History:  Procedure Laterality Date  . ABDOMINAL HYSTERECTOMY    . ANKLE FUSION Right   . AV FISTULA PLACEMENT Right 07/14/2018   Procedure: ARTERIOVENOUS (AV) FISTULA CREATION BRACHIOCEPHALIC;  Surgeon: Elam Dutch, MD;  Location: [facility];  Service: Vascular;  Laterality: Right;  . BREAST BIOPSY    . CARDIOVERSION N/A 08/15/2016   Procedure: CARDIOVERSION;  Surgeon: Jerline Pain, MD;  Location[facility] ENDOSCOPY;  Service: Cardiovascular;  Laterality: N/A;  . CHOLECYSTECTOMY    . COLONOSCOPY W/ BIOPSIES AND POLYPECTOMY    . DILATION AND CURETTAGE OF UTERUS    . FISTULA SUPERFICIALIZATION Right 01/03/2019   Procedure: SUPERFICIALIZATION  OF RIGHT ARM FISTULA;  Surgeon: Elam Dutch, MD;  Location: [facility];  Service: Vascular;  Laterality: Right;  . GALLBLADDER SURGERY    . HYSTEROTOMY     Patrtial  . HYSTEROTOMY     Complete  . KNEE ARTHROSCOPY    . LEFT HEART CATH AND CORONARY ANGIOGRAPHY N/A 11/16/2017   Procedure: LEFT HEART CATH AND CORONARY ANGIOGRAPHY;  Surgeon: Martinique, Peter M, MD;  Location: [facility] CV LAB;  Service: Cardiovascular;  Laterality: N/A;  . MULTIPLE TOOTH EXTRACTIONS    . TUBAL  LIGATION       OB History   No obstetric history on file.      Home Medications    Prior to Admission medications   Medication Sig Start Date End Date Taking? Authorizing Provider  atorvastatin (LIPITOR) 20 MG tablet Take 20 mg by mouth daily.   Yes [provider]  brimonidine-timolol (COMBIGAN) 0.2-0.5 % ophthalmic solution Place 1 drop into both eyes 2 (two) times daily.   Yes [provider]  Calcium Carbonate-Vitamin D3 (CALCIUM 600/VITAMIN D) 600-400 MG-UNIT TABS Take 1 tablet by mouth 2 (two) times daily.   Yes [provider]  carvedilol (COREG) 25 MG tablet TAKE 1 TABLET BY MOUTH TWICE A DAY WITH A MEAL Patient taking differently: Take 25 mg by mouth 2 (two) times daily with a meal. TAKE 1 TABLET BY MOUTH TWICE A DAY WITH A MEAL 06/21/18  Yes Jerline Pain, MD  Darbepoetin Alfa (ARANESP) 60 MCG/0.3ML SOSY injection Inject 60 mcg every 30 (thirty) days into the skin.   Yes [provider]  diltiazem (CARDIZEM CD) 120 MG 24 hr capsule Take 120 mg by mouth daily.   Yes [provider]  diphenhydramine-acetaminophen (TYLENOL PM) 25-500 MG TABS tablet Take 1 tablet by mouth at bedtime.    Yes [provider]  furosemide (LASIX) 20 MG tablet Take 80 mg by mouth 2 (two) times daily.    Yes [provider]  hydrALAZINE (APRESOLINE) 50 MG tablet Take 50 mg by mouth 3 (three) times daily.    Yes [provider]  latanoprost (XALATAN) 0.005 % ophthalmic solution Place 1 drop into both eyes at bedtime. 01/19/19  Yes [provider]  leflunomide (ARAVA) 10 MG tablet Take 10 mg by mouth daily.    Yes [provider]  Magnesium Oxide 400 (240 Mg) MG TABS Take 400 mg by mouth 2 (two) times daily. 05/27/18  Yes [provider]  pantoprazole (PROTONIX) 40 MG tablet Take 1 tablet (40 mg total) by mouth daily. 10/29/17  Yes Vann, Jessica U, DO  potassium chloride SA (KLOR-CON M20) 20 MEQ tablet Take 1  tablet (20 mEq total) by mouth daily. 02/12/18  Yes Jerline Pain, MD  predniSONE (DELTASONE) 5 MG tablet Take 5 mg by mouth daily.  08/22/17  Yes [provider]  [place] into the lungs at bedtime. CPAP   Yes [provider]  Probiotic Product (ALIGN PO) Take 1 tablet by mouth at bedtime.    Yes [provider]  vitamin B-12 (CYANOCOBALAMIN) 1000 MCG tablet Take 1,000 mcg by mouth daily.   Yes [provider]  warfarin (COUMADIN) 5 MG tablet Take 1 tablet (5 mg total) by mouth daily with supper. 01/12/19  Yes Jerline Pain, MD  FEBUXOSTAT PO Take by mouth.    [provider]  oxyCODONE-acetaminophen (PERCOCET/ROXICET) 5-325 MG tablet Take 1 tablet by mouth every 6 (six) hours as  needed. Patient not taking: Reported on 01/24/2019 01/03/19   Ulyses Amor, PA-C  XARELTO 15 MG TABS tablet TAKE 1 TABLET DAILY WITH SUPPER BY MOUTH. Patient not taking: No sig reported 12/06/18   Jerline Pain, MD    Family History Family History  Problem Relation Age of Onset  . Hypertension Mother        Family HX Diabetes  . Stomach cancer Father     Social History Social History   Tobacco Use  . Smoking status: Former Smoker    Types: Cigarettes    Last attempt to quit: 04/08/2016    Years since quitting: 2.7  . Smokeless tobacco: Never Used  Substance Use Topics  . Alcohol use: No    Alcohol/week: 0.0 standard drinks  . Drug use: No     Allergies   Enalapril and Codeine   Review of Systems Review of Systems  Constitutional: Positive for fatigue. Negative for chills and fever.  Respiratory: Negative for shortness of breath.   Cardiovascular: Positive for leg swelling. Negative for chest pain.  Gastrointestinal: Positive for nausea and vomiting. Negative for abdominal pain, constipation and diarrhea.  Genitourinary: Negative for decreased urine volume, dysuria, frequency, hematuria and urgency.  All other systems reviewed  and are negative.    Physical Exam Updated Vital Signs BP 140/77 (BP Location: Right Wrist)   Pulse 87   Temp 98.3 F (36.8 C) (Oral)   Resp 20   Ht 5' 2.5" (1.588 m)   Wt 112.9 kg   LMP  (LMP Unknown)   SpO2 97%   BMI 44.82 kg/m   Physical Exam Vitals signs and nursing note reviewed.  Constitutional:      General: She is not in acute distress.    Appearance: She is well-developed.  HENT:     Head: Normocephalic and atraumatic.  Eyes:     General:        Right eye: No discharge.        Left eye: No discharge.     Conjunctiva/sclera: Conjunctivae normal.  Neck:     Vascular: No JVD.     Trachea: No tracheal deviation.  Cardiovascular:     Rate and Rhythm: Normal rate.     Pulses: Normal pulses.     Comments: AV fistula in the right upper extremity with palpable thrill.  3+ pitting edema of the bilateral lower extremities, no palpable cords, compartments are soft.  Homans sign absent bilaterally.  2+ radial and DP/PT pulses bilaterally Pulmonary:     Effort: Pulmonary effort is normal.     Comments: Globally diminished, though body habitus limits examination.  Speaking in full sentences without difficulty.  SPO2 saturations 97% on 2 L via nasal cannula. Abdominal:     General: Bowel sounds are normal. There is no distension.     Palpations: Abdomen is soft.     Tenderness: There is no abdominal tenderness. There is no guarding or rebound.  Musculoskeletal:     Right lower leg: She exhibits no tenderness. Edema present.     Left lower leg: She exhibits no tenderness. Edema present.  Skin:    General: Skin is warm and dry.     Findings: No erythema.  Neurological:     Mental Status: She is alert.  Psychiatric:        Behavior: Behavior normal.      ED Treatments / Results  Labs (all labs ordered are listed, but only abnormal results are displayed) Labs Reviewed  BASIC METABOLIC PANEL - Abnormal; Notable for the following components:      Result Value    Glucose, Bld 119 (*)    BUN 52 (*)    Creatinine, Ser 3.88 (*)    Calcium 8.2 (*)    GFR calc non Af Amer 11 (*)    GFR calc Af Amer 13 (*)    All other components within normal limits  CBC - Abnormal; Notable for the following components:   WBC 10.8 (*)    Hemoglobin 10.5 (*)    MCHC 27.9 (*)    RDW 16.9 (*)    All other components within normal limits  PROTIME-INR - Abnormal; Notable for the following components:   Prothrombin Time 18.5 (*)    All other components within normal limits  HEPATIC FUNCTION PANEL - Abnormal; Notable for the following components:   Total Protein 6.2 (*)    Albumin 3.4 (*)    All other components within normal limits  LIPASE, BLOOD  BRAIN NATRIURETIC PEPTIDE  URINALYSIS, ROUTINE W REFLEX MICROSCOPIC  I-STAT TROPONIN, ED    EKG EKG Interpretation  Date/Time:  Monday January 24 2019 11:35:58 EST Ventricular Rate:  82 PR Interval:    QRS Duration: 72 QT Interval:  362 QTC Calculation: 422 R Axis:   -106 Text Interpretation:  Atrial fibrillation Right superior axis deviation Anterior infarct , age undetermined Abnormal ECG Confirmed by Quintella Reichert [phone]) on 01/24/2019 2:38:11 PM   Radiology Dg Chest 2 View  Result Date: 01/24/2019 CLINICAL DATA:  Dyspnea on exertion. EXAM: CHEST - 2 VIEW COMPARISON:  Radiographs of September 24, 2018. FINDINGS: Stable cardiomegaly. No pneumothorax is noted. Stable loculated effusions are noted in the right minor and major fissures. Minimal left basilar atelectasis or scarring is noted. Bony thorax is unremarkable. IMPRESSION: Minimal left basilar atelectasis or scarring. Stable loculated effusions in right minor and major fissures. No other significant abnormality is noted. Electronically Signed   By: Marijo Conception, M.D.   On: 01/24/2019 12:15    Procedures Procedures (including critical care time)  Medications Ordered in ED Medications  furosemide (LASIX) injection 80 mg (has no administration in time  range)     Initial Impression / Assessment and Plan / ED Course  I have reviewed the triage vital signs and the nursing notes.  Pertinent labs & imaging results that were available during my care of the patient were reviewed by me and considered in my medical decision making (see chart for details).     Patient presenting for evaluation of progressively worsening bilateral lower extremity edema and generalized weakness/fatigue for 2 weeks.  Sent from her nephrologist Dr. Elissa Hefty office with concern for CHF exacerbation.  She is afebrile, initially hypoxic on room air with improvement on 2 L via nasal cannula.  She does not have an oxygen requirement at home.  Chest x-ray shows no significant changes, with stable loculated effusions in the right minor and major fissures.  EKG shows atrial fibrillation.  Awaiting lab work for further evaluation.  Will likely require admission due to new oxygen requirement.  4:37 PM Lab work reviewed by me shows mild leukocytosis, anemia, creatinine and BUN elevated at around the patient's baseline.  Patient was given a dose of IV Lasix in the ED.  Spoke with Dr. Evangeline Gula with [facility] hospitalist service who agrees to assume care of patient and bring her into the hospital for further evaluation and management.  Patient seen and evaluated by Dr. Ralene Bathe who agrees  with assessment and plan at this time.   Final Clinical Impressions(s) / ED Diagnoses   Final diagnoses:  Acute on chronic congestive heart failure, unspecified heart failure type [facility])  Hypoxia    ED Discharge Orders    None       Debroah Baller 01/24/19 1639    Quintella Reichert, MD 01/25/19 [phone]

## 2019-01-24 NOTE — Progress Notes (Signed)
Living Better with Heart Failure booklet given and reviewed with patient.

## 2019-01-25 ENCOUNTER — Encounter (HOSPITAL_COMMUNITY): Payer: Medicare Other

## 2019-01-25 DIAGNOSIS — I4821 Permanent atrial fibrillation: Secondary | ICD-10-CM

## 2019-01-25 DIAGNOSIS — J9601 Acute respiratory failure with hypoxia: Secondary | ICD-10-CM

## 2019-01-25 DIAGNOSIS — G4733 Obstructive sleep apnea (adult) (pediatric): Secondary | ICD-10-CM

## 2019-01-25 DIAGNOSIS — I10 Essential (primary) hypertension: Secondary | ICD-10-CM

## 2019-01-25 DIAGNOSIS — M069 Rheumatoid arthritis, unspecified: Secondary | ICD-10-CM

## 2019-01-25 LAB — BASIC METABOLIC PANEL
Anion gap: 12 (ref 5–15)
BUN: 51 mg/dL — AB (ref 8–23)
CO2: 29 mmol/L (ref 22–32)
Calcium: 8.4 mg/dL — ABNORMAL LOW (ref 8.9–10.3)
Chloride: 102 mmol/L (ref 98–111)
Creatinine, Ser: 3.78 mg/dL — ABNORMAL HIGH (ref 0.44–1.00)
GFR calc Af Amer: 13 mL/min — ABNORMAL LOW (ref >60)
GFR calc non Af Amer: 11 mL/min — ABNORMAL LOW (ref >60)
Glucose, Bld: 93 mg/dL (ref 70–99)
Potassium: 4.1 mmol/L (ref 3.5–5.1)
Sodium: 143 mmol/L (ref 135–145)

## 2019-01-25 LAB — CBC
HCT: 33.5 % — ABNORMAL LOW (ref 36.0–46.0)
Hemoglobin: 9.7 g/dL — ABNORMAL LOW (ref 12.0–15.0)
MCH: 27.2 pg (ref 26.0–34.0)
MCHC: 29 g/dL — ABNORMAL LOW (ref 30.0–36.0)
MCV: 94.1 fL (ref 80.0–100.0)
Platelets: 166 10*3/uL (ref 150–400)
RBC: 3.56 MIL/uL — AB (ref 3.87–5.11)
RDW: 16.9 % — ABNORMAL HIGH (ref 11.5–15.5)
WBC: 10.5 10*3/uL (ref 4.0–10.5)
nRBC: 0 % (ref 0.0–0.2)

## 2019-01-25 LAB — PROTIME-INR
INR: 1.49
PROTHROMBIN TIME: 17.8 s — AB (ref 11.4–15.2)

## 2019-01-25 MED ORDER — CAMPHOR-MENTHOL 0.5-0.5 % EX LOTN
TOPICAL_LOTION | CUTANEOUS | Status: DC | PRN
  Filled 2019-01-25: qty 222

## 2019-01-25 MED ORDER — ALUM & MAG HYDROXIDE-SIMETH 200-200-20 MG/5ML PO SUSP
30.0000 mL | Freq: Four times a day (QID) | ORAL | Status: DC | PRN
  Administered 2019-01-26 – 2019-01-30 (×4): 30 mL via ORAL
  Filled 2019-01-25 (×4): qty 30

## 2019-01-25 MED ORDER — WARFARIN SODIUM 7.5 MG PO TABS
7.5000 mg | ORAL_TABLET | Freq: Once | ORAL | Status: AC
  Administered 2019-01-25: 7.5 mg via ORAL
  Filled 2019-01-25: qty 1

## 2019-01-25 NOTE — Plan of Care (Signed)

## 2019-01-25 NOTE — Progress Notes (Signed)
SATURATION QUALIFICATIONS: (This note is used to comply with regulatory documentation for home oxygen)  Patient Saturations on Room Air at Rest = 100%  Patient Saturations on Room Air while Ambulating = 82%  Patient Saturations on 2 Liters of oxygen while Ambulating = 92%  Please briefly explain why patient needs home oxygen:

## 2019-01-25 NOTE — Discharge Instructions (Signed)

## 2019-01-25 NOTE — Consult Note (Signed)
Reason for Consult: CKD IV Referring Physician:  Dr. Evangeline Gula  Chief Complaint:  Dyspnea  Assessment/Plan: 1. 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 I would hold off on the addition of metolazone at this time as long as she is rseponding to IV diuresis. I also counseled her about how to get the 3rd dose of Lasix in at home earlier in the day to avoid issues with frequent urination at night leading to insomnia. - She is fairly careful with her Na intake. - Fluid restriction to 1.2-1.5L / 24hr. - Strict I&O's -> based on her clinical response I am more apt to believe the daily weights. - Will need to give a trial of oral diuretics prior to d/c to ensure response; worried about gut edema which if present may req conversion to Torsemide. - No indication to initiate dialysis which is fortunate as the fistula was only superficialized 01/03/2018.  2. Respiratory distress w/ hypoxia - improved oxygenation and much more comfortable today. 3. CHF w/ preserved EF - certainly renal failure is not helping but fortunately responding to diuresis at this point with stable renal function. 4. DM 5. HTN 6. Afib 7. OSA    HPI: Melissa Bartlett is an 73 y.o. female with DM dCHF w/ preserved LV function HTN OSA obesity and CKDIV with a baseline Cr ~3.4-3.8 followed by Dr. Marval Regal. She has very advanced CKD and had a right BCF placed by Dr. Oneida Alar 07/14/2018 and then elevation 01/03/2019. She was referred from [facility] for dyspnea, increased weights and was subsequently placed on IV Lasix BID; at home she's on Lasix 80mg  PO BID. She has a floor scale at home and has noted increasing weights but was worried about when to take the additional 3rd dose of Lasix as it had the potential of keeping her up at night. She denies excessive Na intake and eats out once a week. She denies obstructive like symptoms, dysuria, hematuria or NSAID use.  She was noted to have a 8 lb weight gain over the past few  weeks and in the ED her sats were 86%. She was started on Lasix 120mg  IV BID with good response. Her weights have already decr from 112.9kg to 109.4kg with no foley in place. She denies anorexia, n/v/rashes/dizziness.  ROS Pertinent items are noted in HPI.  Chemistry and CBC: Creat  Date/Time Value Ref Range Status  08/01/2016 01:45 PM 2.22 (H) 0.50 - 0.99 mg/dL Final    Comment:      For patients > or = 73 years of age: The upper reference limit for Creatinine is approximately 13% higher for people identified as African-American.      Creatinine, Ser  Date/Time Value Ref Range Status  01/25/2019 04:25 AM 3.78 (H) 0.44 - 1.00 mg/dL Final  01/24/2019 03:30 PM 3.88 (H) 0.44 - 1.00 mg/dL Final  09/24/2018 03:42 PM 3.65 (H) 0.44 - 1.00 mg/dL Final  09/23/2018 09:09 PM 3.47 (H) 0.44 - 1.00 mg/dL Final  05/17/2018 04:12 PM 3.74 (H) 0.44 - 1.00 mg/dL Final  05/17/2018 04:40 AM 3.51 (H) 0.44 - 1.00 mg/dL Final  05/16/2018 03:56 AM 3.41 (H) 0.44 - 1.00 mg/dL Final  05/15/2018 03:48 AM 2.86 (H) 0.44 - 1.00 mg/dL Final  05/14/2018 04:10 PM 3.18 (H) 0.44 - 1.00 mg/dL Final  03/10/2018 10:49 AM 2.77 (H) 0.44 - 1.00 mg/dL Final  03/10/2018 10:15 AM 2.79 (H) 0.57 - 1.00 mg/dL Final  02/10/2018 10:43 AM 3.47 (H) 0.44 -  1.00 mg/dL Final  01/30/2018 02:00 AM 2.98 (H) 0.44 - 1.00 mg/dL Final  01/29/2018 02:13 AM 2.83 (H) 0.44 - 1.00 mg/dL Final  01/28/2018 04:28 AM 2.90 (H) 0.44 - 1.00 mg/dL Final  01/27/2018 02:29 PM 2.82 (H) 0.44 - 1.00 mg/dL Final  01/19/2018 12:12 PM 2.65 (H) 0.57 - 1.00 mg/dL Final  01/14/2018 11:44 AM 2.72 (H) 0.57 - 1.00 mg/dL Final  12/14/2017 10:20 AM 2.45 (H) 0.44 - 1.00 mg/dL Final  11/16/2017 08:27 AM 2.28 (H) 0.44 - 1.00 mg/dL Final  11/10/2017 11:37 AM 2.52 (H) 0.57 - 1.00 mg/dL Final  10/27/2017 09:08 PM 2.93 (H) 0.44 - 1.00 mg/dL Final  10/09/2017 07:05 PM 3.03 (H) 0.44 - 1.00 mg/dL Final  09/22/2017 10:28 AM 2.45 (H) 0.44 - 1.00 mg/dL Final  07/28/2017 10:22  AM 2.33 (H) 0.44 - 1.00 mg/dL Final  03/15/2017 09:40 AM 2.39 (H) 0.44 - 1.00 mg/dL Final  03/14/2017 09:33 AM 2.43 (H) 0.44 - 1.00 mg/dL Final  07/09/2011 04:40 AM 1.61 (H) 0.50 - 1.10 mg/dL Final    Comment:    **Please note change in reference range.**  07/08/2011 03:50 AM 1.76 (H) 0.50 - 1.10 mg/dL Final    Comment:    **Please note change in reference range.**  07/07/2011 04:50 AM 1.65 (H) 0.50 - 1.10 mg/dL Final    Comment:    **Please note change in reference range.**  07/06/2011 02:16 AM 1.63 (H) 0.50 - 1.10 mg/dL Final    Comment:    **Please note change in reference range.**  07/05/2011 11:35 PM 1.56 (H) 0.50 - 1.10 mg/dL Final    Comment:    **Please note change in reference range.**  05/07/2010 03:25 AM 1.46 (H) 0.4 - 1.2 mg/dL Final  05/06/2010 04:10 AM 1.37 (H) 0.4 - 1.2 mg/dL Final  05/05/2010 03:52 AM 1.31 (H) 0.4 - 1.2 mg/dL Final  05/04/2010 08:18 AM 1.14 0.4 - 1.2 mg/dL Final  05/02/2010 03:44 AM 1.31 (H) 0.4 - 1.2 mg/dL Final  05/01/2010 03:41 AM 1.43 (H) 0.4 - 1.2 mg/dL Final  04/30/2010 03:45 AM 1.75 (H) 0.4 - 1.2 mg/dL Final  04/29/2010 03:45 AM 2.12 (H) 0.4 - 1.2 mg/dL Final  04/27/2010 11:15 PM 1.78 (H) 0.4 - 1.2 mg/dL Final  03/27/2008 03:00 PM 1.18  Final   Recent Labs  Lab 01/24/19 1530 01/25/19 0425  NA 142 143  K 4.3 4.1  CL 101 102  CO2 26 29  GLUCOSE 119* 93  BUN 52* 51*  CREATININE 3.88* 3.78*  CALCIUM 8.2* 8.4*   Recent Labs  Lab 01/24/19 1530 01/25/19 0425  WBC 10.8* 10.5  HGB 10.5* 9.7*  HCT 37.7 33.5*  MCV 95.7 94.1  PLT 195 166   Liver Function Tests: Recent Labs  Lab 01/24/19 1530  AST 26  ALT 28  ALKPHOS 72  BILITOT 0.8  PROT 6.2*  ALBUMIN 3.4*   Recent Labs  Lab 01/24/19 1530  LIPASE 33   No results for input(s): AMMONIA in the last 168 hours. Cardiac Enzymes: No results for input(s): CKTOTAL, CKMB, CKMBINDEX, TROPONINI in the last 168 hours. Iron Studies: No results for input(s): IRON, TIBC, TRANSFERRIN,  FERRITIN in the last 72 hours. PT/INR: @LABRCNTIP (inr:5)  Xrays/Other Studies: ) Results for orders placed or performed during the hospital encounter of 01/24/19 (from the past 48 hour(s))  Basic metabolic panel     Status: Abnormal   Collection Time: 01/24/19  3:30 PM  Result Value Ref Range   Sodium 142 135 -  145 mmol/L   Potassium 4.3 3.5 - 5.1 mmol/L   Chloride 101 98 - 111 mmol/L   CO2 26 22 - 32 mmol/L   Glucose, Bld 119 (H) 70 - 99 mg/dL   BUN 52 (H) 8 - 23 mg/dL   Creatinine, Ser 3.88 (H) 0.44 - 1.00 mg/dL   Calcium 8.2 (L) 8.9 - 10.3 mg/dL   GFR calc non Af Amer 11 (L) >60 mL/min   GFR calc Af Amer 13 (L) >60 mL/min   Anion gap 15 5 - 15    Comment: Performed at [facility] [address] [place], [place] [address]  CBC     Status: Abnormal   Collection Time: 01/24/19  3:30 PM  Result Value Ref Range   WBC 10.8 (H) 4.0 - 10.5 K/uL   RBC 3.94 3.87 - 5.11 MIL/uL   Hemoglobin 10.5 (L) 12.0 - 15.0 g/dL   HCT 37.7 36.0 - 46.0 %   MCV 95.7 80.0 - 100.0 fL   MCH 26.6 26.0 - 34.0 pg   MCHC 27.9 (L) 30.0 - 36.0 g/dL   RDW 16.9 (H) 11.5 - 15.5 %   Platelets 195 150 - 400 K/uL   nRBC 0.0 0.0 - 0.2 %    Comment: Performed at [facility] [address] [place], [place] [address]  Brain natriuretic peptide     Status: Abnormal   Collection Time: 01/24/19  3:30 PM  Result Value Ref Range   B Natriuretic Peptide 1,486.9 (H) 0.0 - 100.0 pg/mL    Comment: Performed at [facility] [address] [place], [place] [address]  Hepatic function panel     Status: Abnormal   Collection Time: 01/24/19  3:30 PM  Result Value Ref Range   Total Protein 6.2 (L) 6.5 - 8.1 g/dL   Albumin 3.4 (L) 3.5 - 5.0 g/dL   AST 26 15 - 41 U/L   ALT 28 0 - 44 U/L   Alkaline Phosphatase 72 38 - 126 U/L   Total Bilirubin 0.8 0.3 - 1.2 mg/dL   Bilirubin, Direct 0.2 0.0 - 0.2 mg/dL   Indirect Bilirubin 0.6 0.3 - 0.9 mg/dL    Comment: Performed at [facility]  [address], [place], [place] [address]  Lipase, blood     Status: None   Collection Time: 01/24/19  3:30 PM  Result Value Ref Range   Lipase 33 11 - 51 U/L    Comment: Performed at [facility] [address], [place], [place] [address]  Protime-INR     Status: Abnormal   Collection Time: 01/24/19  3:32 PM  Result Value Ref Range   Prothrombin Time 18.5 (H) 11.4 - 15.2 seconds   INR 1.56     Comment: Performed at [facility] [address] [place], [place] [address]  I-stat troponin, ED     Status: None   Collection Time: 01/24/19  3:38 PM  Result Value Ref Range   Troponin i, poc 0.03 0.00 - 0.08 ng/mL   Comment 3            Comment: Due to the release kinetics of cTnI, a negative result within the first hours of the onset of symptoms does not rule out myocardial infarction with certainty. If myocardial infarction is still suspected, repeat the test at appropriate intervals.   Urinalysis, Routine w reflex microscopic     Status: Abnormal   Collection Time:  01/24/19  4:45 PM  Result Value Ref Range   Color, Urine STRAW (A) YELLOW   APPearance CLEAR CLEAR   Specific Gravity, Urine 1.008 1.005 - 1.030   pH 5.0 5.0 - 8.0   Glucose, UA NEGATIVE NEGATIVE mg/dL   Hgb urine dipstick NEGATIVE NEGATIVE   Bilirubin Urine NEGATIVE NEGATIVE   Ketones, ur NEGATIVE NEGATIVE mg/dL   Protein, ur 30 (A) NEGATIVE mg/dL   Nitrite NEGATIVE NEGATIVE   Leukocytes, UA NEGATIVE NEGATIVE   WBC, UA 0-5 0 - 5 WBC/hpf   Bacteria, UA NONE SEEN NONE SEEN   Squamous Epithelial / LPF 0-5 0 - 5   Mucus PRESENT     Comment: Performed at [facility] [address] [place], [place] [address]  Basic metabolic panel     Status: Abnormal   Collection Time: 01/25/19  4:25 AM  Result Value Ref Range   Sodium 143 135 - 145 mmol/L   Potassium 4.1 3.5 - 5.1 mmol/L   Chloride 102 98 - 111 mmol/L   CO2 29 22 - 32 mmol/L   Glucose, Bld 93 70 - 99 mg/dL   BUN 51 (H) 8 - 23 mg/dL   Creatinine, Ser  3.78 (H) 0.44 - 1.00 mg/dL   Calcium 8.4 (L) 8.9 - 10.3 mg/dL   GFR calc non Af Amer 11 (L) >60 mL/min   GFR calc Af Amer 13 (L) >60 mL/min   Anion gap 12 5 - 15    Comment: Performed at [facility], [place] [address] [place], [place] [address]  Protime-INR     Status: Abnormal   Collection Time: 01/25/19  4:25 AM  Result Value Ref Range   Prothrombin Time 17.8 (H) 11.4 - 15.2 seconds   INR 1.49     Comment: Performed at [facility] [address], [place], [place] [address]  CBC     Status: Abnormal   Collection Time: 01/25/19  4:25 AM  Result Value Ref Range   WBC 10.5 4.0 - 10.5 K/uL   RBC 3.56 (L) 3.87 - 5.11 MIL/uL   Hemoglobin 9.7 (L) 12.0 - 15.0 g/dL   HCT 33.5 (L) 36.0 - 46.0 %   MCV 94.1 80.0 - 100.0 fL   MCH 27.2 26.0 - 34.0 pg   MCHC 29.0 (L) 30.0 - 36.0 g/dL   RDW 16.9 (H) 11.5 - 15.5 %   Platelets 166 150 - 400 K/uL   nRBC 0.0 0.0 - 0.2 %    Comment: Performed at [facility], [place] [address] [place], [place] [address]   Dg Chest 2 View  Result Date: 01/24/2019 CLINICAL DATA:  Dyspnea on exertion. EXAM: CHEST - 2 VIEW COMPARISON:  Radiographs of September 24, 2018. FINDINGS: Stable cardiomegaly. No pneumothorax is noted. Stable loculated effusions are noted in the right minor and major fissures. Minimal left basilar atelectasis or scarring is noted. Bony thorax is unremarkable. IMPRESSION: Minimal left basilar atelectasis or scarring. Stable loculated effusions in right minor and major fissures. No other significant abnormality is noted. Electronically Signed   By: Marijo Conception, M.D.   On: 01/24/2019 12:15    PMH:   Past Medical History:  Diagnosis Date  . Anemia   . CHF (congestive heart failure) ([facility])   . CKD (chronic kidney disease), stage IV ([facility])   . Complication of anesthesia    " I aspirated during my gallbladder surgery"  . Diabetes ([facility])   . GERD (  gastroesophageal reflux disease)   . Heart murmur   . HTN (hypertension)   . Morbid  obesity with BMI of 45.0-49.9, adult ([facility])   . OSA (obstructive sleep apnea)    with AHI 11/hr with nocturnal hypoxemia , uses cpap  . PAF (paroxysmal atrial fibrillation) ([facility])    s/p DCCV in 8/17  . Pneumonia   . RA (rheumatoid arthritis) ([facility])   . Wears glasses   . Wears partial dentures     PSH:   Past Surgical History:  Procedure Laterality Date  . ABDOMINAL HYSTERECTOMY    . ANKLE FUSION Right   . AV FISTULA PLACEMENT Right 07/14/2018   Procedure: ARTERIOVENOUS (AV) FISTULA CREATION BRACHIOCEPHALIC;  Surgeon: Elam Dutch, MD;  Location: [facility];  Service: Vascular;  Laterality: Right;  . BREAST BIOPSY    . CARDIOVERSION N/A 08/15/2016   Procedure: CARDIOVERSION;  Surgeon: Jerline Pain, MD;  Location[facility] ENDOSCOPY;  Service: Cardiovascular;  Laterality: N/A;  . CHOLECYSTECTOMY    . COLONOSCOPY W/ BIOPSIES AND POLYPECTOMY    . DILATION AND CURETTAGE OF UTERUS    . FISTULA SUPERFICIALIZATION Right 01/03/2019   Procedure: SUPERFICIALIZATION OF RIGHT ARM FISTULA;  Surgeon: Elam Dutch, MD;  Location: [facility];  Service: Vascular;  Laterality: Right;  . GALLBLADDER SURGERY    . HYSTEROTOMY     Patrtial  . HYSTEROTOMY     Complete  . KNEE ARTHROSCOPY    . LEFT HEART CATH AND CORONARY ANGIOGRAPHY N/A 11/16/2017   Procedure: LEFT HEART CATH AND CORONARY ANGIOGRAPHY;  Surgeon: Martinique, Peter M, MD;  Location: [facility] CV LAB;  Service: Cardiovascular;  Laterality: N/A;  . MULTIPLE TOOTH EXTRACTIONS    . TUBAL LIGATION      Allergies:  Allergies  Allergen Reactions  . Enalapril Other (See Comments)    Systemic reaction - jerking  . Codeine Itching    Medications:   Prior to Admission medications   Medication Sig Start Date End Date Taking? Authorizing Provider  atorvastatin (LIPITOR) 20 MG tablet Take 20 mg by mouth daily.   Yes [provider]  brimonidine-timolol (COMBIGAN) 0.2-0.5 % ophthalmic solution Place 1 drop into both eyes 2 (two) times daily.    Yes [provider]  Calcium Carbonate-Vitamin D3 (CALCIUM 600/VITAMIN D) 600-400 MG-UNIT TABS Take 1 tablet by mouth 2 (two) times daily.   Yes [provider]  carvedilol (COREG) 25 MG tablet TAKE 1 TABLET BY MOUTH TWICE A DAY WITH A MEAL Patient taking differently: Take 25 mg by mouth 2 (two) times daily with a meal. TAKE 1 TABLET BY MOUTH TWICE A DAY WITH A MEAL 06/21/18  Yes Jerline Pain, MD  Darbepoetin Alfa (ARANESP) 60 MCG/0.3ML SOSY injection Inject 60 mcg every 30 (thirty) days into the skin.   Yes [provider]  diltiazem (CARDIZEM CD) 120 MG 24 hr capsule Take 120 mg by mouth daily.   Yes [provider]  diphenhydramine-acetaminophen (TYLENOL PM) 25-500 MG TABS tablet Take 1 tablet by mouth at bedtime.    Yes [provider]  furosemide (LASIX) 20 MG tablet Take 80 mg by mouth 2 (two) times daily.    Yes [provider]  hydrALAZINE (APRESOLINE) 50 MG tablet Take 50 mg by mouth 3 (three) times daily.    Yes [provider]  latanoprost (XALATAN) 0.005 % ophthalmic solution Place 1 drop into both eyes at bedtime. 01/19/19  Yes [provider]  leflunomide (ARAVA) 10 MG tablet  Take 10 mg by mouth daily.    Yes [provider]  Magnesium Oxide 400 (240 Mg) MG TABS Take 400 mg by mouth 2 (two) times daily. 05/27/18  Yes [provider]  pantoprazole (PROTONIX) 40 MG tablet Take 1 tablet (40 mg total) by mouth daily. 10/29/17  Yes Vann, Jessica U, DO  potassium chloride SA (KLOR-CON M20) 20 MEQ tablet Take 1 tablet (20 mEq total) by mouth daily. 02/12/18  Yes Jerline Pain, MD  predniSONE (DELTASONE) 5 MG tablet Take 5 mg by mouth daily.  08/22/17  Yes [provider]  Shaniko into the lungs at bedtime. CPAP   Yes [provider]  Probiotic Product (ALIGN PO) Take 1 tablet by mouth at bedtime.    Yes [provider]  vitamin B-12 (CYANOCOBALAMIN) 1000 MCG  tablet Take 1,000 mcg by mouth daily.   Yes [provider]  warfarin (COUMADIN) 5 MG tablet Take 1 tablet (5 mg total) by mouth daily with supper. 01/12/19  Yes Jerline Pain, MD  FEBUXOSTAT PO Take by mouth.    [provider]    Discontinued Meds:   Medications Discontinued During This Encounter  Medication Reason  . furosemide (LASIX) injection 80 mg   . XARELTO 15 MG TABS tablet   . oxyCODONE-acetaminophen (PERCOCET/ROXICET) 5-325 MG tablet   . bifidobacterium infantis (ALIGN) capsule 1 capsule   . Calcium Carbonate-Vitamin D3 600-400 MG-UNIT TABS 1 tablet   . Magnesium Oxide TABS 400 mg   . diphenhydramine-acetaminophen (TYLENOL PM) 25-500 MG per tablet 1 tablet   . brimonidine-timolol (COMBIGAN) 0.2-0.5 % ophthalmic solution 1 drop   . warfarin (COUMADIN) tablet 5 mg Duplicate  . warfarin (COUMADIN) tablet 5 mg     Social History:  reports that she quit smoking about 2 years ago. Her smoking use included cigarettes. She has never used smokeless tobacco. She reports that she does not drink alcohol or use drugs.  Family History:   Family History  Problem Relation Age of Onset  . Hypertension Mother        Family HX Diabetes  . Stomach cancer Father     Blood pressure (!) 152/74, pulse 87, temperature (!) 97.5 F (36.4 C), temperature source Oral, resp. rate 19, height 5' 2.5" (1.588 m), weight 109.4 kg, SpO2 97 %. General appearance: alert, cooperative and appears stated age Head: Normocephalic, without obvious abnormality, atraumatic Eyes: negative Neck: no adenopathy, no carotid bruit, supple, symmetrical, trachea midline and thyroid not enlarged, symmetric, no tenderness/mass/nodules Back: symmetric, no curvature. ROM normal. No CVA tenderness. Resp: poor air movement but no rales noted at bases Chest wall: no tenderness Cardio: irregular rhythm GI: soft, non-tender; bowel sounds normal; no masses,  no organomegaly and obese Extremities: edema  3+ Pulses: 2+ and symmetric Skin: Skin color, texture, turgor normal. No rashes or lesions Lymph nodes: Cervical, supraclavicular, and axillary nodes normal. Neurologic: Grossly normal       Masa Lubin, Hunt Oris, MD 01/25/2019, 10:50 AM

## 2019-01-25 NOTE — Care Management Note (Signed)
Case Management Note  Patient Details  Name: Melissa Bartlett MRN: [id number] Date of Birth: [birth year]  Subjective/Objective:    CHF               Action/Plan: Patient lives at home with spouse; PCP: Donald Prose, MD; has private insurance with Medicare; [facility] [address]; DME - cane at home; she states that she cooks without salt and has scales at home; patient could benefit from a Disease Management Program for CHF; [facility] choice offered, pt chose [facility]; Dan with [facility] called for arrangements.  Expected Discharge Date:  Possibly 01/29/2019            Expected Discharge Plan:  [facility]  Discharge planning Services  CM Consult  Choice offered to:  Patient  HH Arranged:  RN, Disease Management [facility] Agency:  [facility]  Status of Service:  In process, will continue to follow  Sherrilyn Rist [phone] 01/25/2019, 11:23 AM

## 2019-01-25 NOTE — Progress Notes (Signed)
PROGRESS NOTE    [name]  IHK:[id number] DOB: 09/18/1946 DOA: 01/24/2019 PCP: Donald Prose, MD   Brief Narrative: Melissa Bartlett is a 73 y.o. female with medical history significant of diastolic congestive heart failure, chronic kidney disease stage IV, diabetes type 2, gastroesophageal reflux disease, hypertension, morbid obesity, obstructive sleep apnea, paroxysmal atrial fibrillation, rheumatoid arthritis and recent AV fistula placement. Patient presented secondary to fluid overload and is being diuresed.   Assessment & Plan:   Principal Problem:   Acute diastolic CHF (congestive heart failure) (HCC) Active Problems:   DM (diabetes mellitus), type 2 with renal complications (HCC)   Essential hypertension   Morbid obesity (HCC)   OSA (obstructive sleep apnea)   Acute respiratory failure with hypoxia (HCC)   Permanent atrial fibrillation   RA (rheumatoid arthritis) ([place])   Acute renal failure superimposed on stage 5 chronic kidney disease, not on chronic dialysis ([place])   Acute on chronic diastolic heart failure History of diastolic heart failure in chart from January of 2019. Currently being diuresed. Nephrology on board. Weight on admission of 244 lbs. UOP over the last 24 hours likely inaccurate and weight of 241 lbs today. -Continue Lasix 120 mg IV -Daily weights and strict in/out -Transthoracic Echocardiogram  Acute respiratory failure with hypoxia Patient does not use oxygen as an outpatient. Currently on 2 L via nasal canula -Wean to room air -Keep O2 >92%  CKD stage IV Baseline creatinine of 3.4-3.8. Currently at baseline.  Permanent atrial fibrillation Rate controlled. -Continue Warfarin, Cardizem PO and Coreg  OSA CPAP qhs  Diabetes mellitus, type 2 History. Last hemoglobin A1C of 5.9% in 04/2018. Currently diet controlled only.  Rheumatoid arthritis No flare -Continue prednisone  Essential hypertension Normotensive -Continue Coreg, Cardizem  PO  Morbid obesity Body mass index is 43.41 kg/m.   DVT prophylaxis: Heparin subq Code Status:   Code Status: Full Code Family Communication: Husband at bedside Disposition Plan: Discharge home pending wean to room air and improvement of heart failure   Consultants:   Nephrology  Procedures:   None  Antimicrobials:  None    Subjective: Dyspnea improving. Still requiring oxygen supplementation.  Objective: Vitals:   01/24/19 2322 01/25/19 0627 01/25/19 0740 01/25/19 1221  BP:  140/62 (!) 152/74 117/67  Pulse: (!) 107 80 87   Resp: 16 18 19    Temp:  (!) 97.5 F (36.4 C) (!) 97.5 F (36.4 C) 97.6 F (36.4 C)  TempSrc:  Oral Oral Oral  SpO2: 99% 100% 97% 99%  Weight:  109.4 kg    Height:        Intake/Output Summary (Last 24 hours) at 01/25/2019 1523 Last data filed at 01/25/2019 1239 Gross per 24 hour  Intake 483 ml  Output 600 ml  Net -117 ml   Filed Weights   01/24/19 1139 01/24/19 1820 01/25/19 0627  Weight: 112.9 kg 110.9 kg 109.4 kg    Examination:  General exam: Appears calm and comfortable Respiratory system: Clear but diminished. Respiratory effort normal. Cardiovascular system: S1 & S2 heard, RRR. No murmurs, rubs, gallops or clicks. Gastrointestinal system: Abdomen is nondistended, soft and nontender. No organomegaly or masses felt. Normal bowel sounds heard. Central nervous system: Alert and oriented. No focal neurological deficits. Extremities: No calf tenderness Skin: No cyanosis. No rashes Psychiatry: Judgement and insight appear normal. Mood & affect appropriate.     Data Reviewed: I have personally reviewed following labs and imaging studies  CBC: Recent Labs  Lab 01/24/19 1530 01/25/19  0425  WBC 10.8* 10.5  HGB 10.5* 9.7*  HCT 37.7 33.5*  MCV 95.7 94.1  PLT 195 476   Basic Metabolic Panel: Recent Labs  Lab 01/24/19 1530 01/25/19 0425  NA 142 143  K 4.3 4.1  CL 101 102  CO2 26 29  GLUCOSE 119* 93  BUN 52* 51*    CREATININE 3.88* 3.78*  CALCIUM 8.2* 8.4*   GFR: Estimated Creatinine Clearance: 15.8 mL/min (A) (by C-G formula based on SCr of 3.78 mg/dL (H)). Liver Function Tests: Recent Labs  Lab 01/24/19 1530  AST 26  ALT 28  ALKPHOS 72  BILITOT 0.8  PROT 6.2*  ALBUMIN 3.4*   Recent Labs  Lab 01/24/19 1530  LIPASE 33   No results for input(s): AMMONIA in the last 168 hours. Coagulation Profile: Recent Labs  Lab 01/18/19 1545 01/18/19 1707 01/24/19 1532 01/25/19 0425  INR >10.0* 5.11* 1.56 1.49   Cardiac Enzymes: No results for input(s): CKTOTAL, CKMB, CKMBINDEX, TROPONINI in the last 168 hours. BNP (last 3 results) No results for input(s): PROBNP in the last 8760 hours. HbA1C: No results for input(s): HGBA1C in the last 72 hours. CBG: No results for input(s): GLUCAP in the last 168 hours. Lipid Profile: No results for input(s): CHOL, HDL, LDLCALC, TRIG, CHOLHDL, LDLDIRECT in the last 72 hours. Thyroid Function Tests: No results for input(s): TSH, T4TOTAL, FREET4, T3FREE, THYROIDAB in the last 72 hours. Anemia Panel: No results for input(s): VITAMINB12, FOLATE, FERRITIN, TIBC, IRON, RETICCTPCT in the last 72 hours. Sepsis Labs: No results for input(s): PROCALCITON, LATICACIDVEN in the last 168 hours.  No results found for this or any previous visit (from the past 240 hour(s)).       Radiology Studies: Dg Chest 2 View  Result Date: 01/24/2019 CLINICAL DATA:  Dyspnea on exertion. EXAM: CHEST - 2 VIEW COMPARISON:  Radiographs of September 24, 2018. FINDINGS: Stable cardiomegaly. No pneumothorax is noted. Stable loculated effusions are noted in the right minor and major fissures. Minimal left basilar atelectasis or scarring is noted. Bony thorax is unremarkable. IMPRESSION: Minimal left basilar atelectasis or scarring. Stable loculated effusions in right minor and major fissures. No other significant abnormality is noted. Electronically Signed   By: Marijo Conception, M.D.    On: 01/24/2019 12:15        Scheduled Meds: . acidophilus  1 capsule Oral QHS  . atorvastatin  20 mg Oral Daily  . brimonidine  1 drop Both Eyes BID  . calcium-vitamin D  1 tablet Oral BID  . carvedilol  25 mg Oral BID WC  . diltiazem  120 mg Oral Daily  . heparin  5,000 Units Subcutaneous Q8H  . hydrALAZINE  50 mg Oral TID  . latanoprost  1 drop Both Eyes QHS  . leflunomide  10 mg Oral Daily  . magnesium oxide  400 mg Oral BID  . pantoprazole  40 mg Oral Daily  . potassium chloride SA  20 mEq Oral Daily  . predniSONE  5 mg Oral Daily  . sodium chloride flush  3 mL Intravenous Q12H  . timolol  1 drop Both Eyes BID  . vitamin B-12  1,000 mcg Oral Daily  . warfarin  7.5 mg Oral ONCE-1800  . Warfarin - Pharmacist Dosing Inpatient   Does not apply q1800   Continuous Infusions: . sodium chloride    . furosemide 120 mg (01/25/19 0953)     LOS: 1 day     Cordelia Poche, MD [facility]  01/25/2019, 3:23 PM  If 7PM-7AM, please contact night-coverage [email or web address]

## 2019-01-25 NOTE — Progress Notes (Signed)
[facility] for Warfarin Indication: atrial fibrillation  Allergies  Allergen Reactions  . Enalapril Other (See Comments)    Systemic reaction - jerking  . Codeine Itching    Patient Measurements: Height: 5' 2.5" (158.8 cm) Weight: 241 lb 3.2 oz (109.4 kg)(scale a) IBW/kg (Calculated) : 51.25  Vital Signs: Temp: 97.5 F (36.4 C) (01/28 0740) Temp Source: Oral (01/28 0740) BP: 152/74 (01/28 0740) Pulse Rate: 87 (01/28 0740)  Labs: Recent Labs    01/24/19 1530 01/24/19 1532 01/25/19 0425  HGB 10.5*  --  9.7*  HCT 37.7  --  33.5*  PLT 195  --  166  LABPROT  --  18.5* 17.8*  INR  --  1.56 1.49  CREATININE 3.88*  --  3.78*    Estimated Creatinine Clearance: 15.8 mL/min (A) (by C-G formula based on SCr of 3.78 mg/dL (H)).   Medical History: Past Medical History:  Diagnosis Date  . Anemia   . CHF (congestive heart failure) ([place])   . CKD (chronic kidney disease), stage IV ([place])   . Complication of anesthesia    " I aspirated during my gallbladder surgery"  . Diabetes ([place])   . GERD (gastroesophageal reflux disease)   . Heart murmur   . HTN (hypertension)   . Morbid obesity with BMI of 45.0-49.9, adult ([place])   . OSA (obstructive sleep apnea)    with AHI 11/hr with nocturnal hypoxemia , uses cpap  . PAF (paroxysmal atrial fibrillation) ([place])    s/p DCCV in 8/17  . Pneumonia   . RA (rheumatoid arthritis) ([place])   . Wears glasses   . Wears partial dentures      Assessment: 73 yo female presents with progressively worsening generalized weakness for 2 weeks. Pharmacy consulted for warfarin dosing for atrial fibrillation.  INR is subtherapeutic at 1.49- on concurrent subQ heparin. Dose was not given last night- unclear why. Hgb 9.7, plt 166. No s/sx of bleeding.   Home warfarin dose is 5 mg po qday  Goal of Therapy:  INR 2-3  Plan:  Warfarin 7.5 mg po x 1 Monitor INR and CBC  Antonietta Jewel, PharmD, BCCCP Clinical  Pharmacist  Pager: [phone] Phone: [phone] Please see AMION for all Pharmacists' Contact Phone Numbers 01/25/2019, 9:45 AM

## 2019-01-25 NOTE — Consult Note (Signed)
   [facility] Inpatient Consult   01/25/2019  Melissa Bartlett [birth year] [id number]  Patient is currently active with [facility] for chronic disease management services.  Patient has been engaged by a [facility].  Our community based plan of care has focused on disease management.  Patient will receive a post hospital call and will be evaluated for assessments and disease process education.    Primary Care Provider: Dr. Donald Prose, this provider does the transition of care follow up.  [facility] uses [facility] and she denies any medications issues she states, "I was originally suppose to be on Jennye Moccasin but it was going to cost me over $400.00 so I am on Coumadin now.  Transportation is from her husband.  Met with the patient at the bedside.  Patient states she was doing well but had become short of breath and per MD notes admitted for Congestive heart failure secondary to kidney disease with associated dyspnea. She states would like a [facility] calendar to help her continue to record her weight.  This Probation officer provided the patient with a 2020 [facility] calendar, brochure with 24 hour nurse advise line magnet.  Will be followed by a [facility]. Patient states she is to also have [place]  for follow up, as well.  Inpatient RNCM notes reviewed.  Patient has an active consent on file.  Of note, [facility] Care Management services does not replace or interfere with any services that are needed or arranged by inpatient case management or social work.  For additional questions or referrals please contact:  Natividad Brood, RN BSN [facility] Liaison  [phone] business mobile phone Toll free office [phone]

## 2019-01-26 ENCOUNTER — Inpatient Hospital Stay (HOSPITAL_COMMUNITY): Payer: Medicare Other

## 2019-01-26 ENCOUNTER — Encounter: Payer: Self-pay | Admitting: *Deleted

## 2019-01-26 DIAGNOSIS — I5031 Acute diastolic (congestive) heart failure: Secondary | ICD-10-CM

## 2019-01-26 LAB — BASIC METABOLIC PANEL
Anion gap: 12 (ref 5–15)
BUN: 57 mg/dL — ABNORMAL HIGH (ref 8–23)
CO2: 29 mmol/L (ref 22–32)
Calcium: 8.8 mg/dL — ABNORMAL LOW (ref 8.9–10.3)
Chloride: 103 mmol/L (ref 98–111)
Creatinine, Ser: 3.82 mg/dL — ABNORMAL HIGH (ref 0.44–1.00)
GFR calc non Af Amer: 11 mL/min — ABNORMAL LOW (ref >60)
GFR, EST AFRICAN AMERICAN: 13 mL/min — AB (ref >60)
Glucose, Bld: 73 mg/dL (ref 70–99)
Potassium: 4.2 mmol/L (ref 3.5–5.1)
SODIUM: 144 mmol/L (ref 135–145)

## 2019-01-26 LAB — ECHOCARDIOGRAM COMPLETE
Height: 62.5 in
Weight: 3875.2 oz

## 2019-01-26 LAB — CBC WITH DIFFERENTIAL/PLATELET
ABS IMMATURE GRANULOCYTES: 0.07 10*3/uL (ref 0.00–0.07)
BASOS ABS: 0 10*3/uL (ref 0.0–0.1)
Basophils Relative: 0 %
Eosinophils Absolute: 0.1 10*3/uL (ref 0.0–0.5)
Eosinophils Relative: 1 %
HCT: 35.7 % — ABNORMAL LOW (ref 36.0–46.0)
Hemoglobin: 10.3 g/dL — ABNORMAL LOW (ref 12.0–15.0)
Immature Granulocytes: 1 %
Lymphocytes Relative: 19 %
Lymphs Abs: 1.7 10*3/uL (ref 0.7–4.0)
MCH: 27.3 pg (ref 26.0–34.0)
MCHC: 28.9 g/dL — ABNORMAL LOW (ref 30.0–36.0)
MCV: 94.7 fL (ref 80.0–100.0)
Monocytes Absolute: 1 10*3/uL (ref 0.1–1.0)
Monocytes Relative: 10 %
NEUTROS ABS: 6.3 10*3/uL (ref 1.7–7.7)
Neutrophils Relative %: 69 %
Platelets: 211 10*3/uL (ref 150–400)
RBC: 3.77 MIL/uL — ABNORMAL LOW (ref 3.87–5.11)
RDW: 17.1 % — ABNORMAL HIGH (ref 11.5–15.5)
WBC: 9.2 10*3/uL (ref 4.0–10.5)
nRBC: 0 % (ref 0.0–0.2)

## 2019-01-26 LAB — PROTIME-INR
INR: 1.39
Prothrombin Time: 16.9 seconds — ABNORMAL HIGH (ref 11.4–15.2)

## 2019-01-26 MED ORDER — PERFLUTREN LIPID MICROSPHERE
1.0000 mL | INTRAVENOUS | Status: AC | PRN
  Administered 2019-01-26: 3 mL via INTRAVENOUS
  Filled 2019-01-26: qty 10

## 2019-01-26 MED ORDER — WARFARIN SODIUM 5 MG PO TABS
5.0000 mg | ORAL_TABLET | Freq: Once | ORAL | Status: AC
  Administered 2019-01-26: 5 mg via ORAL
  Filled 2019-01-26: qty 1

## 2019-01-26 NOTE — Patient Outreach (Signed)
[facility]) Care Management  [id number]  Melissa Bartlett [birth year] [id number]   RN Health Coach telephone call to patient.  [employer] compliance verified. Per patient she is doing good. Patient has not been weighing daily. RN discussed the importance of weighing. Patient was not understanding that the fluid could accumulate and you not be aware. Patient did not have swelling but had a hacking cough. Patient was trying to eat low sodium foods but needs a better understanding of processed, canned foods.  Patient has agreed to further outreach calls.   Assessment:  Patient does not weigh daily Patient will benefit from Health Coach telephonic outreach for education and support for Congestive Heart Failure self management.  Plan: RN sent 2020 Calendar book RN sent CHF packet Clinical Key Education on Low sodium eating plan  Clinical Key Education on Dash eating plan Clinical Key Education on Cooking with less salt Clinical Key Education on Heart failure eating plan Clinical Key Education on Healthy eating RN sent picture sheet of foods high and low in sodium RN will follow within the month of March  Melissa Bartlett [facility] [phone]

## 2019-01-26 NOTE — Progress Notes (Signed)
PROGRESS NOTE    Melissa Bartlett  UKG:[id number] DOB: [birth year] DOA: 01/24/2019 PCP: Donald Prose, MD   Brief Narrative: Patient is a 73 year old female with past medical history of diastolic congestive heart failure, CKD stage IV, diabetes type 2, GERD, hypertension, OSA, paroxysmal A. fib, rheumatoid arthritis who presented with fluid overload.  Admitted for acute on chronic CHF exacerbation.  Nephrology also consulted for worsening kidney function.  Started on aggressive diuresis.  Assessment & Plan:   Principal Problem:   Acute diastolic CHF (congestive heart failure) (HCC) Active Problems:   DM (diabetes mellitus), type 2 with renal complications (HCC)   Essential hypertension   Morbid obesity (HCC)   OSA (obstructive sleep apnea)   Acute respiratory failure with hypoxia (HCC)   Permanent atrial fibrillation   RA (rheumatoid arthritis) ([place])   Acute renal failure superimposed on stage 5 chronic kidney disease, not on chronic dialysis ([place])  Acute on chronic diastolic heart failure: Presented with shortness of breath, peripheral edema.  Echocardiogram on 1/19 showed ejection fraction of 55%.  Started on aggressive diuresis here.  Continue Lasix 120 mg IV twice daily .  She was on Lasix 80 mg twice a day at home. Continue to monitor input/output. Feels much better today.  Denies any shortness of breath.  Still has significant bilateral lower extremity edema.  Will check echocardiogram.  CKD stage IV: Baseline creatinine from 3.4-3.8.  Currently on baseline.  Continue to monitor especially when she is on Lasix  Acute respiratory with hypoxia: Found to be hypoxic on presentation.  Currently on 2 L via nasal collar.  Will wean the oxygen to off.  Permanent atrial fibrillation: Currently rate is controlled.  On warfarin, Cardizem and Coreg which we will continue.  History of  sleep apnea: Continue CPAP at night  DM type II: Last hemoglobin A1c of 5.9 in 5/19.  Currently  diet-controlled only.  Rheumatoid arthritis: Continue prednisone  Hypertension: Currently normotensive.  Morbid obesity: Body mass index of 43.4           DVT prophylaxis:heparin [place] Code Status: Full Family Communication: None present at the bedside Disposition Plan: Home after change IV Lasix to oral.   Consultants: Nephrology  Procedures: None  Antimicrobials:  Anti-infectives (From admission, onward)   None      Subjective: Patient seen and examined the bedside.  Remains comfortable.  Sitting on the chair.  Denies any shortness of breath.  She  still has bilateral lower extremity edema.  Diuresing well.  Objective: Vitals:   01/25/19 1653 01/25/19 1949 01/25/19 2323 01/26/19 0542  BP: (!) 141/68 119/80  118/65  Pulse: 77 80 79 68  Resp:  18 18 18   Temp:  98 F (36.7 C)  (!) 97 F (36.1 C)  TempSrc:  Oral    SpO2:  97% 98% 100%  Weight:    109.9 kg  Height:        Intake/Output Summary (Last 24 hours) at 01/26/2019 0949 Last data filed at 01/26/2019 7628 Gross per 24 hour  Intake 1065.48 ml  Output 1100 ml  Net -34.52 ml   Filed Weights   01/24/19 1820 01/25/19 0627 01/26/19 0542  Weight: 110.9 kg 109.4 kg 109.9 kg    Examination:  General exam: Appears calm and comfortable ,Not in distress,morbidly obese HEENT:PERRL,Oral mucosa moist, Ear/Nose normal on gross exam Respiratory system: Bilateral equal air entry, normal vesicular breath sounds, no wheezes or crackles  Cardiovascular system: S1 & S2 heard, RRR. No JVD, murmurs,  rubs, gallops or clicks. 2-3  pedal edema. Gastrointestinal system: Abdomen is nondistended, soft and nontender. No organomegaly or masses felt. Normal bowel sounds heard. Central nervous system: Alert and oriented. No focal neurological deficits. Extremities: 2-3 + edema  On bilateral lower extremities, no clubbing ,no cyanosis, distal peripheral pulses palpable. Skin: No rashes, lesions or ulcers,no icterus ,no pallor MSK:  Normal muscle bulk,tone ,power Psychiatry: Judgement and insight appear normal. Mood & affect appropriate.     Data Reviewed: I have personally reviewed following labs and imaging studies  CBC: Recent Labs  Lab 01/24/19 1530 01/25/19 0425  WBC 10.8* 10.5  HGB 10.5* 9.7*  HCT 37.7 33.5*  MCV 95.7 94.1  PLT 195 195   Basic Metabolic Panel: Recent Labs  Lab 01/24/19 1530 01/25/19 0425  NA 142 143  K 4.3 4.1  CL 101 102  CO2 26 29  GLUCOSE 119* 93  BUN 52* 51*  CREATININE 3.88* 3.78*  CALCIUM 8.2* 8.4*   GFR: Estimated Creatinine Clearance: 15.9 mL/min (A) (by C-G formula based on SCr of 3.78 mg/dL (H)). Liver Function Tests: Recent Labs  Lab 01/24/19 1530  AST 26  ALT 28  ALKPHOS 72  BILITOT 0.8  PROT 6.2*  ALBUMIN 3.4*   Recent Labs  Lab 01/24/19 1530  LIPASE 33   No results for input(s): AMMONIA in the last 168 hours. Coagulation Profile: Recent Labs  Lab 01/24/19 1532 01/25/19 0425  INR 1.56 1.49   Cardiac Enzymes: No results for input(s): CKTOTAL, CKMB, CKMBINDEX, TROPONINI in the last 168 hours. BNP (last 3 results) No results for input(s): PROBNP in the last 8760 hours. HbA1C: No results for input(s): HGBA1C in the last 72 hours. CBG: No results for input(s): GLUCAP in the last 168 hours. Lipid Profile: No results for input(s): CHOL, HDL, LDLCALC, TRIG, CHOLHDL, LDLDIRECT in the last 72 hours. Thyroid Function Tests: No results for input(s): TSH, T4TOTAL, FREET4, T3FREE, THYROIDAB in the last 72 hours. Anemia Panel: No results for input(s): VITAMINB12, FOLATE, FERRITIN, TIBC, IRON, RETICCTPCT in the last 72 hours. Sepsis Labs: No results for input(s): PROCALCITON, LATICACIDVEN in the last 168 hours.  No results found for this or any previous visit (from the past 240 hour(s)).       Radiology Studies: Dg Chest 2 View  Result Date: 01/24/2019 CLINICAL DATA:  Dyspnea on exertion. EXAM: CHEST - 2 VIEW COMPARISON:  Radiographs of  September 24, 2018. FINDINGS: Stable cardiomegaly. No pneumothorax is noted. Stable loculated effusions are noted in the right minor and major fissures. Minimal left basilar atelectasis or scarring is noted. Bony thorax is unremarkable. IMPRESSION: Minimal left basilar atelectasis or scarring. Stable loculated effusions in right minor and major fissures. No other significant abnormality is noted. Electronically Signed   By: Marijo Conception, M.D.   On: 01/24/2019 12:15        Scheduled Meds: . acidophilus  1 capsule Oral QHS  . atorvastatin  20 mg Oral Daily  . brimonidine  1 drop Both Eyes BID  . calcium-vitamin D  1 tablet Oral BID  . carvedilol  25 mg Oral BID WC  . diltiazem  120 mg Oral Daily  . heparin  5,000 Units Subcutaneous Q8H  . hydrALAZINE  50 mg Oral TID  . latanoprost  1 drop Both Eyes QHS  . leflunomide  10 mg Oral Daily  . magnesium oxide  400 mg Oral BID  . pantoprazole  40 mg Oral Daily  . potassium chloride SA  20 mEq Oral Daily  . predniSONE  5 mg Oral Daily  . sodium chloride flush  3 mL Intravenous Q12H  . timolol  1 drop Both Eyes BID  . vitamin B-12  1,000 mcg Oral Daily  . Warfarin - Pharmacist Dosing Inpatient   Does not apply q1800   Continuous Infusions: . sodium chloride    . furosemide 120 mg (01/26/19 0914)     LOS: 2 days    Time spent:35 mins. More than 50% of that time was spent in counseling and/or coordination of care.      Shelly Coss, MD [facility] Pager [phone]  If 7PM-7AM, please contact night-coverage [email or web address] Password [facility] 01/26/2019, 9:49 AM

## 2019-01-26 NOTE — Progress Notes (Signed)
Placed patient on CPAP for HS via FFM, 15.0 cm H20 (previous home settings) with 2 lpm O2 bleed in. RN aware.

## 2019-01-26 NOTE — Progress Notes (Signed)
  Echocardiogram 2D Echocardiogram has been performed.  Melissa Bartlett 01/26/2019, 2:58 PM

## 2019-01-26 NOTE — Progress Notes (Signed)
[facility] Progress Note    Assessment/ Plan:   73 y.o. female with DM dCHF w/ preserved LV function HTN OSA obesity and CKDIV BL Cr ~3.4-3.8 followed by Dr. Marval Regal. Right BCF placed by Dr. Oneida Alar 07/14/2018 + elev 01/03/2019. Referred from [facility] for dyspnea, increased weights -> IV Lasix 120 mg BID; at home she's on Lasix 80mg  PO BID.   1. 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 I would hold off on the addition of metolazone at this time as long as she is rseponding to IV diuresis. I also counseled her about how to get the 3rd dose of Lasix in at home earlier in the day to avoid issues with frequent urination at night leading to insomnia. - She is fairly careful with her Na intake. - Fluid restriction to 1.2L / 24hr. - Strict I&O's -> based on her clinical response I am more apt to believe the daily weights. If so then she's not that far off her "EDW." Before all the fluid accumulation her presumed EDW was ~236 lbs (currently 242.2 lbs on a floor scale). - Fortunately no indication to initiate dialysis esp given  fistula only superficialized 01/03/2018.  - I would continue the IV diuresis for at least another 1-2 days pending chem panel results as well. Clinically she is improved. 2. Respiratory distress w/ hypoxia - improved oxygenation and much more comfortable today. 3. CHF w/ preserved EF - certainly renal failure is not helping but fortunately responding to diuresis at this point with stable renal function. 4. DM 5. HTN 6. Afib - rate controlled 7. OSA  Subjective:   Feels that her breathing is much improved. No fever/ c/n/v/d. No CP or abd pain.  No events overnight.   Objective:   BP 118/65 (BP Location: Left Arm)   Pulse 68   Temp (!) 97 F (36.1 C)   Resp 18   Ht 5' 2.5" (1.588 m)   Wt 109.9 kg Comment: scale a  LMP  (LMP Unknown)   SpO2 100%   BMI 43.59 kg/m   Intake/Output Summary (Last 24 hours) at 01/26/2019 1151 Last  data filed at 01/26/2019 3559 Gross per 24 hour  Intake 1065.48 ml  Output 1100 ml  Net -34.52 ml   Weight change: -3.084 kg  Physical Exam: GEN: NAD, A&Ox3, NCAT HEENT: No conjunctival pallor, EOMI NECK: Supple, no thyromegaly LUNGS: CTA B/L no rales, rhonchi or wheezing CV: RRR, No M/R/G ABD: SNDNT +BS  EXT: 1-2+  extremity edema GU: No foley   Imaging: Dg Chest 2 View  Result Date: 01/24/2019 CLINICAL DATA:  Dyspnea on exertion. EXAM: CHEST - 2 VIEW COMPARISON:  Radiographs of September 24, 2018. FINDINGS: Stable cardiomegaly. No pneumothorax is noted. Stable loculated effusions are noted in the right minor and major fissures. Minimal left basilar atelectasis or scarring is noted. Bony thorax is unremarkable. IMPRESSION: Minimal left basilar atelectasis or scarring. Stable loculated effusions in right minor and major fissures. No other significant abnormality is noted. Electronically Signed   By: Marijo Conception, M.D.   On: 01/24/2019 12:15    Labs: BMET Recent Labs  Lab 01/24/19 1530 01/25/19 0425  NA 142 143  K 4.3 4.1  CL 101 102  CO2 26 29  GLUCOSE 119* 93  BUN 52* 51*  CREATININE 3.88* 3.78*  CALCIUM 8.2* 8.4*   CBC Recent Labs  Lab 01/24/19 1530 01/25/19 0425  WBC 10.8* 10.5  HGB 10.5*  9.7*  HCT 37.7 33.5*  MCV 95.7 94.1  PLT 195 166    Medications:    . acidophilus  1 capsule Oral QHS  . atorvastatin  20 mg Oral Daily  . brimonidine  1 drop Both Eyes BID  . calcium-vitamin D  1 tablet Oral BID  . carvedilol  25 mg Oral BID WC  . diltiazem  120 mg Oral Daily  . heparin  5,000 Units Subcutaneous Q8H  . hydrALAZINE  50 mg Oral TID  . latanoprost  1 drop Both Eyes QHS  . leflunomide  10 mg Oral Daily  . magnesium oxide  400 mg Oral BID  . pantoprazole  40 mg Oral Daily  . potassium chloride SA  20 mEq Oral Daily  . predniSONE  5 mg Oral Daily  . sodium chloride flush  3 mL Intravenous Q12H  . timolol  1 drop Both Eyes BID  . vitamin B-12   1,000 mcg Oral Daily  . Warfarin - Pharmacist Dosing Inpatient   Does not apply q1800      Otelia Santee, MD 01/26/2019, 11:51 AM

## 2019-01-26 NOTE — Progress Notes (Signed)
Pt had 10 beats of V tach. Pt asymptomatic. MD paged. Will also pass onto day shift RN.

## 2019-01-26 NOTE — Plan of Care (Signed)
  Problem: Safety: Goal: Ability to remain free from injury will improve Outcome: Progressing   

## 2019-01-26 NOTE — Progress Notes (Signed)
[place] for Warfarin Indication: atrial fibrillation  Allergies  Allergen Reactions  . Enalapril Other (See Comments)    Systemic reaction - jerking  . Codeine Itching    Patient Measurements: Height: 5' 2.5" (158.8 cm) Weight: 242 lb 3.2 oz (109.9 kg)(scale a) IBW/kg (Calculated) : 51.25  Vital Signs: Temp: 98.1 F (36.7 C) (01/29 1213) Temp Source: Oral (01/29 1213) BP: 111/62 (01/29 1213) Pulse Rate: 80 (01/29 1213)  Labs: Recent Labs    01/24/19 1530 01/24/19 1532 01/25/19 0425  HGB 10.5*  --  9.7*  HCT 37.7  --  33.5*  PLT 195  --  166  LABPROT  --  18.5* 17.8*  INR  --  1.56 1.49  CREATININE 3.88*  --  3.78*    Estimated Creatinine Clearance: 15.9 mL/min (A) (by C-G formula based on SCr of 3.78 mg/dL (H)).   Medical History: Past Medical History:  Diagnosis Date  . Anemia   . CHF (congestive heart failure) ([place])   . CKD (chronic kidney disease), stage IV ([place])   . Complication of anesthesia    " I aspirated during my gallbladder surgery"  . Diabetes ([place])   . GERD (gastroesophageal reflux disease)   . Heart murmur   . HTN (hypertension)   . Morbid obesity with BMI of 45.0-49.9, adult ([place])   . OSA (obstructive sleep apnea)    with AHI 11/hr with nocturnal hypoxemia , uses cpap  . PAF (paroxysmal atrial fibrillation) ([place])    s/p DCCV in 8/17  . Pneumonia   . RA (rheumatoid arthritis) ([place])   . Wears glasses   . Wears partial dentures      Assessment: 73 yo female presents with progressively worsening generalized weakness for 2 weeks. Pharmacy consulted for warfarin dosing for atrial fibrillation.  INR was subtherapeutic yesterday at 1.49- on concurrent subQ heparin. Missed dose 1/27- unclear why. Hgb 9.7, plt 166. No s/sx of bleeding.  Unable to get INR today per phlebotomy (states 4 different people trid to draw and were unable)  Home warfarin dose is 5 mg po qday  Goal of Therapy:  INR  2-3  Plan:  Give warfarin 5 mg po x 1 Monitor daily INR, CBC, clinical course, s/sx of bleed, PO intake, DDI   Thank you for allowing [place] to participate in this patients care.   Jens Som, PharmD Please utilize Amion (under [place]) for appropriate number for your unit pharmacist. 01/26/2019 1:19 PM

## 2019-01-27 ENCOUNTER — Encounter (HOSPITAL_COMMUNITY): Payer: Medicare Other

## 2019-01-27 LAB — BASIC METABOLIC PANEL
Anion gap: 13 (ref 5–15)
BUN: 57 mg/dL — ABNORMAL HIGH (ref 8–23)
CO2: 28 mmol/L (ref 22–32)
Calcium: 8.4 mg/dL — ABNORMAL LOW (ref 8.9–10.3)
Chloride: 104 mmol/L (ref 98–111)
Creatinine, Ser: 3.77 mg/dL — ABNORMAL HIGH (ref 0.44–1.00)
GFR calc Af Amer: 13 mL/min — ABNORMAL LOW (ref >60)
GFR calc non Af Amer: 11 mL/min — ABNORMAL LOW (ref >60)
Glucose, Bld: 80 mg/dL (ref 70–99)
Potassium: 4.1 mmol/L (ref 3.5–5.1)
Sodium: 145 mmol/L (ref 135–145)

## 2019-01-27 LAB — PROTIME-INR
INR: 1.75
Prothrombin Time: 20.2 seconds — ABNORMAL HIGH (ref 11.4–15.2)

## 2019-01-27 MED ORDER — WARFARIN SODIUM 5 MG PO TABS
5.0000 mg | ORAL_TABLET | Freq: Once | ORAL | Status: AC
  Administered 2019-01-27: 5 mg via ORAL
  Filled 2019-01-27: qty 1

## 2019-01-27 NOTE — Progress Notes (Signed)
Unable to flush left forearm iv infiltrated. Pt difficult stick. Requested iv team to replace iv.

## 2019-01-27 NOTE — Progress Notes (Signed)
[facility] for Warfarin Indication: atrial fibrillation  Allergies  Allergen Reactions  . Enalapril Other (See Comments)    Systemic reaction - jerking  . Codeine Itching    Patient Measurements: Height: 5' 2.5" (158.8 cm) Weight: 239 lb 3.2 oz (108.5 kg)(scale a) IBW/kg (Calculated) : 51.25  Vital Signs: Temp: 98.2 F (36.8 C) (01/30 0730) Temp Source: Oral (01/30 0730) BP: 129/80 (01/30 0730) Pulse Rate: 69 (01/30 0730)  Labs: Recent Labs    01/24/19 1530  01/25/19 0425 01/26/19 1413 01/27/19 0444  HGB 10.5*  --  9.7* 10.3*  --   HCT 37.7  --  33.5* 35.7*  --   PLT 195  --  166 211  --   LABPROT  --    < > 17.8* 16.9* 20.2*  INR  --    < > 1.49 1.39 1.75  CREATININE 3.88*  --  3.78* 3.82* 3.77*   < > = values in this interval not displayed.    Estimated Creatinine Clearance: 15.8 mL/min (A) (by C-G formula based on SCr of 3.77 mg/dL (H)).   Medical History: Past Medical History:  Diagnosis Date  . Anemia   . CHF (congestive heart failure) ([place])   . CKD (chronic kidney disease), stage IV ([place])   . Complication of anesthesia    " I aspirated during my gallbladder surgery"  . Diabetes ([place])   . GERD (gastroesophageal reflux disease)   . Heart murmur   . HTN (hypertension)   . Morbid obesity with BMI of 45.0-49.9, adult ([place])   . OSA (obstructive sleep apnea)    with AHI 11/hr with nocturnal hypoxemia , uses cpap  . PAF (paroxysmal atrial fibrillation) ([place])    s/p DCCV in 8/17  . Pneumonia   . RA (rheumatoid arthritis) ([place])   . Wears glasses   . Wears partial dentures      Assessment: 73 yo female presents with progressively worsening generalized weakness for 2 weeks. Pharmacy consulted for warfarin dosing for atrial fibrillation.  INR is subtherapeutic at 1.75 today- on concurrent subQ heparin. Missed warfarin dose 1/27- unclear why. CBC is stable. No s/sx of bleeding.   Home warfarin dose is 5 mg po  qday  Goal of Therapy:  INR 2-3  Plan:  Give warfarin 5 mg po x 1 again today Monitor daily INR, CBC, clinical course, s/sx of bleed, PO intake, DDI    Thank you for allowing [place] to participate in this patients care.   Sloan Leiter, PharmD, BCPS, BCCCP Clinical Pharmacist Please refer to [facility] for [place] numbers 01/27/2019 8:36 AM

## 2019-01-27 NOTE — Progress Notes (Signed)
[facility] Progress Note    Assessment/ Plan:   73 y.o.femalewith DM dCHF w/ preserved LV function HTN OSA obesity and CKDIV BL Cr ~3.4-3.8 followed by Dr. Marval Regal. Right BCF placed by Dr. Oneida Alar 07/14/2018 + elev 01/03/2019. Referred from [facility] for dyspnea, increased weights -> IV Lasix 120 mg BID; at home she's on Lasix 80mg  PO BID.   1. 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 I would hold off on the addition of metolazone at this time as long as she is rseponding to IV diuresis. I also counseled her about how to get the 3rd dose of Lasix in at home earlier in the day to avoid issues with frequent urination at night leading to insomnia. - She is fairly careful with her Na intake. - Fluid restriction to 1.2L / 24hr. - Strict I&O's -> based on her clinical response I am more apt to believe the daily weights. If so then she's not that far off her "EDW." Before all the fluid accumulation her presumed EDW was ~236 lbs (currently 239.2 lbs on a floor scale) but actual EDW will be below 236 lbs. - Fortunately no indication to initiate dialysis esp given  fistula only superficialized 01/03/2018.   - I would continue the IV diuresis for at least another 1-2 days pending chem panel results as well. Clinically she is improved and stable renal function but there is still  significant LE edema  2. Respiratory distress w/ hypoxia - improved oxygenation and much more comfortable today. 3. CHF w/ preserved EF - certainly renal failure is not helping but fortunately responding to diuresis at this point with stable renal function. 4. DM 5. HTN 6. Afib - rate controlled 7. OSA  Subjective:   Feels that her breathing is much improved. No fever/ c/n/v/d. No CP or abd pain.  No events overnight. OOB to chair   Objective:   BP 120/62 (BP Location: Right Arm)   Pulse 92   Temp 98.4 F (36.9 C) (Oral)   Resp 18   Ht 5' 2.5" (1.588 m)   Wt 108.5 kg  Comment: scale a  LMP  (LMP Unknown)   SpO2 94%   BMI 43.05 kg/m   Intake/Output Summary (Last 24 hours) at 01/27/2019 1238 Last data filed at 01/27/2019 1100 Gross per 24 hour  Intake 993 ml  Output 3025 ml  Net -2032 ml   Weight change: -1.361 kg  Physical Exam: GEN: NAD, A&Ox3, NCAT HEENT: No conjunctival pallor, EOMI NECK: Supple, no thyromegaly LUNGS: CTA B/L no rales, rhonchi or wheezing CV: RRR, No M/R/G ABD: SNDNT +BS  EXT: 1-2+ extremity edema GU: No foley ACCESS: RUA BCF (elevation) good bruit  Imaging: No results found.  Labs: BMET Recent Labs  Lab 01/24/19 1530 01/25/19 0425 01/26/19 1413 01/27/19 0444  NA 142 143 144 145  K 4.3 4.1 4.2 4.1  CL 101 102 103 104  CO2 26 29 29 28   GLUCOSE 119* 93 73 80  BUN 52* 51* 57* 57*  CREATININE 3.88* 3.78* 3.82* 3.77*  CALCIUM 8.2* 8.4* 8.8* 8.4*   CBC Recent Labs  Lab 01/24/19 1530 01/25/19 0425 01/26/19 1413  WBC 10.8* 10.5 9.2  NEUTROABS  --   --  6.3  HGB 10.5* 9.7* 10.3*  HCT 37.7 33.5* 35.7*  MCV 95.7 94.1 94.7  PLT 195 166 211    Medications:    . acidophilus  1 capsule Oral QHS  . atorvastatin  20 mg Oral Daily  . brimonidine  1 drop Both Eyes BID  . calcium-vitamin D  1 tablet Oral BID  . carvedilol  25 mg Oral BID WC  . diltiazem  120 mg Oral Daily  . heparin  5,000 Units Subcutaneous Q8H  . hydrALAZINE  50 mg Oral TID  . latanoprost  1 drop Both Eyes QHS  . leflunomide  10 mg Oral Daily  . magnesium oxide  400 mg Oral BID  . pantoprazole  40 mg Oral Daily  . potassium chloride SA  20 mEq Oral Daily  . predniSONE  5 mg Oral Daily  . sodium chloride flush  3 mL Intravenous Q12H  . timolol  1 drop Both Eyes BID  . vitamin B-12  1,000 mcg Oral Daily  . warfarin  5 mg Oral ONCE-1800  . Warfarin - Pharmacist Dosing Inpatient   Does not apply q1800      Otelia Santee, MD 01/27/2019, 12:38 PM

## 2019-01-27 NOTE — Progress Notes (Signed)
RT NOTE:  Pt has CPAP @ bedside and will manage.

## 2019-01-27 NOTE — Progress Notes (Signed)
PROGRESS NOTE    Melissa Bartlett  QBH:[id number] DOB: 1946-10-18 DOA: 01/24/2019 PCP: Donald Prose, MD   Brief Narrative: Patient is a 73 year old female with past medical history of diastolic congestive heart failure, CKD stage IV, diabetes type 2, GERD, hypertension, OSA, paroxysmal A. fib, rheumatoid arthritis who presented with fluid overload.  Admitted for acute on chronic CHF exacerbation.  Nephrology also consulted for worsening kidney function.  Started on aggressive diuresis.  Assessment & Plan:   Principal Problem:   Acute diastolic CHF (congestive heart failure) (HCC) Active Problems:   DM (diabetes mellitus), type 2 with renal complications (HCC)   Essential hypertension   Morbid obesity (HCC)   OSA (obstructive sleep apnea)   Acute respiratory failure with hypoxia (HCC)   Permanent atrial fibrillation   RA (rheumatoid arthritis) ([place])   Acute renal failure superimposed on stage 5 chronic kidney disease, not on chronic dialysis ([place])  Acute on chronic diastolic heart failure: Presented with shortness of breath, peripheral edema.  Echocardiogram done here showed ejection fraction of more than 65% .Started on aggressive diuresis here.  Continue Lasix 120 mg IV twice daily .  She was on Lasix 80 mg twice a day at home. Continue to monitor input/output.  Having good diuresis. Feels much better today.  Denies any shortness of breath.  Still has significant bilateral lower extremity edema though.  CKD stage IV: Baseline creatinine from 3.4-3.8.  Currently on baseline.  Continue to monitor especially when she is on Lasix.  Nephrology following and recommending 1 more day of IV diuresis. She also has AV graft on the right forearm.  Acute respiratory with hypoxia: Found to be hypoxic on presentation.  Currently on room air.  Permanent atrial fibrillation: Currently rate is controlled.  On warfarin, Cardizem and Coreg which we will continue.  History of  sleep apnea: Continue  CPAP at night  DM type II: Last hemoglobin A1c of 5.9 in 5/19.  Currently diet-controlled only.  Rheumatoid arthritis: Continue prednisone  Hypertension: Currently normotensive.  Morbid obesity: Body mass index of 43.4           DVT prophylaxis:heparin [place] Code Status: Full Family Communication: None present at the bedside Disposition Plan: Home after change of  IV Lasix to oral.   Consultants: Nephrology  Procedures: None  Antimicrobials:  Anti-infectives (From admission, onward)   None      Subjective: Patient seen and examined bedside this morning.  Remains comfortable.  Hemodynamically stable.  Having good diuresis.  Still has significant bilateral lower extremity edema. Objective: Vitals:   01/27/19 0446 01/27/19 0730 01/27/19 0808 01/27/19 1142  BP: 138/62 129/80  120/62  Pulse: 74 69  91  Resp: 18 18  18   Temp: (!) 97.5 F (36.4 C) 98.2 F (36.8 C)  98.4 F (36.9 C)  TempSrc: Oral Oral  Oral  SpO2: 98% (!) 89% 91% (!) 87%  Weight: 108.5 kg     Height:        Intake/Output Summary (Last 24 hours) at 01/27/2019 1156 Last data filed at 01/27/2019 0900 Gross per 24 hour  Intake 993 ml  Output 2875 ml  Net -1882 ml   Filed Weights   01/25/19 0627 01/26/19 0542 01/27/19 0446  Weight: 109.4 kg 109.9 kg 108.5 kg    Examination:  General exam: Appears calm and comfortable ,Not in distress,obese HEENT:PERRL,Oral mucosa moist, Ear/Nose normal on gross exam Respiratory system: Bilateral equal air entry, normal vesicular breath sounds, no wheezes or crackles  Cardiovascular  system: S1 & S2 heard, RRR. No JVD, murmurs, rubs, gallops or clicks. Gastrointestinal system: Abdomen is nondistended, soft and nontender. No organomegaly or masses felt. Normal bowel sounds heard. Central nervous system: Alert and oriented. No focal neurological deficits. Extremities: 2-3 + lower extremity edema, no clubbing ,no cyanosis, distal peripheral pulses palpable.AV graft on  the right forearm Skin: No rashes, lesions or ulcers,no icterus ,no pallor MSK: Normal muscle bulk,tone ,power Psychiatry: Judgement and insight appear normal. Mood & affect appropriate.     Data Reviewed: I have personally reviewed following labs and imaging studies  CBC: Recent Labs  Lab 01/24/19 1530 01/25/19 0425 01/26/19 1413  WBC 10.8* 10.5 9.2  NEUTROABS  --   --  6.3  HGB 10.5* 9.7* 10.3*  HCT 37.7 33.5* 35.7*  MCV 95.7 94.1 94.7  PLT 195 166 161   Basic Metabolic Panel: Recent Labs  Lab 01/24/19 1530 01/25/19 0425 01/26/19 1413 01/27/19 0444  NA 142 143 144 145  K 4.3 4.1 4.2 4.1  CL 101 102 103 104  CO2 26 29 29 28   GLUCOSE 119* 93 73 80  BUN 52* 51* 57* 57*  CREATININE 3.88* 3.78* 3.82* 3.77*  CALCIUM 8.2* 8.4* 8.8* 8.4*   GFR: Estimated Creatinine Clearance: 15.8 mL/min (A) (by C-G formula based on SCr of 3.77 mg/dL (H)). Liver Function Tests: Recent Labs  Lab 01/24/19 1530  AST 26  ALT 28  ALKPHOS 72  BILITOT 0.8  PROT 6.2*  ALBUMIN 3.4*   Recent Labs  Lab 01/24/19 1530  LIPASE 33   No results for input(s): AMMONIA in the last 168 hours. Coagulation Profile: Recent Labs  Lab 01/24/19 1532 01/25/19 0425 01/26/19 1413 01/27/19 0444  INR 1.56 1.49 1.39 1.75   Cardiac Enzymes: No results for input(s): CKTOTAL, CKMB, CKMBINDEX, TROPONINI in the last 168 hours. BNP (last 3 results) No results for input(s): PROBNP in the last 8760 hours. HbA1C: No results for input(s): HGBA1C in the last 72 hours. CBG: No results for input(s): GLUCAP in the last 168 hours. Lipid Profile: No results for input(s): CHOL, HDL, LDLCALC, TRIG, CHOLHDL, LDLDIRECT in the last 72 hours. Thyroid Function Tests: No results for input(s): TSH, T4TOTAL, FREET4, T3FREE, THYROIDAB in the last 72 hours. Anemia Panel: No results for input(s): VITAMINB12, FOLATE, FERRITIN, TIBC, IRON, RETICCTPCT in the last 72 hours. Sepsis Labs: No results for input(s):  PROCALCITON, LATICACIDVEN in the last 168 hours.  No results found for this or any previous visit (from the past 240 hour(s)).       Radiology Studies: No results found.      Scheduled Meds: . acidophilus  1 capsule Oral QHS  . atorvastatin  20 mg Oral Daily  . brimonidine  1 drop Both Eyes BID  . calcium-vitamin D  1 tablet Oral BID  . carvedilol  25 mg Oral BID WC  . diltiazem  120 mg Oral Daily  . heparin  5,000 Units Subcutaneous Q8H  . hydrALAZINE  50 mg Oral TID  . latanoprost  1 drop Both Eyes QHS  . leflunomide  10 mg Oral Daily  . magnesium oxide  400 mg Oral BID  . pantoprazole  40 mg Oral Daily  . potassium chloride SA  20 mEq Oral Daily  . predniSONE  5 mg Oral Daily  . sodium chloride flush  3 mL Intravenous Q12H  . timolol  1 drop Both Eyes BID  . vitamin B-12  1,000 mcg Oral Daily  . warfarin  5 mg Oral ONCE-1800  . Warfarin - Pharmacist Dosing Inpatient   Does not apply q1800   Continuous Infusions: . sodium chloride    . furosemide 120 mg (01/27/19 0819)     LOS: 3 days    Time spent:35 mins. More than 50% of that time was spent in counseling and/or coordination of care.      Shelly Coss, MD [facility] Pager [phone]  If 7PM-7AM, please contact night-coverage [email or web address] Password [email or web address] 01/27/2019, 11:56 AM

## 2019-01-27 NOTE — Care Management Important Message (Signed)
Important Message  Patient Details  Name: Melissa Bartlett MRN: [id number] Date of Birth: 08-17-1946   Medicare Important Message Given:  Yes    Orbie Pyo 01/27/2019, 3:13 PM

## 2019-01-28 ENCOUNTER — Encounter: Payer: Medicare Other | Admitting: Family

## 2019-01-28 LAB — BASIC METABOLIC PANEL
Anion gap: 14 (ref 5–15)
BUN: 61 mg/dL — AB (ref 8–23)
CO2: 29 mmol/L (ref 22–32)
Calcium: 8.8 mg/dL — ABNORMAL LOW (ref 8.9–10.3)
Chloride: 100 mmol/L (ref 98–111)
Creatinine, Ser: 3.85 mg/dL — ABNORMAL HIGH (ref 0.44–1.00)
GFR calc Af Amer: 13 mL/min — ABNORMAL LOW (ref >60)
GFR calc non Af Amer: 11 mL/min — ABNORMAL LOW (ref >60)
Glucose, Bld: 91 mg/dL (ref 70–99)
Potassium: 4.2 mmol/L (ref 3.5–5.1)
Sodium: 143 mmol/L (ref 135–145)

## 2019-01-28 LAB — PROTIME-INR
INR: 2.32
Prothrombin Time: 25.1 seconds — ABNORMAL HIGH (ref 11.4–15.2)

## 2019-01-28 MED ORDER — METOLAZONE 5 MG PO TABS
5.0000 mg | ORAL_TABLET | Freq: Every day | ORAL | Status: DC
  Administered 2019-01-28: 5 mg via ORAL
  Filled 2019-01-28 (×2): qty 1

## 2019-01-28 MED ORDER — FUROSEMIDE 10 MG/ML IJ SOLN
160.0000 mg | Freq: Two times a day (BID) | INTRAVENOUS | Status: DC
  Administered 2019-01-28: 160 mg via INTRAVENOUS
  Filled 2019-01-28 (×2): qty 16

## 2019-01-28 MED ORDER — WARFARIN SODIUM 2 MG PO TABS
2.0000 mg | ORAL_TABLET | Freq: Once | ORAL | Status: AC
  Administered 2019-01-28: 2 mg via ORAL
  Filled 2019-01-28: qty 1

## 2019-01-28 NOTE — Progress Notes (Signed)
[facility] for Warfarin Indication: atrial fibrillation  Allergies  Allergen Reactions  . Enalapril Other (See Comments)    Systemic reaction - jerking  . Codeine Itching    Patient Measurements: Height: 5' 2.5" (158.8 cm) Weight: 238 lb 4.8 oz (108.1 kg) IBW/kg (Calculated) : 51.25  Vital Signs: Temp: 97.4 F (36.3 C) (01/31 0429) Temp Source: Oral (01/31 0429) BP: 142/75 (01/31 0429) Pulse Rate: 82 (01/31 0429)  Labs: Recent Labs    01/26/19 1413 01/27/19 0444 01/28/19 0558  HGB 10.3*  --   --   HCT 35.7*  --   --   PLT 211  --   --   LABPROT 16.9* 20.2* 25.1*  INR 1.39 1.75 2.32  CREATININE 3.82* 3.77* 3.85*    Estimated Creatinine Clearance: 15.4 mL/min (A) (by C-G formula based on SCr of 3.85 mg/dL (H)).   Medical History: Past Medical History:  Diagnosis Date  . Anemia   . CHF (congestive heart failure) ([place])   . CKD (chronic kidney disease), stage IV ([place])   . Complication of anesthesia    " I aspirated during my gallbladder surgery"  . Diabetes ([place])   . GERD (gastroesophageal reflux disease)   . Heart murmur   . HTN (hypertension)   . Morbid obesity with BMI of 45.0-49.9, adult ([place])   . OSA (obstructive sleep apnea)    with AHI 11/hr with nocturnal hypoxemia , uses cpap  . PAF (paroxysmal atrial fibrillation) ([place])    s/p DCCV in 8/17  . Pneumonia   . RA (rheumatoid arthritis) ([place])   . Wears glasses   . Wears partial dentures      Assessment: 73 yo female presents with progressively worsening generalized weakness for 2 weeks. Pharmacy consulted for warfarin dosing for atrial fibrillation.  INR is therapeutic at 2.32 today but significant increase in 24 hours of 0.57 points- on concurrent subQ heparin. Will discontinue subQ heparin now that INR is therapeutic. CBC is stable. No s/sx of bleeding.  Per last anticoagulation clinic note, INR was 5.11 1/21 on regimen of 5mg  daily and patient was told to  hold dose and had been on hold until admission so this was too high a dose for this patient. Will lower dose significantly today as expect INR to rise further and reassess upon INR in AM. No significant drug interactions noted.   Goal of Therapy:  INR 2-3  Plan:  Give warfarin 2 mg po x 1 today (much lower dose) Monitor daily INR, CBC, clinical course, s/sx of bleed, PO intake, DDI    Thank you for allowing [place] to participate in this patients care.   Sloan Leiter, PharmD, BCPS, BCCCP Clinical Pharmacist Please refer to [facility] for [place] numbers 01/28/2019 10:42 AM

## 2019-01-28 NOTE — Progress Notes (Signed)
Melissa Bartlett Progress Note    Assessment/ Plan:   73 y.o.femalewith DM dCHF w/ preserved LV function HTN OSA obesity and CKDIV BL Cr ~3.4-3.8 followed by Dr. Marval Regal. Right BCF placed by Dr. Oneida Alar 07/14/2018 + elev 01/03/2019. Referred from [facility] for dyspnea, increased weights -> at home she's on Lasix 80mg  PO BID. Getting IV lasix high dose here and diuresing, down 10 lbs now.  1. CKD IV - close to baseline Cr of 3.4-3.8 but with vol overload. Doing well, needs more fluid off, ^lasix 160 bid and give zaroxolyn qam. She has been counseled her about how to get the 3rd dose of Lasix in at home earlier in the day to avoid issues with frequent urination at night leading to insomnia. - She is fairly careful with her Na intake. - Fluid restriction to 1.2L per day or preferable less - Fortunately no indication to initiate dialysis esp given  fistula only superficialized 01/03/2018.   2. Respiratory distress w/ hypoxia - improved oxygenation and much more comfortable today. 3. CHF w/ preserved EF - certainly renal failure is not helping but fortunately responding to diuresis at this point with stable renal function. 4. DM 5. HTN 6. Afib - rate controlled 7. OSA  Subjective:   Continues to diurese. Breathing stable. OOB to chair   Objective:   BP (!) 142/75   Pulse 82   Temp (!) 97.4 F (36.3 C) (Oral)   Resp 18   Ht 5' 2.5" (1.588 m)   Wt 108.1 kg   LMP  (LMP Unknown)   SpO2 96%   BMI 42.89 kg/m   Intake/Output Summary (Last 24 hours) at 01/28/2019 0944 Last data filed at 01/28/2019 0429 Gross per 24 hour  Intake 965.74 ml  Output 1450 ml  Net -484.26 ml   Weight change: -0.408 kg  Physical Exam: GEN: NAD, A&Ox3, NCAT HEENT: No conjunctival pallor, EOMI NECK: Supple, no thyromegaly LUNGS: CTA B/L no rales, rhonchi or wheezing CV: RRR, No M/R/G ABD: SNDNT +BS  EXT: bilat LE 2+ extremity edema GU: No foley ACCESS: RUA BCF (elevation) good bruit  Imaging: No  results found.  Labs: BMET Recent Labs  Lab 01/24/19 1530 01/25/19 0425 01/26/19 1413 01/27/19 0444 01/28/19 0558  NA 142 143 144 145 143  K 4.3 4.1 4.2 4.1 4.2  CL 101 102 103 104 100  CO2 26 29 29 28 29   GLUCOSE 119* 93 73 80 91  BUN 52* 51* 57* 57* 61*  CREATININE 3.88* 3.78* 3.82* 3.77* 3.85*  CALCIUM 8.2* 8.4* 8.8* 8.4* 8.8*   CBC Recent Labs  Lab 01/24/19 1530 01/25/19 0425 01/26/19 1413  WBC 10.8* 10.5 9.2  NEUTROABS  --   --  6.3  HGB 10.5* 9.7* 10.3*  HCT 37.7 33.5* 35.7*  MCV 95.7 94.1 94.7  PLT 195 166 211    Medications:    . acidophilus  1 capsule Oral QHS  . atorvastatin  20 mg Oral Daily  . brimonidine  1 drop Both Eyes BID  . calcium-vitamin D  1 tablet Oral BID  . carvedilol  25 mg Oral BID WC  . diltiazem  120 mg Oral Daily  . heparin  5,000 Units Subcutaneous Q8H  . hydrALAZINE  50 mg Oral TID  . latanoprost  1 drop Both Eyes QHS  . leflunomide  10 mg Oral Daily  . magnesium oxide  400 mg Oral BID  . pantoprazole  40 mg Oral Daily  . potassium chloride SA  20 mEq Oral Daily  . predniSONE  5 mg Oral Daily  . sodium chloride flush  3 mL Intravenous Q12H  . timolol  1 drop Both Eyes BID  . vitamin B-12  1,000 mcg Oral Daily  . Warfarin - Pharmacist Dosing Inpatient   Does not apply q1800     Kelly Splinter MD [facility] pgr [phone]   01/28/2019, 9:47 AM

## 2019-01-28 NOTE — Progress Notes (Signed)
PROGRESS NOTE    Melissa Bartlett  CWC:[id number] DOB: [birth year] DOA: 01/24/2019 PCP: Donald Prose, MD   Brief Narrative: Patient is a 73 year old female with past medical history of diastolic congestive heart failure, CKD stage IV, diabetes type 2, GERD, hypertension, OSA, paroxysmal A. fib, rheumatoid arthritis who presented with fluid overload.  Admitted for acute on chronic CHF exacerbation.  Nephrology also consulted for worsening kidney function.  Started on aggressive diuresis.  Assessment & Plan:   Principal Problem:   Acute diastolic CHF (congestive heart failure) (HCC) Active Problems:   DM (diabetes mellitus), type 2 with renal complications (HCC)   Essential hypertension   Morbid obesity (HCC)   OSA (obstructive sleep apnea)   Acute respiratory failure with hypoxia (HCC)   Permanent atrial fibrillation   RA (rheumatoid arthritis) ([place])   Acute renal failure superimposed on stage 5 chronic kidney disease, not on chronic dialysis ([place])  Acute on chronic diastolic heart failure: Presented with shortness of breath, peripheral edema.  Echocardiogram done here showed ejection fraction of more than 65% .Started on aggressive diuresis here.  Continue Lasix 120 mg IV twice daily .  She was on Lasix 80 mg twice a day at home. Continue to monitor input/output.  Having good diuresis.  But the weight has remained stable which might be around measurement She atill has significant bilateral lower extremity edema , might have slightly improved since yesterday.  Still complains of shortness of breath on mild exertion.  Denies any shortness of breath at rest.  CKD stage IV: Baseline creatinine from 3.4-3.8.  Currently on baseline.  Continue to monitor especially when she is on Lasix.  Nephrology following and recommending 1 more day of IV diuresis. She also has AV graft on the right forearm.  Acute respiratory with hypoxia: Found to be hypoxic on presentation.  Back on oxygen again after 1  reading showed decreased saturation.  We will monitor her on room air.  Permanent atrial fibrillation: Currently rate is controlled.  On warfarin, Cardizem and Coreg which we will continue.  History of  sleep apnea: Continue CPAP at night  DM type II: Last hemoglobin A1c of 5.9 in 5/19.  Currently diet-controlled only.  Rheumatoid arthritis: Continue prednisone  Hypertension: Currently normotensive.  Morbid obesity: Body mass index of 43.4           DVT prophylaxis:heparin [place] Code Status: Full Family Communication: None present at the bedside Disposition Plan: Home after change of  IV Lasix to oral and nephrology clearance   Consultants: Nephrology  Procedures: None  Antimicrobials:  Anti-infectives (From admission, onward)   None      Subjective: Patient seen and examined the bedside this morning.  Remains comfortable.  Hemodynamically stable.  Still complains of shortness of breath while going to the bathroom.  Lower extremity edema might have slightly improved today.    Objective: Vitals:   01/27/19 1216 01/27/19 1910 01/28/19 0429 01/28/19 0431  BP:  (!) 135/96 (!) 142/75   Pulse: 92 68 82   Resp:  18    Temp:  97.7 F (36.5 C) (!) 97.4 F (36.3 C)   TempSrc:  Oral Oral   SpO2: 94% 96% 96%   Weight:    108.1 kg  Height:        Intake/Output Summary (Last 24 hours) at 01/28/2019 1054 Last data filed at 01/28/2019 1008 Gross per 24 hour  Intake 1205.74 ml  Output 1450 ml  Net -244.26 ml   [employer]  01/26/19 0542 01/27/19 0446 01/28/19 0431  Weight: 109.9 kg 108.5 kg 108.1 kg    Examination:   General exam: Appears calm and comfortable ,Not in distress,obese HEENT:PERRL,Oral mucosa moist, Ear/Nose normal on gross exam Respiratory system: Bilateral equal air entry, normal vesicular breath sounds, no wheezes or crackles  Cardiovascular system: S1 & S2 heard, RRR. No JVD, murmurs, rubs, gallops or clicks. Gastrointestinal system: Abdomen is  nondistended, soft and nontender. No organomegaly or masses felt. Normal bowel sounds heard. Central nervous system: Alert and oriented. No focal neurological deficits. Extremities: Lower extremity edema, no clubbing ,no cyanosis, distal peripheral pulses palpable.AV graft on the right arm Skin: No rashes, lesions or ulcers,no icterus ,no pallor MSK: Normal muscle bulk,tone ,power Psychiatry: Judgement and insight appear normal. Mood & affect appropriate.     Data Reviewed: I have personally reviewed following labs and imaging studies  CBC: Recent Labs  Lab 01/24/19 1530 01/25/19 0425 01/26/19 1413  WBC 10.8* 10.5 9.2  NEUTROABS  --   --  6.3  HGB 10.5* 9.7* 10.3*  HCT 37.7 33.5* 35.7*  MCV 95.7 94.1 94.7  PLT 195 166 361   Basic Metabolic Panel: Recent Labs  Lab 01/24/19 1530 01/25/19 0425 01/26/19 1413 01/27/19 0444 01/28/19 0558  NA 142 143 144 145 143  K 4.3 4.1 4.2 4.1 4.2  CL 101 102 103 104 100  CO2 26 29 29 28 29   GLUCOSE 119* 93 73 80 91  BUN 52* 51* 57* 57* 61*  CREATININE 3.88* 3.78* 3.82* 3.77* 3.85*  CALCIUM 8.2* 8.4* 8.8* 8.4* 8.8*   GFR: Estimated Creatinine Clearance: 15.4 mL/min (A) (by C-G formula based on SCr of 3.85 mg/dL (H)). Liver Function Tests: Recent Labs  Lab 01/24/19 1530  AST 26  ALT 28  ALKPHOS 72  BILITOT 0.8  PROT 6.2*  ALBUMIN 3.4*   Recent Labs  Lab 01/24/19 1530  LIPASE 33   No results for input(s): AMMONIA in the last 168 hours. Coagulation Profile: Recent Labs  Lab 01/24/19 1532 01/25/19 0425 01/26/19 1413 01/27/19 0444 01/28/19 0558  INR 1.56 1.49 1.39 1.75 2.32   Cardiac Enzymes: No results for input(s): CKTOTAL, CKMB, CKMBINDEX, TROPONINI in the last 168 hours. BNP (last 3 results) No results for input(s): PROBNP in the last 8760 hours. HbA1C: No results for input(s): HGBA1C in the last 72 hours. CBG: No results for input(s): GLUCAP in the last 168 hours. Lipid Profile: No results for input(s):  CHOL, HDL, LDLCALC, TRIG, CHOLHDL, LDLDIRECT in the last 72 hours. Thyroid Function Tests: No results for input(s): TSH, T4TOTAL, FREET4, T3FREE, THYROIDAB in the last 72 hours. Anemia Panel: No results for input(s): VITAMINB12, FOLATE, FERRITIN, TIBC, IRON, RETICCTPCT in the last 72 hours. Sepsis Labs: No results for input(s): PROCALCITON, LATICACIDVEN in the last 168 hours.  No results found for this or any previous visit (from the past 240 hour(s)).       Radiology Studies: No results found.      Scheduled Meds: . acidophilus  1 capsule Oral QHS  . atorvastatin  20 mg Oral Daily  . brimonidine  1 drop Both Eyes BID  . calcium-vitamin D  1 tablet Oral BID  . carvedilol  25 mg Oral BID WC  . diltiazem  120 mg Oral Daily  . hydrALAZINE  50 mg Oral TID  . latanoprost  1 drop Both Eyes QHS  . leflunomide  10 mg Oral Daily  . magnesium oxide  400 mg Oral BID  .  metolazone  5 mg Oral Daily  . pantoprazole  40 mg Oral Daily  . potassium chloride SA  20 mEq Oral Daily  . predniSONE  5 mg Oral Daily  . sodium chloride flush  3 mL Intravenous Q12H  . timolol  1 drop Both Eyes BID  . vitamin B-12  1,000 mcg Oral Daily  . warfarin  2 mg Oral ONCE-1800  . Warfarin - Pharmacist Dosing Inpatient   Does not apply q1800   Continuous Infusions: . sodium chloride    . furosemide       LOS: 4 days    Time spent:35 mins. More than 50% of that time was spent in counseling and/or coordination of care.      Shelly Coss, MD [facility] Pager [phone]  If 7PM-7AM, please contact night-coverage [email or web address] Password [facility] 01/28/2019, 10:54 AM

## 2019-01-29 ENCOUNTER — Inpatient Hospital Stay (HOSPITAL_COMMUNITY): Payer: Medicare Other

## 2019-01-29 LAB — BASIC METABOLIC PANEL WITH GFR
Anion gap: 15 (ref 5–15)
BUN: 65 mg/dL — ABNORMAL HIGH (ref 8–23)
CO2: 29 mmol/L (ref 22–32)
Calcium: 9.1 mg/dL (ref 8.9–10.3)
Chloride: 98 mmol/L (ref 98–111)
Creatinine, Ser: 4.11 mg/dL — ABNORMAL HIGH (ref 0.44–1.00)
GFR calc Af Amer: 12 mL/min — ABNORMAL LOW
GFR calc non Af Amer: 10 mL/min — ABNORMAL LOW
Glucose, Bld: 91 mg/dL (ref 70–99)
Potassium: 3.8 mmol/L (ref 3.5–5.1)
Sodium: 142 mmol/L (ref 135–145)

## 2019-01-29 LAB — PROTIME-INR
INR: 2.77
Prothrombin Time: 28.9 seconds — ABNORMAL HIGH (ref 11.4–15.2)

## 2019-01-29 MED ORDER — WARFARIN SODIUM 2 MG PO TABS
2.0000 mg | ORAL_TABLET | Freq: Once | ORAL | Status: AC
  Administered 2019-01-29: 2 mg via ORAL
  Filled 2019-01-29: qty 1

## 2019-01-29 MED ORDER — FUROSEMIDE 80 MG PO TABS
80.0000 mg | ORAL_TABLET | Freq: Two times a day (BID) | ORAL | Status: DC
  Administered 2019-01-29: 80 mg via ORAL
  Filled 2019-01-29: qty 1

## 2019-01-29 NOTE — Plan of Care (Signed)
  Problem: Education: Goal: Knowledge of General Education information will improve Description Including pain rating scale, medication(s)/side effects and non-pharmacologic comfort measures Outcome: Progressing   Problem: Health Behavior/Discharge Planning: Goal: Ability to manage health-related needs will improve Outcome: Progressing   Problem: Clinical Measurements: Goal: Will remain free from infection Outcome: Progressing Goal: Diagnostic test results will improve Outcome: Progressing Goal: Respiratory complications will improve Outcome: Progressing Goal: Cardiovascular complication will be avoided Outcome: Progressing   Problem: Activity: Goal: Risk for activity intolerance will decrease Outcome: Progressing   Problem: Nutrition: Goal: Adequate nutrition will be maintained Outcome: Progressing   Problem: Coping: Goal: Level of anxiety will decrease Outcome: Progressing   Problem: Elimination: Goal: Will not experience complications related to urinary retention Outcome: Progressing   Problem: Pain Managment: Goal: General experience of comfort will improve Outcome: Progressing   Problem: Safety: Goal: Ability to remain free from injury will improve Outcome: Progressing

## 2019-01-29 NOTE — Progress Notes (Addendum)
[facility] NEPHROLOGY PROGRESS NOTE  Assessment/ Plan: Pt is a 73 y.o. yo female with DM, CHF with preserved LV function, hypertension, OSA, CKD stage IV with baseline creatinine around 3.4-3.8 followed by Dr. Myna Hidalgo at [facility], right BCF placed by Dr. Oneida Alar 07/14/2018+elev 01/03/2019, admitted with dyspnea and CHF exacerbation.  #CKD stage IV/V: elevated creatinine level likely hemodynamically mediated in the setting of diuresis. Patient has been receiving Lasix IV and lost around 6 kg since admission.  She is nonoliguric.  Switch Lasix to p.o. 80 mg twice a day which is her home dose.  Depending on repeat lab in the morning she will probably need higher dose of Lasix.  Recommend daily weight, strict ins and out, fluid and salt restriction. -No indication for dialysis.  #Acute respiratory failure with hypoxia: She is now able to lie flat, oxygen saturation improving.  #CHF with preserved EF: Diuretics as above.  #Hypertension: Blood pressure acceptable.  Continue current medication.  Discussed with the primary team.  Subjective: Seen and examined at bedside.  Reports her breathing is better.  Denied chest pain, headache, dizziness, cough, abdominal pain.  No urinary complaint. Objective Vital signs in last 24 hours: Vitals:   01/28/19 2015 01/29/19 0430 01/29/19 0433 01/29/19 0856  BP: 135/66  (!) 142/71 139/70  Pulse: 91  90 83  Resp:   20   Temp: 98.1 F (36.7 C)  97.7 F (36.5 C)   TempSrc: Oral  Oral   SpO2: 93%  91% 97%  Weight:  106.8 kg    Height:       Weight change: -1.27 kg  Intake/Output Summary (Last 24 hours) at 01/29/2019 0903 Last data filed at 01/29/2019 0600 Gross per 24 hour  Intake 840 ml  Output 1600 ml  Net -760 ml       Labs: Basic Metabolic Panel: Recent Labs  Lab 01/27/19 0444 01/28/19 0558 01/29/19 0602  NA 145 143 142  K 4.1 4.2 3.8  CL 104 100 98  CO2 28 29 29   GLUCOSE 80 91 91  BUN 57* 61* 65*  CREATININE 3.77* 3.85*  4.11*  CALCIUM 8.4* 8.8* 9.1   Liver Function Tests: Recent Labs  Lab 01/24/19 1530  AST 26  ALT 28  ALKPHOS 72  BILITOT 0.8  PROT 6.2*  ALBUMIN 3.4*   Recent Labs  Lab 01/24/19 1530  LIPASE 33   No results for input(s): AMMONIA in the last 168 hours. CBC: Recent Labs  Lab 01/24/19 1530 01/25/19 0425 01/26/19 1413  WBC 10.8* 10.5 9.2  NEUTROABS  --   --  6.3  HGB 10.5* 9.7* 10.3*  HCT 37.7 33.5* 35.7*  MCV 95.7 94.1 94.7  PLT 195 166 211   Cardiac Enzymes: No results for input(s): CKTOTAL, CKMB, CKMBINDEX, TROPONINI in the last 168 hours. CBG: No results for input(s): GLUCAP in the last 168 hours.  Iron Studies: No results for input(s): IRON, TIBC, TRANSFERRIN, FERRITIN in the last 72 hours. Studies/Results: No results found.  Medications: Infusions: . sodium chloride      Scheduled Medications: . acidophilus  1 capsule Oral QHS  . atorvastatin  20 mg Oral Daily  . brimonidine  1 drop Both Eyes BID  . calcium-vitamin D  1 tablet Oral BID  . carvedilol  25 mg Oral BID WC  . diltiazem  120 mg Oral Daily  . furosemide  80 mg Oral BID  . hydrALAZINE  50 mg Oral TID  . latanoprost  1 drop Both  Eyes QHS  . leflunomide  10 mg Oral Daily  . magnesium oxide  400 mg Oral BID  . pantoprazole  40 mg Oral Daily  . potassium chloride SA  20 mEq Oral Daily  . predniSONE  5 mg Oral Daily  . sodium chloride flush  3 mL Intravenous Q12H  . timolol  1 drop Both Eyes BID  . vitamin B-12  1,000 mcg Oral Daily  . warfarin  2 mg Oral ONCE-1800  . Warfarin - Pharmacist Dosing Inpatient   Does not apply q1800    have reviewed scheduled and prn medications.  Physical Exam: General:NAD, comfortable Heart:RRR, s1s2 nl Lungs:clear b/l, no crackle Abdomen:soft, Non-tender, non-distended Extremities:No edema Dialysis Access: RUA BCF (elevation) good bruit  Fina Heizer Prasad Audra Kagel 01/29/2019,9:03 AM  LOS: 5 days

## 2019-01-29 NOTE — Progress Notes (Signed)
Pt placed on CPAP for the night- tolerating well. 

## 2019-01-29 NOTE — Progress Notes (Signed)
PROGRESS NOTE    Melissa Bartlett  MGQ:[id number] DOB: 1946/01/08 DOA: 01/24/2019 PCP: Donald Prose, MD   Brief Narrative: Patient is a 73 year old female with past medical history of diastolic congestive heart failure, CKD stage IV, diabetes type 2, GERD, hypertension, OSA, paroxysmal A. fib, rheumatoid arthritis who presented with fluid overload.  Admitted for acute on chronic CHF exacerbation.  Nephrology also consulted for worsening kidney function.  Started on aggressive diuresis.  Assessment & Plan:   Principal Problem:   Acute diastolic CHF (congestive heart failure) (HCC) Active Problems:   DM (diabetes mellitus), type 2 with renal complications (HCC)   Essential hypertension   Morbid obesity (HCC)   OSA (obstructive sleep apnea)   Acute respiratory failure with hypoxia (HCC)   Permanent atrial fibrillation   RA (rheumatoid arthritis) ([place])   Acute renal failure superimposed on stage 5 chronic kidney disease, not on chronic dialysis ([place])  Acute on chronic diastolic heart failure: Presented with shortness of breath, peripheral edema.  Echocardiogram done here showed ejection fraction of more than 65% .Started on aggressive diuresis here.  Was on  Lasix 120 mg IV twice daily .  She was on Lasix 80 mg twice a day at home.  Due to worsening kidney function, her home Lasix dose has been restarted, 80 mg twice a day. Continue to monitor input/output.  Having good diuresis.   She still has significant bilateral lower extremity edema , might have slightly improved since yesterday.  Will order TED hose.  AKI on CKD stage IV: Baseline creatinine from 3.4-3.8.  Creatinine trended up today.  Continue to monitor especially when she is on Lasix.  Nephrology following. Lasix stopped. She also has AV graft on the right forearm.  Acute respiratory with hypoxia: Found to be hypoxic on presentation.  Back on oxygen again after 1 reading showed decreased saturation.  We will monitor her on room  air.  Permanent atrial fibrillation: Currently rate is controlled.  On warfarin, Cardizem and Coreg which we will continue.  History of  sleep apnea: Continue CPAP at night  DM type II: Last hemoglobin A1c of 5.9 in 5/19.  Currently diet-controlled only.  Rheumatoid arthritis: Continue prednisone  Hypertension: Currently normotensive.  Morbid obesity: Body mass index of 43.4           DVT prophylaxis:heparin [place] Code Status: Full Family Communication: None present at the bedside Disposition Plan: Home in 1-2 days  Consultants: Nephrology  Procedures: None  Antimicrobials:  Anti-infectives (From admission, onward)   None      Subjective: Patient seen and examined the bedside this morning.  Remains comfortable.  Hemodynamically stable.  Still Has bilateral lower extremity edema. Objective: Vitals:   01/28/19 2015 01/29/19 0430 01/29/19 0433 01/29/19 0856  BP: 135/66  (!) 142/71 139/70  Pulse: 91  90 83  Resp:   20   Temp: 98.1 F (36.7 C)  97.7 F (36.5 C)   TempSrc: Oral  Oral   SpO2: 93%  91% 97%  Weight:  106.8 kg    Height:        Intake/Output Summary (Last 24 hours) at 01/29/2019 0930 Last data filed at 01/29/2019 0600 Gross per 24 hour  Intake 840 ml  Output 1600 ml  Net -760 ml   [employer] Weights   01/27/19 0446 01/28/19 0431 01/29/19 0430  Weight: 108.5 kg 108.1 kg 106.8 kg    Examination:   General exam: Appears calm and comfortable ,Not in distress,obese HEENT:PERRL,Oral mucosa moist, Ear/Nose  normal on gross exam Respiratory system: Bilateral equal air entry, normal vesicular breath sounds, no wheezes or crackles  Cardiovascular system: S1 & S2 heard, RRR. No JVD, murmurs, rubs, gallops or clicks. Gastrointestinal system: Abdomen is nondistended, soft and nontender. No organomegaly or masses felt. Normal bowel sounds heard. Central nervous system: Alert and oriented. No focal neurological deficits. Extremities: 2-3 + edema on bilateral lower  extremities., no clubbing ,no cyanosis, distal peripheral pulses palpable.AV graft on the right arm. Skin: No rashes, lesions or ulcers,no icterus ,no pallor MSK: Normal muscle bulk,tone ,power Psychiatry: Judgement and insight appear normal. Mood & affect appropriate.    Data Reviewed: I have personally reviewed following labs and imaging studies  CBC: Recent Labs  Lab 01/24/19 1530 01/25/19 0425 01/26/19 1413  WBC 10.8* 10.5 9.2  NEUTROABS  --   --  6.3  HGB 10.5* 9.7* 10.3*  HCT 37.7 33.5* 35.7*  MCV 95.7 94.1 94.7  PLT 195 166 536   Basic Metabolic Panel: Recent Labs  Lab 01/25/19 0425 01/26/19 1413 01/27/19 0444 01/28/19 0558 01/29/19 0602  NA 143 144 145 143 142  K 4.1 4.2 4.1 4.2 3.8  CL 102 103 104 100 98  CO2 29 29 28 29 29   GLUCOSE 93 73 80 91 91  BUN 51* 57* 57* 61* 65*  CREATININE 3.78* 3.82* 3.77* 3.85* 4.11*  CALCIUM 8.4* 8.8* 8.4* 8.8* 9.1   GFR: Estimated Creatinine Clearance: 14.4 mL/min (A) (by C-G formula based on SCr of 4.11 mg/dL (H)). Liver Function Tests: Recent Labs  Lab 01/24/19 1530  AST 26  ALT 28  ALKPHOS 72  BILITOT 0.8  PROT 6.2*  ALBUMIN 3.4*   Recent Labs  Lab 01/24/19 1530  LIPASE 33   No results for input(s): AMMONIA in the last 168 hours. Coagulation Profile: Recent Labs  Lab 01/25/19 0425 01/26/19 1413 01/27/19 0444 01/28/19 0558 01/29/19 0602  INR 1.49 1.39 1.75 2.32 2.77   Cardiac Enzymes: No results for input(s): CKTOTAL, CKMB, CKMBINDEX, TROPONINI in the last 168 hours. BNP (last 3 results) No results for input(s): PROBNP in the last 8760 hours. HbA1C: No results for input(s): HGBA1C in the last 72 hours. CBG: No results for input(s): GLUCAP in the last 168 hours. Lipid Profile: No results for input(s): CHOL, HDL, LDLCALC, TRIG, CHOLHDL, LDLDIRECT in the last 72 hours. Thyroid Function Tests: No results for input(s): TSH, T4TOTAL, FREET4, T3FREE, THYROIDAB in the last 72 hours. Anemia Panel: No  results for input(s): VITAMINB12, FOLATE, FERRITIN, TIBC, IRON, RETICCTPCT in the last 72 hours. Sepsis Labs: No results for input(s): PROCALCITON, LATICACIDVEN in the last 168 hours.  No results found for this or any previous visit (from the past 240 hour(s)).       Radiology Studies: No results found.      Scheduled Meds: . acidophilus  1 capsule Oral QHS  . atorvastatin  20 mg Oral Daily  . brimonidine  1 drop Both Eyes BID  . calcium-vitamin D  1 tablet Oral BID  . carvedilol  25 mg Oral BID WC  . diltiazem  120 mg Oral Daily  . furosemide  80 mg Oral BID  . hydrALAZINE  50 mg Oral TID  . latanoprost  1 drop Both Eyes QHS  . leflunomide  10 mg Oral Daily  . magnesium oxide  400 mg Oral BID  . pantoprazole  40 mg Oral Daily  . potassium chloride SA  20 mEq Oral Daily  . predniSONE  5 mg  Oral Daily  . sodium chloride flush  3 mL Intravenous Q12H  . timolol  1 drop Both Eyes BID  . vitamin B-12  1,000 mcg Oral Daily  . warfarin  2 mg Oral ONCE-1800  . Warfarin - Pharmacist Dosing Inpatient   Does not apply q1800   Continuous Infusions: . sodium chloride       LOS: 5 days    Time spent:35 mins. More than 50% of that time was spent in counseling and/or coordination of care.      Shelly Coss, MD [facility] Pager [phone]  If 7PM-7AM, please contact night-coverage [email or web address] Password [email or web address] 01/29/2019, 9:30 AM

## 2019-01-29 NOTE — Progress Notes (Signed)
SATURATION QUALIFICATIONS: (This note is used to comply with regulatory documentation for home oxygen)  Patient Saturations on Room Air at Rest = 96%  Patient Saturations on Room Air while Ambulating = 84%  Patient Saturations on 2 Liters of oxygen while Ambulating = 97%  Please briefly explain why patient needs home oxygen:

## 2019-01-29 NOTE — Progress Notes (Signed)
Patient ambulate in the hallway on room air, Patient stated that she is not short of breath but just feel very very tired.

## 2019-01-29 NOTE — Progress Notes (Signed)
[place] for Warfarin Indication: atrial fibrillation  Allergies  Allergen Reactions  . Enalapril Other (See Comments)    Systemic reaction - jerking  . Codeine Itching    Patient Measurements: Height: 5' 2.5" (158.8 cm) Weight: 235 lb 8 oz (106.8 kg) IBW/kg (Calculated) : 51.25  Vital Signs: Temp: 97.7 F (36.5 C) (02/01 0433) Temp Source: Oral (02/01 0433) BP: 142/71 (02/01 0433) Pulse Rate: 90 (02/01 0433)  Labs: Recent Labs    01/26/19 1413 01/27/19 0444 01/28/19 0558 01/29/19 0602  HGB 10.3*  --   --   --   HCT 35.7*  --   --   --   PLT 211  --   --   --   LABPROT 16.9* 20.2* 25.1* 28.9*  INR 1.39 1.75 2.32 2.77  CREATININE 3.82* 3.77* 3.85* 4.11*    Estimated Creatinine Clearance: 14.4 mL/min (A) (by C-G formula based on SCr of 4.11 mg/dL (H)).   Medical History: Past Medical History:  Diagnosis Date  . Anemia   . CHF (congestive heart failure) ([place])   . CKD (chronic kidney disease), stage IV ([place])   . Complication of anesthesia    " I aspirated during my gallbladder surgery"  . Diabetes ([place])   . GERD (gastroesophageal reflux disease)   . Heart murmur   . HTN (hypertension)   . Morbid obesity with BMI of 45.0-49.9, adult ([place])   . OSA (obstructive sleep apnea)    with AHI 11/hr with nocturnal hypoxemia , uses cpap  . PAF (paroxysmal atrial fibrillation) ([place])    s/p DCCV in 8/17  . Pneumonia   . RA (rheumatoid arthritis) ([place])   . Wears glasses   . Wears partial dentures      Assessment: 73 yo female presents with progressively worsening generalized weakness for 2 weeks. Pharmacy consulted for warfarin dosing for atrial fibrillation.  INR is therapeutic at 2.77 today but still significant increased in 24 hours of 0.45 points. CBC is stable. No s/sx of bleeding.  Per last anticoagulation clinic note, INR was 5.11  1/21 on regimen of 5mg  daily and patient was told to hold dose and had been on hold until  admission so this was too high a dose for this patient. Will continue a lower dose today as expect INR to rise further and reassess upon INR in AM. No significant drug interactions noted.   Goal of Therapy:  INR 2-3  Plan:  Give warfarin 2 mg po x 1 today  Monitor daily INR Check CBC in AM   Thank you for allowing pharmacy to be a part of this patient's care.  Leron Croak, PharmD PGY1 Pharmacy Resident Phone: [phone]  Please check AMION for all [place] phone numbers 01/29/2019 8:03 AM

## 2019-01-29 NOTE — Plan of Care (Signed)
  Problem: Education: Goal: Knowledge of General Education information will improve Description Including pain rating scale, medication(s)/side effects and non-pharmacologic comfort measures Outcome: Progressing   Problem: Clinical Measurements: Goal: Diagnostic test results will improve Outcome: Progressing Goal: Respiratory complications will improve Outcome: Progressing Goal: Cardiovascular complication will be avoided Outcome: Progressing   Problem: Activity: Goal: Risk for activity intolerance will decrease Outcome: Progressing   Problem: Nutrition: Goal: Adequate nutrition will be maintained Outcome: Progressing   Problem: Elimination: Goal: Will not experience complications related to urinary retention Outcome: Progressing   Problem: Pain Managment: Goal: General experience of comfort will improve Outcome: Progressing   Problem: Safety: Goal: Ability to remain free from injury will improve Outcome: Progressing

## 2019-01-30 LAB — RENAL FUNCTION PANEL
Albumin: 3.4 g/dL — ABNORMAL LOW (ref 3.5–5.0)
Anion gap: 14 (ref 5–15)
BUN: 69 mg/dL — ABNORMAL HIGH (ref 8–23)
CO2: 30 mmol/L (ref 22–32)
Calcium: 9.1 mg/dL (ref 8.9–10.3)
Chloride: 99 mmol/L (ref 98–111)
Creatinine, Ser: 4.49 mg/dL — ABNORMAL HIGH (ref 0.44–1.00)
GFR calc Af Amer: 11 mL/min — ABNORMAL LOW (ref >60)
GFR calc non Af Amer: 9 mL/min — ABNORMAL LOW (ref >60)
Glucose, Bld: 111 mg/dL — ABNORMAL HIGH (ref 70–99)
Phosphorus: 4.2 mg/dL (ref 2.5–4.6)
Potassium: 4.1 mmol/L (ref 3.5–5.1)
Sodium: 143 mmol/L (ref 135–145)

## 2019-01-30 LAB — PROTIME-INR
INR: 2.79
Prothrombin Time: 29 seconds — ABNORMAL HIGH (ref 11.4–15.2)

## 2019-01-30 MED ORDER — WARFARIN SODIUM 2 MG PO TABS
2.0000 mg | ORAL_TABLET | Freq: Once | ORAL | Status: AC
  Administered 2019-01-30: 2 mg via ORAL
  Filled 2019-01-30: qty 1

## 2019-01-30 MED ORDER — FUROSEMIDE 80 MG PO TABS
80.0000 mg | ORAL_TABLET | Freq: Two times a day (BID) | ORAL | Status: DC
  Administered 2019-01-31: 80 mg via ORAL
  Filled 2019-01-30: qty 1

## 2019-01-30 NOTE — Progress Notes (Signed)
Pt placed on CPAP for the night.  Tolerating well. 

## 2019-01-30 NOTE — Progress Notes (Signed)
Melissa Bartlett Progress Note    Assessment/ Plan:   1.  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  elevated creatinine level likely hemodynamically mediated in the setting of diuresis. Weights are down- pt reports her target weight at home is 236-237, she is 233 lbs today (106 kg).  Lasix being held this AM, would restart home dose of 80 BID and if Cr is stable tomorrow could potentially discharge with close f/u with [facility].  Had superficialization of fistula 01/03/2019 so hopefully can hold off on the initiation of HD until able to use it.  2.Acute respiratory failure with hypoxia: Resolved, off O2 with diuresis  3.  CHF with preserved EF: Diuretics as above.  4  Hypertension: Blood pressure acceptable.  Continue current medication.  Subjective:    Feeling better- Cr up to 4.4, Weight down to `106.2 kg.  Lasix held this AM.  CXR yesterday with improved aeration and improved fluid.   Objective:   BP (!) 152/84 (BP Location: Left Arm)   Pulse 82   Temp 97.8 F (36.6 C)   Resp 18   Ht 5' 2.5" (1.588 m)   Wt 106.2 kg   LMP  (LMP Unknown)   SpO2 98%   BMI 42.15 kg/m   Intake/Output Summary (Last 24 hours) at 01/30/2019 0932 Last data filed at 01/30/2019 0600 Gross per 24 hour  Intake 600 ml  Output 1850 ml  Net -1250 ml   Weight change: -0.59 kg  Physical Exam: Gen: NAD, sitting on side of bed CVS: RRR Resp: clear bilaterally, off O2 Abd: obese Ext: TED hose in place, 2+ LE edema which pt states is much improved  Imaging: Dg Chest 1 View  Result Date: 01/29/2019 CLINICAL DATA:  Shortness of breath.  Follow-up exam. EXAM: CHEST  1 VIEW COMPARISON:  01/24/2019. FINDINGS: Mild enlargement of the cardiopericardial silhouette, stable. No mediastinal or hilar masses. Fluid in the minor fissure has decreased compared to the prior exam. Left basilar atelectasis has resolved. Lungs are otherwise clear. No convincing pleural effusion. No pneumothorax. Skeletal structures  are grossly intact. IMPRESSION: 1. No acute cardiopulmonary disease. 2. Resolved left basilar atelectasis. Improved right minor fissure loculated fluid. 3. Stable, mild cardiomegaly. Electronically Signed   By: Lajean Manes M.D.   On: 01/29/2019 12:32    Labs: BMET Recent Labs  Lab 01/24/19 1530 01/25/19 0425 01/26/19 1413 01/27/19 0444 01/28/19 0558 01/29/19 0602 01/30/19 0607  NA 142 143 144 145 143 142 143  K 4.3 4.1 4.2 4.1 4.2 3.8 4.1  CL 101 102 103 104 100 98 99  CO2 26 29 29 28 29 29 30   GLUCOSE 119* 93 73 80 91 91 111*  BUN 52* 51* 57* 57* 61* 65* 69*  CREATININE 3.88* 3.78* 3.82* 3.77* 3.85* 4.11* 4.49*  CALCIUM 8.2* 8.4* 8.8* 8.4* 8.8* 9.1 9.1  PHOS  --   --   --   --   --   --  4.2   CBC Recent Labs  Lab 01/24/19 1530 01/25/19 0425 01/26/19 1413  WBC 10.8* 10.5 9.2  NEUTROABS  --   --  6.3  HGB 10.5* 9.7* 10.3*  HCT 37.7 33.5* 35.7*  MCV 95.7 94.1 94.7  PLT 195 166 211    Medications:    . acidophilus  1 capsule Oral QHS  . atorvastatin  20 mg Oral Daily  . brimonidine  1 drop Both Eyes BID  . calcium-vitamin D  1 tablet Oral BID  .  carvedilol  25 mg Oral BID WC  . diltiazem  120 mg Oral Daily  . hydrALAZINE  50 mg Oral TID  . latanoprost  1 drop Both Eyes QHS  . leflunomide  10 mg Oral Daily  . magnesium oxide  400 mg Oral BID  . pantoprazole  40 mg Oral Daily  . potassium chloride SA  20 mEq Oral Daily  . predniSONE  5 mg Oral Daily  . sodium chloride flush  3 mL Intravenous Q12H  . timolol  1 drop Both Eyes BID  . vitamin B-12  1,000 mcg Oral Daily  . Warfarin - Pharmacist Dosing Inpatient   Does not apply [place], MD [facility] pgr [phone] Cell [phone] 01/30/2019, 9:32 AM

## 2019-01-30 NOTE — Progress Notes (Signed)
[place] for Warfarin Indication: atrial fibrillation  Allergies  Allergen Reactions  . Enalapril Other (See Comments)    Systemic reaction - jerking  . Codeine Itching    Patient Measurements: Height: 5' 2.5" (158.8 cm) Weight: 234 lb 3.2 oz (106.2 kg) IBW/kg (Calculated) : 51.25  Vital Signs: Temp: 97.8 F (36.6 C) (02/02 0538) BP: 152/84 (02/02 0538) Pulse Rate: 82 (02/02 0538)  Labs: Recent Labs    01/28/19 0558 01/29/19 0602 01/30/19 0607  LABPROT 25.1* 28.9* 29.0*  INR 2.32 2.77 2.79  CREATININE 3.85* 4.11* 4.49*    Estimated Creatinine Clearance: 13.1 mL/min (A) (by C-G formula based on SCr of 4.49 mg/dL (H)).   Medical History: Past Medical History:  Diagnosis Date  . Anemia   . CHF (congestive heart failure) ([place])   . CKD (chronic kidney disease), stage IV ([place])   . Complication of anesthesia    " I aspirated during my gallbladder surgery"  . Diabetes ([place])   . GERD (gastroesophageal reflux disease)   . Heart murmur   . HTN (hypertension)   . Morbid obesity with BMI of 45.0-49.9, adult ([place])   . OSA (obstructive sleep apnea)    with AHI 11/hr with nocturnal hypoxemia , uses cpap  . PAF (paroxysmal atrial fibrillation) ([place])    s/p DCCV in 8/17  . Pneumonia   . RA (rheumatoid arthritis) ([place])   . Wears glasses   . Wears partial dentures      Assessment: 73 yo female presents with progressively worsening generalized weakness for 2 weeks. Pharmacy consulted for warfarin dosing for atrial fibrillation.  INR is therapeutic at 2.79 today, with much less of a significant increase compared to previous 2 days. No s/sx of bleeding.  Per last anticoagulation clinic note, INR was 5.11  1/21 on regimen of 5mg  daily and patient was told to hold dose and had been on hold until admission so this was too high a dose for this patient. No significant drug interactions noted.   Goal of Therapy:  INR 2-3  Plan:  Give  warfarin 2 mg po x 1 today  Monitor daily INR   Thank you for allowing pharmacy to be a part of this patient's care.  Leron Croak, PharmD PGY1 Pharmacy Resident Phone: [phone]  Please check AMION for all [place] phone numbers 01/30/2019 10:27 AM

## 2019-01-30 NOTE — Progress Notes (Addendum)
PROGRESS NOTE    Melissa Bartlett  YWV:[id number] DOB: [birth year] DOA: 01/24/2019 PCP: Donald Prose, MD   Brief Narrative: Patient is a 73 year old female with past medical history of diastolic congestive heart failure, CKD stage IV, diabetes type 2, GERD, hypertension, OSA, paroxysmal A. fib, rheumatoid arthritis who presented with fluid overload.  Admitted for acute on chronic CHF exacerbation.  Nephrology also consulted for worsening kidney function.  Started on aggressive diuresis but had to be stopped because of worsening kidney function today.  Assessment & Plan:   Principal Problem:   Acute diastolic CHF (congestive heart failure) (HCC) Active Problems:   DM (diabetes mellitus), type 2 with renal complications (HCC)   Essential hypertension   Morbid obesity (HCC)   OSA (obstructive sleep apnea)   Acute respiratory failure with hypoxia (HCC)   Permanent atrial fibrillation   RA (rheumatoid arthritis) ([place])   Acute renal failure superimposed on stage 5 chronic kidney disease, not on chronic dialysis ([place])  Acute on chronic diastolic heart failure: Presented with shortness of breath, peripheral edema.  Echocardiogram done here showed ejection fraction of more than 65% .Started on aggressive diuresis here.  Was on  Lasix 120 mg IV twice daily .  She was on Lasix 80 mg twice a day at home.  Due to worsening kidney function, Lasix will be stopped today. .Continue to monitor input/output.  Having good diuresis.  She has lost about 15 pounds since admission. She still has  bilateral lower extremity edema , might have slightly improved since yesterday.  Continue TED hose.  AKI on CKD stage IV: Baseline creatinine from 3.4-3.8.  Creatinine trended up today to 4.49.  Nephrology following. Lasix stopped. She also has AV graft on the right forearm.  Acute respiratory with hypoxia: Found to be hypoxic on presentation.  Currently on room air.  Permanent atrial fibrillation: Currently rate is  controlled.  On warfarin, Cardizem and Coreg which we will continue.  History of  sleep apnea: Continue CPAP at night  DM type II: Last hemoglobin A1c of 5.9 in 5/19.  Currently diet-controlled only.  Rheumatoid arthritis: Continue prednisone  Hypertension: Currently normotensive.  Morbid obesity: Body mass index of 43.4         DVT prophylaxis:heparin [place] Code Status: Full Family Communication: None present at the bedside Disposition Plan: Home tomorrow if kidney function improves  Consultants: Nephrology  Procedures: None  Antimicrobials:  Anti-infectives (From admission, onward)   None      Subjective:  Patient seen and examined the bedside this morning.  Hemodynamically stable.  Remains comfortable.  Lower extremity edema might have improved since yesterday.  Still complains of shortness of breath on minimal exertion  Objective: Vitals:   01/29/19 0940 01/29/19 1139 01/29/19 2125 01/30/19 0538  BP:  122/68 114/60 (!) 152/84  Pulse:  69 76 82  Resp:  20 18 18   Temp:  98 F (36.7 C) 98 F (36.7 C) 97.8 F (36.6 C)  TempSrc:  Oral Oral   SpO2: (!) 86% 100% 91% 98%  Weight:    106.2 kg  Height:        Intake/Output Summary (Last 24 hours) at 01/30/2019 0847 Last data filed at 01/30/2019 0600 Gross per 24 hour  Intake 840 ml  Output 1850 ml  Net -1010 ml   Filed Weights   01/28/19 0431 01/29/19 0430 01/30/19 0538  Weight: 108.1 kg 106.8 kg 106.2 kg    Examination:   General exam: Appears calm and comfortable ,Not  in distress,obese HEENT:PERRL,Oral mucosa moist, Ear/Nose normal on gross exam Respiratory system: Bilateral equal air entry, normal vesicular breath sounds, no wheezes or crackles  Cardiovascular system: S1 & S2 heard, RRR. No JVD, murmurs, rubs, gallops or clicks. Gastrointestinal system: Abdomen is nondistended, soft and nontender. No organomegaly or masses felt. Normal bowel sounds heard. Central nervous system: Alert and oriented. No  focal neurological deficits. Extremities: 1-2+ edema on the bilateral lower extremities, no clubbing ,no cyanosis, distal peripheral pulses palpable.  AV graft on the right Skin: No rashes, lesions or ulcers,no icterus ,no pallor MSK: Normal muscle bulk,tone ,power Psychiatry: Judgement and insight appear normal. Mood & affect appropriate.     Data Reviewed: I have personally reviewed following labs and imaging studies  CBC: Recent Labs  Lab 01/24/19 1530 01/25/19 0425 01/26/19 1413  WBC 10.8* 10.5 9.2  NEUTROABS  --   --  6.3  HGB 10.5* 9.7* 10.3*  HCT 37.7 33.5* 35.7*  MCV 95.7 94.1 94.7  PLT 195 166 878   Basic Metabolic Panel: Recent Labs  Lab 01/26/19 1413 01/27/19 0444 01/28/19 0558 01/29/19 0602 01/30/19 0607  NA 144 145 143 142 143  K 4.2 4.1 4.2 3.8 4.1  CL 103 104 100 98 99  CO2 29 28 29 29 30   GLUCOSE 73 80 91 91 111*  BUN 57* 57* 61* 65* 69*  CREATININE 3.82* 3.77* 3.85* 4.11* 4.49*  CALCIUM 8.8* 8.4* 8.8* 9.1 9.1  PHOS  --   --   --   --  4.2   GFR: Estimated Creatinine Clearance: 13.1 mL/min (A) (by C-G formula based on SCr of 4.49 mg/dL (H)). Liver Function Tests: Recent Labs  Lab 01/24/19 1530 01/30/19 0607  AST 26  --   ALT 28  --   ALKPHOS 72  --   BILITOT 0.8  --   PROT 6.2*  --   ALBUMIN 3.4* 3.4*   Recent Labs  Lab 01/24/19 1530  LIPASE 33   No results for input(s): AMMONIA in the last 168 hours. Coagulation Profile: Recent Labs  Lab 01/26/19 1413 01/27/19 0444 01/28/19 0558 01/29/19 0602 01/30/19 0607  INR 1.39 1.75 2.32 2.77 2.79   Cardiac Enzymes: No results for input(s): CKTOTAL, CKMB, CKMBINDEX, TROPONINI in the last 168 hours. BNP (last 3 results) No results for input(s): PROBNP in the last 8760 hours. HbA1C: No results for input(s): HGBA1C in the last 72 hours. CBG: No results for input(s): GLUCAP in the last 168 hours. Lipid Profile: No results for input(s): CHOL, HDL, LDLCALC, TRIG, CHOLHDL, LDLDIRECT in the  last 72 hours. Thyroid Function Tests: No results for input(s): TSH, T4TOTAL, FREET4, T3FREE, THYROIDAB in the last 72 hours. Anemia Panel: No results for input(s): VITAMINB12, FOLATE, FERRITIN, TIBC, IRON, RETICCTPCT in the last 72 hours. Sepsis Labs: No results for input(s): PROCALCITON, LATICACIDVEN in the last 168 hours.  No results found for this or any previous visit (from the past 240 hour(s)).       Radiology Studies: Dg Chest 1 View  Result Date: 01/29/2019 CLINICAL DATA:  Shortness of breath.  Follow-up exam. EXAM: CHEST  1 VIEW COMPARISON:  01/24/2019. FINDINGS: Mild enlargement of the cardiopericardial silhouette, stable. No mediastinal or hilar masses. Fluid in the minor fissure has decreased compared to the prior exam. Left basilar atelectasis has resolved. Lungs are otherwise clear. No convincing pleural effusion. No pneumothorax. Skeletal structures are grossly intact. IMPRESSION: 1. No acute cardiopulmonary disease. 2. Resolved left basilar atelectasis. Improved right minor fissure  loculated fluid. 3. Stable, mild cardiomegaly. Electronically Signed   By: Lajean Manes M.D.   On: 01/29/2019 12:32        Scheduled Meds: . acidophilus  1 capsule Oral QHS  . atorvastatin  20 mg Oral Daily  . brimonidine  1 drop Both Eyes BID  . calcium-vitamin D  1 tablet Oral BID  . carvedilol  25 mg Oral BID WC  . diltiazem  120 mg Oral Daily  . hydrALAZINE  50 mg Oral TID  . latanoprost  1 drop Both Eyes QHS  . leflunomide  10 mg Oral Daily  . magnesium oxide  400 mg Oral BID  . pantoprazole  40 mg Oral Daily  . potassium chloride SA  20 mEq Oral Daily  . predniSONE  5 mg Oral Daily  . sodium chloride flush  3 mL Intravenous Q12H  . timolol  1 drop Both Eyes BID  . vitamin B-12  1,000 mcg Oral Daily  . Warfarin - Pharmacist Dosing Inpatient   Does not apply q1800   Continuous Infusions: . sodium chloride       LOS: 6 days    Time spent:35 mins. More than 50% of that  time was spent in counseling and/or coordination of care.      Shelly Coss, MD [facility] Pager [phone]  If 7PM-7AM, please contact night-coverage [email or web address] Password [facility] 01/30/2019, 8:47 AM

## 2019-01-31 ENCOUNTER — Other Ambulatory Visit: Payer: Self-pay

## 2019-01-31 DIAGNOSIS — I5031 Acute diastolic (congestive) heart failure: Secondary | ICD-10-CM

## 2019-01-31 LAB — BASIC METABOLIC PANEL
ANION GAP: 13 (ref 5–15)
BUN: 76 mg/dL — ABNORMAL HIGH (ref 8–23)
CO2: 31 mmol/L (ref 22–32)
Calcium: 8.9 mg/dL (ref 8.9–10.3)
Chloride: 100 mmol/L (ref 98–111)
Creatinine, Ser: 4.13 mg/dL — ABNORMAL HIGH (ref 0.44–1.00)
GFR calc Af Amer: 12 mL/min — ABNORMAL LOW (ref >60)
GFR calc non Af Amer: 10 mL/min — ABNORMAL LOW (ref >60)
Glucose, Bld: 120 mg/dL — ABNORMAL HIGH (ref 70–99)
Potassium: 4.3 mmol/L (ref 3.5–5.1)
Sodium: 144 mmol/L (ref 135–145)

## 2019-01-31 LAB — PROTIME-INR
INR: 3.03
Prothrombin Time: 30.9 seconds — ABNORMAL HIGH (ref 11.4–15.2)

## 2019-01-31 NOTE — Progress Notes (Signed)
[facility] Progress Note    Assessment/ Plan:   1.  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  elevated creatinine level likely hemodynamically mediated in the setting of diuresis. Weights are down- pt reports her target weight at home is 236-237, she is now 233 lbs (106 kg).  Restarting Lasix 80 BID.  OK for d/c with close f/u with [facility]--already has appt with Dr. Marval Regal 2/20.  Discussed sliding scale diuretics- if weight increases more than 2lbs overnight or 5lbs in 1 week take extra dose of Lasix (80 mg).  I have arranged for her to get labs with [facility] 02/07/2019.  Instructed her to call if weights creep up despite measures taken here.  Had superficialization of fistula 01/03/2019 so hopefully can hold off on the initiation of HD until able to use it.  2.Acute respiratory failure with hypoxia: Resolved, off O2 with diuresis  3.  CHF with preserved EF: Diuretics as above.  4  Hypertension: Blood pressure acceptable.  Continue current medication.  Subjective:    Cr down to 4.1.  Feeling OK, excellent UOP.  Eager for d/c today.     Objective:   BP (!) 144/89 (BP Location: Left Arm)   Pulse 64   Temp 97.6 F (36.4 C) (Oral)   Resp 18   Ht 5' 2.5" (1.588 m)   Wt 106.5 kg   LMP  (LMP Unknown)   SpO2 96%   BMI 42.24 kg/m   Intake/Output Summary (Last 24 hours) at 01/31/2019 0847 Last data filed at 01/31/2019 0600 Gross per 24 hour  Intake 480 ml  Output 2050 ml  Net -1570 ml   Weight change: 0.227 kg  Physical Exam: Gen: NAD, lying in bed and easily arousable CVS: RRR Resp: clear bilaterally, off O2 Abd: obese Ext: TED hose in place, 2+ LE edema which pt states is much improved  Imaging: Dg Chest 1 View  Result Date: 01/29/2019 CLINICAL DATA:  Shortness of breath.  Follow-up exam. EXAM: CHEST  1 VIEW COMPARISON:  01/24/2019. FINDINGS: Mild enlargement of the cardiopericardial silhouette, stable. No mediastinal or hilar masses. Fluid in the minor fissure has  decreased compared to the prior exam. Left basilar atelectasis has resolved. Lungs are otherwise clear. No convincing pleural effusion. No pneumothorax. Skeletal structures are grossly intact. IMPRESSION: 1. No acute cardiopulmonary disease. 2. Resolved left basilar atelectasis. Improved right minor fissure loculated fluid. 3. Stable, mild cardiomegaly. Electronically Signed   By: Lajean Manes M.D.   On: 01/29/2019 12:32    Labs: BMET Recent Labs  Lab 01/25/19 0425 01/26/19 1413 01/27/19 0444 01/28/19 0558 01/29/19 0602 01/30/19 0607 01/31/19 0702  NA 143 144 145 143 142 143 144  K 4.1 4.2 4.1 4.2 3.8 4.1 4.3  CL 102 103 104 100 98 99 100  CO2 29 29 28 29 29 30 31   GLUCOSE 93 73 80 91 91 111* 120*  BUN 51* 57* 57* 61* 65* 69* 76*  CREATININE 3.78* 3.82* 3.77* 3.85* 4.11* 4.49* 4.13*  CALCIUM 8.4* 8.8* 8.4* 8.8* 9.1 9.1 8.9  PHOS  --   --   --   --   --  4.2  --    CBC Recent Labs  Lab 01/24/19 1530 01/25/19 0425 01/26/19 1413  WBC 10.8* 10.5 9.2  NEUTROABS  --   --  6.3  HGB 10.5* 9.7* 10.3*  HCT 37.7 33.5* 35.7*  MCV 95.7 94.1 94.7  PLT 195 166 211    Medications:    .  acidophilus  1 capsule Oral QHS  . atorvastatin  20 mg Oral Daily  . brimonidine  1 drop Both Eyes BID  . calcium-vitamin D  1 tablet Oral BID  . carvedilol  25 mg Oral BID WC  . diltiazem  120 mg Oral Daily  . furosemide  80 mg Oral BID  . hydrALAZINE  50 mg Oral TID  . latanoprost  1 drop Both Eyes QHS  . leflunomide  10 mg Oral Daily  . magnesium oxide  400 mg Oral BID  . pantoprazole  40 mg Oral Daily  . potassium chloride SA  20 mEq Oral Daily  . predniSONE  5 mg Oral Daily  . sodium chloride flush  3 mL Intravenous Q12H  . timolol  1 drop Both Eyes BID  . vitamin B-12  1,000 mcg Oral Daily  . Warfarin - Pharmacist Dosing Inpatient   Does not apply [place], MD [facility] pgr [phone] Cell [phone] 01/31/2019, 8:47 AM

## 2019-01-31 NOTE — Progress Notes (Deleted)
CM talked to [place] with Inpatient Rehab; patient has all of her equipment at home and [facility] has been arranged with [facility]; Aneta Mins [phone]

## 2019-01-31 NOTE — Plan of Care (Signed)
  Problem: Education: Goal: Knowledge of General Education information will improve Description: Including pain rating scale, medication(s)/side effects and non-pharmacologic comfort measures Outcome: Progressing   Problem: Clinical Measurements: Goal: Ability to maintain clinical measurements within normal limits will improve Outcome: Progressing Goal: Diagnostic test results will improve Outcome: Progressing Goal: Respiratory complications will improve Outcome: Progressing Goal: Cardiovascular complication will be avoided Outcome: Progressing   Problem: Activity: Goal: Risk for activity intolerance will decrease Outcome: Progressing   Problem: Nutrition: Goal: Adequate nutrition will be maintained Outcome: Progressing   

## 2019-01-31 NOTE — Progress Notes (Signed)
[place] for Warfarin Indication: atrial fibrillation  Patient Measurements: Height: 5' 2.5" (158.8 cm) Weight: 234 lb 11.2 oz (106.5 kg) IBW/kg (Calculated) : 51.25  Vital Signs: Temp: 97.6 F (36.4 C) (02/03 0457) Temp Source: Oral (02/03 0457) BP: 144/89 (02/03 0457) Pulse Rate: 64 (02/03 0457)  Labs: Recent Labs    01/29/19 0602 01/30/19 0607 01/31/19 0702  LABPROT 28.9* 29.0* 30.9*  INR 2.77 2.79 3.03  CREATININE 4.11* 4.49* 4.13*     Medical History: Past Medical History:  Diagnosis Date  . Anemia   . CHF (congestive heart failure) ([place])   . CKD (chronic kidney disease), stage IV ([place])   . Complication of anesthesia    " I aspirated during my gallbladder surgery"  . Diabetes ([place])   . GERD (gastroesophageal reflux disease)   . Heart murmur   . HTN (hypertension)   . Morbid obesity with BMI of 45.0-49.9, adult ([place])   . OSA (obstructive sleep apnea)    with AHI 11/hr with nocturnal hypoxemia , uses cpap  . PAF (paroxysmal atrial fibrillation) ([place])    s/p DCCV in 8/17  . Pneumonia   . RA (rheumatoid arthritis) ([place])   . Wears glasses   . Wears partial dentures      Assessment: 73 yo female presents with progressively worsening generalized weakness for 2 weeks. Pharmacy consulted for warfarin dosing for atrial fibrillation.  Patient is on warfarin prior to admission at a dose of 5 mg po daily - this dose caused her INR to be elevated to 5, at her outpatient anticoagulation visit. Her INR today is 3 after being on a much lower dose of warfarin. She is not any significant interacting medications. Will need a much lower dose at discharge  Goal of Therapy:  INR 2-3  Plan:  Warfarin 1 mg po x1 Daily INR     Harvel Quale 01/31/2019 9:17 AM

## 2019-01-31 NOTE — Discharge Summary (Signed)
Physician Discharge Summary  Melissa Bartlett ZOX:[id number] DOB: 08/30/1946 DOA: 01/24/2019  PCP: Donald Prose, MD  Admit date: 01/24/2019 Discharge date: 01/31/2019  Admitted From: Home Disposition:  Home  Discharge Condition:Stable CODE STATUS:FULL Diet recommendation: Heart Healthy  Brief/Interim Summary:  Patient is a 73 year old female with past medical history of diastolic congestive heart failure, CKD stage IV, diabetes type 2, GERD, hypertension, OSA, paroxysmal A. fib, rheumatoid arthritis who presented with fluid overload.  Admitted for acute on chronic CHF exacerbation. Nephrology also consulted for worsening kidney function.  Started on aggressive diuresis .She had significant diuresis on this hospitalization and she lost significant weight from fluid removal.  She is stable for discharge to home today.  She will resume Lasix 80 mg twice a day.  She has an upcoming appointment with nephrology.  Following problems were addressed during her hospitalization:  Acute on chronic diastolic heart failure: Presented with shortness of breath, peripheral edema.  Echocardiogram done here showed ejection fraction of more than 65% .Started on aggressive diuresis here.  Was on  Lasix 120 mg IV twice daily .  She was on Lasix 80 mg twice a day at home.  Her home dose has been resumed.She had good diuresis.  She has lost significant weight  since admission. She still has  bilateral lower extremity edema , but they have  improved since admission.  Continue TED hose.  AKI on CKD stage IV: Baseline creatinine from 3.4-3.8.  Creatinine trended up in the range of 4.  Nephrology was following.Follow up with nephrology as an outpatient. She also has AV graft on the right forearm.  Acute respiratory with hypoxia:Resolved. Found to be hypoxic on presentation due to volume overload.  Currently on room air.  Permanent atrial fibrillation: Currently rate is controlled.  On warfarin, Cardizem and Coreg  which we will continue.  History of  sleep apnea: Continue CPAP at night  DM type II: Last hemoglobin A1c of 5.9 in 5/19.  Currently diet-controlled only.  Rheumatoid arthritis: Continue prednisone  Hypertension: Currently normotensive.  Morbid obesity: Body mass index of 43.4   Discharge Diagnoses:  Principal Problem:   Acute diastolic CHF (congestive heart failure) (HCC) Active Problems:   DM (diabetes mellitus), type 2 with renal complications (HCC)   Essential hypertension   Morbid obesity (HCC)   OSA (obstructive sleep apnea)   Acute respiratory failure with hypoxia (HCC)   Permanent atrial fibrillation   RA (rheumatoid arthritis) ([place])   Acute renal failure superimposed on stage 5 chronic kidney disease, not on chronic dialysis [facility])    Discharge Instructions  Discharge Instructions    Diet - low sodium heart healthy   Complete by:  As directed    Discharge instructions   Complete by:  As directed    1) Please do a BMP test in a week to check your kidney function. 2)Follow up with your nephrologist in the next appointment date. 3)Restrict fluid intake to less than 1 Litre a day.Monitor your weight. 4)Continue your home medications.   Increase activity slowly   Complete by:  As directed      Allergies as of 01/31/2019      Reactions   Enalapril Other (See Comments)   Systemic reaction - jerking   Codeine Itching      Medication List    TAKE these medications   ALIGN PO Take 1 tablet by mouth at bedtime.   atorvastatin 20 MG tablet Commonly known as:  LIPITOR Take 20 mg by mouth  daily.   CALCIUM 600/VITAMIN D 600-400 MG-UNIT Tabs Generic drug:  Calcium Carbonate-Vitamin D3 Take 1 tablet by mouth 2 (two) times daily.   carvedilol 25 MG tablet Commonly known as:  COREG TAKE 1 TABLET BY MOUTH TWICE A DAY WITH A MEAL What changed:  See the new instructions.   COMBIGAN 0.2-0.5 % ophthalmic solution Generic drug:  brimonidine-timolol Place 1  drop into both eyes 2 (two) times daily.   Darbepoetin Alfa 60 MCG/0.3ML Sosy injection Commonly known as:  ARANESP Inject 60 mcg every 30 (thirty) days into the skin.   diltiazem 120 MG 24 hr capsule Commonly known as:  CARDIZEM CD Take 120 mg by mouth daily.   diphenhydramine-acetaminophen 25-500 MG Tabs tablet Commonly known as:  TYLENOL PM Take 1 tablet by mouth at bedtime.   FEBUXOSTAT PO Take by mouth.   furosemide 20 MG tablet Commonly known as:  LASIX Take 80 mg by mouth 2 (two) times daily.   hydrALAZINE 50 MG tablet Commonly known as:  APRESOLINE Take 50 mg by mouth 3 (three) times daily.   latanoprost 0.005 % ophthalmic solution Commonly known as:  XALATAN Place 1 drop into both eyes at bedtime.   leflunomide 10 MG tablet Commonly known as:  ARAVA Take 10 mg by mouth daily.   Magnesium Oxide 400 (240 Mg) MG Tabs Take 400 mg by mouth 2 (two) times daily.   pantoprazole 40 MG tablet Commonly known as:  PROTONIX Take 1 tablet (40 mg total) by mouth daily.   potassium chloride SA 20 MEQ tablet Commonly known as:  KLOR-CON M20 Take 1 tablet (20 mEq total) by mouth daily.   predniSONE 5 MG tablet Commonly known as:  DELTASONE Take 5 mg by mouth daily.   PRESCRIPTION MEDICATION Inhale into the lungs at bedtime. CPAP   vitamin B-12 1000 MCG tablet Commonly known as:  CYANOCOBALAMIN Take 1,000 mcg by mouth daily.   warfarin 5 MG tablet Commonly known as:  COUMADIN Take as directed. If you are unsure how to take this medication, talk to your nurse or doctor. Original instructions:  Take 1 tablet (5 mg total) by mouth daily with supper.       Allergies  Allergen Reactions  . Enalapril Other (See Comments)    Systemic reaction - jerking  . Codeine Itching    Consultations:  Nephrology   Procedures/Studies: Dg Chest 1 View  Result Date: 01/29/2019 CLINICAL DATA:  Shortness of breath.  Follow-up exam. EXAM: CHEST  1 VIEW COMPARISON:   01/24/2019. FINDINGS: Mild enlargement of the cardiopericardial silhouette, stable. No mediastinal or hilar masses. Fluid in the minor fissure has decreased compared to the prior exam. Left basilar atelectasis has resolved. Lungs are otherwise clear. No convincing pleural effusion. No pneumothorax. Skeletal structures are grossly intact. IMPRESSION: 1. No acute cardiopulmonary disease. 2. Resolved left basilar atelectasis. Improved right minor fissure loculated fluid. 3. Stable, mild cardiomegaly. Electronically Signed   By: Lajean Manes M.D.   On: 01/29/2019 12:32   Dg Chest 2 View  Result Date: 01/24/2019 CLINICAL DATA:  Dyspnea on exertion. EXAM: CHEST - 2 VIEW COMPARISON:  Radiographs of September 24, 2018. FINDINGS: Stable cardiomegaly. No pneumothorax is noted. Stable loculated effusions are noted in the right minor and major fissures. Minimal left basilar atelectasis or scarring is noted. Bony thorax is unremarkable. IMPRESSION: Minimal left basilar atelectasis or scarring. Stable loculated effusions in right minor and major fissures. No other significant abnormality is noted. Electronically Signed   By:  Marijo Conception, M.D.   On: 01/24/2019 12:15       Subjective: Patient seen and examined at bedside this morning.  Remians comfortable.  Hemodynamically stable for discharge  Discharge Exam: Vitals:   01/30/19 2003 01/31/19 0457  BP: 131/88 (!) 144/89  Pulse: 88 64  Resp: 16 18  Temp: 97.7 F (36.5 C) 97.6 F (36.4 C)  SpO2: 93% 96%   Vitals:   01/30/19 0538 01/30/19 1242 01/30/19 2003 01/31/19 0457  BP: (!) 152/84 130/72 131/88 (!) 144/89  Pulse: 82 91 88 64  Resp: 18 18 16 18   Temp: 97.8 F (36.6 C) 97.6 F (36.4 C) 97.7 F (36.5 C) 97.6 F (36.4 C)  TempSrc:  Oral Oral Oral  SpO2: 98% 92% 93% 96%  Weight: 106.2 kg   106.5 kg  Height:        General: Pt is alert, awake, not in acute distress Cardiovascular: RRR, S1/S2 +, no rubs, no gallops Respiratory: CTA  bilaterally, no wheezing, no rhonchi Abdominal: Soft, NT, ND, bowel sounds + Extremities: 1-2 + pititng edema, no cyanosis    The results of significant diagnostics from this hospitalization (including imaging, microbiology, ancillary and laboratory) are listed below for reference.     Microbiology: No results found for this or any previous visit (from the past 240 hour(s)).   Labs: BNP (last 3 results) Recent Labs    09/23/18 2109 09/24/18 1520 01/24/19 1530  BNP 834.1* 708.2* 6,160.7*   Basic Metabolic Panel: Recent Labs  Lab 01/27/19 0444 01/28/19 0558 01/29/19 0602 01/30/19 0607 01/31/19 0702  NA 145 143 142 143 144  K 4.1 4.2 3.8 4.1 4.3  CL 104 100 98 99 100  CO2 28 29 29 30 31   GLUCOSE 80 91 91 111* 120*  BUN 57* 61* 65* 69* 76*  CREATININE 3.77* 3.85* 4.11* 4.49* 4.13*  CALCIUM 8.4* 8.8* 9.1 9.1 8.9  PHOS  --   --   --  4.2  --    Liver Function Tests: Recent Labs  Lab 01/24/19 1530 01/30/19 0607  AST 26  --   ALT 28  --   ALKPHOS 72  --   BILITOT 0.8  --   PROT 6.2*  --   ALBUMIN 3.4* 3.4*   Recent Labs  Lab 01/24/19 1530  LIPASE 33   No results for input(s): AMMONIA in the last 168 hours. CBC: Recent Labs  Lab 01/24/19 1530 01/25/19 0425 01/26/19 1413  WBC 10.8* 10.5 9.2  NEUTROABS  --   --  6.3  HGB 10.5* 9.7* 10.3*  HCT 37.7 33.5* 35.7*  MCV 95.7 94.1 94.7  PLT 195 166 211   Cardiac Enzymes: No results for input(s): CKTOTAL, CKMB, CKMBINDEX, TROPONINI in the last 168 hours. BNP: Invalid input(s): POCBNP CBG: No results for input(s): GLUCAP in the last 168 hours. D-Dimer No results for input(s): DDIMER in the last 72 hours. Hgb A1c No results for input(s): HGBA1C in the last 72 hours. Lipid Profile No results for input(s): CHOL, HDL, LDLCALC, TRIG, CHOLHDL, LDLDIRECT in the last 72 hours. Thyroid function studies No results for input(s): TSH, T4TOTAL, T3FREE, THYROIDAB in the last 72 hours.  Invalid input(s):  FREET3 Anemia work up No results for input(s): VITAMINB12, FOLATE, FERRITIN, TIBC, IRON, RETICCTPCT in the last 72 hours. Urinalysis    Component Value Date/Time   COLORURINE STRAW (A) 01/24/2019 [place] 01/24/2019 1645   LABSPEC 1.008 01/24/2019 1645   PHURINE 5.0 01/24/2019  [place] 01/24/2019 [place] 01/24/2019 [place] 01/24/2019 [place] 01/24/2019 1645   PROTEINUR 30 (A) 01/24/2019 1645   UROBILINOGEN 0.2 04/28/2010 0304   NITRITE NEGATIVE 01/24/2019 1645   LEUKOCYTESUR NEGATIVE 01/24/2019 1645   Sepsis Labs Invalid input(s): PROCALCITONIN,  WBC,  LACTICIDVEN Microbiology No results found for this or any previous visit (from the past 240 hour(s)).  Please note: You were cared for by a hospitalist during your hospital stay. Once you are discharged, your primary care physician will handle any further medical issues. Please note that NO REFILLS for any discharge medications will be authorized once you are discharged, as it is imperative that you return to your primary care physician (or establish a relationship with a primary care physician if you do not have one) for your post hospital discharge needs so that they can reassess your need for medications and monitor your lab values.    Time coordinating discharge: 40 minutes  SIGNED:   Shelly Coss, MD  [facility] 01/31/2019, 9:44 AM Pager [phone]  If 7PM-7AM, please contact night-coverage [email or web address] Password [email or web address]

## 2019-02-01 ENCOUNTER — Encounter: Payer: Self-pay | Admitting: *Deleted

## 2019-02-01 ENCOUNTER — Other Ambulatory Visit: Payer: Self-pay | Admitting: *Deleted

## 2019-02-01 NOTE — Patient Outreach (Signed)
[facility]) Care Management  02/01/2019  Melissa Bartlett 1946-06-29 [id number]    Telephone Assessment  RN spoke with pt and introduced the [facility] program and available services along with the purpose of today's call. Pt receptive with provider information on her ongoing health. Pt states she has plenty of paperwork concerning HF and zones. States she previous received a lot of information with the previously involved Health Coach and recent visit with the hospital liaison. RN inquired further on pt's management of care and inquired on daily weights. Pt states she weighs daily with no change and denies any symptoms once reviewed on the HF zones. States she is in the GREEN zone with no reported problems. Verified pt has "alittle" swelling to her LE however continues to improve. Denies any other symptoms at this time. RN offered to review her medications (pt receptive). No discrepancies found and pt verifies enough supplies. Further educated pt on the HF zones with the importance of recognizing if she gains 3 lbs overnight or 5 lbs within one week of fluid retention and what to do if acute symptoms are encountered. Pt reports a support spouse in the home and she gets up several times throughout the day and ambulates down the halls in her home. Pt active in managing her health and receptive to any education or information to assist. RN offered a face-to face and to follow up telephonically with managing her HF. Pt receptive to the telephone calls and will alert RN if and when she may need a face-to-face home visit however receptive to telephonic case management at this time. RN generated and discussed a care plan related to her HF at this time with discussed goals and interventions. Pt receptive and willing to participate with the discussed goals and interventions.  Pt having work done on her home (a new furnace) and it became different to heard. RN offered to services and agrees with the  plan of care. RN will follow up next week and obtain additional telephone assessment and update the plan of care. Will also alert pt's provider that she is participating in the [facility] program and services and provider today's assessment.   THN CM Care Plan Problem One     Most Recent Value  Care Plan Problem One  Knowledge deficit related to HF  Role Documenting the Problem One  Care Management Coordinator  Care Plan for Problem One  Active  THN Long Term Goal   Pt will not have any admission related to HF over the next 90 days based upon her increased knowledge base provided on the HF zones.   THN Long Term Goal Start Date  02/01/19  Interventions for Problem One Long Term Goal  Will educate on the HF zone and verified pt remains in the GREEN zone and aware when to seek medical attention and contact her provider.   THN CM Short Term Goal #1   Pt will adhere to all medical appointments post-op discharge over the next 30 days.  THN CM Short Term Goal #1 Start Date  02/01/19  Interventions for Short Term Goal #1  Will verify sufficient transportation and validate all upcoming appointments. WIll strongly encouraged pt to adhere if unable to attend to contact the provider and rescheduled the pending appointment to avoid "NO SHOW" fees.  THN CM Short Term Goal #2   Adherence with post-op medications over the next 30d ays.  THN CM Short Term Goal #2 Start Date  02/01/19  Interventions for  Short Term Goal #2  Will review and educate on all discharge medications and verify pt has sufficient dosages as prescribed. Will also encouraged pt on supplies and needed refills during her upcoming office visits to avoid delays with her local pharmacy.       Raina Mina, RN Care Management Coordinator [place] Office [phone]

## 2019-02-02 ENCOUNTER — Encounter: Payer: Medicare Other | Admitting: Physician Assistant

## 2019-02-02 ENCOUNTER — Encounter: Payer: Self-pay | Admitting: Family

## 2019-02-03 ENCOUNTER — Other Ambulatory Visit: Payer: Self-pay | Admitting: Cardiology

## 2019-02-07 ENCOUNTER — Telehealth: Payer: Self-pay | Admitting: Cardiology

## 2019-02-07 ENCOUNTER — Ambulatory Visit (INDEPENDENT_AMBULATORY_CARE_PROVIDER_SITE_OTHER): Payer: Medicare Other

## 2019-02-07 DIAGNOSIS — Z7901 Long term (current) use of anticoagulants: Secondary | ICD-10-CM | POA: Diagnosis not present

## 2019-02-07 DIAGNOSIS — I4821 Permanent atrial fibrillation: Secondary | ICD-10-CM | POA: Diagnosis not present

## 2019-02-07 DIAGNOSIS — N185 Chronic kidney disease, stage 5: Secondary | ICD-10-CM | POA: Diagnosis not present

## 2019-02-07 DIAGNOSIS — I480 Paroxysmal atrial fibrillation: Secondary | ICD-10-CM

## 2019-02-07 DIAGNOSIS — Z7952 Long term (current) use of systemic steroids: Secondary | ICD-10-CM | POA: Diagnosis not present

## 2019-02-07 DIAGNOSIS — I5033 Acute on chronic diastolic (congestive) heart failure: Secondary | ICD-10-CM | POA: Diagnosis not present

## 2019-02-07 DIAGNOSIS — I132 Hypertensive heart and chronic kidney disease with heart failure and with stage 5 chronic kidney disease, or end stage renal disease: Secondary | ICD-10-CM | POA: Diagnosis not present

## 2019-02-07 DIAGNOSIS — E1122 Type 2 diabetes mellitus with diabetic chronic kidney disease: Secondary | ICD-10-CM | POA: Diagnosis not present

## 2019-02-07 LAB — POCT INR: INR: 7.5 — AB (ref 2.0–3.0)

## 2019-02-07 NOTE — Patient Instructions (Signed)
Description   Pt has not taken her Coumadin dosage today, will hold Coumadin until recheck on 02/10/19.  Increase green leafy vegetable intake over the next few days.  Go to the ED with any signs of bleeding.  Recheck INR on Thursday. Call us at [phone] with any questions or concerns.

## 2019-02-07 NOTE — Telephone Encounter (Signed)
New Message   Pt said she was in the hospital and she knows she has missed her coumadin appts. She said she is on her way to [facility] for a draw today and she was wondering if she can have it checked while she is there or if she needs to schedule an appt Please call

## 2019-02-07 NOTE — Telephone Encounter (Signed)
I will route this call to Pharm-D so that they may advise pt appropriately for INR check.

## 2019-02-07 NOTE — Telephone Encounter (Signed)
Scheduled patient for INR check this AM since she will already be in the office for [facility] appt.

## 2019-02-08 DIAGNOSIS — Z7952 Long term (current) use of systemic steroids: Secondary | ICD-10-CM | POA: Diagnosis not present

## 2019-02-08 DIAGNOSIS — N184 Chronic kidney disease, stage 4 (severe): Secondary | ICD-10-CM | POA: Diagnosis not present

## 2019-02-08 DIAGNOSIS — I5033 Acute on chronic diastolic (congestive) heart failure: Secondary | ICD-10-CM | POA: Diagnosis not present

## 2019-02-08 DIAGNOSIS — I132 Hypertensive heart and chronic kidney disease with heart failure and with stage 5 chronic kidney disease, or end stage renal disease: Secondary | ICD-10-CM | POA: Diagnosis not present

## 2019-02-08 DIAGNOSIS — I5030 Unspecified diastolic (congestive) heart failure: Secondary | ICD-10-CM | POA: Diagnosis not present

## 2019-02-08 DIAGNOSIS — I4821 Permanent atrial fibrillation: Secondary | ICD-10-CM | POA: Diagnosis not present

## 2019-02-08 DIAGNOSIS — N185 Chronic kidney disease, stage 5: Secondary | ICD-10-CM | POA: Diagnosis not present

## 2019-02-08 DIAGNOSIS — E1122 Type 2 diabetes mellitus with diabetic chronic kidney disease: Secondary | ICD-10-CM | POA: Diagnosis not present

## 2019-02-08 DIAGNOSIS — I480 Paroxysmal atrial fibrillation: Secondary | ICD-10-CM | POA: Diagnosis not present

## 2019-02-08 DIAGNOSIS — I110 Hypertensive heart disease with heart failure: Secondary | ICD-10-CM | POA: Diagnosis not present

## 2019-02-09 ENCOUNTER — Ambulatory Visit (INDEPENDENT_AMBULATORY_CARE_PROVIDER_SITE_OTHER): Payer: Medicare Other | Admitting: Physician Assistant

## 2019-02-09 ENCOUNTER — Ambulatory Visit (HOSPITAL_COMMUNITY)
Admission: RE | Admit: 2019-02-09 | Discharge: 2019-02-09 | Disposition: A | Discharge status: Home / Self Care | Payer: Medicare Other | Source: Ambulatory Visit | Attending: Nephrology | Admitting: Nephrology

## 2019-02-09 VITALS — BP 156/76 | HR 81 | Temp 97.8°F | Resp 20

## 2019-02-09 VITALS — BP 122/84 | HR 93 | Temp 97.5°F | Resp 18 | Ht 62.5 in

## 2019-02-09 DIAGNOSIS — N184 Chronic kidney disease, stage 4 (severe): Secondary | ICD-10-CM | POA: Diagnosis not present

## 2019-02-09 LAB — POCT HEMOGLOBIN-HEMACUE: Hemoglobin: 10.4 g/dL — ABNORMAL LOW (ref 12.0–15.0)

## 2019-02-09 MED ORDER — CLONIDINE HCL 0.1 MG PO TABS: 0.1000 mg | ORAL_TABLET | Freq: Once | ORAL | Status: DC | PRN

## 2019-02-09 MED ORDER — DARBEPOETIN ALFA 60 MCG/0.3ML IJ SOSY
PREFILLED_SYRINGE | INTRAMUSCULAR | Status: AC
  Filled 2019-02-09: qty 0.3

## 2019-02-09 MED ORDER — DARBEPOETIN ALFA 60 MCG/0.3ML IJ SOSY
60.0000 ug | PREFILLED_SYRINGE | INTRAMUSCULAR | Status: DC
  Administered 2019-02-09: 60 ug via SUBCUTANEOUS

## 2019-02-09 NOTE — Progress Notes (Signed)
POST OPERATIVE OFFICE NOTE    CC:  F/u for surgery  HPI:  This is a 73 y.o. female who is s/p #1 Superficialization right brachiocephalic AV fistula  #2 ligation of multiple side branches by Dr. Oneida Alar.  She denies pain, loss of sensation and loss of motor in the right UE.  She is not currently on HD.  She is followed by Dr. Ambrose Pancoast.  Allergies  Allergen Reactions  . Enalapril Other (See Comments)    Systemic reaction - jerking  . Codeine Itching    Current Outpatient Medications  Medication Sig Dispense Refill  . atorvastatin (LIPITOR) 20 MG tablet Take 20 mg by mouth daily.    . brimonidine-timolol (COMBIGAN) 0.2-0.5 % ophthalmic solution Place 1 drop into both eyes 2 (two) times daily.    . Calcium Carbonate-Vitamin D3 (CALCIUM 600/VITAMIN D) 600-400 MG-UNIT TABS Take 1 tablet by mouth 2 (two) times daily.    . carvedilol (COREG) 25 MG tablet TAKE 1 TABLET BY MOUTH TWICE A DAY WITH A MEAL (Patient taking differently: Take 25 mg by mouth 2 (two) times daily with a meal. TAKE 1 TABLET BY MOUTH TWICE A DAY WITH A MEAL) 180 tablet 2  . Darbepoetin Alfa (ARANESP) 60 MCG/0.3ML SOSY injection Inject 60 mcg every 30 (thirty) days into the skin.    Marland Kitchen diltiazem (CARDIZEM CD) 120 MG 24 hr capsule Take 120 mg by mouth daily.    . diphenhydramine-acetaminophen (TYLENOL PM) 25-500 MG TABS tablet Take 1 tablet by mouth at bedtime.     . FEBUXOSTAT PO Take by mouth. Pt will check with her provider on when to start this medication    . furosemide (LASIX) 20 MG tablet Take 80 mg by mouth 2 (two) times daily.     . hydrALAZINE (APRESOLINE) 50 MG tablet Take 50 mg by mouth 3 (three) times daily.     Marland Kitchen latanoprost (XALATAN) 0.005 % ophthalmic solution Place 1 drop into both eyes at bedtime.    Marland Kitchen leflunomide (ARAVA) 10 MG tablet Take 10 mg by mouth daily.     . Magnesium Oxide 400 (240 Mg) MG TABS Take 400 mg by mouth 2 (two) times daily.  6  . pantoprazole (PROTONIX) 40 MG tablet Take 1 tablet (40  mg total) by mouth daily. 30 tablet 0  . potassium chloride SA (KLOR-CON M20) 20 MEQ tablet Take 1 tablet (20 mEq total) by mouth daily. 90 tablet 3  . predniSONE (DELTASONE) 5 MG tablet Take 5 mg by mouth daily.     Marland Kitchen PRESCRIPTION MEDICATION Inhale into the lungs at bedtime. CPAP    . Probiotic Product (ALIGN PO) Take 1 tablet by mouth at bedtime.     . vitamin B-12 (CYANOCOBALAMIN) 1000 MCG tablet Take 1,000 mcg by mouth daily.    Marland Kitchen warfarin (COUMADIN) 5 MG tablet TAKE 1 TABLET (5 MG TOTAL) BY MOUTH DAILY WITH SUPPER. 30 tablet 0   No current facility-administered medications for this visit.      ROS:  See HPI  Physical Exam:    Incision:  Well healed left UE incisions without open wounds or erythema Extremities:  Grip 5/5, sensation intact, with palpable radial pulse.  Palpable thrill through out the fistual   Assessment/Plan:  This is a 73 y.o. female who is s/p: Transposition left fistula  The fistula is ready for access if she needs it in the future.  F/U as needed with any concerns or problems.   Roxy Horseman PA-C [facility] [phone]  Clinic MD:  Oneida Alar

## 2019-02-09 NOTE — Progress Notes (Signed)
Pt was due for lab work today but has poor venous access and lab was unable to obtain samples.  Used a finger stick for her hemocue.

## 2019-02-10 ENCOUNTER — Encounter: Payer: Self-pay | Admitting: *Deleted

## 2019-02-10 ENCOUNTER — Other Ambulatory Visit: Payer: Self-pay | Admitting: *Deleted

## 2019-02-10 ENCOUNTER — Ambulatory Visit (INDEPENDENT_AMBULATORY_CARE_PROVIDER_SITE_OTHER): Payer: Medicare Other | Admitting: Pharmacist

## 2019-02-10 DIAGNOSIS — I132 Hypertensive heart and chronic kidney disease with heart failure and with stage 5 chronic kidney disease, or end stage renal disease: Secondary | ICD-10-CM | POA: Diagnosis not present

## 2019-02-10 DIAGNOSIS — Z7901 Long term (current) use of anticoagulants: Secondary | ICD-10-CM

## 2019-02-10 DIAGNOSIS — I480 Paroxysmal atrial fibrillation: Secondary | ICD-10-CM | POA: Diagnosis not present

## 2019-02-10 DIAGNOSIS — N185 Chronic kidney disease, stage 5: Secondary | ICD-10-CM | POA: Diagnosis not present

## 2019-02-10 DIAGNOSIS — I5033 Acute on chronic diastolic (congestive) heart failure: Secondary | ICD-10-CM | POA: Diagnosis not present

## 2019-02-10 DIAGNOSIS — I4821 Permanent atrial fibrillation: Secondary | ICD-10-CM | POA: Diagnosis not present

## 2019-02-10 DIAGNOSIS — Z7952 Long term (current) use of systemic steroids: Secondary | ICD-10-CM | POA: Diagnosis not present

## 2019-02-10 DIAGNOSIS — E1122 Type 2 diabetes mellitus with diabetic chronic kidney disease: Secondary | ICD-10-CM | POA: Diagnosis not present

## 2019-02-10 LAB — POCT INR: INR: 3.2 — AB (ref 2.0–3.0)

## 2019-02-10 NOTE — Patient Instructions (Signed)
Description   Do not take any Coumadin tonight, then start taking 1/2 tablet daily except 1 tablet on Sundays. Recheck INR in 1 week.

## 2019-02-10 NOTE — Patient Outreach (Signed)
[facility]) Care Management  02/10/2019  Melissa Bartlett [birth year] [id number]    RN spoke with pt today and received an update on her progress in managing her ongoing HF. Pt reports weighing regularly and weights yesterday were 231.5 lbs and today 230.4 lbs with minimal swelling noted to her LE (history). Pt report she continue to work with PT services ambulated today using a cane and RN continues to visit with HF education. RN also reiterated on the plan of care related to HF management and continue to encouraged pt on the purpose for daily weights on what to do if acute symptoms should occur. Pt is aware of the HF zones and verified she is in the GREEN zone today. RN continue to review the zones and discussed if he gaines 3 lbs over night or 5 lbs within one week to notify her provider. Pt has been instructed to take an extra fluid medication if needed with any swelling. Pt states she also applies her compression stockings if needed and aware to remove at night prior to bedtime. Discussed upcoming appointments and changes with her medications. RN strongly encouraged adherence with any changes. Denies any acute event since the last conversation with this RN case Freight forwarder. Will adjust interventions based upon pt's progress and re-evaluate pt's progress in a few weeks. No other requests or inquires at this time based upon case management services.   THN CM Care Plan Problem One     Most Recent Value  Care Plan Problem One  Knowledge deficit related to HF  Role Documenting the Problem One  Care Management Coordinator  Care Plan for Problem One  Active  THN Long Term Goal   Pt will not have any admission related to HF over the next 90 days based upon her increased knowledge base provided on the HF zones.   THN Long Term Goal Start Date  02/01/19  Interventions for Problem One Long Term Goal  Will continue to encouraged adherence with document her weights and monitoring hre HF. Will  verify weights and no 3 lb overnight or 5 lbs over one week have occurred as pt remains in the GREEN zone.  THN CM Short Term Goal #1   Pt will adhere to all medical appointments post-op discharge over the next 30 days.  THN CM Short Term Goal #1 Start Date  02/01/19  Interventions for Short Term Goal #1  Will review any medical appointments and extend to allow ongoing adherence.  THN CM Short Term Goal #2   Adherence with post-op medications over the next 30 days.  THN CM Short Term Goal #2 Start Date  02/01/19  Interventions for Short Term Goal #2  Will pt is taking allher prescribed medications with adjustments based upon her providers recommendations. WIll re-evaluate next month on this interventions.      Raina Mina, RN Care Management Coordinator [place] Office [phone]

## 2019-02-11 DIAGNOSIS — I132 Hypertensive heart and chronic kidney disease with heart failure and with stage 5 chronic kidney disease, or end stage renal disease: Secondary | ICD-10-CM | POA: Diagnosis not present

## 2019-02-11 DIAGNOSIS — E1122 Type 2 diabetes mellitus with diabetic chronic kidney disease: Secondary | ICD-10-CM | POA: Diagnosis not present

## 2019-02-11 DIAGNOSIS — N185 Chronic kidney disease, stage 5: Secondary | ICD-10-CM | POA: Diagnosis not present

## 2019-02-11 DIAGNOSIS — I4821 Permanent atrial fibrillation: Secondary | ICD-10-CM | POA: Diagnosis not present

## 2019-02-11 DIAGNOSIS — I5033 Acute on chronic diastolic (congestive) heart failure: Secondary | ICD-10-CM | POA: Diagnosis not present

## 2019-02-11 DIAGNOSIS — Z7952 Long term (current) use of systemic steroids: Secondary | ICD-10-CM | POA: Diagnosis not present

## 2019-02-15 DIAGNOSIS — I5033 Acute on chronic diastolic (congestive) heart failure: Secondary | ICD-10-CM | POA: Diagnosis not present

## 2019-02-15 DIAGNOSIS — E1122 Type 2 diabetes mellitus with diabetic chronic kidney disease: Secondary | ICD-10-CM | POA: Diagnosis not present

## 2019-02-15 DIAGNOSIS — I4821 Permanent atrial fibrillation: Secondary | ICD-10-CM | POA: Diagnosis not present

## 2019-02-15 DIAGNOSIS — Z7952 Long term (current) use of systemic steroids: Secondary | ICD-10-CM | POA: Diagnosis not present

## 2019-02-15 DIAGNOSIS — N185 Chronic kidney disease, stage 5: Secondary | ICD-10-CM | POA: Diagnosis not present

## 2019-02-15 DIAGNOSIS — I132 Hypertensive heart and chronic kidney disease with heart failure and with stage 5 chronic kidney disease, or end stage renal disease: Secondary | ICD-10-CM | POA: Diagnosis not present

## 2019-02-16 ENCOUNTER — Ambulatory Visit (INDEPENDENT_AMBULATORY_CARE_PROVIDER_SITE_OTHER): Payer: Medicare Other | Admitting: Pharmacist

## 2019-02-16 DIAGNOSIS — N184 Chronic kidney disease, stage 4 (severe): Secondary | ICD-10-CM | POA: Diagnosis not present

## 2019-02-16 DIAGNOSIS — I480 Paroxysmal atrial fibrillation: Secondary | ICD-10-CM

## 2019-02-16 DIAGNOSIS — Z7901 Long term (current) use of anticoagulants: Secondary | ICD-10-CM

## 2019-02-16 LAB — POCT INR: INR: 3.2 — AB (ref 2.0–3.0)

## 2019-02-16 NOTE — Patient Instructions (Signed)
Description   Do not take any Coumadin tonight, then start taking 1/2 tablet daily. Recheck INR in 1 week.

## 2019-02-17 ENCOUNTER — Other Ambulatory Visit: Payer: Self-pay | Admitting: Cardiology

## 2019-02-17 DIAGNOSIS — N179 Acute kidney failure, unspecified: Secondary | ICD-10-CM | POA: Diagnosis not present

## 2019-02-17 DIAGNOSIS — E1122 Type 2 diabetes mellitus with diabetic chronic kidney disease: Secondary | ICD-10-CM | POA: Diagnosis not present

## 2019-02-17 DIAGNOSIS — I5033 Acute on chronic diastolic (congestive) heart failure: Secondary | ICD-10-CM | POA: Diagnosis not present

## 2019-02-17 DIAGNOSIS — I120 Hypertensive chronic kidney disease with stage 5 chronic kidney disease or end stage renal disease: Secondary | ICD-10-CM | POA: Diagnosis not present

## 2019-02-17 DIAGNOSIS — R801 Persistent proteinuria, unspecified: Secondary | ICD-10-CM | POA: Diagnosis not present

## 2019-02-17 DIAGNOSIS — R0902 Hypoxemia: Secondary | ICD-10-CM | POA: Diagnosis not present

## 2019-02-17 DIAGNOSIS — N2581 Secondary hyperparathyroidism of renal origin: Secondary | ICD-10-CM | POA: Diagnosis not present

## 2019-02-17 DIAGNOSIS — Z9989 Dependence on other enabling machines and devices: Secondary | ICD-10-CM | POA: Diagnosis not present

## 2019-02-17 DIAGNOSIS — I770 Arteriovenous fistula, acquired: Secondary | ICD-10-CM | POA: Diagnosis not present

## 2019-02-17 DIAGNOSIS — D631 Anemia in chronic kidney disease: Secondary | ICD-10-CM | POA: Diagnosis not present

## 2019-02-17 DIAGNOSIS — N185 Chronic kidney disease, stage 5: Secondary | ICD-10-CM | POA: Diagnosis not present

## 2019-02-18 DIAGNOSIS — Z7952 Long term (current) use of systemic steroids: Secondary | ICD-10-CM | POA: Diagnosis not present

## 2019-02-18 DIAGNOSIS — E1122 Type 2 diabetes mellitus with diabetic chronic kidney disease: Secondary | ICD-10-CM | POA: Diagnosis not present

## 2019-02-18 DIAGNOSIS — I5033 Acute on chronic diastolic (congestive) heart failure: Secondary | ICD-10-CM | POA: Diagnosis not present

## 2019-02-18 DIAGNOSIS — I4821 Permanent atrial fibrillation: Secondary | ICD-10-CM | POA: Diagnosis not present

## 2019-02-18 DIAGNOSIS — N185 Chronic kidney disease, stage 5: Secondary | ICD-10-CM | POA: Diagnosis not present

## 2019-02-18 DIAGNOSIS — I132 Hypertensive heart and chronic kidney disease with heart failure and with stage 5 chronic kidney disease, or end stage renal disease: Secondary | ICD-10-CM | POA: Diagnosis not present

## 2019-02-22 ENCOUNTER — Encounter (HOSPITAL_COMMUNITY): Payer: Medicare Other

## 2019-02-22 DIAGNOSIS — Z7952 Long term (current) use of systemic steroids: Secondary | ICD-10-CM | POA: Diagnosis not present

## 2019-02-22 DIAGNOSIS — E1122 Type 2 diabetes mellitus with diabetic chronic kidney disease: Secondary | ICD-10-CM | POA: Diagnosis not present

## 2019-02-22 DIAGNOSIS — I4821 Permanent atrial fibrillation: Secondary | ICD-10-CM | POA: Diagnosis not present

## 2019-02-22 DIAGNOSIS — I5033 Acute on chronic diastolic (congestive) heart failure: Secondary | ICD-10-CM | POA: Diagnosis not present

## 2019-02-22 DIAGNOSIS — N185 Chronic kidney disease, stage 5: Secondary | ICD-10-CM | POA: Diagnosis not present

## 2019-02-22 DIAGNOSIS — I132 Hypertensive heart and chronic kidney disease with heart failure and with stage 5 chronic kidney disease, or end stage renal disease: Secondary | ICD-10-CM | POA: Diagnosis not present

## 2019-02-23 ENCOUNTER — Ambulatory Visit (INDEPENDENT_AMBULATORY_CARE_PROVIDER_SITE_OTHER): Payer: Medicare Other | Admitting: Pharmacist

## 2019-02-23 DIAGNOSIS — Z7901 Long term (current) use of anticoagulants: Secondary | ICD-10-CM

## 2019-02-23 DIAGNOSIS — I480 Paroxysmal atrial fibrillation: Secondary | ICD-10-CM | POA: Diagnosis not present

## 2019-02-23 LAB — POCT INR: INR: 2.7 (ref 2.0–3.0)

## 2019-02-23 NOTE — Patient Instructions (Signed)
Description   Continue taking warfarin 1/2 tablet daily. Recheck INR in 10 days.

## 2019-02-24 DIAGNOSIS — Z7952 Long term (current) use of systemic steroids: Secondary | ICD-10-CM | POA: Diagnosis not present

## 2019-02-24 DIAGNOSIS — N185 Chronic kidney disease, stage 5: Secondary | ICD-10-CM | POA: Diagnosis not present

## 2019-02-24 DIAGNOSIS — I132 Hypertensive heart and chronic kidney disease with heart failure and with stage 5 chronic kidney disease, or end stage renal disease: Secondary | ICD-10-CM | POA: Diagnosis not present

## 2019-02-24 DIAGNOSIS — I4821 Permanent atrial fibrillation: Secondary | ICD-10-CM | POA: Diagnosis not present

## 2019-02-24 DIAGNOSIS — E1122 Type 2 diabetes mellitus with diabetic chronic kidney disease: Secondary | ICD-10-CM | POA: Diagnosis not present

## 2019-02-24 DIAGNOSIS — I5033 Acute on chronic diastolic (congestive) heart failure: Secondary | ICD-10-CM | POA: Diagnosis not present

## 2019-02-28 DIAGNOSIS — Z79899 Other long term (current) drug therapy: Secondary | ICD-10-CM | POA: Diagnosis not present

## 2019-02-28 DIAGNOSIS — M1009 Idiopathic gout, multiple sites: Secondary | ICD-10-CM | POA: Diagnosis not present

## 2019-02-28 DIAGNOSIS — M2550 Pain in unspecified joint: Secondary | ICD-10-CM | POA: Diagnosis not present

## 2019-02-28 DIAGNOSIS — M150 Primary generalized (osteo)arthritis: Secondary | ICD-10-CM | POA: Diagnosis not present

## 2019-02-28 DIAGNOSIS — Z6841 Body Mass Index (BMI) 40.0 and over, adult: Secondary | ICD-10-CM | POA: Diagnosis not present

## 2019-02-28 DIAGNOSIS — M0579 Rheumatoid arthritis with rheumatoid factor of multiple sites without organ or systems involvement: Secondary | ICD-10-CM | POA: Diagnosis not present

## 2019-03-01 DIAGNOSIS — I132 Hypertensive heart and chronic kidney disease with heart failure and with stage 5 chronic kidney disease, or end stage renal disease: Secondary | ICD-10-CM | POA: Diagnosis not present

## 2019-03-01 DIAGNOSIS — I5033 Acute on chronic diastolic (congestive) heart failure: Secondary | ICD-10-CM | POA: Diagnosis not present

## 2019-03-01 DIAGNOSIS — Z7952 Long term (current) use of systemic steroids: Secondary | ICD-10-CM | POA: Diagnosis not present

## 2019-03-01 DIAGNOSIS — I4821 Permanent atrial fibrillation: Secondary | ICD-10-CM | POA: Diagnosis not present

## 2019-03-01 DIAGNOSIS — N185 Chronic kidney disease, stage 5: Secondary | ICD-10-CM | POA: Diagnosis not present

## 2019-03-01 DIAGNOSIS — E1122 Type 2 diabetes mellitus with diabetic chronic kidney disease: Secondary | ICD-10-CM | POA: Diagnosis not present

## 2019-03-04 ENCOUNTER — Other Ambulatory Visit: Payer: Self-pay | Admitting: Cardiology

## 2019-03-09 ENCOUNTER — Other Ambulatory Visit: Payer: Self-pay

## 2019-03-09 ENCOUNTER — Ambulatory Visit (HOSPITAL_COMMUNITY)
Admission: RE | Admit: 2019-03-09 | Discharge: 2019-03-09 | Disposition: A | Discharge status: Home / Self Care | Payer: Medicare Other | Source: Ambulatory Visit | Attending: Nephrology | Admitting: Nephrology

## 2019-03-09 ENCOUNTER — Ambulatory Visit (INDEPENDENT_AMBULATORY_CARE_PROVIDER_SITE_OTHER): Payer: Medicare Other | Admitting: Pharmacist

## 2019-03-09 VITALS — BP 145/86 | HR 89 | Temp 98.3°F | Resp 20

## 2019-03-09 DIAGNOSIS — I4821 Permanent atrial fibrillation: Secondary | ICD-10-CM | POA: Diagnosis not present

## 2019-03-09 DIAGNOSIS — I480 Paroxysmal atrial fibrillation: Secondary | ICD-10-CM

## 2019-03-09 DIAGNOSIS — I132 Hypertensive heart and chronic kidney disease with heart failure and with stage 5 chronic kidney disease, or end stage renal disease: Secondary | ICD-10-CM | POA: Diagnosis not present

## 2019-03-09 DIAGNOSIS — Z7901 Long term (current) use of anticoagulants: Secondary | ICD-10-CM

## 2019-03-09 DIAGNOSIS — Z7952 Long term (current) use of systemic steroids: Secondary | ICD-10-CM | POA: Diagnosis not present

## 2019-03-09 DIAGNOSIS — N185 Chronic kidney disease, stage 5: Secondary | ICD-10-CM | POA: Diagnosis not present

## 2019-03-09 DIAGNOSIS — E1122 Type 2 diabetes mellitus with diabetic chronic kidney disease: Secondary | ICD-10-CM | POA: Diagnosis not present

## 2019-03-09 DIAGNOSIS — N184 Chronic kidney disease, stage 4 (severe): Secondary | ICD-10-CM | POA: Diagnosis not present

## 2019-03-09 DIAGNOSIS — I5033 Acute on chronic diastolic (congestive) heart failure: Secondary | ICD-10-CM | POA: Diagnosis not present

## 2019-03-09 LAB — CBC WITH DIFFERENTIAL/PLATELET
Abs Immature Granulocytes: 0.08 10*3/uL — ABNORMAL HIGH (ref 0.00–0.07)
BASOS PCT: 0 %
Basophils Absolute: 0 10*3/uL (ref 0.0–0.1)
Eosinophils Absolute: 0 10*3/uL (ref 0.0–0.5)
Eosinophils Relative: 0 %
HCT: 38.5 % (ref 36.0–46.0)
Hemoglobin: 10.9 g/dL — ABNORMAL LOW (ref 12.0–15.0)
Immature Granulocytes: 1 %
Lymphocytes Relative: 6 %
Lymphs Abs: 0.9 10*3/uL (ref 0.7–4.0)
MCH: 26.7 pg (ref 26.0–34.0)
MCHC: 28.3 g/dL — ABNORMAL LOW (ref 30.0–36.0)
MCV: 94.4 fL (ref 80.0–100.0)
Monocytes Absolute: 1.1 10*3/uL — ABNORMAL HIGH (ref 0.1–1.0)
Monocytes Relative: 8 %
NEUTROS ABS: 11.9 10*3/uL — AB (ref 1.7–7.7)
Neutrophils Relative %: 85 %
Platelets: 215 10*3/uL (ref 150–400)
RBC: 4.08 MIL/uL (ref 3.87–5.11)
RDW: 15.9 % — ABNORMAL HIGH (ref 11.5–15.5)
WBC: 13.9 10*3/uL — ABNORMAL HIGH (ref 4.0–10.5)
nRBC: 0 % (ref 0.0–0.2)

## 2019-03-09 LAB — IRON AND TIBC
Iron: 53 ug/dL (ref 28–170)
Saturation Ratios: 17 % (ref 10.4–31.8)
TIBC: 316 ug/dL (ref 250–450)
UIBC: 263 ug/dL

## 2019-03-09 LAB — POCT HEMOGLOBIN-HEMACUE: Hemoglobin: 11.1 g/dL — ABNORMAL LOW (ref 12.0–15.0)

## 2019-03-09 LAB — FERRITIN: Ferritin: 81 ng/mL (ref 11–307)

## 2019-03-09 LAB — POCT INR: INR: 5.1 — AB (ref 2.0–3.0)

## 2019-03-09 MED ORDER — DARBEPOETIN ALFA 60 MCG/0.3ML IJ SOSY
PREFILLED_SYRINGE | INTRAMUSCULAR | Status: AC
  Administered 2019-03-09: 60 ug via SUBCUTANEOUS
  Filled 2019-03-09: qty 0.3

## 2019-03-09 NOTE — Patient Instructions (Signed)
Description   Please skip today and tomorrow's dose. Then take 1/2 tablet on Friday and Saturday. Skip Sunday's dose. Then take 1/2 tablet Monday. We will recheck INR in 6 days. Please call with any questions at [phone].

## 2019-03-10 LAB — PTH, INTACT AND CALCIUM
Calcium, Total (PTH): 8.8 mg/dL (ref 8.7–10.3)
PTH: 80 pg/mL — ABNORMAL HIGH (ref 15–65)

## 2019-03-11 ENCOUNTER — Other Ambulatory Visit: Payer: Self-pay

## 2019-03-11 ENCOUNTER — Other Ambulatory Visit: Payer: Self-pay | Admitting: *Deleted

## 2019-03-11 DIAGNOSIS — E1122 Type 2 diabetes mellitus with diabetic chronic kidney disease: Secondary | ICD-10-CM | POA: Diagnosis not present

## 2019-03-11 DIAGNOSIS — I4821 Permanent atrial fibrillation: Secondary | ICD-10-CM | POA: Diagnosis not present

## 2019-03-11 DIAGNOSIS — I132 Hypertensive heart and chronic kidney disease with heart failure and with stage 5 chronic kidney disease, or end stage renal disease: Secondary | ICD-10-CM | POA: Diagnosis not present

## 2019-03-11 DIAGNOSIS — N185 Chronic kidney disease, stage 5: Secondary | ICD-10-CM | POA: Diagnosis not present

## 2019-03-11 DIAGNOSIS — I5033 Acute on chronic diastolic (congestive) heart failure: Secondary | ICD-10-CM | POA: Diagnosis not present

## 2019-03-11 DIAGNOSIS — Z7952 Long term (current) use of systemic steroids: Secondary | ICD-10-CM | POA: Diagnosis not present

## 2019-03-11 NOTE — Patient Outreach (Signed)
[facility]) Care Management  03/11/2019  Melissa Bartlett [birth year] [id number]  RN spoke with pt today and received an update on her ongoing management of care related to her HF. Pt continue to manage this very well with a weight gained overnight of 3 lbs and aware with her provider's permission to take an extra fluid pill with daily weights over 3 lbs and 5 lbs that maybe gained within one week. Pt is taking an extra pill today. These orders were provided by Dr. Marval Regal ([email or web address]). Pt felling well with no other symptoms present. States she has a history of swelling to her right foot however no more swelling then usual.   Reviewed the plan of care and updated accordingly as pt continue to progress in managing her care. Will follow up in a few weeks on pt's ongoing management of care.  THN CM Care Plan Problem One     Most Recent Value  Care Plan Problem One  Knowledge deficit related to HF  Role Documenting the Problem One  Care Management Coordinator  Care Plan for Problem One  Active  THN Long Term Goal   Pt will not have any admission related to HF over the next 90 days based upon her increased knowledge base provided on the HF zones.   THN Long Term Goal Start Date  02/01/19  Interventions for Problem One Long Term Goal  Will continue to encouraged pt to adhere to the added dosing on her medication as premitted with fluid retention in mananging her ongoign HF. Will encouraged adherence and follow up accordingly. Will extend this goal to allow ongoing adherence.   THN CM Short Term Goal #1   Pt will adhere to all medical appointments post-op discharge over the next 30 days.  THN CM Short Term Goal #1 Start Date  02/01/19  THN CM Short Term Goal #1 Met Date  03/11/19  THN CM Short Term Goal #2   Adherence with post-op medications over the next 30 days.  THN CM Short Term Goal #2 Start Date  02/01/19  [facility] CM Short Term Goal #2 Met Date  03/11/19      Raina Mina, RN Care Management Coordinator [facility] Office [phone]

## 2019-03-15 ENCOUNTER — Ambulatory Visit (INDEPENDENT_AMBULATORY_CARE_PROVIDER_SITE_OTHER): Payer: Medicare Other | Admitting: Pharmacist

## 2019-03-15 ENCOUNTER — Other Ambulatory Visit: Payer: Self-pay

## 2019-03-15 DIAGNOSIS — I480 Paroxysmal atrial fibrillation: Secondary | ICD-10-CM

## 2019-03-15 LAB — POCT INR: INR: 3.5 — AB (ref 2.0–3.0)

## 2019-03-15 NOTE — Patient Instructions (Signed)
Description   Please skip today's dose. Then take 1/2 tablet Wednesday and Thursday. Skip Friday's dose, then continue taking 1/2 tablet daily. We will recheck INR in 3 weeks. Please call with any questions at [phone].

## 2019-03-21 DIAGNOSIS — E1122 Type 2 diabetes mellitus with diabetic chronic kidney disease: Secondary | ICD-10-CM | POA: Diagnosis not present

## 2019-03-21 DIAGNOSIS — I4821 Permanent atrial fibrillation: Secondary | ICD-10-CM | POA: Diagnosis not present

## 2019-03-21 DIAGNOSIS — N185 Chronic kidney disease, stage 5: Secondary | ICD-10-CM | POA: Diagnosis not present

## 2019-03-21 DIAGNOSIS — I5033 Acute on chronic diastolic (congestive) heart failure: Secondary | ICD-10-CM | POA: Diagnosis not present

## 2019-03-21 DIAGNOSIS — I132 Hypertensive heart and chronic kidney disease with heart failure and with stage 5 chronic kidney disease, or end stage renal disease: Secondary | ICD-10-CM | POA: Diagnosis not present

## 2019-03-21 DIAGNOSIS — Z7952 Long term (current) use of systemic steroids: Secondary | ICD-10-CM | POA: Diagnosis not present

## 2019-03-23 ENCOUNTER — Inpatient Hospital Stay (HOSPITAL_COMMUNITY)
Admission: EM | Admit: 2019-03-23 | Discharge: 2019-03-30 | DRG: 853 | Disposition: E | Payer: Medicare Other | Attending: Pulmonary Disease | Admitting: Pulmonary Disease

## 2019-03-23 ENCOUNTER — Encounter (HOSPITAL_COMMUNITY): Payer: Self-pay | Admitting: Emergency Medicine

## 2019-03-23 ENCOUNTER — Emergency Department (HOSPITAL_COMMUNITY): Payer: Medicare Other

## 2019-03-23 ENCOUNTER — Other Ambulatory Visit: Payer: Self-pay

## 2019-03-23 DIAGNOSIS — I10 Essential (primary) hypertension: Secondary | ICD-10-CM | POA: Diagnosis present

## 2019-03-23 DIAGNOSIS — L03315 Cellulitis of perineum: Secondary | ICD-10-CM

## 2019-03-23 DIAGNOSIS — I445 Left posterior fascicular block: Secondary | ICD-10-CM | POA: Diagnosis present

## 2019-03-23 DIAGNOSIS — E876 Hypokalemia: Secondary | ICD-10-CM | POA: Diagnosis not present

## 2019-03-23 DIAGNOSIS — E11649 Type 2 diabetes mellitus with hypoglycemia without coma: Secondary | ICD-10-CM | POA: Diagnosis not present

## 2019-03-23 DIAGNOSIS — Z79899 Other long term (current) drug therapy: Secondary | ICD-10-CM

## 2019-03-23 DIAGNOSIS — I5032 Chronic diastolic (congestive) heart failure: Secondary | ICD-10-CM | POA: Diagnosis not present

## 2019-03-23 DIAGNOSIS — R0600 Dyspnea, unspecified: Secondary | ICD-10-CM

## 2019-03-23 DIAGNOSIS — R1110 Vomiting, unspecified: Secondary | ICD-10-CM | POA: Diagnosis not present

## 2019-03-23 DIAGNOSIS — Z452 Encounter for adjustment and management of vascular access device: Secondary | ICD-10-CM

## 2019-03-23 DIAGNOSIS — D696 Thrombocytopenia, unspecified: Secondary | ICD-10-CM | POA: Diagnosis not present

## 2019-03-23 DIAGNOSIS — E1165 Type 2 diabetes mellitus with hyperglycemia: Secondary | ICD-10-CM | POA: Diagnosis not present

## 2019-03-23 DIAGNOSIS — Z8249 Family history of ischemic heart disease and other diseases of the circulatory system: Secondary | ICD-10-CM

## 2019-03-23 DIAGNOSIS — Z6841 Body Mass Index (BMI) 40.0 and over, adult: Secondary | ICD-10-CM

## 2019-03-23 DIAGNOSIS — Z7952 Long term (current) use of systemic steroids: Secondary | ICD-10-CM

## 2019-03-23 DIAGNOSIS — I469 Cardiac arrest, cause unspecified: Secondary | ICD-10-CM

## 2019-03-23 DIAGNOSIS — D689 Coagulation defect, unspecified: Secondary | ICD-10-CM | POA: Diagnosis not present

## 2019-03-23 DIAGNOSIS — J9601 Acute respiratory failure with hypoxia: Secondary | ICD-10-CM | POA: Diagnosis present

## 2019-03-23 DIAGNOSIS — E861 Hypovolemia: Secondary | ICD-10-CM | POA: Diagnosis not present

## 2019-03-23 DIAGNOSIS — E872 Acidosis: Secondary | ICD-10-CM | POA: Diagnosis not present

## 2019-03-23 DIAGNOSIS — A630 Anogenital (venereal) warts: Secondary | ICD-10-CM | POA: Diagnosis present

## 2019-03-23 DIAGNOSIS — Z66 Do not resuscitate: Secondary | ICD-10-CM | POA: Diagnosis not present

## 2019-03-23 DIAGNOSIS — I132 Hypertensive heart and chronic kidney disease with heart failure and with stage 5 chronic kidney disease, or end stage renal disease: Secondary | ICD-10-CM | POA: Diagnosis present

## 2019-03-23 DIAGNOSIS — R570 Cardiogenic shock: Secondary | ICD-10-CM | POA: Diagnosis not present

## 2019-03-23 DIAGNOSIS — R000 Tachycardia, unspecified: Secondary | ICD-10-CM | POA: Diagnosis not present

## 2019-03-23 DIAGNOSIS — A419 Sepsis, unspecified organism: Principal | ICD-10-CM | POA: Diagnosis present

## 2019-03-23 DIAGNOSIS — N186 End stage renal disease: Secondary | ICD-10-CM | POA: Diagnosis not present

## 2019-03-23 DIAGNOSIS — G4733 Obstructive sleep apnea (adult) (pediatric): Secondary | ICD-10-CM | POA: Diagnosis present

## 2019-03-23 DIAGNOSIS — I2781 Cor pulmonale (chronic): Secondary | ICD-10-CM | POA: Diagnosis present

## 2019-03-23 DIAGNOSIS — R509 Fever, unspecified: Secondary | ICD-10-CM | POA: Diagnosis not present

## 2019-03-23 DIAGNOSIS — N170 Acute kidney failure with tubular necrosis: Secondary | ICD-10-CM | POA: Diagnosis not present

## 2019-03-23 DIAGNOSIS — Z800 Family history of malignant neoplasm of digestive organs: Secondary | ICD-10-CM

## 2019-03-23 DIAGNOSIS — K610 Anal abscess: Secondary | ICD-10-CM | POA: Diagnosis present

## 2019-03-23 DIAGNOSIS — R0902 Hypoxemia: Secondary | ICD-10-CM | POA: Diagnosis not present

## 2019-03-23 DIAGNOSIS — I480 Paroxysmal atrial fibrillation: Secondary | ICD-10-CM | POA: Diagnosis present

## 2019-03-23 DIAGNOSIS — M069 Rheumatoid arthritis, unspecified: Secondary | ICD-10-CM | POA: Diagnosis present

## 2019-03-23 DIAGNOSIS — J9621 Acute and chronic respiratory failure with hypoxia: Secondary | ICD-10-CM | POA: Diagnosis not present

## 2019-03-23 DIAGNOSIS — E875 Hyperkalemia: Secondary | ICD-10-CM | POA: Diagnosis present

## 2019-03-23 DIAGNOSIS — R0602 Shortness of breath: Secondary | ICD-10-CM

## 2019-03-23 DIAGNOSIS — Z992 Dependence on renal dialysis: Secondary | ICD-10-CM

## 2019-03-23 DIAGNOSIS — E440 Moderate protein-calorie malnutrition: Secondary | ICD-10-CM | POA: Diagnosis not present

## 2019-03-23 DIAGNOSIS — R092 Respiratory arrest: Secondary | ICD-10-CM | POA: Diagnosis not present

## 2019-03-23 DIAGNOSIS — E1122 Type 2 diabetes mellitus with diabetic chronic kidney disease: Secondary | ICD-10-CM | POA: Diagnosis present

## 2019-03-23 DIAGNOSIS — Z981 Arthrodesis status: Secondary | ICD-10-CM

## 2019-03-23 DIAGNOSIS — E785 Hyperlipidemia, unspecified: Secondary | ICD-10-CM | POA: Diagnosis present

## 2019-03-23 DIAGNOSIS — T40605A Adverse effect of unspecified narcotics, initial encounter: Secondary | ICD-10-CM | POA: Diagnosis not present

## 2019-03-23 DIAGNOSIS — N184 Chronic kidney disease, stage 4 (severe): Secondary | ICD-10-CM | POA: Diagnosis present

## 2019-03-23 DIAGNOSIS — Z888 Allergy status to other drugs, medicaments and biological substances status: Secondary | ICD-10-CM

## 2019-03-23 DIAGNOSIS — K612 Anorectal abscess: Secondary | ICD-10-CM | POA: Diagnosis present

## 2019-03-23 DIAGNOSIS — Z885 Allergy status to narcotic agent status: Secondary | ICD-10-CM

## 2019-03-23 DIAGNOSIS — I499 Cardiac arrhythmia, unspecified: Secondary | ICD-10-CM | POA: Diagnosis not present

## 2019-03-23 DIAGNOSIS — Z7901 Long term (current) use of anticoagulants: Secondary | ICD-10-CM

## 2019-03-23 DIAGNOSIS — E662 Morbid (severe) obesity with alveolar hypoventilation: Secondary | ICD-10-CM | POA: Diagnosis present

## 2019-03-23 DIAGNOSIS — R110 Nausea: Secondary | ICD-10-CM

## 2019-03-23 DIAGNOSIS — J811 Chronic pulmonary edema: Secondary | ICD-10-CM

## 2019-03-23 DIAGNOSIS — R740 Nonspecific elevation of levels of transaminase and lactic acid dehydrogenase [LDH]: Secondary | ICD-10-CM

## 2019-03-23 DIAGNOSIS — D631 Anemia in chronic kidney disease: Secondary | ICD-10-CM | POA: Diagnosis present

## 2019-03-23 DIAGNOSIS — I9581 Postprocedural hypotension: Secondary | ICD-10-CM | POA: Diagnosis not present

## 2019-03-23 DIAGNOSIS — I4891 Unspecified atrial fibrillation: Secondary | ICD-10-CM | POA: Diagnosis not present

## 2019-03-23 DIAGNOSIS — I4821 Permanent atrial fibrillation: Secondary | ICD-10-CM | POA: Diagnosis present

## 2019-03-23 DIAGNOSIS — Z789 Other specified health status: Secondary | ICD-10-CM

## 2019-03-23 DIAGNOSIS — R7401 Elevation of levels of liver transaminase levels: Secondary | ICD-10-CM

## 2019-03-23 DIAGNOSIS — L02215 Cutaneous abscess of perineum: Secondary | ICD-10-CM

## 2019-03-23 DIAGNOSIS — Z978 Presence of other specified devices: Secondary | ICD-10-CM

## 2019-03-23 DIAGNOSIS — R197 Diarrhea, unspecified: Secondary | ICD-10-CM | POA: Diagnosis not present

## 2019-03-23 DIAGNOSIS — L0231 Cutaneous abscess of buttock: Secondary | ICD-10-CM | POA: Diagnosis present

## 2019-03-23 DIAGNOSIS — Z87891 Personal history of nicotine dependence: Secondary | ICD-10-CM

## 2019-03-23 DIAGNOSIS — E1129 Type 2 diabetes mellitus with other diabetic kidney complication: Secondary | ICD-10-CM | POA: Diagnosis present

## 2019-03-23 DIAGNOSIS — K219 Gastro-esophageal reflux disease without esophagitis: Secondary | ICD-10-CM | POA: Diagnosis present

## 2019-03-23 LAB — COMPREHENSIVE METABOLIC PANEL
ALBUMIN: 2.7 g/dL — AB (ref 3.5–5.0)
ALT: 62 U/L — ABNORMAL HIGH (ref 0–44)
AST: 57 U/L — ABNORMAL HIGH (ref 15–41)
Alkaline Phosphatase: 71 U/L (ref 38–126)
Anion gap: 13 (ref 5–15)
BUN: 72 mg/dL — ABNORMAL HIGH (ref 8–23)
CO2: 26 mmol/L (ref 22–32)
Calcium: 8.4 mg/dL — ABNORMAL LOW (ref 8.9–10.3)
Chloride: 103 mmol/L (ref 98–111)
Creatinine, Ser: 3.65 mg/dL — ABNORMAL HIGH (ref 0.44–1.00)
GFR calc Af Amer: 14 mL/min — ABNORMAL LOW (ref >60)
GFR calc non Af Amer: 12 mL/min — ABNORMAL LOW (ref >60)
Glucose, Bld: 177 mg/dL — ABNORMAL HIGH (ref 70–99)
Potassium: 5.2 mmol/L — ABNORMAL HIGH (ref 3.5–5.1)
Sodium: 142 mmol/L (ref 135–145)
Total Bilirubin: 1.8 mg/dL — ABNORMAL HIGH (ref 0.3–1.2)
Total Protein: 5.2 g/dL — ABNORMAL LOW (ref 6.5–8.1)

## 2019-03-23 LAB — CBC WITH DIFFERENTIAL/PLATELET
Abs Immature Granulocytes: 0.27 10*3/uL — ABNORMAL HIGH (ref 0.00–0.07)
Basophils Absolute: 0 10*3/uL (ref 0.0–0.1)
Basophils Relative: 0 %
Eosinophils Absolute: 0 10*3/uL (ref 0.0–0.5)
Eosinophils Relative: 0 %
HCT: 42.3 % (ref 36.0–46.0)
Hemoglobin: 11.9 g/dL — ABNORMAL LOW (ref 12.0–15.0)
IMMATURE GRANULOCYTES: 2 %
Lymphocytes Relative: 8 %
Lymphs Abs: 1.4 10*3/uL (ref 0.7–4.0)
MCH: 26.6 pg (ref 26.0–34.0)
MCHC: 28.1 g/dL — ABNORMAL LOW (ref 30.0–36.0)
MCV: 94.4 fL (ref 80.0–100.0)
Monocytes Absolute: 1.4 10*3/uL — ABNORMAL HIGH (ref 0.1–1.0)
Monocytes Relative: 8 %
Neutro Abs: 13.2 10*3/uL — ABNORMAL HIGH (ref 1.7–7.7)
Neutrophils Relative %: 82 %
Platelets: 148 10*3/uL — ABNORMAL LOW (ref 150–400)
RBC: 4.48 MIL/uL (ref 3.87–5.11)
RDW: 16.2 % — ABNORMAL HIGH (ref 11.5–15.5)
WBC: 16.2 10*3/uL — ABNORMAL HIGH (ref 4.0–10.5)
nRBC: 0 % (ref 0.0–0.2)

## 2019-03-23 LAB — LACTIC ACID, PLASMA: LACTIC ACID, VENOUS: 1.5 mmol/L (ref 0.5–1.9)

## 2019-03-23 LAB — PROTIME-INR
INR: 2.2 — ABNORMAL HIGH (ref 0.8–1.2)
Prothrombin Time: 24 seconds — ABNORMAL HIGH (ref 11.4–15.2)

## 2019-03-23 LAB — TROPONIN I: Troponin I: 0.09 ng/mL (ref <0.03)

## 2019-03-23 MED ORDER — CARVEDILOL 12.5 MG PO TABS
25.0000 mg | ORAL_TABLET | Freq: Once | ORAL | Status: AC
  Administered 2019-03-23: 25 mg via ORAL
  Filled 2019-03-23: qty 2

## 2019-03-23 MED ORDER — ONDANSETRON HCL 4 MG/2ML IJ SOLN: 4.0000 mg | Freq: Once | INTRAMUSCULAR | Status: DC

## 2019-03-23 MED ORDER — SODIUM CHLORIDE 0.9 % IV BOLUS
1000.0000 mL | Freq: Once | INTRAVENOUS | Status: AC
  Administered 2019-03-23: 1000 mL via INTRAVENOUS

## 2019-03-23 MED ORDER — ONDANSETRON 4 MG PO TBDP
4.0000 mg | ORAL_TABLET | Freq: Once | ORAL | Status: AC
  Administered 2019-03-23: 4 mg via ORAL
  Filled 2019-03-23: qty 1

## 2019-03-23 MED ORDER — SODIUM CHLORIDE 0.9 % IV BOLUS
500.0000 mL | Freq: Once | INTRAVENOUS | Status: AC
  Administered 2019-03-24: 500 mL via INTRAVENOUS

## 2019-03-23 MED ORDER — MORPHINE SULFATE (PF) 4 MG/ML IV SOLN
4.0000 mg | Freq: Once | INTRAVENOUS | Status: AC
  Administered 2019-03-24: 4 mg via INTRAVENOUS
  Filled 2019-03-23: qty 1

## 2019-03-23 NOTE — ED Notes (Signed)
IV team still attempting to place site

## 2019-03-23 NOTE — ED Notes (Signed)
Pt transported to CT ?

## 2019-03-23 NOTE — ED Notes (Signed)
Pt 02 sat on arrival 89%. Placed on 2L [place]. Pt now 92%

## 2019-03-23 NOTE — ED Notes (Signed)
IV nurse at bedside.

## 2019-03-23 NOTE — ED Notes (Addendum)
IV team attempting to place IV

## 2019-03-23 NOTE — ED Notes (Signed)
Melissa Bartlett, pt husband can be reached at [phone] or [phone]

## 2019-03-23 NOTE — ED Triage Notes (Addendum)
Pt coming by EMS with complaints of nausea, vomiting, and diarrhea since yesterday morning. Pt stating her temp at home was 100.7. Alert and oriented. Pt in a-fib RVR. Hx of a-fib

## 2019-03-23 NOTE — ED Provider Notes (Signed)
[facility] EMERGENCY DEPARTMENT Provider Note   CSN: [id number] Arrival date & time: 03/17/2019  1946    History   Chief Complaint Chief Complaint  Patient presents with  . Nausea  . Emesis    HPI Melissa Bartlett is a 73 y.o. female.     Patient is a 73 year old female with a history of paroxysmal atrial fibrillation on Coumadin, diabetes, chronic kidney disease, CHF with most recent echo showing EF of greater than 65% presenting today with nausea, vomiting, diarrhea and generalized weakness that all started today.  She has had numerous episodes of emesis and stool.  She denies any blood in the stool or emesis.  She states she was feeling normal yesterday except for a small boil on her buttocks.  She is not having any systemic symptoms.  She is feeling generally weak and did report feeling short of breath to EMS but denies it here.  Patient has had 1 dose of her medications but has not been able to hold anything else down.  She denies any chest pain, abdominal pain or cough.  The history is provided by the patient.  Emesis  Severity:  Severe Duration:  1 day Timing:  Constant Number of daily episodes:  Numerous Quality:  Stomach contents Progression:  Unchanged Chronicity:  New Recent urination:  Decreased Relieved by:  None tried Worsened by:  Ice chips and liquids Ineffective treatments:  None tried Associated symptoms: diarrhea, fever and myalgias   Associated symptoms: no abdominal pain, no cough, no sore throat and no URI   Associated symptoms comment:  Pt denies SOB to me but did c/o of SOB to EMS Risk factors: diabetes and prior abdominal surgery   Risk factors: no alcohol use, no sick contacts, no suspect food intake and no travel to endemic areas     Past Medical History:  Diagnosis Date  . Anemia   . CHF (congestive heart failure) ([place])   . CKD (chronic kidney disease), stage IV ([place])   . Complication of anesthesia    " I aspirated during  my gallbladder surgery"  . Diabetes ([place])   . GERD (gastroesophageal reflux disease)   . Heart murmur   . HTN (hypertension)   . Morbid obesity with BMI of 45.0-49.9, adult ([place])   . OSA (obstructive sleep apnea)    with AHI 11/hr with nocturnal hypoxemia , uses cpap  . PAF (paroxysmal atrial fibrillation) ([place])    s/p DCCV in 8/17  . Pneumonia   . RA (rheumatoid arthritis) ([place])   . Wears glasses   . Wears partial dentures     Patient Active Problem List   Diagnosis Date Noted  . Long term (current) use of anticoagulants 02/07/2019  . Acute renal failure superimposed on stage 5 chronic kidney disease, not on chronic dialysis ([place]) 01/24/2019  . AKI (acute kidney injury) ([place]) 02/10/2018  . Acute respiratory failure with hypoxia ([place]) 01/27/2018  . Permanent atrial fibrillation 01/27/2018  . RA (rheumatoid arthritis) ([place]) 01/27/2018  . Acute diastolic CHF (congestive heart failure) ([place]) 01/27/2018  . Chest pain, rule out acute myocardial infarction 10/28/2017  . Syncope 03/14/2017  . Chest pain 03/14/2017  . OSA (obstructive sleep apnea)   . Paroxysmal atrial fibrillation ([place]) 07/31/2016  . CKD (chronic kidney disease) stage 4, GFR 15-29 ml/min (HCC) 07/31/2016  . Essential hypertension 07/09/2015  . Morbid obesity ([place]) 07/09/2015  . DM (diabetes mellitus), type 2 with renal complications ([place]) 58/85/0277    Past  Surgical History:  Procedure Laterality Date  . ABDOMINAL HYSTERECTOMY    . ANKLE FUSION Right   . AV FISTULA PLACEMENT Right 07/14/2018   Procedure: ARTERIOVENOUS (AV) FISTULA CREATION BRACHIOCEPHALIC;  Surgeon: Elam Dutch, MD;  Location: [facility];  Service: Vascular;  Laterality: Right;  . BREAST BIOPSY    . CARDIOVERSION N/A 08/15/2016   Procedure: CARDIOVERSION;  Surgeon: Jerline Pain, MD;  Location: [facility] ENDOSCOPY;  Service: Cardiovascular;  Laterality: N/A;  . CHOLECYSTECTOMY    . COLONOSCOPY W/ BIOPSIES AND POLYPECTOMY    . DILATION AND CURETTAGE OF  UTERUS    . FISTULA SUPERFICIALIZATION Right 01/03/2019   Procedure: SUPERFICIALIZATION OF RIGHT ARM FISTULA;  Surgeon: Elam Dutch, MD;  Location: [place];  Service: Vascular;  Laterality: Right;  . GALLBLADDER SURGERY    . HYSTEROTOMY     Patrtial  . HYSTEROTOMY     Complete  . KNEE ARTHROSCOPY    . LEFT HEART CATH AND CORONARY ANGIOGRAPHY N/A 11/16/2017   Procedure: LEFT HEART CATH AND CORONARY ANGIOGRAPHY;  Surgeon: Martinique, Peter M, MD;  Location: [facility] CV LAB;  Service: Cardiovascular;  Laterality: N/A;  . MULTIPLE TOOTH EXTRACTIONS    . TUBAL LIGATION       OB History   No obstetric history on file.      Home Medications    Prior to Admission medications   Medication Sig Start Date End Date Taking? Authorizing Provider  atorvastatin (LIPITOR) 20 MG tablet Take 20 mg by mouth daily.    [provider]  brimonidine-timolol (COMBIGAN) 0.2-0.5 % ophthalmic solution Place 1 drop into both eyes 2 (two) times daily.    [provider]  calcitRIOL (ROCALTROL) 0.25 MCG capsule Take 0.25 mcg by mouth. Take 1 capsule every Monday, Wednesday, and Friday    [provider]  Calcium Carbonate-Vitamin D3 (CALCIUM 600/VITAMIN D) 600-400 MG-UNIT TABS Take 1 tablet by mouth 2 (two) times daily.    [provider]  carvedilol (COREG) 25 MG tablet TAKE 1 TABLET BY MOUTH TWICE A DAY WITH A MEAL Patient taking differently: Take 25 mg by mouth 2 (two) times daily with a meal. TAKE 1 TABLET BY MOUTH TWICE A DAY WITH A MEAL 06/21/18   Jerline Pain, MD  Darbepoetin Alfa (ARANESP) 60 MCG/0.3ML SOSY injection Inject 60 mcg every 30 (thirty) days into the skin.    [provider]  diltiazem (CARDIZEM CD) 120 MG 24 hr capsule Take 120 mg by mouth daily.    [provider]  diphenhydramine-acetaminophen (TYLENOL PM) 25-500 MG TABS tablet Take 1 tablet by mouth at bedtime.     [provider]  FEBUXOSTAT PO Take by mouth. Pt will check  with her provider on when to start this medication    [provider]  furosemide (LASIX) 20 MG tablet Take 80 mg by mouth 2 (two) times daily.     [provider]  hydrALAZINE (APRESOLINE) 50 MG tablet Take 50 mg by mouth 3 (three) times daily.     [provider]  KLOR-CON M20 20 MEQ tablet TAKE 1 TABLET BY MOUTH EVERY DAY 02/17/19   Jerline Pain, MD  latanoprost (XALATAN) 0.005 % ophthalmic solution Place 1 drop into both eyes at bedtime. 01/19/19   [provider]  leflunomide (ARAVA) 10 MG tablet Take 10 mg by mouth daily.     [provider]  Magnesium Oxide 400 (240 Mg) MG TABS Take 400 mg by mouth  2 (two) times daily. 05/27/18   [provider]  pantoprazole (PROTONIX) 40 MG tablet Take 1 tablet (40 mg total) by mouth daily. 10/29/17   Geradine Girt, DO  predniSONE (DELTASONE) 5 MG tablet Take 5 mg by mouth daily.  08/22/17   [provider]  PRESCRIPTION MEDICATION Inhale into the lungs at bedtime. CPAP    [provider]  Probiotic Product (ALIGN PO) Take 1 tablet by mouth at bedtime.     [provider]  vitamin B-12 (CYANOCOBALAMIN) 1000 MCG tablet Take 1,000 mcg by mouth daily.    [provider]  warfarin (COUMADIN) 5 MG tablet Take as directed by [facility] 03/04/19   Jerline Pain, MD    Family History Family History  Problem Relation Age of Onset  . Hypertension Mother        Family HX Diabetes  . Stomach cancer Father     Social History Social History   Tobacco Use  . Smoking status: Former Smoker    Types: Cigarettes    Last attempt to quit: 04/08/2016    Years since quitting: 2.9  . Smokeless tobacco: Never Used  Substance Use Topics  . Alcohol use: No    Alcohol/week: 0.0 standard drinks  . Drug use: No     Allergies   Enalapril and Codeine   Review of Systems Review of Systems  Constitutional: Positive for fever.  HENT: Negative for sore throat.    Respiratory: Negative for cough.   Gastrointestinal: Positive for diarrhea and vomiting. Negative for abdominal pain.  Musculoskeletal: Positive for myalgias.  All other systems reviewed and are negative.    Physical Exam Updated Vital Signs BP 128/67   Pulse (!) 137   Temp 98.5 F (36.9 C) (Oral)   Resp (!) 22   Ht 5\' 2"  (1.575 m)   Wt 103.9 kg   LMP  (LMP Unknown)   SpO2 90%   BMI 41.88 kg/m   Physical Exam Vitals signs and nursing note reviewed.  Constitutional:      General: She is not in acute distress.    Appearance: She is well-developed. She is obese.  HENT:     Head: Normocephalic and atraumatic.     Nose: Nose normal.     Mouth/Throat:     Mouth: Mucous membranes are dry.  Eyes:     Pupils: Pupils are equal, round, and reactive to light.  Cardiovascular:     Rate and Rhythm: Tachycardia present. Rhythm irregularly irregular.     Heart sounds: Normal heart sounds. No murmur. No friction rub.  Pulmonary:     Effort: Tachypnea present.     Breath sounds: Normal breath sounds. No wheezing or rales.  Abdominal:     General: Bowel sounds are normal. There is no distension.     Palpations: Abdomen is soft.     Tenderness: There is no abdominal tenderness. There is no guarding or rebound.  Musculoskeletal: Normal range of motion.        General: No tenderness.     Right lower leg: No edema.     Left lower leg: No edema.     Comments: No edema  Skin:    General: Skin is warm and dry.     Findings: No rash.  Neurological:     General: No focal deficit present.     Mental Status: She is alert and oriented to person, place, and time. Mental status is at baseline.  Cranial Nerves: No cranial nerve deficit.  Psychiatric:        Mood and Affect: Mood normal.        Behavior: Behavior normal.        Thought Content: Thought content normal.      ED Treatments / Results  Labs (all labs ordered are listed, but only abnormal results are displayed) Labs  Reviewed  CBC WITH DIFFERENTIAL/PLATELET - Abnormal; Notable for the following components:      Result Value   WBC 16.2 (*)    Hemoglobin 11.9 (*)    MCHC 28.1 (*)    RDW 16.2 (*)    Platelets 148 (*)    Neutro Abs 13.2 (*)    Monocytes Absolute 1.4 (*)    Abs Immature Granulocytes 0.27 (*)    All other components within normal limits  COMPREHENSIVE METABOLIC PANEL - Abnormal; Notable for the following components:   Potassium 5.2 (*)    Glucose, Bld 177 (*)    BUN 72 (*)    Creatinine, Ser 3.65 (*)    Calcium 8.4 (*)    Total Protein 5.2 (*)    Albumin 2.7 (*)    AST 57 (*)    ALT 62 (*)    Total Bilirubin 1.8 (*)    GFR calc non Af Amer 12 (*)    GFR calc Af Amer 14 (*)    All other components within normal limits  PROTIME-INR - Abnormal; Notable for the following components:   Prothrombin Time 24.0 (*)    INR 2.2 (*)    All other components within normal limits  TROPONIN I - Abnormal; Notable for the following components:   Troponin I 0.09 (*)    All other components within normal limits    EKG EKG Interpretation  Date/Time:  Wednesday March 23 2019 19:47:22 EDT Ventricular Rate:  140 PR Interval:    QRS Duration: 90 QT Interval:  294 QTC Calculation: 449 R Axis:   173 Text Interpretation:  Atrial fibrillation Left posterior fascicular block Low voltage, extremity and precordial leads Consider anterior infarct Baseline wander in lead(s) II III aVF V1 No significant change since last tracing Confirmed by Blanchie Dessert [phone]) on 03/12/2019 8:08:11 PM   Radiology No results found.  Procedures Procedures (including critical care time)  Medications Ordered in ED Medications  sodium chloride 0.9 % bolus 1,000 mL (has no administration in time range)  ondansetron (ZOFRAN) injection 4 mg (has no administration in time range)     Initial Impression / Assessment and Plan / ED Course  I have reviewed the triage vital signs and the nursing notes.  Pertinent  labs & imaging results that were available during my care of the patient were reviewed by me and considered in my medical decision making (see chart for details).       Pleasant 73 year old female presenting today with fever, nausea, vomiting and diarrhea.  Patient is found to be in A. fib RVR today but has not been able to take her home medications This evening.  Patient denies any chest pain but had complained of some shortness of breath earlier.  Patient sat were 89% on room air but in the mid 90s on 2 L.  Patient has no abdominal pain on exam.  She denies any chest pain.  EKG does not show any other changes.  Concerned that patient may have a viral etiology but has also not taken her medications and has dehydration causing the A. fib RVR.  Patient given Zofran and IV fluids.  Labs are pending.  After fluid if patient remains tachycardic we will give a dose of Cardizem.  10:38 PM Patient's white count is 16,000 but hemoglobin is stable, INR is therapeutic at 2.2.  Patient receiving IV fluids and heart rate is improving now in the 1 teens.  We will give her oral dose of carvedilol which she will take in the evenings.  11:00 PM On repeat evaluation patient's heart rate remains elevated with A. fib RVR.  She was given her home dose of Cardizem.  Patient's troponin is mildly elevated at 0.09 which is most likely secondary to strain.  CMP with unchanged renal function of 3.65 and mild elevation of LFTs.  On repeat evaluation patient has no abdominal pain however states she thinks she has a boil on her buttocks.  Upon further inspection patient has a large area of induration and tenderness in her perineum on the left side not involving the labia.  Concern for abscess.  Given patient is diabetic, fever and leukocytosis of 16,000 we will do a CT to further evaluate.  Patient given pain control.   Final Clinical Impressions(s) / ED Diagnoses   Final diagnoses:  None    ED Discharge Orders    None        Blanchie Dessert, MD 03/26/19 1025

## 2019-03-23 NOTE — ED Notes (Signed)
IV team at bedside and will try and collect labs.

## 2019-03-24 ENCOUNTER — Encounter (HOSPITAL_COMMUNITY): Payer: Self-pay | Admitting: *Deleted

## 2019-03-24 ENCOUNTER — Encounter (HOSPITAL_COMMUNITY): Admission: EM | Disposition: E | Payer: Self-pay | Source: Home / Self Care | Attending: Internal Medicine

## 2019-03-24 ENCOUNTER — Inpatient Hospital Stay (HOSPITAL_COMMUNITY): Payer: Medicare Other | Admitting: Anesthesiology

## 2019-03-24 ENCOUNTER — Inpatient Hospital Stay (HOSPITAL_COMMUNITY): Payer: Medicare Other

## 2019-03-24 DIAGNOSIS — J9811 Atelectasis: Secondary | ICD-10-CM | POA: Diagnosis not present

## 2019-03-24 DIAGNOSIS — A419 Sepsis, unspecified organism: Secondary | ICD-10-CM | POA: Diagnosis not present

## 2019-03-24 DIAGNOSIS — J9621 Acute and chronic respiratory failure with hypoxia: Secondary | ICD-10-CM | POA: Diagnosis not present

## 2019-03-24 DIAGNOSIS — Z79899 Other long term (current) drug therapy: Secondary | ICD-10-CM | POA: Diagnosis not present

## 2019-03-24 DIAGNOSIS — I959 Hypotension, unspecified: Secondary | ICD-10-CM

## 2019-03-24 DIAGNOSIS — L03315 Cellulitis of perineum: Secondary | ICD-10-CM | POA: Diagnosis not present

## 2019-03-24 DIAGNOSIS — I132 Hypertensive heart and chronic kidney disease with heart failure and with stage 5 chronic kidney disease, or end stage renal disease: Secondary | ICD-10-CM | POA: Diagnosis not present

## 2019-03-24 DIAGNOSIS — M069 Rheumatoid arthritis, unspecified: Secondary | ICD-10-CM | POA: Diagnosis not present

## 2019-03-24 DIAGNOSIS — N186 End stage renal disease: Secondary | ICD-10-CM | POA: Diagnosis not present

## 2019-03-24 DIAGNOSIS — Z7952 Long term (current) use of systemic steroids: Secondary | ICD-10-CM | POA: Diagnosis not present

## 2019-03-24 DIAGNOSIS — Z6841 Body Mass Index (BMI) 40.0 and over, adult: Secondary | ICD-10-CM | POA: Diagnosis not present

## 2019-03-24 DIAGNOSIS — D689 Coagulation defect, unspecified: Secondary | ICD-10-CM | POA: Diagnosis not present

## 2019-03-24 DIAGNOSIS — G4733 Obstructive sleep apnea (adult) (pediatric): Secondary | ICD-10-CM | POA: Diagnosis not present

## 2019-03-24 DIAGNOSIS — E872 Acidosis: Secondary | ICD-10-CM | POA: Diagnosis not present

## 2019-03-24 DIAGNOSIS — Z800 Family history of malignant neoplasm of digestive organs: Secondary | ICD-10-CM | POA: Diagnosis not present

## 2019-03-24 DIAGNOSIS — I5032 Chronic diastolic (congestive) heart failure: Secondary | ICD-10-CM | POA: Diagnosis not present

## 2019-03-24 DIAGNOSIS — E662 Morbid (severe) obesity with alveolar hypoventilation: Secondary | ICD-10-CM | POA: Diagnosis not present

## 2019-03-24 DIAGNOSIS — E875 Hyperkalemia: Secondary | ICD-10-CM | POA: Diagnosis not present

## 2019-03-24 DIAGNOSIS — I4821 Permanent atrial fibrillation: Secondary | ICD-10-CM | POA: Diagnosis not present

## 2019-03-24 DIAGNOSIS — K610 Anal abscess: Secondary | ICD-10-CM | POA: Diagnosis present

## 2019-03-24 DIAGNOSIS — Z789 Other specified health status: Secondary | ICD-10-CM | POA: Diagnosis not present

## 2019-03-24 DIAGNOSIS — L0231 Cutaneous abscess of buttock: Secondary | ICD-10-CM | POA: Diagnosis not present

## 2019-03-24 DIAGNOSIS — R0602 Shortness of breath: Secondary | ICD-10-CM | POA: Diagnosis not present

## 2019-03-24 DIAGNOSIS — Z8249 Family history of ischemic heart disease and other diseases of the circulatory system: Secondary | ICD-10-CM | POA: Diagnosis not present

## 2019-03-24 DIAGNOSIS — I469 Cardiac arrest, cause unspecified: Secondary | ICD-10-CM | POA: Diagnosis not present

## 2019-03-24 DIAGNOSIS — N170 Acute kidney failure with tubular necrosis: Secondary | ICD-10-CM | POA: Diagnosis not present

## 2019-03-24 DIAGNOSIS — Z981 Arthrodesis status: Secondary | ICD-10-CM | POA: Diagnosis not present

## 2019-03-24 DIAGNOSIS — I371 Nonrheumatic pulmonary valve insufficiency: Secondary | ICD-10-CM | POA: Diagnosis not present

## 2019-03-24 DIAGNOSIS — K612 Anorectal abscess: Secondary | ICD-10-CM | POA: Diagnosis not present

## 2019-03-24 DIAGNOSIS — J9601 Acute respiratory failure with hypoxia: Secondary | ICD-10-CM | POA: Diagnosis not present

## 2019-03-24 DIAGNOSIS — Z66 Do not resuscitate: Secondary | ICD-10-CM | POA: Diagnosis not present

## 2019-03-24 DIAGNOSIS — L02215 Cutaneous abscess of perineum: Secondary | ICD-10-CM | POA: Diagnosis not present

## 2019-03-24 DIAGNOSIS — N179 Acute kidney failure, unspecified: Secondary | ICD-10-CM | POA: Diagnosis not present

## 2019-03-24 DIAGNOSIS — N764 Abscess of vulva: Secondary | ICD-10-CM | POA: Diagnosis not present

## 2019-03-24 DIAGNOSIS — E440 Moderate protein-calorie malnutrition: Secondary | ICD-10-CM | POA: Diagnosis not present

## 2019-03-24 DIAGNOSIS — I361 Nonrheumatic tricuspid (valve) insufficiency: Secondary | ICD-10-CM | POA: Diagnosis not present

## 2019-03-24 DIAGNOSIS — Z7901 Long term (current) use of anticoagulants: Secondary | ICD-10-CM | POA: Diagnosis not present

## 2019-03-24 DIAGNOSIS — K219 Gastro-esophageal reflux disease without esophagitis: Secondary | ICD-10-CM | POA: Diagnosis present

## 2019-03-24 DIAGNOSIS — E1122 Type 2 diabetes mellitus with diabetic chronic kidney disease: Secondary | ICD-10-CM | POA: Diagnosis not present

## 2019-03-24 DIAGNOSIS — N184 Chronic kidney disease, stage 4 (severe): Secondary | ICD-10-CM | POA: Diagnosis not present

## 2019-03-24 DIAGNOSIS — I480 Paroxysmal atrial fibrillation: Secondary | ICD-10-CM

## 2019-03-24 DIAGNOSIS — I10 Essential (primary) hypertension: Secondary | ICD-10-CM | POA: Diagnosis not present

## 2019-03-24 DIAGNOSIS — A630 Anogenital (venereal) warts: Secondary | ICD-10-CM | POA: Diagnosis not present

## 2019-03-24 HISTORY — PX: CONDYLOMA EXCISION/FULGURATION: SHX1389

## 2019-03-24 HISTORY — PX: INCISION AND DRAINAGE PERIRECTAL ABSCESS: SHX1804

## 2019-03-24 HISTORY — PX: RECTAL EXAM UNDER ANESTHESIA: SHX6399

## 2019-03-24 LAB — GLUCOSE, CAPILLARY
Glucose-Capillary: 118 mg/dL — ABNORMAL HIGH (ref 70–99)
Glucose-Capillary: 98 mg/dL (ref 70–99)
Glucose-Capillary: 91 mg/dL (ref 70–99)
Glucose-Capillary: 93 mg/dL (ref 70–99)
Glucose-Capillary: 165 mg/dL — ABNORMAL HIGH (ref 70–99)
Glucose-Capillary: 180 mg/dL — ABNORMAL HIGH (ref 70–99)

## 2019-03-24 LAB — ABO/RH: ABO/RH(D): O POS

## 2019-03-24 LAB — COMPREHENSIVE METABOLIC PANEL
ALK PHOS: 70 U/L (ref 38–126)
ALT: 59 U/L — AB (ref 0–44)
AST: 60 U/L — ABNORMAL HIGH (ref 15–41)
Albumin: 2.6 g/dL — ABNORMAL LOW (ref 3.5–5.0)
Anion gap: 9 (ref 5–15)
BILIRUBIN TOTAL: 1.3 mg/dL — AB (ref 0.3–1.2)
BUN: 75 mg/dL — ABNORMAL HIGH (ref 8–23)
CO2: 26 mmol/L (ref 22–32)
CREATININE: 4.19 mg/dL — AB (ref 0.44–1.00)
Calcium: 7.9 mg/dL — ABNORMAL LOW (ref 8.9–10.3)
Chloride: 107 mmol/L (ref 98–111)
GFR calc Af Amer: 12 mL/min — ABNORMAL LOW (ref >60)
GFR calc non Af Amer: 10 mL/min — ABNORMAL LOW (ref >60)
GLUCOSE: 120 mg/dL — AB (ref 70–99)
Potassium: 4.5 mmol/L (ref 3.5–5.1)
Sodium: 142 mmol/L (ref 135–145)
Total Protein: 5.1 g/dL — ABNORMAL LOW (ref 6.5–8.1)

## 2019-03-24 LAB — SURGICAL PCR SCREEN
MRSA, PCR: NEGATIVE
Staphylococcus aureus: NEGATIVE

## 2019-03-24 LAB — CBC
HCT: 37.4 % (ref 36.0–46.0)
Hemoglobin: 11 g/dL — ABNORMAL LOW (ref 12.0–15.0)
MCH: 27.4 pg (ref 26.0–34.0)
MCHC: 29.4 g/dL — ABNORMAL LOW (ref 30.0–36.0)
MCV: 93 fL (ref 80.0–100.0)
Platelets: 132 10*3/uL — ABNORMAL LOW (ref 150–400)
RBC: 4.02 MIL/uL (ref 3.87–5.11)
RDW: 16.2 % — ABNORMAL HIGH (ref 11.5–15.5)
WBC: 14.1 10*3/uL — ABNORMAL HIGH (ref 4.0–10.5)
nRBC: 0 % (ref 0.0–0.2)

## 2019-03-24 LAB — TYPE AND SCREEN
ABO/RH(D): O POS
Antibody Screen: NEGATIVE

## 2019-03-24 LAB — PROTIME-INR
INR: 2.2 — ABNORMAL HIGH (ref 0.8–1.2)
Prothrombin Time: 24.4 seconds — ABNORMAL HIGH (ref 11.4–15.2)

## 2019-03-24 SURGERY — INCISION AND DRAINAGE, ABSCESS, PERIRECTAL
Anesthesia: General | Site: Perineum

## 2019-03-24 MED ORDER — DEXAMETHASONE SODIUM PHOSPHATE 10 MG/ML IJ SOLN
INTRAMUSCULAR | Status: AC
  Filled 2019-03-24: qty 1

## 2019-03-24 MED ORDER — SODIUM CHLORIDE 0.9 % IV SOLN
INTRAVENOUS | Status: DC | PRN
  Administered 2019-03-24: 09:00:00 via INTRAVENOUS

## 2019-03-24 MED ORDER — INSULIN ASPART 100 UNIT/ML ~~LOC~~ SOLN
0.0000 [IU] | Freq: Three times a day (TID) | SUBCUTANEOUS | Status: DC
  Administered 2019-03-25: 3 [IU] via SUBCUTANEOUS
  Administered 2019-03-25: 2 [IU] via SUBCUTANEOUS
  Administered 2019-03-26 – 2019-03-27 (×3): 1 [IU] via SUBCUTANEOUS
  Administered 2019-03-28 (×2): 2 [IU] via SUBCUTANEOUS
  Administered 2019-03-28: 1 [IU] via SUBCUTANEOUS

## 2019-03-24 MED ORDER — PROPOFOL 10 MG/ML IV BOLUS
INTRAVENOUS | Status: AC
  Filled 2019-03-24: qty 20

## 2019-03-24 MED ORDER — FENTANYL CITRATE (PF) 250 MCG/5ML IJ SOLN
INTRAMUSCULAR | Status: AC
  Filled 2019-03-24: qty 5

## 2019-03-24 MED ORDER — BRIMONIDINE TARTRATE 0.2 % OP SOLN
1.0000 [drp] | Freq: Two times a day (BID) | OPHTHALMIC | Status: DC
  Administered 2019-03-24 – 2019-03-29 (×9): 1 [drp] via OPHTHALMIC
  Filled 2019-03-24 (×2): qty 5

## 2019-03-24 MED ORDER — DEXAMETHASONE SODIUM PHOSPHATE 10 MG/ML IJ SOLN
INTRAMUSCULAR | Status: DC | PRN
  Administered 2019-03-24: 4 mg via INTRAVENOUS

## 2019-03-24 MED ORDER — LIDOCAINE 2% (20 MG/ML) 5 ML SYRINGE
INTRAMUSCULAR | Status: AC
  Filled 2019-03-24: qty 5

## 2019-03-24 MED ORDER — SUCCINYLCHOLINE CHLORIDE 200 MG/10ML IV SOSY
PREFILLED_SYRINGE | INTRAVENOUS | Status: DC | PRN
  Administered 2019-03-24: 100 mg via INTRAVENOUS

## 2019-03-24 MED ORDER — 0.9 % SODIUM CHLORIDE (POUR BTL) OPTIME
TOPICAL | Status: DC | PRN
  Administered 2019-03-24: 1000 mL

## 2019-03-24 MED ORDER — SODIUM CHLORIDE 0.9 % IV SOLN: INTRAVENOUS | Status: DC

## 2019-03-24 MED ORDER — TIMOLOL MALEATE 0.5 % OP SOLN
1.0000 [drp] | Freq: Two times a day (BID) | OPHTHALMIC | Status: DC
  Administered 2019-03-24 – 2019-03-29 (×9): 1 [drp] via OPHTHALMIC
  Filled 2019-03-24 (×2): qty 5

## 2019-03-24 MED ORDER — HYDROMORPHONE HCL 1 MG/ML IJ SOLN
0.5000 mg | INTRAMUSCULAR | Status: DC | PRN
  Administered 2019-03-24 – 2019-03-25 (×2): 0.5 mg via INTRAVENOUS
  Filled 2019-03-24: qty 1
  Filled 2019-03-24 (×2): qty 0.5

## 2019-03-24 MED ORDER — PHENYLEPHRINE 40 MCG/ML (10ML) SYRINGE FOR IV PUSH (FOR BLOOD PRESSURE SUPPORT)
PREFILLED_SYRINGE | INTRAVENOUS | Status: AC
  Filled 2019-03-24: qty 10

## 2019-03-24 MED ORDER — FUROSEMIDE 10 MG/ML IJ SOLN
40.0000 mg | Freq: Once | INTRAMUSCULAR | Status: AC
  Administered 2019-03-24: 40 mg via INTRAVENOUS
  Filled 2019-03-24: qty 4

## 2019-03-24 MED ORDER — SODIUM CHLORIDE 0.9 % IV SOLN: 250.0000 mL | INTRAVENOUS | Status: DC

## 2019-03-24 MED ORDER — PHENYLEPHRINE HCL-NACL 10-0.9 MG/250ML-% IV SOLN: 0.0000 ug/min | INTRAVENOUS | Status: DC

## 2019-03-24 MED ORDER — ONDANSETRON HCL 4 MG/2ML IJ SOLN
INTRAMUSCULAR | Status: DC | PRN
  Administered 2019-03-24: 4 mg via INTRAVENOUS

## 2019-03-24 MED ORDER — SODIUM CHLORIDE 0.9 % IV SOLN
INTRAVENOUS | Status: DC
  Administered 2019-03-24 – 2019-03-25 (×3): via INTRAVENOUS

## 2019-03-24 MED ORDER — SIMETHICONE 80 MG PO CHEW
80.0000 mg | CHEWABLE_TABLET | Freq: Four times a day (QID) | ORAL | Status: AC | PRN
  Administered 2019-03-24 – 2019-03-26 (×4): 80 mg via ORAL
  Filled 2019-03-24 (×4): qty 1

## 2019-03-24 MED ORDER — PANTOPRAZOLE SODIUM 40 MG PO TBEC
40.0000 mg | DELAYED_RELEASE_TABLET | Freq: Every day | ORAL | Status: DC
  Administered 2019-03-25 – 2019-03-29 (×5): 40 mg via ORAL
  Filled 2019-03-24 (×6): qty 1

## 2019-03-24 MED ORDER — ONDANSETRON HCL 4 MG/2ML IJ SOLN: 4.0000 mg | Freq: Once | INTRAMUSCULAR | Status: DC | PRN

## 2019-03-24 MED ORDER — CALCITRIOL 0.25 MCG PO CAPS
0.2500 ug | ORAL_CAPSULE | ORAL | Status: DC
  Administered 2019-03-25 – 2019-03-28 (×2): 0.25 ug via ORAL
  Filled 2019-03-24 (×4): qty 1

## 2019-03-24 MED ORDER — BUPIVACAINE-EPINEPHRINE 0.25% -1:200000 IJ SOLN
INTRAMUSCULAR | Status: DC | PRN
  Administered 2019-03-24: 30 mL

## 2019-03-24 MED ORDER — SODIUM CHLORIDE 0.9% IV SOLUTION: Freq: Once | INTRAVENOUS | Status: DC

## 2019-03-24 MED ORDER — SUGAMMADEX SODIUM 500 MG/5ML IV SOLN
INTRAVENOUS | Status: AC
  Filled 2019-03-24: qty 5

## 2019-03-24 MED ORDER — ROCURONIUM BROMIDE 50 MG/5ML IV SOSY
PREFILLED_SYRINGE | INTRAVENOUS | Status: DC | PRN
  Administered 2019-03-24: 40 mg via INTRAVENOUS

## 2019-03-24 MED ORDER — VASOPRESSIN 20 UNIT/ML IV SOLN
INTRAVENOUS | Status: AC
  Filled 2019-03-24: qty 1

## 2019-03-24 MED ORDER — HYDROCORTISONE NA SUCCINATE PF 100 MG IJ SOLR
50.0000 mg | Freq: Once | INTRAMUSCULAR | Status: AC
  Administered 2019-03-24: 50 mg via INTRAVENOUS
  Filled 2019-03-24: qty 1
  Filled 2019-03-24: qty 2

## 2019-03-24 MED ORDER — PHENYLEPHRINE HCL-NACL 10-0.9 MG/250ML-% IV SOLN
0.0000 ug/min | INTRAVENOUS | Status: DC
  Administered 2019-03-24: 80 ug/min via INTRAVENOUS
  Filled 2019-03-24: qty 250

## 2019-03-24 MED ORDER — SUGAMMADEX SODIUM 200 MG/2ML IV SOLN
INTRAVENOUS | Status: DC | PRN
  Administered 2019-03-24: 415.2 mg via INTRAVENOUS

## 2019-03-24 MED ORDER — SODIUM CHLORIDE 0.9 % IV SOLN
INTRAVENOUS | Status: DC | PRN
  Administered 2019-03-24: 25 ug/min via INTRAVENOUS

## 2019-03-24 MED ORDER — CARVEDILOL 25 MG PO TABS
25.0000 mg | ORAL_TABLET | Freq: Two times a day (BID) | ORAL | Status: DC
  Administered 2019-03-24 – 2019-03-25 (×3): 25 mg via ORAL
  Filled 2019-03-24 (×3): qty 1

## 2019-03-24 MED ORDER — FENTANYL CITRATE (PF) 100 MCG/2ML IJ SOLN: 25.0000 ug | INTRAMUSCULAR | Status: DC | PRN

## 2019-03-24 MED ORDER — ONDANSETRON HCL 4 MG/2ML IJ SOLN
INTRAMUSCULAR | Status: AC
  Filled 2019-03-24: qty 2

## 2019-03-24 MED ORDER — ALBUMIN HUMAN 5 % IV SOLN
INTRAVENOUS | Status: DC | PRN
  Administered 2019-03-24 (×2): via INTRAVENOUS

## 2019-03-24 MED ORDER — BRIMONIDINE TARTRATE-TIMOLOL 0.2-0.5 % OP SOLN: 1.0000 [drp] | Freq: Two times a day (BID) | OPHTHALMIC | Status: DC

## 2019-03-24 MED ORDER — LIDOCAINE 2% (20 MG/ML) 5 ML SYRINGE
INTRAMUSCULAR | Status: DC | PRN
  Administered 2019-03-24: 60 mg via INTRAVENOUS

## 2019-03-24 MED ORDER — FUROSEMIDE 80 MG PO TABS
80.0000 mg | ORAL_TABLET | Freq: Two times a day (BID) | ORAL | Status: DC
  Administered 2019-03-24 – 2019-03-25 (×2): 80 mg via ORAL
  Filled 2019-03-24 (×2): qty 1

## 2019-03-24 MED ORDER — SODIUM CHLORIDE 0.9 % IV SOLN
2.0000 g | Freq: Once | INTRAVENOUS | Status: DC
  Filled 2019-03-24: qty 20

## 2019-03-24 MED ORDER — VASOPRESSIN 20 UNIT/ML IV SOLN
INTRAVENOUS | Status: DC | PRN
  Administered 2019-03-24 (×3): 2 [IU] via INTRAVENOUS
  Administered 2019-03-24: 1 [IU] via INTRAVENOUS
  Administered 2019-03-24: 2 [IU] via INTRAVENOUS
  Administered 2019-03-24 (×2): 1 [IU] via INTRAVENOUS

## 2019-03-24 MED ORDER — SUCCINYLCHOLINE CHLORIDE 200 MG/10ML IV SOSY
PREFILLED_SYRINGE | INTRAVENOUS | Status: AC
  Filled 2019-03-24: qty 10

## 2019-03-24 MED ORDER — PIPERACILLIN-TAZOBACTAM IN DEX 2-0.25 GM/50ML IV SOLN
2.2500 g | Freq: Four times a day (QID) | INTRAVENOUS | Status: DC
  Administered 2019-03-24 – 2019-03-25 (×6): 2.25 g via INTRAVENOUS
  Filled 2019-03-24 (×7): qty 50

## 2019-03-24 MED ORDER — PROPOFOL 10 MG/ML IV BOLUS
INTRAVENOUS | Status: DC | PRN
  Administered 2019-03-24: 50 mg via INTRAVENOUS
  Administered 2019-03-24: 150 mg via INTRAVENOUS

## 2019-03-24 MED ORDER — FUROSEMIDE 10 MG/ML IJ SOLN
80.0000 mg | Freq: Once | INTRAMUSCULAR | Status: AC
  Administered 2019-03-24: 80 mg via INTRAVENOUS
  Filled 2019-03-24: qty 8

## 2019-03-24 MED ORDER — PREDNISONE 10 MG PO TABS
5.0000 mg | ORAL_TABLET | Freq: Every day | ORAL | Status: DC
  Administered 2019-03-25 – 2019-03-29 (×5): 5 mg via ORAL
  Filled 2019-03-24 (×6): qty 1

## 2019-03-24 MED ORDER — VANCOMYCIN HCL IN DEXTROSE 1-5 GM/200ML-% IV SOLN: 1000.0000 mg | Freq: Once | INTRAVENOUS | Status: DC

## 2019-03-24 MED ORDER — VANCOMYCIN HCL 10 G IV SOLR
1500.0000 mg | Freq: Once | INTRAVENOUS | Status: DC
  Filled 2019-03-24: qty 1500

## 2019-03-24 MED ORDER — VITAMIN B-12 1000 MCG PO TABS
1000.0000 ug | ORAL_TABLET | Freq: Every day | ORAL | Status: DC
  Administered 2019-03-25 – 2019-03-29 (×5): 1000 ug via ORAL
  Filled 2019-03-24 (×6): qty 1

## 2019-03-24 MED ORDER — HYDRALAZINE HCL 50 MG PO TABS
50.0000 mg | ORAL_TABLET | Freq: Three times a day (TID) | ORAL | Status: DC
  Administered 2019-03-24 (×2): 50 mg via ORAL
  Filled 2019-03-24 (×3): qty 1

## 2019-03-24 MED ORDER — BUPIVACAINE-EPINEPHRINE (PF) 0.25% -1:200000 IJ SOLN
INTRAMUSCULAR | Status: AC
  Filled 2019-03-24: qty 30

## 2019-03-24 MED ORDER — LATANOPROST 0.005 % OP SOLN
1.0000 [drp] | Freq: Every day | OPHTHALMIC | Status: DC
  Administered 2019-03-24 – 2019-03-28 (×5): 1 [drp] via OPHTHALMIC
  Filled 2019-03-24 (×2): qty 2.5

## 2019-03-24 MED ORDER — FENTANYL CITRATE (PF) 250 MCG/5ML IJ SOLN
INTRAMUSCULAR | Status: DC | PRN
  Administered 2019-03-24 (×2): 50 ug via INTRAVENOUS

## 2019-03-24 MED ORDER — DILTIAZEM HCL ER COATED BEADS 120 MG PO CP24
120.0000 mg | ORAL_CAPSULE | Freq: Every day | ORAL | Status: DC
  Filled 2019-03-24 (×2): qty 1

## 2019-03-24 MED ORDER — ALBUMIN HUMAN 5 % IV SOLN
INTRAVENOUS | Status: AC
  Filled 2019-03-24: qty 250

## 2019-03-24 MED ORDER — MORPHINE SULFATE (PF) 4 MG/ML IV SOLN: 4.0000 mg | INTRAVENOUS | Status: DC | PRN

## 2019-03-24 SURGICAL SUPPLY — 37 items
BRIEF STRETCH FOR OB PAD LRG (UNDERPADS AND DIAPERS) ×2 IMPLANT
CANISTER SUCT 3000ML PPV (MISCELLANEOUS) ×4 IMPLANT
CONT SPEC 4OZ CLIKSEAL STRL BL (MISCELLANEOUS) ×4 IMPLANT
COVER SURGICAL LIGHT HANDLE (MISCELLANEOUS) ×4 IMPLANT
COVER WAND RF STERILE (DRAPES) ×2 IMPLANT
DRSG PAD ABDOMINAL 8X10 ST (GAUZE/BANDAGES/DRESSINGS) ×2 IMPLANT
ELECT REM PT RETURN 9FT ADLT (ELECTROSURGICAL) ×4
ELECTRODE REM PT RTRN 9FT ADLT (ELECTROSURGICAL) IMPLANT
GAUZE PACKING IODOFORM 1/2 (PACKING) IMPLANT
GAUZE SPONGE 4X4 12PLY STRL (GAUZE/BANDAGES/DRESSINGS) IMPLANT
GAUZE SPONGE 4X4 12PLY STRL LF (GAUZE/BANDAGES/DRESSINGS) ×2 IMPLANT
GAUZE SPONGE 4X4 16PLY XRAY LF (GAUZE/BANDAGES/DRESSINGS) ×2 IMPLANT
GLOVE BIOGEL M STRL SZ7.5 (GLOVE) ×4 IMPLANT
GLOVE INDICATOR 8.0 STRL GRN (GLOVE) ×4 IMPLANT
GOWN STRL REUS W/ TWL LRG LVL3 (GOWN DISPOSABLE) ×2 IMPLANT
GOWN STRL REUS W/ TWL XL LVL3 (GOWN DISPOSABLE) ×2 IMPLANT
GOWN STRL REUS W/TWL LRG LVL3 (GOWN DISPOSABLE) ×8
GOWN STRL REUS W/TWL XL LVL3 (GOWN DISPOSABLE) ×4
KIT BASIN OR (CUSTOM PROCEDURE TRAY) ×4 IMPLANT
KIT TURNOVER KIT B (KITS) ×4 IMPLANT
NDL 18GX1X1/2 (RX/OR ONLY) (NEEDLE) IMPLANT
NEEDLE 18GX1X1/2 (RX/OR ONLY) (NEEDLE) IMPLANT
NS IRRIG 1000ML POUR BTL (IV SOLUTION) ×4 IMPLANT
PACK LITHOTOMY IV (CUSTOM PROCEDURE TRAY) ×4 IMPLANT
PAD ABD 8X10 STRL (GAUZE/BANDAGES/DRESSINGS) ×2 IMPLANT
PAD ARMBOARD 7.5X6 YLW CONV (MISCELLANEOUS) ×4 IMPLANT
PENCIL SMOKE EVACUATOR (MISCELLANEOUS) ×4 IMPLANT
SPONGE LAP 18X18 RF (DISPOSABLE) ×4 IMPLANT
SWAB COLLECTION DEVICE MRSA (MISCELLANEOUS) ×2 IMPLANT
SWAB CULTURE ESWAB REG 1ML (MISCELLANEOUS) ×2 IMPLANT
SYR CONTROL 10ML LL (SYRINGE) ×2 IMPLANT
TOWEL OR 17X24 6PK STRL BLUE (TOWEL DISPOSABLE) ×4 IMPLANT
TOWEL OR 17X26 10 PK STRL BLUE (TOWEL DISPOSABLE) ×4 IMPLANT
TUBE CONNECTING 12'X1/4 (SUCTIONS) ×1
TUBE CONNECTING 12X1/4 (SUCTIONS) ×3 IMPLANT
UNDERPAD 30X30 INCONTINENT (UNDERPADS AND DIAPERS) ×4 IMPLANT
YANKAUER SUCT BULB TIP NO VENT (SUCTIONS) ×4 IMPLANT

## 2019-03-24 NOTE — ED Notes (Signed)
Pt now mentioned that she feels like a boil has popped up on her left buttock area since yesterday and has been causing her pain

## 2019-03-24 NOTE — Progress Notes (Signed)
I have attempted to update her husband, Izell Coldiron, as she had requested at his cell [phone]. No visitors were allowed in preop 2/2 COVID restrictions. We will continue to try to reach out  Sharon Mt. Dema Severin, M.D. [facility]

## 2019-03-24 NOTE — Plan of Care (Signed)
I was informed by surgery PA that during the surgery, patient became hypotensive in shock, needed to be placed on pressors.  Patient has been transferred to ICU care, appreciate PCCM. [facility] hospitalist service will resume care after patient is out of ICU.   Estill Cotta M.D. [facility] 03/27/2019, 12:19 PM  Pager: [phone]

## 2019-03-24 NOTE — Op Note (Addendum)
02/28/2019  10:25 AM  PATIENT:  Melissa Bartlett  73 y.o. female  Patient Care Team: Donald Prose, MD as PCP - General (Family Medicine) Jerline Pain, MD as PCP - Cardiology (Cardiology) [place], [place] as Referring Physician (Nephrology) Donato Heinz, MD as Consulting Physician (Nephrology) Pleasant, Eppie Gibson, RN as [place] Management Tobi Bastos, RN as [place] Management  PRE-OPERATIVE DIAGNOSIS:  1. Perianal abscess 2. Perineal condyloma  POST-OPERATIVE DIAGNOSIS:   1. Perianal abscess 2. Right labial condyloma 3. Perianal condyloma  PROCEDURE:   1. Incision and drainage of left perianal abscess 2. Excision of condyloma right labia 3. Excision of condyloma perianal 4. Anorectal exam under anesthesia  SURGEON:  Surgeon(s): Ileana Roup, MD  ASSISTANT: OR staff   ANESTHESIA:   local and general  SPECIMEN:   1. Perianal abscess for Gram stain and culture 2. Right labial condyloma 3. Perianal condyloma  DISPOSITION OF SPECIMEN:  PATHOLOGY  COUNTS:  Sponge, needle, and instrument counts were reported correct x2 at conclusion.  EBL: 2 cc  Drains: None  PLAN OF CARE: Admit to inpatient   PATIENT DISPOSITION:  PACU - hemodynamically stable.  INDICATION: Very pleasant 73yo woman, on coumadin, with multiple medical problems whom presented to ED for evaluation. She was found to have a left gluteal/perianal abscess. She was also found to have condyloma/skin lesions on right labia and perianal. Optiosn were discussed moving forward and she opted to pursue surgical drainage of abscess with plans for removal of condyloma at same time. Please refer to notes elsewhere for details regarding this discussion  OR FINDINGS: Left gluteal abscess containing pus.  There was no apparent communication with the anal canal. This was drained, cultures were sent.  Wound was landscaped to facilitate drainage. condyloma on  right inferior labia and in the perianal area that were removed and sent separately as specimens.   DESCRIPTION: The patient was identified in the preoperative holding area and taken to the OR where she was placed on the operating room table. SCDs were placed.   anesthesia was induced without difficulty. The patient was then positioned in lithotomy position with Allen stirrups. Pressure points were then padded.  She was then prepped and draped in usual sterile fashion.  A surgical timeout was performed indicating the correct patient, procedure, positioning and need for preoperative antibiotics.  A rectal block was performed using Marcaine with epinephrine.    A well lubricated digital rectal exam was performed which demonstrated small internal hemorrhoids.  A Hill-Ferguson anoscope was into the anal canal and circumferential inspection demonstrated small internal hemorrhoids.  There were no perianal lesions/warts within the anal canal itself.  There were perianal condyloma externally as well as a condyloma on the right posterior labia.   Beginning with the perianal abscess, an incision was made over the most fluctuant area.  Purulent material freely drained.  Cultures were obtained and passed off.  All loculations were broken up.  There is no apparent communication with the anal canal or distal rectum.  A cruciate incision was made at this location and the corners were trimmed to facilitate drainage.  The wound is then irrigated with saline until clear.  There were no other loculations noted.  Hemostasis was then achieved with electrocautery.  The wound was then packed with a moist gauze.   Anesthetic was infiltrated underneath her perianal warts.  These were all excised sharply and passed off the specimen.  Hemostasis was achieved with electrocautery.  The right inferior labial wart was also excised sharply and passed off separately.  Hemostasis was achieved.  Wounds were covered with 4 x 4 gauze and an  ABD.  She was then taken out of lithotomy, awakened from anesthesia but remained hypotensive. Anesthesia therefore placed an arterial line and maintained blood pressures with vasopressin/phenylepherine. She was able to be extubated successfully. Given need for vasopressors and further workup for this, she was planned to go to ICU postoperatively. I will update her family as well.  DISPOSITION: ICU in guarded condition.

## 2019-03-24 NOTE — Anesthesia Postprocedure Evaluation (Signed)
Anesthesia Post Note  Patient: ASHNI LONZO  Procedure(s) Performed: IRRIGATION AND DEBRIDEMENT PERIANAL ABSCESS (N/A Perineum) Condyloma Removal (N/A Anus) Rectal Exam Under Anesthesia (N/A Anus)     Patient location during evaluation: PACU Anesthesia Type: General Level of consciousness: awake Pain management: pain level controlled Vital Signs Assessment: post-procedure vital signs reviewed and stable Respiratory status: spontaneous breathing, nonlabored ventilation, respiratory function stable and patient connected to nasal cannula oxygen Cardiovascular status: stable Postop Assessment: no apparent nausea or vomiting Anesthetic complications: no Comments: Patient with hemodynamic instability during emergence. Arterial line placed in OR. Remained normotensive in PACU with infusion of phenylephrine. Critical Care Medicine evaluated the patient in PACU.     Last Vitals:  Vitals:   03/16/2019 1155 03/08/2019 1228  BP: 121/79   Pulse: 94   Resp: 13   Temp:  (!) 36.4 C  SpO2: 98%     Last Pain:  Vitals:   02/27/2019 1228  TempSrc: Oral  PainSc:                  Karyl Kinnier Amillion Macchia

## 2019-03-24 NOTE — Progress Notes (Signed)
[facility]                                                                              Patient Demographics  Melissa Bartlett, is a 73 y.o. female, DOB - 09-Feb-1946, YIF:[id number]  Admit date - 03/12/2019   Admitting Physician Mariel Aloe, MD  Outpatient Primary MD for the patient is Donald Prose, MD  Outpatient specialists:   LOS - 0  days   Medical records reviewed and are as summarized below:    Chief Complaint  Patient presents with  . Nausea  . Emesis       Brief summary   Patient is a 73 year-old female with CHF, CKD, diabetes, essential hypertension, morbid obesity, obstructive sleep apnea, paroxysmal atrial fibrillation, rheumatoid arthritis presented with pain in the perineal region that started a day before the admission. She has associated nausea, and diarrhea.  She reports a fever with a T-max of 100.7 F.   CT pelvis showed abscess in the left perineum with possible communication to the anus In ED, patient was found to be in atrial fibrillation with RVR, initially pulse in 130s, normotensive.  Assessment & Plan    Principal Problem: Sepsis with perineal abscess -General surgery consulted, examined, no active drainage -Follow blood cultures, continue IV Zosyn, pain control -Plan for I&D in OR today, n.p.o. -Patient met sepsis criteria at the time of admission with leukocytosis, tachycardia, source likely due to perineal abscess  Active Problems:   Paroxysmal atrial fibrillation (HCC) with RVR -In ED, patient was found to have tachycardia with heart rate in 130s, improved with fluid resuscitation and addition of home Coreg which she had not taken the evening before admission -Continue diltiazem, Coreg -Hold Coumadin until cleared to restart by surgery  History of rheumatoid arthritis Continue prednisone, hold Arava in the setting of acute infection  Elevated troponin -Likely secondary to demand ischemia in the setting of sepsis, A.  fib with RVR -No chest pain or shortness of breath, no EKG changes    Essential hypertension -BP currently stable, continue Coreg, diltiazem, hydralazine    Morbid obesity (HCC) BMI of 41, counseled on diet and weight control    CKD (chronic kidney disease) stage 4, GFR 15-29 ml/min (HCC) -Patient follows [facility], baseline creatinine 3.5-4 -Fistula placed earlier this year, continue calcitriol  Code Status: Full CODE STATUS DVT Prophylaxis: Coumadin currently on hold for surgery today Family Communication: Discussed in detail with the patient, all imaging results, lab results explained to the patient    Disposition Plan: When cleared by general surgery  Time Spent in minutes   35 minutes  Procedures:  None  Consultants:   General surgery  Antimicrobials:   Anti-infectives (From admission, onward)   Start     Dose/Rate Route Frequency Ordered Stop   03/13/2019 0115  [MAR Hold]  piperacillin-tazobactam (ZOSYN) IVPB 2.25 g     (MAR Hold since Thu 03/03/2019 at 0856. Reason: Transfer to a Procedural area.)   2.25 g 100 mL/hr over 30 Minutes Intravenous Every 6 hours 03/09/2019 0103     03/09/2019 0030  vancomycin (VANCOCIN) 1,500 mg in sodium  chloride 0.9 % 500 mL IVPB  Status:  Discontinued     1,500 mg 250 mL/hr over 120 Minutes Intravenous  Once 03/25/2019 0016 03/22/2019 0054   03/27/2019 0015  vancomycin (VANCOCIN) IVPB 1000 mg/200 mL premix  Status:  Discontinued     1,000 mg 200 mL/hr over 60 Minutes Intravenous  Once 02/27/2019 0013 03/13/2019 0016   03/22/2019 0015  cefTRIAXone (ROCEPHIN) 2 g in sodium chloride 0.9 % 100 mL IVPB  Status:  Discontinued     2 g 200 mL/hr over 30 Minutes Intravenous  Once 03/20/2019 0013 03/14/2019 0054          Medications  Scheduled Meds: . [MAR Hold] sodium chloride   Intravenous Once  . [MAR Hold] brimonidine  1 drop Both Eyes BID   And  . [MAR Hold] timolol  1 drop Both Eyes BID  . [MAR Hold] calcitRIOL  0.25 mcg Oral Q  M,W,F  . [MAR Hold] carvedilol  25 mg Oral BID WC  . [MAR Hold] diltiazem  120 mg Oral Daily  . [MAR Hold] furosemide  80 mg Oral BID  . [MAR Hold] hydrALAZINE  50 mg Oral TID  . [MAR Hold] insulin aspart  0-9 Units Subcutaneous TID WC  . [MAR Hold] latanoprost  1 drop Both Eyes QHS  . [MAR Hold] ondansetron  4 mg Intravenous Once  . [MAR Hold] pantoprazole  40 mg Oral Daily  . [MAR Hold] predniSONE  5 mg Oral Daily  . [MAR Hold] vitamin B-12  1,000 mcg Oral Daily   Continuous Infusions: . [MAR Hold] piperacillin-tazobactam (ZOSYN)  IV 2.25 g (03/16/2019 0544)   PRN Meds:.0.9 % irrigation (POUR BTL), [MAR Hold]  HYDROmorphone (DILAUDID) injection      Subjective:   Dotti Busey was seen and examined today.  Pain in the left perineal region, more tender on examination.  No fevers.  Patient denies dizziness, chest pain, shortness of breath, abdominal pain, N/V.   Objective:   Vitals:   03/06/2019 0210 02/28/2019 0527 02/27/2019 0550 03/13/2019 0836  BP: 102/71 (!) 99/56 91/66   Pulse: 88 84 93   Resp: 16 17 20    Temp: 97.8 F (36.6 C) 98.1 F (36.7 C) 98.2 F (36.8 C)   TempSrc: Oral Oral Oral   SpO2: 94% 100% 100%   Weight:    103.8 kg  Height:    5' 2"  (1.575 m)    Intake/Output Summary (Last 24 hours) at 03/09/2019 1030 Last data filed at 03/04/2019 1008 Gross per 24 hour  Intake 243 ml  Output 25 ml  Net 218 ml     Wt Readings from Last 3 Encounters:  03/01/2019 103.8 kg  01/31/19 106.5 kg  01/03/19 112.5 kg     Exam  General: Alert and oriented x 3, NAD  Eyes:   HEENT:  Atraumatic, normocephalic  Cardiovascular: S1 S2 auscultated, Regular rate and rhythm.  Respiratory: Clear to auscultation bilaterally, no wheezing, rales or rhonchi  Gastrointestinal: Soft, nontender, nondistended, + bowel sounds  Ext: no pedal edema bilaterally  Neuro: No new neuro deficits  Musculoskeletal: No digital cyanosis, clubbing  Skin: Approximately 3 cm area of  induration and tenderness in the left perineal region  Psych: Normal affect and demeanor, alert and oriented x3    Data Reviewed:  I have personally reviewed following labs and imaging studies  Micro Results Recent Results (from the past 240 hour(s))  Surgical pcr screen     Status: None   Collection Time:  03/27/2019  2:28 AM  Result Value Ref Range Status   MRSA, PCR NEGATIVE NEGATIVE Final   Staphylococcus aureus NEGATIVE NEGATIVE Final    Comment: (NOTE) The Xpert SA Assay (FDA approved for NASAL specimens in patients 46 years of age and older), is one component of a comprehensive surveillance program. It is not intended to diagnose infection nor to guide or monitor treatment. Performed at [facility], [place] [address], [place], [place] [address]     Radiology Reports Ct Pelvis Wo Contrast  Result Date: 02/27/2019 CLINICAL DATA:  73 year old female with perineum abscess. EXAM: CT PELVIS WITHOUT CONTRAST TECHNIQUE: Multidetector CT imaging of the pelvis was performed following the standard protocol without intravenous contrast. COMPARISON:  None. FINDINGS: Urinary Tract: Kidneys not included. Diminutive and unremarkable urinary bladder. Distal ureters are decompressed. Incidental pelvic phleboliths. Bowel:  Negative visible large and small bowel. Vascular/Lymphatic: Aortoiliac calcified atherosclerosis. Vascular patency is not evaluated in the absence of IV contrast. Inguinal and pelvic lymph nodes appear symmetric and within normal limits. Reproductive:  Surgically absent. Other: Large body habitus. Left posterior perineum inflammatory stranding which may be contiguous with the anus on series 3, image 103. In the superficial medial left buttock there is a 29 x 19 x 35 millimeter round subcutaneous abscess. See series 3, image 110 and series 8, image 118. Regional inflammatory stranding. No soft tissue gas. Musculoskeletal: No acute osseous abnormality identified. Degenerative changes  in the lower lumbar spine. IMPRESSION: Superficial left perineum abscess with questionable communication to the anus measures 29 x 19 x 35 mm. See series 8, image 122. Regional cellulitis. No subcutaneous gas. Electronically Signed   By: Genevie Ann M.D.   On: 03/18/2019 23:49    Lab Data:  CBC: Recent Labs  Lab 03/11/2019 2120 03/18/2019 0543  WBC 16.2* 14.1*  NEUTROABS 13.2*  --   HGB 11.9* 11.0*  HCT 42.3 37.4  MCV 94.4 93.0  PLT 148* 732*   Basic Metabolic Panel: Recent Labs  Lab 03/12/2019 2157 03/27/2019 0543  NA 142 142  K 5.2* 4.5  CL 103 107  CO2 26 26  GLUCOSE 177* 120*  BUN 72* 75*  CREATININE 3.65* 4.19*  CALCIUM 8.4* 7.9*   GFR: Estimated Creatinine Clearance: 13.7 mL/min (A) (by C-G formula based on SCr of 4.19 mg/dL (H)). Liver Function Tests: Recent Labs  Lab 02/28/2019 2157 03/19/2019 0543  AST 57* 60*  ALT 62* 59*  ALKPHOS 71 70  BILITOT 1.8* 1.3*  PROT 5.2* 5.1*  ALBUMIN 2.7* 2.6*   No results for input(s): LIPASE, AMYLASE in the last 168 hours. No results for input(s): AMMONIA in the last 168 hours. Coagulation Profile: Recent Labs  Lab 03/13/2019 2157 03/04/2019 0543  INR 2.2* 2.2*   Cardiac Enzymes: Recent Labs  Lab 03/12/2019 2204  TROPONINI 0.09*   BNP (last 3 results) No results for input(s): PROBNP in the last 8760 hours. HbA1C: No results for input(s): HGBA1C in the last 72 hours. CBG: Recent Labs  Lab 03/26/2019 0241 03/19/2019 0806  GLUCAP 118* 98   Lipid Profile: No results for input(s): CHOL, HDL, LDLCALC, TRIG, CHOLHDL, LDLDIRECT in the last 72 hours. Thyroid Function Tests: No results for input(s): TSH, T4TOTAL, FREET4, T3FREE, THYROIDAB in the last 72 hours. Anemia Panel: No results for input(s): VITAMINB12, FOLATE, FERRITIN, TIBC, IRON, RETICCTPCT in the last 72 hours. Urine analysis:    Component Value Date/Time   COLORURINE STRAW (A) 01/24/2019 [place] 01/24/2019 1645  LABSPEC 1.008 01/24/2019 1645    PHURINE 5.0 01/24/2019 [place] 01/24/2019 1645   HGBUR NEGATIVE 01/24/2019 [place] 01/24/2019 [place] 01/24/2019 1645   PROTEINUR 30 (A) 01/24/2019 1645   UROBILINOGEN 0.2 04/28/2010 0304   NITRITE NEGATIVE 01/24/2019 [place] 01/24/2019 1645     Ripudeep Rai M.D. [facility] 03/22/2019, 10:30 AM  Pager: [phone] Between 7am to 7pm - call Pager - [phone]  After 7pm go to [email or web address] - password [email or web address]  Call night coverage person covering after 7pm

## 2019-03-24 NOTE — Progress Notes (Signed)
Subjective No acute events. Ready for OR this AM. Denies complaints aside from pressure at buttock  Objective: Vital signs in last 24 hours: Temp:  [97.8 F (36.6 C)-98.5 F (36.9 C)] 98.2 F (36.8 C) (03/26 0550) Pulse Rate:  [28-137] 93 (03/26 0550) Resp:  [9-38] 20 (03/26 0550) BP: (91-128)/(56-85) 91/66 (03/26 0550) SpO2:  [90 %-100 %] 100 % (03/26 0550) Weight:  [103.9 kg] 103.9 kg (03/25 1951) Last BM Date: 03/02/2019  Intake/Output from previous day: 03/25 0701 - 03/26 0700 In: 50 [IV Piggyback:50] Out: -  Intake/Output this shift: No intake/output data recorded.  Gen: NAD, comfortable CV: RRR Pulm: Normal work of breathing Abd: Soft, NT/ND Perineum: Left perineal abscess on buttock, more anterior. Also wart appearing growth 1x1cm on inferior right labia Ext: SCDs in place  Lab Results: CBC  Recent Labs    03/04/2019 2120 03/07/2019 0543  WBC 16.2* 14.1*  HGB 11.9* 11.0*  HCT 42.3 37.4  PLT 148* 132*   BMET Recent Labs    03/06/2019 2157 03/27/2019 0543  NA 142 142  K 5.2* 4.5  CL 103 107  CO2 26 26  GLUCOSE 177* 120*  BUN 72* 75*  CREATININE 3.65* 4.19*  CALCIUM 8.4* 7.9*   PT/INR Recent Labs    03/11/2019 2157 03/26/2019 0543  LABPROT 24.0* 24.4*  INR 2.2* 2.2*   ABG No results for input(s): PHART, HCO3 in the last 72 hours.  Invalid input(s): PCO2, PO2  Studies/Results:  Anti-infectives: Anti-infectives (From admission, onward)   Start     Dose/Rate Route Frequency Ordered Stop   03/13/2019 0115  piperacillin-tazobactam (ZOSYN) IVPB 2.25 g     2.25 g 100 mL/hr over 30 Minutes Intravenous Every 6 hours 03/25/2019 0103     03/14/2019 0030  vancomycin (VANCOCIN) 1,500 mg in sodium chloride 0.9 % 500 mL IVPB  Status:  Discontinued     1,500 mg 250 mL/hr over 120 Minutes Intravenous  Once 03/15/2019 0016 03/15/2019 0054   03/01/2019 0015  vancomycin (VANCOCIN) IVPB 1000 mg/200 mL premix  Status:  Discontinued     1,000 mg 200 mL/hr over 60 Minutes  Intravenous  Once 03/03/2019 0013 03/18/2019 0016   03/01/2019 0015  cefTRIAXone (ROCEPHIN) 2 g in sodium chloride 0.9 % 100 mL IVPB  Status:  Discontinued     2 g 200 mL/hr over 30 Minutes Intravenous  Once 03/16/2019 0013 03/21/2019 0054       Assessment/Plan: Patient Active Problem List   Diagnosis Date Noted  . Perineal abscess 03/22/2019  . Long term (current) use of anticoagulants 02/07/2019  . Acute renal failure superimposed on stage 5 chronic kidney disease, not on chronic dialysis ([place]) 01/24/2019  . AKI (acute kidney injury) ([place]) 02/10/2018  . Acute respiratory failure with hypoxia ([place]) 01/27/2018  . Permanent atrial fibrillation 01/27/2018  . RA (rheumatoid arthritis) ([place]) 01/27/2018  . Acute diastolic CHF (congestive heart failure) ([place]) 01/27/2018  . Chest pain, rule out acute myocardial infarction 10/28/2017  . Syncope 03/14/2017  . Chest pain 03/14/2017  . OSA (obstructive sleep apnea)   . Paroxysmal atrial fibrillation ([place]) 07/31/2016  . CKD (chronic kidney disease) stage 4, GFR 15-29 ml/min (HCC) 07/31/2016  . Essential hypertension 07/09/2015  . Morbid obesity ([place]) 07/09/2015  . DM (diabetes mellitus), type 2 with renal complications ([place]) 36/14/4315    -Getting FFP to reverse warfarin -Due to coronavirus her family cant visit - her husband Izell [place] [phone] is whom she would like [place] to call after procedure -The  anatomy and physiology of the anal canal was discussed at length with the patient. The pathophysiology of perianal abscess and possible fistula was discussed at length as well. -We discussed anorectal exam under anesthesia, incision and drainage of perineal abscess and removal of growth (likely wart) from bottom of labia for pathology as per her request. We discussed open wound and possible seton if fistula found. -The planned procedure, material risks (including, but not limited to, pain, bleeding, infection, scarring, need for blood transfusion, damage to  anal sphincter, incontinence of gas and/or stool, need for additional procedures, recurrence, pneumonia, heart attack, stroke, death) benefits and alternatives to surgery were discussed at length. I noted a good probability that the procedure would help improve their symptoms. The patient's questions were answered to her satisfaction, she voiced understanding and elected to proceed with surgery. Additionally, we discussed typical postoperative expectations and the recovery process.   LOS: 0 days   Sharon Mt. Dema Severin, M.D. [facility]

## 2019-03-24 NOTE — Progress Notes (Signed)
Pt arrived to floor from ED. Pt alert and oriented x4. Oriented to room and call bell.

## 2019-03-24 NOTE — ED Notes (Addendum)
Lab drawing blood cultures, then will precede with administering antibiotics

## 2019-03-24 NOTE — Progress Notes (Signed)
eLink Physician-Brief Progress Note Patient Name: Melissa Bartlett DOB: [birth year] MRN: [id number]   Date of Service  03/17/2019  HPI/Events of Note  Pt asking for Mylanta for indigestion. Poor urine output secondary to CKD stage 4,  eICU Interventions  Mylanta ordered. Monitor urine output and will consider repeat higher dose of Lasix if no UOP by AM.        Kerry Kass Ogan 03/03/2019, 11:15 PM

## 2019-03-24 NOTE — Anesthesia Procedure Notes (Signed)
Arterial Line Insertion Start/End03/18/2020 10:45 AM, 03/24/2019 10:55 AM Performed by: Murvin Natal, MD, anesthesiologist  Patient location: OR. Preanesthetic checklist: patient identified, IV checked, site marked, risks and benefits discussed, surgical consent, monitors and equipment checked, pre-op evaluation, timeout performed and anesthesia consent Patient sedated Left, radial was placed Catheter size: 20 Fr Hand hygiene performed , maximum sterile barriers used  and Seldinger technique used  Attempts: 1 Procedure performed using ultrasound guided technique. Ultrasound Notes:anatomy identified, needle tip was noted to be adjacent to the nerve/plexus identified and no ultrasound evidence of intravascular and/or intraneural injection Following insertion, dressing applied and Biopatch. Post procedure assessment: normal and unchanged  Patient tolerated the procedure well with no immediate complications.

## 2019-03-24 NOTE — Anesthesia Procedure Notes (Signed)
Procedure Name: Intubation Date/Time: 03/07/2019 9:40 AM Performed by: Renato Shin, CRNA Pre-anesthesia Checklist: Patient identified, Emergency Drugs available, Suction available and Patient being monitored Patient Re-evaluated:Patient Re-evaluated prior to induction Oxygen Delivery Method: Circle system utilized Preoxygenation: Pre-oxygenation with 100% oxygen Induction Type: IV induction and Rapid sequence Laryngoscope Size: Miller and 2 Grade View: Grade I Tube type: Oral Tube size: 7.0 mm Number of attempts: 1 Airway Equipment and Method: Stylet and Oral airway Placement Confirmation: ETT inserted through vocal cords under direct vision,  positive ETCO2 and breath sounds checked- equal and bilateral Secured at: 21 cm Tube secured with: Tape Dental Injury: Teeth and Oropharynx as per pre-operative assessment

## 2019-03-24 NOTE — ED Notes (Signed)
ED TO INPATIENT HANDOFF REPORT  ED Nurse Name and Phone #: [phone] Lucita Ferrara Name/Age/Gender Melissa Bartlett 73 y.o. female Room/Bed: 022C/022C  Code Status   Code Status: Prior  Home/SNF/Other Home Patient oriented to: self, place, time and situation Is this baseline? Yes   Triage Complete: Triage complete  Chief Complaint SOB, fever, no travel  Triage Note Pt coming by EMS with complaints of nausea, vomiting, and diarrhea since yesterday morning. Pt stating her temp at home was 100.7. Alert and oriented. Pt in a-fib RVR. Hx of a-fib   Allergies Allergies  Allergen Reactions  . Enalapril Other (See Comments)    Systemic reaction - jerking  . Codeine Itching    Level of Care/Admitting Diagnosis ED Disposition    ED Disposition Condition [facility] Area: [place] [[id number]]  Level of Care: Telemetry Medical [104]  Diagnosis: Perineal abscess [[id number]]  Admitting Physician: Mariel Aloe [[id number]]  Attending Physician: Mariel Aloe [phone]  Estimated length of stay: past midnight tomorrow  Certification:: I certify this patient will need inpatient services for at least 2 midnights  PT Class (Do Not Modify): Inpatient [101]  PT Acc Code (Do Not Modify): Private [1]       B Medical/Surgery History Past Medical History:  Diagnosis Date  . Anemia   . CHF (congestive heart failure) ([place])   . CKD (chronic kidney disease), stage IV ([place])   . Complication of anesthesia    " I aspirated during my gallbladder surgery"  . Diabetes ([place])   . GERD (gastroesophageal reflux disease)   . Heart murmur   . HTN (hypertension)   . Morbid obesity with BMI of 45.0-49.9, adult ([place])   . OSA (obstructive sleep apnea)    with AHI 11/hr with nocturnal hypoxemia , uses cpap  . PAF (paroxysmal atrial fibrillation) ([place])    s/p DCCV in 8/17  . Pneumonia   . RA (rheumatoid arthritis) ([place])   . Wears glasses   . Wears partial dentures    Past  Surgical History:  Procedure Laterality Date  . ABDOMINAL HYSTERECTOMY    . ANKLE FUSION Right   . AV FISTULA PLACEMENT Right 07/14/2018   Procedure: ARTERIOVENOUS (AV) FISTULA CREATION BRACHIOCEPHALIC;  Surgeon: Elam Dutch, MD;  Location: [place];  Service: Vascular;  Laterality: Right;  . BREAST BIOPSY    . CARDIOVERSION N/A 08/15/2016   Procedure: CARDIOVERSION;  Surgeon: Jerline Pain, MD;  Location: [facility] ENDOSCOPY;  Service: Cardiovascular;  Laterality: N/A;  . CHOLECYSTECTOMY    . COLONOSCOPY W/ BIOPSIES AND POLYPECTOMY    . DILATION AND CURETTAGE OF UTERUS    . FISTULA SUPERFICIALIZATION Right 01/03/2019   Procedure: SUPERFICIALIZATION OF RIGHT ARM FISTULA;  Surgeon: Elam Dutch, MD;  Location: [place];  Service: Vascular;  Laterality: Right;  . GALLBLADDER SURGERY    . HYSTEROTOMY     Patrtial  . HYSTEROTOMY     Complete  . KNEE ARTHROSCOPY    . LEFT HEART CATH AND CORONARY ANGIOGRAPHY N/A 11/16/2017   Procedure: LEFT HEART CATH AND CORONARY ANGIOGRAPHY;  Surgeon: Martinique, Peter M, MD;  Location: [place] CV LAB;  Service: Cardiovascular;  Laterality: N/A;  . MULTIPLE TOOTH EXTRACTIONS    . TUBAL LIGATION       A IV Location/Drains/Wounds Patient Lines/Drains/Airways Status   Active Line/Drains/Airways    Name:   Placement date:   Placement time:   Site:   Days:  Peripheral IV 03/20/2019 Left;Anterior Forearm   03/20/2019    2132    Forearm   1   Fistula / Graft Right Upper arm Arteriovenous fistula   07/14/18    0959    Upper arm   253   Midline Single Lumen 05/14/18 Midline Right Cephalic 8 cm 0 cm   63/87/56    4332    Cephalic   951   Incision (Closed) 07/14/18 Arm Right   07/14/18    0822     253   Incision (Closed) 01/03/19 Arm Right   01/03/19    1030     80          Intake/Output Last 24 hours No intake or output data in the 24 hours ending 03/16/2019 0124  Labs/Imaging Results for orders placed or performed during the hospital encounter of 03/18/2019 (from  the past 48 hour(s))  CBC with Differential/Platelet     Status: Abnormal   Collection Time: 03/11/2019  9:20 PM  Result Value Ref Range   WBC 16.2 (H) 4.0 - 10.5 K/uL   RBC 4.48 3.87 - 5.11 MIL/uL   Hemoglobin 11.9 (L) 12.0 - 15.0 g/dL   HCT 42.3 36.0 - 46.0 %   MCV 94.4 80.0 - 100.0 fL   MCH 26.6 26.0 - 34.0 pg   MCHC 28.1 (L) 30.0 - 36.0 g/dL   RDW 16.2 (H) 11.5 - 15.5 %   Platelets 148 (L) 150 - 400 K/uL   nRBC 0.0 0.0 - 0.2 %   Neutrophils Relative % 82 %   Neutro Abs 13.2 (H) 1.7 - 7.7 K/uL   Lymphocytes Relative 8 %   Lymphs Abs 1.4 0.7 - 4.0 K/uL   Monocytes Relative 8 %   Monocytes Absolute 1.4 (H) 0.1 - 1.0 K/uL   Eosinophils Relative 0 %   Eosinophils Absolute 0.0 0.0 - 0.5 K/uL   Basophils Relative 0 %   Basophils Absolute 0.0 0.0 - 0.1 K/uL   Immature Granulocytes 2 %   Abs Immature Granulocytes 0.27 (H) 0.00 - 0.07 K/uL    Comment: Performed at [facility], [address] [place], [place] [address]  Comprehensive metabolic panel     Status: Abnormal   Collection Time: 03/04/2019  9:57 PM  Result Value Ref Range   Sodium 142 135 - 145 mmol/L   Potassium 5.2 (H) 3.5 - 5.1 mmol/L   Chloride 103 98 - 111 mmol/L   CO2 26 22 - 32 mmol/L   Glucose, Bld 177 (H) 70 - 99 mg/dL   BUN 72 (H) 8 - 23 mg/dL   Creatinine, Ser 3.65 (H) 0.44 - 1.00 mg/dL   Calcium 8.4 (L) 8.9 - 10.3 mg/dL   Total Protein 5.2 (L) 6.5 - 8.1 g/dL   Albumin 2.7 (L) 3.5 - 5.0 g/dL   AST 57 (H) 15 - 41 U/L   ALT 62 (H) 0 - 44 U/L   Alkaline Phosphatase 71 38 - 126 U/L   Total Bilirubin 1.8 (H) 0.3 - 1.2 mg/dL   GFR calc non Af Amer 12 (L) >60 mL/min   GFR calc Af Amer 14 (L) >60 mL/min   Anion gap 13 5 - 15    Comment: Performed at [facility], [place] [address], [place], [place] [address]  Protime-INR     Status: Abnormal   Collection Time: 03/13/2019  9:57 PM  Result Value Ref Range   Prothrombin Time 24.0 (H) 11.4 - 15.2  seconds   INR 2.2 (H) 0.8 - 1.2    Comment: (NOTE) INR goal  varies based on device and disease states. Performed at [facility], [place] [address], [place], [place] [address]   Troponin I - ONCE - STAT     Status: Abnormal   Collection Time: 03/21/2019 10:04 PM  Result Value Ref Range   Troponin I 0.09 (HH) <0.03 ng/mL    Comment: CRITICAL RESULT CALLED TO, READ BACK BY AND VERIFIED WITH: Ferd Glassing [facility] 03/22/2019 2251 WAYK Performed at [facility], [place] [address], [place], [place] [address]   Lactic acid, plasma     Status: None   Collection Time: 03/22/2019 11:18 PM  Result Value Ref Range   Lactic Acid, Venous 1.5 0.5 - 1.9 mmol/L    Comment: Performed at [place] [address] [place], [place] [address]   Ct Pelvis Wo Contrast  Result Date: 03/07/2019 CLINICAL DATA:  73 year old female with perineum abscess. EXAM: CT PELVIS WITHOUT CONTRAST TECHNIQUE: Multidetector CT imaging of the pelvis was performed following the standard protocol without intravenous contrast. COMPARISON:  None. FINDINGS: Urinary Tract: Kidneys not included. Diminutive and unremarkable urinary bladder. Distal ureters are decompressed. Incidental pelvic phleboliths. Bowel:  Negative visible large and small bowel. Vascular/Lymphatic: Aortoiliac calcified atherosclerosis. Vascular patency is not evaluated in the absence of IV contrast. Inguinal and pelvic lymph nodes appear symmetric and within normal limits. Reproductive:  Surgically absent. Other: Large body habitus. Left posterior perineum inflammatory stranding which may be contiguous with the anus on series 3, image 103. In the superficial medial left buttock there is a 29 x 19 x 35 millimeter round subcutaneous abscess. See series 3, image 110 and series 8, image 118. Regional inflammatory stranding. No soft tissue gas. Musculoskeletal: No acute osseous abnormality identified. Degenerative changes in the lower lumbar spine. IMPRESSION: Superficial left perineum abscess with questionable communication to the  anus measures 29 x 19 x 35 mm. See series 8, image 122. Regional cellulitis. No subcutaneous gas. Electronically Signed   By: Genevie Ann M.D.   On: 03/04/2019 23:49    Pending Labs Unresulted Labs (From admission, onward)    Start     Ordered   03/18/2019 0500  Protime-INR  Daily,   R     03/07/2019 0120   03/07/2019 0020  Blood culture (routine x 2)  BLOOD CULTURE X 2,   STAT     03/17/2019 0019   Signed and Held  Comprehensive metabolic panel  Tomorrow morning,   R     Signed and Held   Signed and Held  CBC  Tomorrow morning,   R     Signed and Held   Signed and Held  Protime-INR  Tomorrow morning,   R     Signed and Held          Vitals/Pain Today's Vitals   03/20/2019 2243 03/09/2019 2312 03/28/2019 0015 03/02/2019 0045  BP:      Pulse:  (!) 113 (!) 105 (!) 105  Resp:  (!) 9 19 14   Temp:      TempSrc:      SpO2:  99% 97% 94%  Weight:      Height:      PainSc: 0-No pain       Isolation Precautions No active isolations  Medications Medications  ondansetron (ZOFRAN) injection 4 mg (4 mg Intravenous Not Given 03/08/2019 2148)  piperacillin-tazobactam (ZOSYN) IVPB 2.25 g (has no administration in  time range)  HYDROmorphone (DILAUDID) injection 0.5 mg (has no administration in time range)  sodium chloride 0.9 % bolus 1,000 mL (1,000 mLs Intravenous New Bag/Given 03/09/2019 2140)  ondansetron (ZOFRAN-ODT) disintegrating tablet 4 mg (4 mg Oral Given 03/15/2019 2046)  carvedilol (COREG) tablet 25 mg (25 mg Oral Given 03/19/2019 2241)  morphine 4 MG/ML injection 4 mg (4 mg Intravenous Given 03/15/2019 0024)  sodium chloride 0.9 % bolus 500 mL (500 mLs Intravenous New Bag/Given 03/12/2019 0050)    Mobility walks Low fall risk   Focused Assessments Cardiac Assessment Handoff:  Cardiac Rhythm: Atrial fibrillation Lab Results  Component Value Date   CKTOTAL 234 (H) 07/06/2011   CKMB 5.8 (H) 07/06/2011   TROPONINI 0.09 (HH) 03/21/2019   Lab Results  Component Value Date   DDIMER 1.63 (H) 01/27/2018    Does the Patient currently have chest pain? No     R Recommendations: See Admitting Provider Note  Report given to:   Additional Notes: N/A

## 2019-03-24 NOTE — Progress Notes (Addendum)
Pharmacy Antibiotic Note  Melissa Bartlett is a 72 y.o. female admitted on 03/27/2019 with perianal abscess.  Pharmacy has been consulted for Zosyn dosing.  Plan: Zosyn 2.25g IV Q6H.  Height: 5\' 2"  (157.5 cm) Weight: 229 lb (103.9 kg) IBW/kg (Calculated) : 50.1  Temp (24hrs), Avg:98.5 F (36.9 C), Min:98.5 F (36.9 C), Max:98.5 F (36.9 C)  Recent Labs  Lab 03/26/2019 2120 03/18/2019 2157 03/28/2019 2318  WBC 16.2*  --   --   CREATININE  --  3.65*  --   LATICACIDVEN  --   --  1.5    Estimated Creatinine Clearance: 15.7 mL/min (A) (by C-G formula based on SCr of 3.65 mg/dL (H)).    Allergies  Allergen Reactions  . Enalapril Other (See Comments)    Systemic reaction - jerking  . Codeine Itching     Thank you for allowing pharmacy to be a part of this patient's care.  Wynona Neat, PharmD, BCPS  03/18/2019 1:05 AM    Addendum: Pharmacy also consulted to start heparin for PAF when INR <2; currently 2.2.  Will monitor.  VB 1:21 AM

## 2019-03-24 NOTE — Transfer of Care (Signed)
Immediate Anesthesia Transfer of Care Note  Patient: Melissa Bartlett  Procedure(s) Performed: IRRIGATION AND DEBRIDEMENT PERIANAL ABSCESS (N/A Perineum) Condyloma Removal (N/A Anus) Rectal Exam Under Anesthesia (N/A Anus)  Patient Location: PACU  Anesthesia Type:General  Level of Consciousness: awake, alert  and patient cooperative  Airway & Oxygen Therapy: Patient Spontanous Breathing and Patient connected to face mask oxygen  Post-op Assessment: Report given to RN and Post -op Vital signs reviewed and stable  Post vital signs: Reviewed and stable  Last Vitals:  Vitals Value Taken Time  BP 111/56 03/25/2019 11:11 AM  Temp    Pulse 96 02/28/2019 11:12 AM  Resp 14 03/20/2019 11:13 AM  SpO2 99 % 03/21/2019 11:13 AM  Vitals shown include unvalidated device data.  Last Pain:  Vitals:   03/06/2019 0550  TempSrc: Oral  PainSc:          Complications: No apparent anesthesia complications

## 2019-03-24 NOTE — Progress Notes (Signed)
NAME:  Melissa Bartlett, MRN:  [id number], DOB:  1946-06-26, LOS: 0 ADMISSION DATE:  03/21/2019, CONSULTATION DATE: 03/05/2019 REFERRING MD: [facility], CHIEF COMPLAINT: Hypotension  HPI/course in hospital  73 year old woman who was referred to critical care for postoperative hypotension following excision and drainage of a peri-anal abscess. She presented with 2-day history of perianal pain.  She reported diarrhea and anorexia. She was hypotensive soon after induction and required phenylephrine throughout the case but only received 250 mL of colloid.  Past Medical History   Past Medical History:  Diagnosis Date  . Anemia   . CHF (congestive heart failure) ([place])   . CKD (chronic kidney disease), stage IV ([place])   . Complication of anesthesia    " I aspirated during my gallbladder surgery"  . Diabetes ([place])   . GERD (gastroesophageal reflux disease)   . Heart murmur   . HTN (hypertension)   . Morbid obesity with BMI of 45.0-49.9, adult ([place])   . OSA (obstructive sleep apnea)    with AHI 11/hr with nocturnal hypoxemia , uses cpap  . PAF (paroxysmal atrial fibrillation) ([place])    s/p DCCV in 8/17  . Pneumonia   . RA (rheumatoid arthritis) ([place])   . Wears glasses   . Wears partial dentures      Past Surgical History:  Procedure Laterality Date  . ABDOMINAL HYSTERECTOMY    . ANKLE FUSION Right   . AV FISTULA PLACEMENT Right 07/14/2018   Procedure: ARTERIOVENOUS (AV) FISTULA CREATION BRACHIOCEPHALIC;  Surgeon: Elam Dutch, MD;  Location: [place];  Service: Vascular;  Laterality: Right;  . BREAST BIOPSY    . CARDIOVERSION N/A 08/15/2016   Procedure: CARDIOVERSION;  Surgeon: Jerline Pain, MD;  Location: [facility] ENDOSCOPY;  Service: Cardiovascular;  Laterality: N/A;  . CHOLECYSTECTOMY    . COLONOSCOPY W/ BIOPSIES AND POLYPECTOMY    . DILATION AND CURETTAGE OF UTERUS    . FISTULA SUPERFICIALIZATION Right 01/03/2019   Procedure: SUPERFICIALIZATION OF RIGHT ARM  FISTULA;  Surgeon: Elam Dutch, MD;  Location: [place];  Service: Vascular;  Laterality: Right;  . GALLBLADDER SURGERY    . HYSTEROTOMY     Patrtial  . HYSTEROTOMY     Complete  . KNEE ARTHROSCOPY    . LEFT HEART CATH AND CORONARY ANGIOGRAPHY N/A 11/16/2017   Procedure: LEFT HEART CATH AND CORONARY ANGIOGRAPHY;  Surgeon: Martinique, Peter M, MD;  Location: [place] CV LAB;  Service: Cardiovascular;  Laterality: N/A;  . MULTIPLE TOOTH EXTRACTIONS    . TUBAL LIGATION       Review of Systems:   Review of Systems  All other systems reviewed and are negative.   Social History   reports that she quit smoking about 2 years ago. Her smoking use included cigarettes. She has never used smokeless tobacco. She reports that she does not drink alcohol or use drugs.   Family History   Her family history includes Hypertension in her mother; Stomach cancer in her father.   Allergies Allergies  Allergen Reactions  . Enalapril Other (See Comments)    Systemic reaction - jerking  . Codeine Itching     Home Medications  Prior to Admission medications   Medication Sig Start Date End Date Taking? Authorizing Provider  atorvastatin (LIPITOR) 20 MG tablet Take 20 mg by mouth daily.   Yes [provider]  brimonidine-timolol (COMBIGAN) 0.2-0.5 % ophthalmic solution Place 1 drop into both eyes 2 (two) times daily.   Yes [provider]  calcitRIOL (ROCALTROL) 0.25 MCG capsule Take 0.25 mcg by mouth. Take 1 capsule every Monday, Wednesday, and Friday   Yes [provider]  carvedilol (COREG) 25 MG tablet TAKE 1 TABLET BY MOUTH TWICE A DAY WITH A MEAL Patient taking differently: Take 25 mg by mouth 2 (two) times daily with a meal. TAKE 1 TABLET BY MOUTH TWICE A DAY WITH A MEAL 06/21/18  Yes Jerline Pain, MD  Darbepoetin Alfa (ARANESP) 60 MCG/0.3ML SOSY injection Inject 60 mcg every 30 (thirty) days into the skin.   Yes [provider]  diltiazem (CARDIZEM CD) 120  MG 24 hr capsule Take 120 mg by mouth daily.   Yes [provider]  diphenhydramine-acetaminophen (TYLENOL PM) 25-500 MG TABS tablet Take 1 tablet by mouth at bedtime.    Yes [provider]  febuxostat (ULORIC) 40 MG tablet Take 40 mg by mouth daily. Pt will check with her provider on when to start this medication   Yes [provider]  furosemide (LASIX) 20 MG tablet Take 80 mg by mouth 2 (two) times daily.    Yes [provider]  hydrALAZINE (APRESOLINE) 50 MG tablet Take 50 mg by mouth 3 (three) times daily.    Yes [provider]  KLOR-CON M20 20 MEQ tablet TAKE 1 TABLET BY MOUTH EVERY DAY Patient taking differently: Take 20 mEq by mouth daily.  02/17/19  Yes Jerline Pain, MD  latanoprost (XALATAN) 0.005 % ophthalmic solution Place 1 drop into both eyes at bedtime. 01/19/19  Yes [provider]  leflunomide (ARAVA) 10 MG tablet Take 10 mg by mouth daily.    Yes [provider]  Magnesium Oxide 400 (240 Mg) MG TABS Take 400 mg by mouth 2 (two) times daily. 05/27/18  Yes [provider]  pantoprazole (PROTONIX) 40 MG tablet Take 1 tablet (40 mg total) by mouth daily. 10/29/17  Yes Vann, Jessica U, DO  predniSONE (DELTASONE) 5 MG tablet Take 5 mg by mouth daily.  08/22/17  Yes [provider]  [place] into the lungs at bedtime. CPAP   Yes [provider]  Probiotic Product (ALIGN PO) Take 1 tablet by mouth at bedtime.    Yes [provider]  vitamin B-12 (CYANOCOBALAMIN) 1000 MCG tablet Take 1,000 mcg by mouth daily.   Yes [provider]  warfarin (COUMADIN) 5 MG tablet Take as directed by [facility] Patient taking differently: Take 2.5 mg by mouth daily. Take as directed by [facility] 03/04/19  Yes Jerline Pain, MD     Interim history/subjective:  Reports minimal pain.  States she is feeling thirsty.  Blood pressure has improved with albumin administration.   Currently weaning phenylephrine.  Objective   Blood pressure (!) 111/56, pulse 79, temperature 97.6 F (36.4 C), resp. rate 13, height 5\' 2"  (1.575 m), weight 103.8 kg, SpO2 98 %.        Intake/Output Summary (Last 24 hours) at 03/06/2019 1128 Last data filed at 03/28/2019 1113 Gross per 24 hour  Intake 1018 ml  Output 25 ml  Net 993 ml   Filed Weights   03/09/2019 1951 03/05/2019 0836  Weight: 103.9 kg 103.8 kg    Examination: Physical Exam Vitals signs reviewed.  Constitutional:      General: She is not in acute distress.    Appearance: She is obese. She is not toxic-appearing.  HENT:     Head: Normocephalic and atraumatic.  Mouth/Throat:     Mouth: Mucous membranes are dry.  Eyes:     Extraocular Movements: Extraocular movements intact.     Conjunctiva/sclera: Conjunctivae normal.  Neck:     Musculoskeletal: Neck supple.  Cardiovascular:     Rate and Rhythm: Normal rate. Rhythm irregular.     Heart sounds: Normal heart sounds.  Pulmonary:     Effort: Pulmonary effort is normal.     Breath sounds: Normal breath sounds.  Abdominal:     General: Abdomen is flat.     Palpations: Abdomen is soft.  Musculoskeletal:     Right lower leg: No edema.     Left lower leg: No edema.     Comments: Right upper arm fistula with thrill.  Skin:    General: Skin is warm and dry.     Capillary Refill: Capillary refill takes 2 to 3 seconds.  Neurological:     General: No focal deficit present.     Mental Status: She is alert and oriented to person, place, and time.      Ancillary tests (personally reviewed)  CBC: Recent Labs  Lab 03/22/2019 2120 03/27/2019 0543  WBC 16.2* 14.1*  NEUTROABS 13.2*  --   HGB 11.9* 11.0*  HCT 42.3 37.4  MCV 94.4 93.0  PLT 148* 132*    Basic Metabolic Panel: Recent Labs  Lab 03/11/2019 2157 03/23/2019 0543  NA 142 142  K 5.2* 4.5  CL 103 107  CO2 26 26  GLUCOSE 177* 120*  BUN 72* 75*  CREATININE 3.65* 4.19*  CALCIUM 8.4* 7.9*   GFR:  Estimated Creatinine Clearance: 13.7 mL/min (A) (by C-G formula based on SCr of 4.19 mg/dL (H)). Recent Labs  Lab 03/13/2019 2120 02/27/2019 2318 03/21/2019 0543  WBC 16.2*  --  14.1*  LATICACIDVEN  --  1.5  --     Liver Function Tests: Recent Labs  Lab 03/16/2019 2157 03/16/2019 0543  AST 57* 60*  ALT 62* 59*  ALKPHOS 71 70  BILITOT 1.8* 1.3*  PROT 5.2* 5.1*  ALBUMIN 2.7* 2.6*   No results for input(s): LIPASE, AMYLASE in the last 168 hours. No results for input(s): AMMONIA in the last 168 hours.  ABG No results found for: PHART, PCO2ART, PO2ART, HCO3, TCO2, ACIDBASEDEF, O2SAT   Coagulation Profile: Recent Labs  Lab 03/19/2019 2157 03/13/2019 0543  INR 2.2* 2.2*    Cardiac Enzymes: Recent Labs  Lab 03/08/2019 2204  TROPONINI 0.09*    HbA1C: Hgb A1c MFr Bld  Date/Time Value Ref Range Status  05/16/2018 05:47 PM 5.9 (H) 4.8 - 5.6 % Final    Comment:    (NOTE) Pre diabetes:          5.7%-6.4% Diabetes:              >6.4% Glycemic control for   <7.0% adults with diabetes   07/06/2011 02:16 AM 6.9 (H) <5.7 % Final    Comment:    (NOTE)                                                                       According to the ADA Clinical Practice Recommendations for 2011, when HbA1c is used as a screening test:  >=6.5%   Diagnostic of  Diabetes Mellitus           (if abnormal result is confirmed) 5.7-6.4%   Increased risk of developing Diabetes Mellitus References:Diagnosis and Classification of Diabetes Mellitus,Diabetes OJJK,0938,18(EXHBZ 1):S62-S69 and Standards of Medical Care in         Diabetes - 2011,Diabetes JIRC,7893,81 (Suppl 1):S11-S61.    CBG: Recent Labs  Lab 03/08/2019 0241 03/04/2019 0806 03/11/2019 1117  GLUCAP 118* 98 91    Assessment & Plan:  Critically ill due to postoperative hypotension due to combination of distributive shock following evacuation of abscess and hypovolemia. Appears volume contracted on examination. Chronic steroid use for  rheumatoid arthritis.  Component of relative adrenal insufficiency also possible. Rate controlled atrial fibrillation. Stage IV kidney disease and CHF by history.  Does not appear volume overloaded.  Plan:  Judicious slow volume resuscitation with crystalloid. Continue to wean phenylephrine to off, maintaining MAP>65 We will give additional dose of hydrocortisone Hold oral medications at this time until cleared by surgery Safe to hold anticoagulation for atrial fibrillation until 2 days postop and then resume warfarin.  Best practice:  Diet: N.p.o. for now Pain/Anxiety/Delirium protocol (if indicated): Fentanyl as needed VAP protocol (if indicated): Not applicable DVT prophylaxis: SCD resume warfarin postop once hemostasis achieved GI prophylaxis: Not indicated Urinary catheter: No catheter in place Glucose control: Phase 1 glycemic control Mobility: Progressive ambulation Code Status: Full code Family Communication: Family updated by Dr. Dema Severin Disposition: For ICU admission   Critical care time: 35 min including chart data review, examination of patient, multidisciplinary rounds, and frequent assessment and modification of fluid and vasopressor therapy.   Kipp Brood, MD [facility] ICU Physician [place]  Pager: [phone] Mobile: [phone] After hours: [phone].  03/16/2019, 11:28 AM

## 2019-03-24 NOTE — ED Provider Notes (Signed)
12:20 AM  I assumed care at signout to follow-up with CT imaging.  CT scan reveals large area of cellulitis with small abscess potentially communicating with the anus Discussed the findings with Dr. Windle Guard with general surgery.  She will evaluate the patient, but recommends medical admission.  IV antibiotics have been ordered.  Discussed the case with on-call hospitalist to admit the patient.  He request blood cultures. Patient was updated on plan, and overall she feels improved.  Her heart rate is now in the 100s. She reports her pain is improved   Ripley Fraise, MD 03/20/2019 0021

## 2019-03-24 NOTE — H&P (Signed)
History and Physical    Melissa Bartlett VZD:[id number] DOB: Mar 31, 1946 DOA: 03/17/2019  PCP: Donald Prose, MD  Patient coming from: Home  Chief Complaint: Pain of perineum  HPI: Melissa Bartlett is a 73 y.o. female with medical history significant of CHF, CKD, diabetes, essential hypertension, morbid obesity, obstructive sleep apnea, paroxysmal atrial fibrillation, rheumatoid arthritis.  Patient reports symptoms of pain in the perineum that started yesterday.  She has associated nausea, and diarrhea.  She reports a fever with a T-max of 100.7 F.  She has taken Preparation H in addition to trying to use an onion per her daughters suggestion which did not help her symptoms.  Symptoms have worsened since yesterday.  ED Course: Vitals: Afebrile, initial pulse in the 130s down to the low 100s/high 90s, normal respirations, normotensive, in mid to high 90% SPO2 on 2 L of oxygen Labs: Creatinine of 5.2, glucose of 177, BUN of 72, creatinine of 3.65, AST/ALT of 57 and 62 respectively, WBC of 16.2K, troponin of 0.09, INR 2.3 Imaging: CTA pelvis significant for an abscess located in left perineum with possible communication to the anus. Medications/Course: Zofran, morphine, normal saline 1.5 L bolus  Review of Systems: Review of Systems  Constitutional: Positive for fever. Negative for chills and malaise/fatigue.  Respiratory: Negative for cough and shortness of breath.   Cardiovascular: Negative for chest pain.  Gastrointestinal: Positive for diarrhea. Negative for abdominal pain, nausea and vomiting.  All other systems reviewed and are negative.   Past Medical History:  Diagnosis Date  . Anemia   . CHF (congestive heart failure) ([place])   . CKD (chronic kidney disease), stage IV ([place])   . Complication of anesthesia    " I aspirated during my gallbladder surgery"  . Diabetes ([place])   . GERD (gastroesophageal reflux disease)   . Heart murmur   . HTN (hypertension)   . Morbid obesity with  BMI of 45.0-49.9, adult ([place])   . OSA (obstructive sleep apnea)    with AHI 11/hr with nocturnal hypoxemia , uses cpap  . PAF (paroxysmal atrial fibrillation) ([place])    s/p DCCV in 8/17  . Pneumonia   . RA (rheumatoid arthritis) ([place])   . Wears glasses   . Wears partial dentures     Past Surgical History:  Procedure Laterality Date  . ABDOMINAL HYSTERECTOMY    . ANKLE FUSION Right   . AV FISTULA PLACEMENT Right 07/14/2018   Procedure: ARTERIOVENOUS (AV) FISTULA CREATION BRACHIOCEPHALIC;  Surgeon: Elam Dutch, MD;  Location: [facility];  Service: Vascular;  Laterality: Right;  . BREAST BIOPSY    . CARDIOVERSION N/A 08/15/2016   Procedure: CARDIOVERSION;  Surgeon: Jerline Pain, MD;  Location: [facility] ENDOSCOPY;  Service: Cardiovascular;  Laterality: N/A;  . CHOLECYSTECTOMY    . COLONOSCOPY W/ BIOPSIES AND POLYPECTOMY    . DILATION AND CURETTAGE OF UTERUS    . FISTULA SUPERFICIALIZATION Right 01/03/2019   Procedure: SUPERFICIALIZATION OF RIGHT ARM FISTULA;  Surgeon: Elam Dutch, MD;  Location: [facility];  Service: Vascular;  Laterality: Right;  . GALLBLADDER SURGERY    . HYSTEROTOMY     Patrtial  . HYSTEROTOMY     Complete  . KNEE ARTHROSCOPY    . LEFT HEART CATH AND CORONARY ANGIOGRAPHY N/A 11/16/2017   Procedure: LEFT HEART CATH AND CORONARY ANGIOGRAPHY;  Surgeon: Martinique, Peter M, MD;  Location: [facility];  Service: Cardiovascular;  Laterality: N/A;  . MULTIPLE TOOTH EXTRACTIONS    . TUBAL  LIGATION       reports that she quit smoking about 2 years ago. Her smoking use included cigarettes. She has never used smokeless tobacco. She reports that she does not drink alcohol or use drugs.  Allergies  Allergen Reactions  . Enalapril Other (See Comments)    Systemic reaction - jerking  . Codeine Itching    Family History  Problem Relation Age of Onset  . Hypertension Mother        Family HX Diabetes  . Stomach cancer Father    Prior to Admission medications   Medication  Sig Start Date End Date Taking? Authorizing Provider  atorvastatin (LIPITOR) 20 MG tablet Take 20 mg by mouth daily.   Yes [provider]  brimonidine-timolol (COMBIGAN) 0.2-0.5 % ophthalmic solution Place 1 drop into both eyes 2 (two) times daily.   Yes [provider]  calcitRIOL (ROCALTROL) 0.25 MCG capsule Take 0.25 mcg by mouth. Take 1 capsule every Monday, Wednesday, and Friday   Yes [provider]  carvedilol (COREG) 25 MG tablet TAKE 1 TABLET BY MOUTH TWICE A DAY WITH A MEAL Patient taking differently: Take 25 mg by mouth 2 (two) times daily with a meal. TAKE 1 TABLET BY MOUTH TWICE A DAY WITH A MEAL 06/21/18  Yes Jerline Pain, MD  Darbepoetin Alfa (ARANESP) 60 MCG/0.3ML SOSY injection Inject 60 mcg every 30 (thirty) days into the skin.   Yes [provider]  diltiazem (CARDIZEM CD) 120 MG 24 hr capsule Take 120 mg by mouth daily.   Yes [provider]  diphenhydramine-acetaminophen (TYLENOL PM) 25-500 MG TABS tablet Take 1 tablet by mouth at bedtime.    Yes [provider]  febuxostat (ULORIC) 40 MG tablet Take 40 mg by mouth daily. Pt will check with her provider on when to start this medication   Yes [provider]  furosemide (LASIX) 20 MG tablet Take 80 mg by mouth 2 (two) times daily.    Yes [provider]  hydrALAZINE (APRESOLINE) 50 MG tablet Take 50 mg by mouth 3 (three) times daily.    Yes [provider]  KLOR-CON M20 20 MEQ tablet TAKE 1 TABLET BY MOUTH EVERY DAY Patient taking differently: Take 20 mEq by mouth daily.  02/17/19  Yes Jerline Pain, MD  latanoprost (XALATAN) 0.005 % ophthalmic solution Place 1 drop into both eyes at bedtime. 01/19/19  Yes [provider]  leflunomide (ARAVA) 10 MG tablet Take 10 mg by mouth daily.    Yes [provider]  Magnesium Oxide 400 (240 Mg) MG TABS Take 400 mg by mouth 2 (two) times daily. 05/27/18  Yes [provider]   pantoprazole (PROTONIX) 40 MG tablet Take 1 tablet (40 mg total) by mouth daily. 10/29/17  Yes Vann, Jessica U, DO  predniSONE (DELTASONE) 5 MG tablet Take 5 mg by mouth daily.  08/22/17  Yes [provider]  [place] into the lungs at bedtime. CPAP   Yes [provider]  Probiotic Product (ALIGN PO) Take 1 tablet by mouth at bedtime.    Yes [provider]  vitamin B-12 (CYANOCOBALAMIN) 1000 MCG tablet Take 1,000 mcg by mouth daily.   Yes [provider]  warfarin (COUMADIN) 5 MG tablet Take as directed by [facility] Patient taking differently: Take 2.5 mg by mouth daily. Take as directed by [facility] 03/04/19  Yes Jerline Pain, MD    Physical Exam:  Physical Exam Constitutional:  General: She is not in acute distress.    Appearance: She is well-developed. She is not diaphoretic.  Eyes:     Conjunctiva/sclera: Conjunctivae normal.     Pupils: Pupils are equal, round, and reactive to light.  Neck:     Musculoskeletal: Normal range of motion.  Cardiovascular:     Rate and Rhythm: Tachycardia present. Rhythm irregular.     Heart sounds: Normal heart sounds. No murmur.  Pulmonary:     Effort: Pulmonary effort is normal. No tachypnea, accessory muscle usage or respiratory distress.     Breath sounds: Normal breath sounds. Decreased air movement (secondary to body habitus) present. No wheezing or rales.  Abdominal:     General: Bowel sounds are normal. There is no distension.     Palpations: Abdomen is soft.     Tenderness: There is no abdominal tenderness. There is no guarding or rebound.  Genitourinary:    Comments: Induration and tenderness of left perineum without evidence of cellulitis. No discharge noted. Musculoskeletal: Normal range of motion.        General: No tenderness.     Right lower leg: No edema.     Left lower leg: No edema.     Comments: Right arm fistula with palpable thrill and audible bruit. No  overlying erythema or tenderness on palpation.  Lymphadenopathy:     Cervical: No cervical adenopathy.  Skin:    General: Skin is warm and dry.  Neurological:     Mental Status: She is alert and oriented to person, place, and time.      Labs on Admission: I have personally reviewed following labs and imaging studies  CBC: Recent Labs  Lab 03/11/2019 2120  WBC 16.2*  NEUTROABS 13.2*  HGB 11.9*  HCT 42.3  MCV 94.4  PLT 148*    Basic Metabolic Panel: Recent Labs  Lab 03/02/2019 2157  NA 142  K 5.2*  CL 103  CO2 26  GLUCOSE 177*  BUN 72*  CREATININE 3.65*  CALCIUM 8.4*    GFR: Estimated Creatinine Clearance: 15.7 mL/min (A) (by C-G formula based on SCr of 3.65 mg/dL (H)).  Liver Function Tests: Recent Labs  Lab 03/28/2019 2157  AST 57*  ALT 62*  ALKPHOS 71  BILITOT 1.8*  PROT 5.2*  ALBUMIN 2.7*   No results for input(s): LIPASE, AMYLASE in the last 168 hours. No results for input(s): AMMONIA in the last 168 hours.  Coagulation Profile: Recent Labs  Lab 03/25/2019 2157  INR 2.2*    Cardiac Enzymes: Recent Labs  Lab 03/09/2019 2204  TROPONINI 0.09*    BNP (last 3 results) No results for input(s): PROBNP in the last 8760 hours.  HbA1C: No results for input(s): HGBA1C in the last 72 hours.  CBG: No results for input(s): GLUCAP in the last 168 hours.  Lipid Profile: No results for input(s): CHOL, HDL, LDLCALC, TRIG, CHOLHDL, LDLDIRECT in the last 72 hours.  Thyroid Function Tests: No results for input(s): TSH, T4TOTAL, FREET4, T3FREE, THYROIDAB in the last 72 hours.  Anemia Panel: No results for input(s): VITAMINB12, FOLATE, FERRITIN, TIBC, IRON, RETICCTPCT in the last 72 hours.  Urine analysis:    Component Value Date/Time   COLORURINE STRAW (A) 01/24/2019 [place] 01/24/2019 1645   LABSPEC 1.008 01/24/2019 1645   PHURINE 5.0 01/24/2019 [place] 01/24/2019 1645   HGBUR NEGATIVE 01/24/2019 1645    BILIRUBINUR NEGATIVE 01/24/2019 1645   KETONESUR NEGATIVE 01/24/2019 1645   PROTEINUR  30 (A) 01/24/2019 1645   UROBILINOGEN 0.2 04/28/2010 0304   NITRITE NEGATIVE 01/24/2019 1645   LEUKOCYTESUR NEGATIVE 01/24/2019 1645     Radiological Exams on Admission: Ct Pelvis Wo Contrast  Result Date: 03/07/2019 CLINICAL DATA:  73 year old female with perineum abscess. EXAM: CT PELVIS WITHOUT CONTRAST TECHNIQUE: Multidetector CT imaging of the pelvis was performed following the standard protocol without intravenous contrast. COMPARISON:  None. FINDINGS: Urinary Tract: Kidneys not included. Diminutive and unremarkable urinary bladder. Distal ureters are decompressed. Incidental pelvic phleboliths. Bowel:  Negative visible large and small bowel. Vascular/Lymphatic: Aortoiliac calcified atherosclerosis. Vascular patency is not evaluated in the absence of IV contrast. Inguinal and pelvic lymph nodes appear symmetric and within normal limits. Reproductive:  Surgically absent. Other: Large body habitus. Left posterior perineum inflammatory stranding which may be contiguous with the anus on series 3, image 103. In the superficial medial left buttock there is a 29 x 19 x 35 millimeter round subcutaneous abscess. See series 3, image 110 and series 8, image 118. Regional inflammatory stranding. No soft tissue gas. Musculoskeletal: No acute osseous abnormality identified. Degenerative changes in the lower lumbar spine. IMPRESSION: Superficial left perineum abscess with questionable communication to the anus measures 29 x 19 x 35 mm. See series 8, image 122. Regional cellulitis. No subcutaneous gas. Electronically Signed   By: Genevie Ann M.D.   On: 03/28/2019 23:49    EKG: Independently reviewed. Fast rate, irregular rhythm. Unchanged from previous baseline.   Assessment/Plan Principal Problem:   Perineal abscess Active Problems:   DM (diabetes mellitus), type 2 with renal complications ([facility]CC)   Essential hypertension    Morbid obesity ([facility])   Paroxysmal atrial fibrillation ([facility])   CKD (chronic kidney disease) stage 4, GFR 15-29 ml/min ([facility])   OSA (obstructive sleep apnea)   Acute respiratory failure with hypoxia ([facility])   RA (rheumatoid arthritis) ([place])    Perineal abscess General surgery consulted. No associated erythema with no concern for cellulitis at this time -Zosyn IV per pharmacy -Blood cultures (prior to antibiotics) -General surgery recommendations -Dilaudid prn in setting of NPO status and CKD  Sepsis Present on admission. Leukocytosis and tachycardia. Secondary to cellulitis/abscess. Received fluids in the ED. Hemodynamically stable. -Management as mentioned above  Atrial fibrillation with RVR Patient follows with cardiology as an outpatient. On Coreg, Diltiazem and coumadin. Goal INR of 2-3. Rate improved in the ED with fluid resuscitation and addition of home Coreg which she did not take this evening. -Continue diltiazem and Coreg -Hold Coumadin pending general surgery evaluation recommendations -Heparin drip for INR <2 since possibility she may go for surgery  Elevated troponin Likely secondary to demand ischemia in setting of infection in addition to atrial fibrillation w/ RVR. No chest pain. No EKG changes.  Rheumatoid arthritis -Continue prednisone -Hold Arava in setting of acute infection  Diabetes mellitus, type 2 Patient's last hemoglobin A1C of 5.9 in May of 2019. Diet controlled as an outpatient -SSI  Hyperkalemia In setting of CKD but also takes potassium supplementation with lasix as an outpatient. -Telemetry -Recheck potassium in AM  Acute respiratory failure with hypoxia Patient has a history of this but does not have chronic issues. Occurred when patient first arrived, not after fluids. Per nurse, lowest SpO2 of 89%, which warranted starting oxygen. -Wean oxygen to room air as tolerated  CKD stage IV Patient follows with [facility]. Baseline creatinine of 3.5-4.  Currently at baseline. Fistula placed earlier this year. Hope is to stave off HD for as long as  possible. -Adjust medications for kidney function -Continue Calcitriol -Hold magnesium oxide; add on magnesium lab  Essential hypertension -Continue Coreg, diltiazem, hydralazine  Chronic diastolic heart failure Last Transthoracic Echocardiogram significant for an EF of >65% in January of 2020. Diastolic function not assessed secondary to atrial fibrillation. Currently euvolemic -Hold furosemide overnight in addition to potassium  Elevated transaminases Patient is on Lipitor as an outpatient, but likely transient in setting of sepsis picture. -Repeat CMP in AM  Morbid obesity Body mass index is 41.88 kg/m.   DVT prophylaxis: Coumadin Code Status: Full code Family Communication: None at bedside Disposition Plan: Telemetry floor since was previously in RVR Consults called: General surgery by EDP Admission status: Inpatient   Cordelia Poche, MD [facility] 03/27/2019, 12:21 AM  If 7PM-7AM, please contact night-coverage [email or web address] Password [email or web address]

## 2019-03-24 NOTE — Anesthesia Preprocedure Evaluation (Addendum)
Anesthesia Evaluation  Patient identified by MRN, date of birth, ID band Patient awake    Reviewed: Allergy & Precautions, NPO status , Patient's Chart, lab work & pertinent test results  Airway Mallampati: II  TM Distance: >3 FB Neck ROM: Full    Dental  (+) Partial Lower, Missing   Pulmonary sleep apnea and Continuous Positive Airway Pressure Ventilation , former smoker,    Pulmonary exam normal breath sounds clear to auscultation       Cardiovascular hypertension, Pt. on home beta blockers and Pt. on medications +CHF  Normal cardiovascular exam+ dysrhythmias Atrial Fibrillation  Rhythm:Regular Rate:Normal  ECG: A-fib, rate 140. Atrial fibrillation Left posterior fascicular block  ECHO: 1. The left ventricle appears to be normal in size, has mild wall thickness >65% ejection fraction. 2. Right ventricular systolic pressure is is normal. 3. The right ventricle has normal size and normal systolic function. 4. There is moderate calcification of the aortic valve. 5. There is moderate thickening of the aortic valve. No aortic stenosis 6. Aortic valve regurgitation is mild by color flow Doppler. 7. The mitral valve is myxomatous.     Neuro/Psych negative neurological ROS  negative psych ROS   GI/Hepatic Neg liver ROS, GERD  Medicated and Controlled,  Endo/Other  diabetesMorbid obesity  Renal/GU CRFRenal disease     Musculoskeletal  (+) Arthritis , Rheumatoid disorders,    Abdominal (+) + obese,   Peds  Hematology HLD   Anesthesia Other Findings Perianal abscess  Reproductive/Obstetrics                           Anesthesia Physical Anesthesia Plan  ASA: III  Anesthesia Plan: General   Post-op Pain Management:    Induction: Intravenous and Rapid sequence  PONV Risk Score and Plan:   Airway Management Planned: Oral ETT  Additional Equipment:   Intra-op Plan:   Post-operative  Plan: Extubation in OR  Informed Consent: I have reviewed the patients History and Physical, chart, labs and discussed the procedure including the risks, benefits and alternatives for the proposed anesthesia with the patient or authorized representative who has indicated his/her understanding and acceptance.     Dental advisory given  Plan Discussed with: CRNA  Anesthesia Plan Comments:         Anesthesia Quick Evaluation

## 2019-03-24 NOTE — ED Notes (Signed)
Report given to 6N. All questions answered

## 2019-03-24 NOTE — Consult Note (Signed)
Surgical Consultation Requesting provider: Dr. Christy Gentles  CC: perianal abscess  HPI: Very pleasant 73yo woman, on coumadin, with multiple medical problems as listed below who presented to the ER earlier today with c/o generalized weakness, nausea, emesis, and diarrhea since yesterday morning. Also noted low-grade temp of 100.7. She was found to be in afib RVR, hypoxic with sat 89% which has since improved with some resuscitation and support, though she remains in rapid afib.  She noted a boil on her left buttock which she had noticed yesterday as well.  She is noted to have a leukocytosis. A CT scan was performed to further evaluate this area, confirming a perianal abscess and general surgery is consulted. She denies prior similar problems or perianal procedures. States she is due for a colonoscopy but on her last one she had some polyps and does endorse a history of hemorrhoids.   Allergies  Allergen Reactions  . Enalapril Other (See Comments)    Systemic reaction - jerking  . Codeine Itching    Past Medical History:  Diagnosis Date  . Anemia   . CHF (congestive heart failure) ([place])   . CKD (chronic kidney disease), stage IV ([place])   . Complication of anesthesia    " I aspirated during my gallbladder surgery"  . Diabetes ([place])   . GERD (gastroesophageal reflux disease)   . Heart murmur   . HTN (hypertension)   . Morbid obesity with BMI of 45.0-49.9, adult ([place])   . OSA (obstructive sleep apnea)    with AHI 11/hr with nocturnal hypoxemia , uses cpap  . PAF (paroxysmal atrial fibrillation) ([place])    s/p DCCV in 8/17  . Pneumonia   . RA (rheumatoid arthritis) ([place])   . Wears glasses   . Wears partial dentures     Past Surgical History:  Procedure Laterality Date  . ABDOMINAL HYSTERECTOMY    . ANKLE FUSION Right   . AV FISTULA PLACEMENT Right 07/14/2018   Procedure: ARTERIOVENOUS (AV) FISTULA CREATION BRACHIOCEPHALIC;  Surgeon: Elam Dutch, MD;  Location: [facility];  Service:  Vascular;  Laterality: Right;  . BREAST BIOPSY    . CARDIOVERSION N/A 08/15/2016   Procedure: CARDIOVERSION;  Surgeon: Jerline Pain, MD;  Location: [facility] ENDOSCOPY;  Service: Cardiovascular;  Laterality: N/A;  . CHOLECYSTECTOMY    . COLONOSCOPY W/ BIOPSIES AND POLYPECTOMY    . DILATION AND CURETTAGE OF UTERUS    . FISTULA SUPERFICIALIZATION Right 01/03/2019   Procedure: SUPERFICIALIZATION OF RIGHT ARM FISTULA;  Surgeon: Elam Dutch, MD;  Location: [facility];  Service: Vascular;  Laterality: Right;  . GALLBLADDER SURGERY    . HYSTEROTOMY     Patrtial  . HYSTEROTOMY     Complete  . KNEE ARTHROSCOPY    . LEFT HEART CATH AND CORONARY ANGIOGRAPHY N/A 11/16/2017   Procedure: LEFT HEART CATH AND CORONARY ANGIOGRAPHY;  Surgeon: Martinique, Peter M, MD;  Location: [place] CV LAB;  Service: Cardiovascular;  Laterality: N/A;  . MULTIPLE TOOTH EXTRACTIONS    . TUBAL LIGATION      Family History  Problem Relation Age of Onset  . Hypertension Mother        Family HX Diabetes  . Stomach cancer Father     Social History   Socioeconomic History  . Marital status: Married    Spouse name: Not on file  . Number of children: Not on file  . Years of education: Not on file  . Highest education level: Not on file  Occupational History  . Not on file  Social Needs  . Financial resource strain: Not on file  . Food insecurity:    Worry: Not on file    Inability: Not on file  . Transportation needs:    Medical: Not on file    Non-medical: Not on file  Tobacco Use  . Smoking status: Former Smoker    Types: Cigarettes    Last attempt to quit: 04/08/2016    Years since quitting: 2.9  . Smokeless tobacco: Never Used  Substance and Sexual Activity  . Alcohol use: No    Alcohol/week: 0.0 standard drinks  . Drug use: No  . Sexual activity: Not on file  Lifestyle  . Physical activity:    Days per week: Not on file    Minutes per session: Not on file  . Stress: Not on file  Relationships  .  Social connections:    Talks on phone: Not on file    Gets together: Not on file    Attends religious service: Not on file    Active member of club or organization: Not on file    Attends meetings of clubs or organizations: Not on file    Relationship status: Not on file  Other Topics Concern  . Not on file  Social History Narrative  . Not on file    No current facility-administered medications on file prior to encounter.    Current Outpatient Medications on File Prior to Encounter  Medication Sig Dispense Refill  . atorvastatin (LIPITOR) 20 MG tablet Take 20 mg by mouth daily.    . brimonidine-timolol (COMBIGAN) 0.2-0.5 % ophthalmic solution Place 1 drop into both eyes 2 (two) times daily.    . calcitRIOL (ROCALTROL) 0.25 MCG capsule Take 0.25 mcg by mouth. Take 1 capsule every Monday, Wednesday, and Friday    . carvedilol (COREG) 25 MG tablet TAKE 1 TABLET BY MOUTH TWICE A DAY WITH A MEAL (Patient taking differently: Take 25 mg by mouth 2 (two) times daily with a meal. TAKE 1 TABLET BY MOUTH TWICE A DAY WITH A MEAL) 180 tablet 2  . Darbepoetin Alfa (ARANESP) 60 MCG/0.3ML SOSY injection Inject 60 mcg every 30 (thirty) days into the skin.    Marland Kitchen diltiazem (CARDIZEM CD) 120 MG 24 hr capsule Take 120 mg by mouth daily.    . diphenhydramine-acetaminophen (TYLENOL PM) 25-500 MG TABS tablet Take 1 tablet by mouth at bedtime.     . febuxostat (ULORIC) 40 MG tablet Take 40 mg by mouth daily. Pt will check with her provider on when to start this medication    . furosemide (LASIX) 20 MG tablet Take 80 mg by mouth 2 (two) times daily.     . hydrALAZINE (APRESOLINE) 50 MG tablet Take 50 mg by mouth 3 (three) times daily.     Marland Kitchen KLOR-CON M20 20 MEQ tablet TAKE 1 TABLET BY MOUTH EVERY DAY (Patient taking differently: Take 20 mEq by mouth daily. ) 90 tablet 3  . latanoprost (XALATAN) 0.005 % ophthalmic solution Place 1 drop into both eyes at bedtime.    Marland Kitchen leflunomide (ARAVA) 10 MG tablet Take 10 mg by  mouth daily.     . Magnesium Oxide 400 (240 Mg) MG TABS Take 400 mg by mouth 2 (two) times daily.  6  . pantoprazole (PROTONIX) 40 MG tablet Take 1 tablet (40 mg total) by mouth daily. 30 tablet 0  . predniSONE (DELTASONE) 5 MG tablet Take 5 mg by mouth daily.     Marland Kitchen  PRESCRIPTION MEDICATION Inhale into the lungs at bedtime. CPAP    . Probiotic Product (ALIGN PO) Take 1 tablet by mouth at bedtime.     . vitamin B-12 (CYANOCOBALAMIN) 1000 MCG tablet Take 1,000 mcg by mouth daily.    Marland Kitchen warfarin (COUMADIN) 5 MG tablet Take as directed by [facility] (Patient taking differently: Take 2.5 mg by mouth daily. Take as directed by [facility]) 30 tablet 1    Review of Systems: a complete, 10pt review of systems was completed with pertinent positives and negatives as documented in the HPI  Physical Exam: Vitals:   03/13/2019 2230 03/09/2019 2241  BP: 127/80 127/80  Pulse: (!) 113 (!) 127  Resp: 13   Temp:    SpO2: 97%    Gen: A&Ox3, no distress  Head: normocephalic, atraumatic Eyes: extraocular motions intact, anicteric.  Neck: supple without mass or thyromegaly Chest: unlabored respirations, symmetrical air entry, clear bilaterally   Cardiovascular: RRR with palpable distal pulses, no pedal edema Abdomen: soft, nondistended, nontender. No mass or organomegaly.  Extremities: warm, without edema, no deformities  Neuro: grossly intact Psych: appropriate mood and affect, normal insight  Skin: warm and dry Rectal: there is an appx 3cm area of induration and tenderness in the left perianal soft tissue. There are a few pedunculated skin tags vs condyloma. No area of drainage.   CBC Latest Ref Rng & Units 03/28/2019 03/09/2019 03/09/2019  WBC 4.0 - 10.5 K/uL 16.2(H) 13.9(H) -  Hemoglobin 12.0 - 15.0 g/dL 11.9(L) 10.9(L) 11.1(L)  Hematocrit 36.0 - 46.0 % 42.3 38.5 -  Platelets 150 - 400 K/uL 148(L) 215 -    CMP Latest Ref Rng & Units 03/04/2019 03/09/2019 01/31/2019  Glucose 70 - 99 mg/dL 177(H)  - 120(H)  BUN 8 - 23 mg/dL 72(H) - 76(H)  Creatinine 0.44 - 1.00 mg/dL 3.65(H) - 4.13(H)  Sodium 135 - 145 mmol/L 142 - 144  Potassium 3.5 - 5.1 mmol/L 5.2(H) - 4.3  Chloride 98 - 111 mmol/L 103 - 100  CO2 22 - 32 mmol/L 26 - 31  Calcium 8.9 - 10.3 mg/dL 8.4(L) 8.8 8.9  Total Protein 6.5 - 8.1 g/dL 5.2(L) - -  Total Bilirubin 0.3 - 1.2 mg/dL 1.8(H) - -  Alkaline Phos 38 - 126 U/L 71 - -  AST 15 - 41 U/L 57(H) - -  ALT 0 - 44 U/L 62(H) - -    Lab Results  Component Value Date   INR 2.2 (H) 03/18/2019   INR 3.5 (A) 03/15/2019   INR 5.1 (A) 03/09/2019    Imaging: Ct Pelvis Wo Contrast  Result Date: 03/22/2019 CLINICAL DATA:  73 year old female with perineum abscess. EXAM: CT PELVIS WITHOUT CONTRAST TECHNIQUE: Multidetector CT imaging of the pelvis was performed following the standard protocol without intravenous contrast. COMPARISON:  None. FINDINGS: Urinary Tract: Kidneys not included. Diminutive and unremarkable urinary bladder. Distal ureters are decompressed. Incidental pelvic phleboliths. Bowel:  Negative visible large and small bowel. Vascular/Lymphatic: Aortoiliac calcified atherosclerosis. Vascular patency is not evaluated in the absence of IV contrast. Inguinal and pelvic lymph nodes appear symmetric and within normal limits. Reproductive:  Surgically absent. Other: Large body habitus. Left posterior perineum inflammatory stranding which may be contiguous with the anus on series 3, image 103. In the superficial medial left buttock there is a 29 x 19 x 35 millimeter round subcutaneous abscess. See series 3, image 110 and series 8, image 118. Regional inflammatory stranding. No soft tissue gas. Musculoskeletal: No acute osseous abnormality  identified. Degenerative changes in the lower lumbar spine. IMPRESSION: Superficial left perineum abscess with questionable communication to the anus measures 29 x 19 x 35 mm. See series 8, image 122. Regional cellulitis. No subcutaneous gas.  Electronically Signed   By: Genevie Ann M.D.   On: 03/24/2019 23:49      A/P: 72yo with multiple medical problems including a fib on coumadin. Perianal abscess.  -Please continue IV antibiotics -Keep NPO for now and hold anticoagulation -Will likely require I&D will reassess in AM.    Romana Juniper, MD [facility] Surgery, [place] Pager [phone]

## 2019-03-25 ENCOUNTER — Encounter (HOSPITAL_COMMUNITY): Payer: Self-pay | Admitting: Surgery

## 2019-03-25 ENCOUNTER — Other Ambulatory Visit: Payer: Self-pay | Admitting: *Deleted

## 2019-03-25 DIAGNOSIS — L02215 Cutaneous abscess of perineum: Secondary | ICD-10-CM

## 2019-03-25 LAB — PREPARE FRESH FROZEN PLASMA: Unit division: 0

## 2019-03-25 LAB — BPAM FFP
Blood Product Expiration Date: 202003292359
ISSUE DATE / TIME: 202003260902
Unit Type and Rh: 6200

## 2019-03-25 LAB — PROTIME-INR
INR: 2.6 — ABNORMAL HIGH (ref 0.8–1.2)
PROTHROMBIN TIME: 27.2 s — AB (ref 11.4–15.2)

## 2019-03-25 LAB — GLUCOSE, CAPILLARY
GLUCOSE-CAPILLARY: 247 mg/dL — AB (ref 70–99)
Glucose-Capillary: 162 mg/dL — ABNORMAL HIGH (ref 70–99)
Glucose-Capillary: 140 mg/dL — ABNORMAL HIGH (ref 70–99)
Glucose-Capillary: 153 mg/dL — ABNORMAL HIGH (ref 70–99)

## 2019-03-25 MED ORDER — OXYCODONE-ACETAMINOPHEN 5-325 MG PO TABS
1.0000 | ORAL_TABLET | ORAL | Status: DC | PRN
  Administered 2019-03-26 – 2019-03-28 (×5): 1 via ORAL
  Filled 2019-03-25 (×5): qty 1

## 2019-03-25 MED ORDER — ATORVASTATIN CALCIUM 10 MG PO TABS
20.0000 mg | ORAL_TABLET | Freq: Every day | ORAL | Status: DC
  Administered 2019-03-25 – 2019-03-28 (×4): 20 mg via ORAL
  Filled 2019-03-25 (×4): qty 2

## 2019-03-25 MED ORDER — SODIUM CHLORIDE 0.9 % BOLUS PEDS
500.0000 mL | Freq: Once | INTRAVENOUS | Status: AC
  Administered 2019-03-25: 500 mL via INTRAVENOUS

## 2019-03-25 MED ORDER — PHENOL 1.4 % MT LIQD
1.0000 | OROMUCOSAL | Status: DC | PRN
  Administered 2019-03-25: 1 via OROMUCOSAL
  Filled 2019-03-25: qty 177

## 2019-03-25 MED ORDER — DICYCLOMINE HCL 10 MG/5ML PO SOLN
10.0000 mg | Freq: Once | ORAL | Status: AC
  Administered 2019-03-25: 10 mg via ORAL
  Filled 2019-03-25: qty 5

## 2019-03-25 MED ORDER — LEFLUNOMIDE 20 MG PO TABS
10.0000 mg | ORAL_TABLET | Freq: Every day | ORAL | Status: DC
  Filled 2019-03-25: qty 0.5

## 2019-03-25 NOTE — Consult Note (Signed)
   [facility] Inpatient Consult   03/25/2019  Melissa Bartlett 09-30-1946 [id number]   Patient is currently active with [facility] for chronic disease management services.  Patient has been engaged by a [facility].  Our community based plan of care has focused on disease management for care management as well.   Will follow patient for progression as she is currently in the Medical ICU.  Chart reviewed and MD notes are as follow:  73 year old woman who was referred to critical care for postoperative hypotension following excision and drainage of a peri-anal abscess. She presented with 2-day history of perianal pain.  She reported diarrhea and anorexia. Will notify [facility] staff of patient's hospitalization.  [facility] Care Management services does not replace or interfere with any services that are needed or arranged by inpatient case management or social work.   For additional questions or referrals please contact:  Natividad Brood, RN BSN [facility] Liaison  [phone] business mobile phone Toll free office [phone]

## 2019-03-25 NOTE — Progress Notes (Addendum)
Patient did receive 1 dose of Dilaudid-about 30 minutes to an hour ago   While sitting on a commode She stated she felt woozy  Blood pressure was low systolic in the 15K  Transferred into the bed with assistance Giving her 500 cc of saline bolus  Hold all antihypertensives  We will keep in the unit  Blood sugar 149 Patient mentating well, able to identify members of staff in the room Still feels a little bit tired  Denies any chest pains, no acute shortness of breath  Hold transfer at present  Cardizem and carvedilol held

## 2019-03-25 NOTE — Progress Notes (Signed)
1 Day Post-Op  Subjective: CC: Buttock pain Patient reports some burning sensation of her buttock when she leans on the left side. Tolerating diet. No N/V. Passing some flatus. No BM.  Objective: Vital signs in last 24 hours: Temp:  [97.5 F (36.4 C)-97.6 F (36.4 C)] 97.5 F (36.4 C) (03/27 0743) Pulse Rate:  [56-105] 105 (03/26 1800) Resp:  [12-21] 14 (03/26 1800) BP: (105-121)/(56-84) 115/76 (03/26 1600) SpO2:  [84 %-100 %] 100 % (03/26 1800) Arterial Line BP: (108-153)/(54-78) 108/63 (03/27 0600) Weight:  [103.8 kg] 103.8 kg (03/26 0836) Last BM Date: 03/14/2019  Intake/Output from previous day: 03/26 0701 - 03/27 0700 In: 1948 [P.O.:480; I.V.:900; Blood:193; IV Piggyback:375] Out: 25 [Blood:25] Intake/Output this shift: No intake/output data recorded.  PE: Gen: Awake and alert, NAD Heart: Tachycardic, 120-130 on monitor while I was in the room Lungs: CTA b/l, normal effort Abd: Soft, NT, ND, +BS GU: Left perianal incision without purulent drainage on gauze. Packing removed. Incision is open and clean with healthy base of granulation tissue. No surrounding induration or further areas of fluctuance. Right inferior labial wound appears clean without drainage. There is a healthy base of granulation tissue. Areas were redressed.   Lab Results:  Recent Labs    03/28/2019 2120 02/28/2019 0543  WBC 16.2* 14.1*  HGB 11.9* 11.0*  HCT 42.3 37.4  PLT 148* 132*   BMET Recent Labs    03/09/2019 2157 03/07/2019 0543  NA 142 142  K 5.2* 4.5  CL 103 107  CO2 26 26  GLUCOSE 177* 120*  BUN 72* 75*  CREATININE 3.65* 4.19*  CALCIUM 8.4* 7.9*   PT/INR Recent Labs    03/16/2019 0543 03/25/19 0300  LABPROT 24.4* 27.2*  INR 2.2* 2.6*   CMP     Component Value Date/Time   NA 142 03/03/2019 0543   NA 143 03/10/2018 1015   K 4.5 03/04/2019 0543   CL 107 03/11/2019 0543   CO2 26 03/26/2019 0543   GLUCOSE 120 (H) 03/13/2019 0543   BUN 75 (H) 03/12/2019 0543   BUN 42 (H)  03/10/2018 1015   CREATININE 4.19 (H) 03/22/2019 0543   CREATININE 2.22 (H) 08/01/2016 1345   CALCIUM 7.9 (L) 02/27/2019 0543   CALCIUM 8.8 03/09/2019 1418   PROT 5.1 (L) 03/22/2019 0543   ALBUMIN 2.6 (L) 03/25/2019 0543   AST 60 (H) 03/26/2019 0543   ALT 59 (H) 03/11/2019 0543   ALKPHOS 70 03/22/2019 0543   BILITOT 1.3 (H) 03/09/2019 0543   GFRNONAA 10 (L) 03/15/2019 0543   GFRAA 12 (L) 03/03/2019 0543   Lipase     Component Value Date/Time   LIPASE 33 01/24/2019 1530       Studies/Results: Ct Pelvis Wo Contrast  Result Date: 03/27/2019 CLINICAL DATA:  73 year old female with perineum abscess. EXAM: CT PELVIS WITHOUT CONTRAST TECHNIQUE: Multidetector CT imaging of the pelvis was performed following the standard protocol without intravenous contrast. COMPARISON:  None. FINDINGS: Urinary Tract: Kidneys not included. Diminutive and unremarkable urinary bladder. Distal ureters are decompressed. Incidental pelvic phleboliths. Bowel:  Negative visible large and small bowel. Vascular/Lymphatic: Aortoiliac calcified atherosclerosis. Vascular patency is not evaluated in the absence of IV contrast. Inguinal and pelvic lymph nodes appear symmetric and within normal limits. Reproductive:  Surgically absent. Other: Large body habitus. Left posterior perineum inflammatory stranding which may be contiguous with the anus on series 3, image 103. In the superficial medial left buttock there is a 29 x 19 x 35 millimeter  round subcutaneous abscess. See series 3, image 110 and series 8, image 118. Regional inflammatory stranding. No soft tissue gas. Musculoskeletal: No acute osseous abnormality identified. Degenerative changes in the lower lumbar spine. IMPRESSION: Superficial left perineum abscess with questionable communication to the anus measures 29 x 19 x 35 mm. See series 8, image 122. Regional cellulitis. No subcutaneous gas. Electronically Signed   By: Genevie Ann M.D.   On: 03/09/2019 23:49   Dg Chest  Port 1 View  Result Date: 02/27/2019 CLINICAL DATA:  Shortness of breath. EXAM: PORTABLE CHEST 1 VIEW COMPARISON:  01/29/2019 FINDINGS: The cardiac silhouette remains enlarged. Small volume loculated fluid in the right minor fissure is stable to slightly increased. There is minimal atelectasis in the left lung base. The interstitial markings are slightly prominent without overt edema. No pneumothorax is identified. No acute osseous abnormality is seen. IMPRESSION: Cardiomegaly with persistent small volume pleural fluid in the right minor fissure and minimal left basilar atelectasis. Electronically Signed   By: Logan Bores M.D.   On: 03/04/2019 13:54    Anti-infectives: Anti-infectives (From admission, onward)   Start     Dose/Rate Route Frequency Ordered Stop   03/10/2019 0115  piperacillin-tazobactam (ZOSYN) IVPB 2.25 g     2.25 g 100 mL/hr over 30 Minutes Intravenous Every 6 hours 02/28/2019 0103     03/12/2019 0030  vancomycin (VANCOCIN) 1,500 mg in sodium chloride 0.9 % 500 mL IVPB  Status:  Discontinued     1,500 mg 250 mL/hr over 120 Minutes Intravenous  Once 03/12/2019 0016 03/11/2019 0054   03/26/2019 0015  vancomycin (VANCOCIN) IVPB 1000 mg/200 mL premix  Status:  Discontinued     1,000 mg 200 mL/hr over 60 Minutes Intravenous  Once 03/08/2019 0013 03/03/2019 0016   02/28/2019 0015  cefTRIAXone (ROCEPHIN) 2 g in sodium chloride 0.9 % 100 mL IVPB  Status:  Discontinued     2 g 200 mL/hr over 30 Minutes Intravenous  Once 03/12/2019 0013 03/09/2019 0054       Assessment/Plan Post-Op hypotension - Appreciate CCM management. Being transferred out of unit to floor today.  Stage IV CKD CHF A-Fib Hx of RA  Perianal abscess Perineal Condyloma Right labial condyloma  - s/p I&D of left perianal abscess, excision of condyloma of right labia and perineal, 03/01/2019, CW - Packing removed. BID dressing changes - Sitz baths BID and after BMs - CX pending - few wbc present, moderate gram positive cocci in  pairs, moderate gram variable rods.  - D/c Abx  - Will follow. Okay for d/c from a surgical standpoint once she urinates. Per medicine    FEN - HH diet VTE - SCSs, okay for chemical prophylaxis from a surgical standpoint.  ID - Rocephin/Vanc 3/26, Zosyn 3/26 - 3/27  Follow up - Will arrange and place in AVS   LOS: 1 day    Jillyn Ledger , [facility] Surgery 03/25/2019, 7:52 AM Pager: [phone]

## 2019-03-25 NOTE — Progress Notes (Signed)
NAME:  Melissa Bartlett, MRN:  [id number], DOB:  09-14-1946, LOS: 1 ADMISSION DATE:  03/22/2019, CONSULTATION DATE: 03/12/2019 REFERRING MD: [facility], CHIEF COMPLAINT: Hypotension  HPI/course in hospital  73 year old woman who was referred to critical care for postoperative hypotension following excision and drainage of a peri-anal abscess. She presented with 2-day history of perianal pain.  She reported diarrhea and anorexia. She was hypotensive soon after induction and required phenylephrine throughout the case but only received 250 mL of colloid.  Past Medical History   Past Medical History:  Diagnosis Date   Anemia    CHF (congestive heart failure) (HCC)    CKD (chronic kidney disease), stage IV (HCC)    Complication of anesthesia    " I aspirated during my gallbladder surgery"   Diabetes (HCC)    GERD (gastroesophageal reflux disease)    Heart murmur    HTN (hypertension)    Morbid obesity with BMI of 45.0-49.9, adult (HCC)    OSA (obstructive sleep apnea)    with AHI 11/hr with nocturnal hypoxemia , uses cpap   PAF (paroxysmal atrial fibrillation) (HCC)    s/p DCCV in 8/17   Pneumonia    RA (rheumatoid arthritis) ([place])    Wears glasses    Wears partial dentures      Past Surgical History:  Procedure Laterality Date   ABDOMINAL HYSTERECTOMY     ANKLE FUSION Right    AV FISTULA PLACEMENT Right 07/14/2018   Procedure: ARTERIOVENOUS (AV) FISTULA CREATION BRACHIOCEPHALIC;  Surgeon: Elam Dutch, MD;  Location: [place];  Service: Vascular;  Laterality: Right;   BREAST BIOPSY     CARDIOVERSION N/A 08/15/2016   Procedure: CARDIOVERSION;  Surgeon: Jerline Pain, MD;  Location: [place];  Service: Cardiovascular;  Laterality: N/A;   CHOLECYSTECTOMY     COLONOSCOPY W/ BIOPSIES AND POLYPECTOMY     CONDYLOMA EXCISION/FULGURATION N/A 03/02/2019   Procedure: Condyloma Removal;  Surgeon: Ileana Roup, MD;  Location: [place];   Service: General;  Laterality: N/A;   DILATION AND CURETTAGE OF UTERUS     FISTULA SUPERFICIALIZATION Right 01/03/2019   Procedure: SUPERFICIALIZATION OF RIGHT ARM FISTULA;  Surgeon: Elam Dutch, MD;  Location: [place];  Service: Vascular;  Laterality: Right;   GALLBLADDER SURGERY     HYSTEROTOMY     Patrtial   HYSTEROTOMY     Complete   INCISION AND DRAINAGE PERIRECTAL ABSCESS N/A 03/04/2019   Procedure: IRRIGATION AND DEBRIDEMENT PERIANAL ABSCESS;  Surgeon: Ileana Roup, MD;  Location: [facility];  Service: General;  Laterality: N/A;   KNEE ARTHROSCOPY     LEFT HEART CATH AND CORONARY ANGIOGRAPHY N/A 11/16/2017   Procedure: LEFT HEART CATH AND CORONARY ANGIOGRAPHY;  Surgeon: Martinique, Peter M, MD;  Location: [facility];  Service: Cardiovascular;  Laterality: N/A;   MULTIPLE TOOTH EXTRACTIONS     RECTAL EXAM UNDER ANESTHESIA N/A 03/17/2019   Procedure: Rectal Exam Under Anesthesia;  Surgeon: Ileana Roup, MD;  Location: [place];  Service: General;  Laterality: N/A;   TUBAL LIGATION       Review of Systems:   Review of Systems  Constitutional: Negative.   HENT: Negative.   Eyes: Negative.   Respiratory: Negative for cough, sputum production and shortness of breath.        Sore throat, minimal bleeding  Cardiovascular: Negative for chest pain, leg swelling and PND.  Gastrointestinal: Negative.   Genitourinary: Negative.   Musculoskeletal: Negative.   All  other systems reviewed and are negative.   Social History   reports that she quit smoking about 2 years ago. Her smoking use included cigarettes. She has never used smokeless tobacco. She reports that she does not drink alcohol or use drugs.   Family History   Her family history includes Hypertension in her mother; Stomach cancer in her father.   Allergies Allergies  Allergen Reactions   Enalapril Other (See Comments)    Systemic reaction - jerking   Codeine Itching     Home Medications    Prior to Admission medications   Medication Sig Start Date End Date Taking? Authorizing Provider  atorvastatin (LIPITOR) 20 MG tablet Take 20 mg by mouth daily.   Yes [provider]  brimonidine-timolol (COMBIGAN) 0.2-0.5 % ophthalmic solution Place 1 drop into both eyes 2 (two) times daily.   Yes [provider]  calcitRIOL (ROCALTROL) 0.25 MCG capsule Take 0.25 mcg by mouth. Take 1 capsule every Monday, Wednesday, and Friday   Yes [provider]  carvedilol (COREG) 25 MG tablet TAKE 1 TABLET BY MOUTH TWICE A DAY WITH A MEAL Patient taking differently: Take 25 mg by mouth 2 (two) times daily with a meal. TAKE 1 TABLET BY MOUTH TWICE A DAY WITH A MEAL 06/21/18  Yes Jerline Pain, MD  Darbepoetin Alfa (ARANESP) 60 MCG/0.3ML SOSY injection Inject 60 mcg every 30 (thirty) days into the skin.   Yes [provider]  diltiazem (CARDIZEM CD) 120 MG 24 hr capsule Take 120 mg by mouth daily.   Yes [provider]  diphenhydramine-acetaminophen (TYLENOL PM) 25-500 MG TABS tablet Take 1 tablet by mouth at bedtime.    Yes [provider]  febuxostat (ULORIC) 40 MG tablet Take 40 mg by mouth daily. Pt will check with her provider on when to start this medication   Yes [provider]  furosemide (LASIX) 20 MG tablet Take 80 mg by mouth 2 (two) times daily.    Yes [provider]  hydrALAZINE (APRESOLINE) 50 MG tablet Take 50 mg by mouth 3 (three) times daily.    Yes [provider]  KLOR-CON M20 20 MEQ tablet TAKE 1 TABLET BY MOUTH EVERY DAY Patient taking differently: Take 20 mEq by mouth daily.  02/17/19  Yes Jerline Pain, MD  latanoprost (XALATAN) 0.005 % ophthalmic solution Place 1 drop into both eyes at bedtime. 01/19/19  Yes [provider]  leflunomide (ARAVA) 10 MG tablet Take 10 mg by mouth daily.    Yes [provider]  Magnesium Oxide 400 (240 Mg) MG TABS Take 400 mg by mouth 2 (two) times daily.  05/27/18  Yes [provider]  pantoprazole (PROTONIX) 40 MG tablet Take 1 tablet (40 mg total) by mouth daily. 10/29/17  Yes Vann, Jessica U, DO  predniSONE (DELTASONE) 5 MG tablet Take 5 mg by mouth daily.  08/22/17  Yes [provider]  [place] into the lungs at bedtime. CPAP   Yes [provider]  Probiotic Product (ALIGN PO) Take 1 tablet by mouth at bedtime.    Yes [provider]  vitamin B-12 (CYANOCOBALAMIN) 1000 MCG tablet Take 1,000 mcg by mouth daily.   Yes [provider]  warfarin (COUMADIN) 5 MG tablet Take as directed by [facility] Patient taking differently: Take 2.5 mg by mouth daily. Take as directed by [facility] 03/04/19  Yes Jerline Pain, MD     Interim history/subjective:  Uneventful overnight Hemodynamics  have stabilized Some pain around site of surgery  Objective   Blood pressure 115/76, pulse (!) 130, temperature (!) 97.5 F (36.4 C), temperature source Oral, resp. rate 16, height 5\' 2"  (1.575 m), weight 103.8 kg, SpO2 90 %.        Intake/Output Summary (Last 24 hours) at 03/25/2019 0853 Last data filed at 03/28/2019 2300 Gross per 24 hour  Intake 1948 ml  Output 25 ml  Net 1923 ml   Filed Weights   03/20/2019 1951 03/23/2019 0836  Weight: 103.9 kg 103.8 kg    Examination: Physical Exam Vitals signs reviewed.  Constitutional:      General: She is not in acute distress.    Appearance: She is obese. She is not toxic-appearing.  HENT:     Head: Normocephalic and atraumatic.     Mouth/Throat:     Mouth: Mucous membranes are moist.  Eyes:     Extraocular Movements: Extraocular movements intact.     Conjunctiva/sclera: Conjunctivae normal.     Pupils: Pupils are equal, round, and reactive to light.  Neck:     Musculoskeletal: Normal range of motion and neck supple.  Cardiovascular:     Rate and Rhythm: Normal rate. Rhythm irregular.     Heart sounds: Normal heart sounds.    Pulmonary:     Effort: Pulmonary effort is normal.     Breath sounds: Normal breath sounds.  Abdominal:     General: Abdomen is flat.     Palpations: Abdomen is soft.  Musculoskeletal:     Right lower leg: No edema.     Left lower leg: No edema.     Comments: Right upper arm fistula with thrill.  Neurological:     General: No focal deficit present.     Mental Status: She is alert and oriented to person, place, and time.      Ancillary tests (personally reviewed)  CBC: Recent Labs  Lab 03/25/2019 2120 03/23/2019 0543  WBC 16.2* 14.1*  NEUTROABS 13.2*  --   HGB 11.9* 11.0*  HCT 42.3 37.4  MCV 94.4 93.0  PLT 148* 132*    Basic Metabolic Panel: Recent Labs  Lab 03/01/2019 2157 03/16/2019 0543  NA 142 142  K 5.2* 4.5  CL 103 107  CO2 26 26  GLUCOSE 177* 120*  BUN 72* 75*  CREATININE 3.65* 4.19*  CALCIUM 8.4* 7.9*   GFR: Estimated Creatinine Clearance: 13.7 mL/min (A) (by C-G formula based on SCr of 4.19 mg/dL (H)). Recent Labs  Lab 03/22/2019 2120 03/18/2019 2318 03/23/2019 0543  WBC 16.2*  --  14.1*  LATICACIDVEN  --  1.5  --     Liver Function Tests: Recent Labs  Lab 03/17/2019 2157 03/02/2019 0543  AST 57* 60*  ALT 62* 59*  ALKPHOS 71 70  BILITOT 1.8* 1.3*  PROT 5.2* 5.1*  ALBUMIN 2.7* 2.6*   No results for input(s): LIPASE, AMYLASE in the last 168 hours. No results for input(s): AMMONIA in the last 168 hours.  ABG No results found for: PHART, PCO2ART, PO2ART, HCO3, TCO2, ACIDBASEDEF, O2SAT   Coagulation Profile: Recent Labs  Lab 03/21/2019 2157 03/23/2019 0543 03/25/19 0300  INR 2.2* 2.2* 2.6*    Cardiac Enzymes: Recent Labs  Lab 03/22/2019 2204  TROPONINI 0.09*    HbA1C: Hgb A1c MFr Bld  Date/Time Value Ref Range Status  05/16/2018 05:47 PM 5.9 (H) 4.8 - 5.6 % Final    Comment:    (NOTE) Pre diabetes:  5.7%-6.4% Diabetes:              >6.4% Glycemic control for   <7.0% adults with diabetes   07/06/2011 02:16 AM 6.9 (H) <5.7 % Final     Comment:    (NOTE)                                                                       According to the ADA Clinical Practice Recommendations for 2011, when HbA1c is used as a screening test:  >=6.5%   Diagnostic of Diabetes Mellitus           (if abnormal result is confirmed) 5.7-6.4%   Increased risk of developing Diabetes Mellitus References:Diagnosis and Classification of Diabetes Mellitus,Diabetes VQQV,9563,87(FIEPP 1):S62-S69 and Standards of Medical Care in         Diabetes - 2011,Diabetes IRJJ,8841,66 (Suppl 1):S11-S61.    CBG: Recent Labs  Lab 03/12/2019 1117 03/18/2019 1232 03/19/2019 1612 03/14/2019 2139 03/25/19 0745  GLUCAP 91 93 165* 180* 162*    Assessment & Plan:  Postoperative hypotension secondary to distributive shock following evacuation of abscess and hypovolemia Recovering well -Off pressors   Stage IV kidney disease -Monitor renal parameters  Congestive heart failure Appears compensated at present  Atrial fibrillation  History of rheumatoid arthritis   Post evacuation of perirectal abscess   Reinitiate anticoagulation when able Reinitiation of other chronic meds Has been hemodynamically stable off pressors for at least 18 hours  Transfer out of the unit Transfer to hospitalist service  Best practice:  Diet: Resume renal diet Pain/Anxiety/Delirium protocol (if indicated): Fentanyl as needed VAP protocol (if indicated): Not applicable DVT prophylaxis: SCD resume warfarin postop once hemostasis achieved GI prophylaxis: Not indicated Urinary catheter: No catheter in place Glucose control: Phase 1 glycemic control Mobility: Progressive ambulation Code Status: Full code Family Communication: Family updated by Dr. Dema Severin Disposition: Transfer out of the unit to hospitalist service      Sherrilyn Rist, MD.  03/25/2019, 8:53 AM

## 2019-03-26 LAB — GLUCOSE, CAPILLARY
GLUCOSE-CAPILLARY: 143 mg/dL — AB (ref 70–99)
GLUCOSE-CAPILLARY: 124 mg/dL — AB (ref 70–99)
Glucose-Capillary: 115 mg/dL — ABNORMAL HIGH (ref 70–99)
Glucose-Capillary: 136 mg/dL — ABNORMAL HIGH (ref 70–99)

## 2019-03-26 LAB — PROTIME-INR
INR: 3.6 — ABNORMAL HIGH (ref 0.8–1.2)
Prothrombin Time: 35.4 seconds — ABNORMAL HIGH (ref 11.4–15.2)

## 2019-03-26 MED ORDER — DILTIAZEM HCL ER COATED BEADS 120 MG PO CP24
120.0000 mg | ORAL_CAPSULE | Freq: Every day | ORAL | Status: DC
  Administered 2019-03-26: 120 mg via ORAL
  Filled 2019-03-26 (×2): qty 1

## 2019-03-26 MED ORDER — FUROSEMIDE 40 MG PO TABS
40.0000 mg | ORAL_TABLET | Freq: Two times a day (BID) | ORAL | Status: DC
  Administered 2019-03-26: 40 mg via ORAL
  Filled 2019-03-26: qty 1

## 2019-03-26 MED ORDER — CARVEDILOL 3.125 MG PO TABS
3.1250 mg | ORAL_TABLET | Freq: Two times a day (BID) | ORAL | Status: DC
  Administered 2019-03-26 – 2019-03-28 (×5): 3.125 mg via ORAL
  Filled 2019-03-26 (×5): qty 1

## 2019-03-26 NOTE — Progress Notes (Signed)
2 Days Post-Op   Subjective/Chief Complaint: Comfortable Remains hemodynamically stable No bleeding from wounds   Objective: Vital signs in last 24 hours: Temp:  [96.7 F (35.9 C)-97.6 F (36.4 C)] 97.3 F (36.3 C) (03/28 0338) Pulse Rate:  [36-130] 106 (03/28 0600) Resp:  [7-31] 16 (03/28 0600) BP: (63-134)/(48-101) 125/99 (03/28 0600) SpO2:  [68 %-100 %] 100 % (03/28 0600) Arterial Line BP: (75-128)/(51-90) 92/60 (03/27 1330) Last BM Date: 03/22/2019  Intake/Output from previous day: 03/27 0701 - 03/28 0700 In: 1849.7 [I.V.:1281; IV Piggyback:568.7] Out: -  Intake/Output this shift: No intake/output data recorded.  Exam: Awake and alert On bedside toilet No bleeding  Lab Results:  Recent Labs    03/10/2019 2120 03/13/2019 0543  WBC 16.2* 14.1*  HGB 11.9* 11.0*  HCT 42.3 37.4  PLT 148* 132*   BMET Recent Labs    03/18/2019 2157 03/15/2019 0543  NA 142 142  K 5.2* 4.5  CL 103 107  CO2 26 26  GLUCOSE 177* 120*  BUN 72* 75*  CREATININE 3.65* 4.19*  CALCIUM 8.4* 7.9*   PT/INR Recent Labs    03/25/19 0300 03/26/19 0455  LABPROT 27.2* 35.4*  INR 2.6* 3.6*   ABG No results for input(s): PHART, HCO3 in the last 72 hours.  Invalid input(s): PCO2, PO2  Studies/Results: Dg Chest Port 1 View  Result Date: 03/28/2019 CLINICAL DATA:  Shortness of breath. EXAM: PORTABLE CHEST 1 VIEW COMPARISON:  01/29/2019 FINDINGS: The cardiac silhouette remains enlarged. Small volume loculated fluid in the right minor fissure is stable to slightly increased. There is minimal atelectasis in the left lung base. The interstitial markings are slightly prominent without overt edema. No pneumothorax is identified. No acute osseous abnormality is seen. IMPRESSION: Cardiomegaly with persistent small volume pleural fluid in the right minor fissure and minimal left basilar atelectasis. Electronically Signed   By: Logan Bores M.D.   On: 03/02/2019 13:54    Anti-infectives: Anti-infectives  (From admission, onward)   Start     Dose/Rate Route Frequency Ordered Stop   03/18/2019 0115  piperacillin-tazobactam (ZOSYN) IVPB 2.25 g  Status:  Discontinued     2.25 g 100 mL/hr over 30 Minutes Intravenous Every 6 hours 03/21/2019 0103 03/25/19 1023   03/15/2019 0030  vancomycin (VANCOCIN) 1,500 mg in sodium chloride 0.9 % 500 mL IVPB  Status:  Discontinued     1,500 mg 250 mL/hr over 120 Minutes Intravenous  Once 02/27/2019 0016 03/18/2019 0054   03/10/2019 0015  vancomycin (VANCOCIN) IVPB 1000 mg/200 mL premix  Status:  Discontinued     1,000 mg 200 mL/hr over 60 Minutes Intravenous  Once 03/27/2019 0013 03/06/2019 0016   03/13/2019 0015  cefTRIAXone (ROCEPHIN) 2 g in sodium chloride 0.9 % 100 mL IVPB  Status:  Discontinued     2 g 200 mL/hr over 30 Minutes Intravenous  Once 03/08/2019 0013 03/28/2019 0054      Assessment/Plan: s/p Procedure(s): IRRIGATION AND DEBRIDEMENT PERIANAL ABSCESS (N/A) Condyloma Removal (N/A) Rectal Exam Under Anesthesia (N/A)  Ok for discharge from surgical standpoint. Follow-up at [facility] arranged Instructions in the chart   LOS: 2 days    Coralie Keens 03/26/2019

## 2019-03-26 NOTE — Progress Notes (Signed)
Received to 6n21 from 59M at this time

## 2019-03-26 NOTE — Discharge Instructions (Signed)
How to Take a [employer] A sitz bath is a warm water bath that may be used to care for your rectum, genital area, or the area between your rectum and genitals (perineum). For a sitz bath, the water only comes up to your hips and covers your buttocks. A sitz bath may done at home in a bathtub or with a portable sitz bath that fits over the toilet. Your health care provider may recommend a sitz bath to help:  Relieve pain and discomfort after delivering a baby.  Relieve pain and itching from hemorrhoids or anal fissures.  Relieve pain after certain surgeries.  Relax muscles that are sore or tight. How to take a sitz bath Take 3-4 sitz baths a day, or as many as told by your health care provider. Bathtub sitz bath To take a sitz bath in a bathtub: 1. Partially fill a bathtub with warm water. The water should be deep enough to cover your hips and buttocks when you are sitting in the tub. 2. If your health care provider told you to put medicine in the water, follow his or her instructions. 3. Sit in the water. 4. Open the tub drain a little, and leave it open during your bath. 5. Turn on the warm water again, enough to replace the water that is draining out. Keep the water running throughout your bath. This helps keep the water at the right level and the right temperature. 6. Soak in the water for 15-20 minutes, or as long as told by your health care provider. 7. When you are done, be careful when you stand up. You may feel dizzy. 8. After the sitz bath, pat yourself dry. Do not rub your skin to dry it.  Over-the-toilet sitz bath To take a sitz bath with an over-the-toilet basin: 1. Follow the manufacturer's instructions. 2. Fill the basin with warm water. 3. If your health care provider told you to put medicine in the water, follow his or her instructions. 4. Sit on the seat. Make sure the water covers your buttocks and perineum. 5. Soak in the water for 15-20 minutes, or as long as told by  your health care provider. 6. After the sitz bath, pat yourself dry. Do not rub your skin to dry it. 7. Clean and dry the basin between uses. 8. Discard the basin if it cracks, or according to the manufacturer's instructions. Contact a health care provider if:  Your symptoms get worse. Do not continue with sitz baths if your symptoms get worse.  You have new symptoms. If this happens, do not continue with sitz baths until you talk with your health care provider. Summary  A sitz bath is a warm water bath in which the water only comes up to your hips and covers your buttocks.  A sitz bath may help relieve itching, relieve pain, and relax muscles that are sore or tight in the lower part of your body, including your genital area.  Take 3-4 sitz baths a day, or as many as told by your health care provider. Soak in the water for 15-20 minutes.  Do not continue with sitz baths if your symptoms get worse. This information is not intended to replace advice given to you by your health care provider. Make sure you discuss any questions you have with your health care provider. Document Released: 09/06/2004 Document Revised: 12/17/2017 Document Reviewed: 12/17/2017 Elsevier Interactive Patient Education  2019 [place], Adult Taking care of your wound properly  can help to prevent pain, infection, and scarring. It can also help your wound to heal more quickly. How to care for your wound Wound care      Follow instructions from your health care provider about how to take care of your wound. Make sure you: ? Wash your hands with soap and water before you change the bandage (dressing). If soap and water are not available, use hand sanitizer. ? Change your dressing as told by your health care provider. ? Leave stitches (sutures), skin glue, or adhesive strips in place. These skin closures may need to stay in place for 2 weeks or longer. If adhesive strip edges start to loosen and curl  up, you may trim the loose edges. Do not remove adhesive strips completely unless your health care provider tells you to do that.  Check your wound area every day for signs of infection. Check for: ? Redness, swelling, or pain. ? Fluid or blood. ? Warmth. ? Pus or a bad smell.  Ask your health care provider if you should clean the wound with mild soap and water. Doing this may include: ? Using a clean towel to pat the wound dry after cleaning it. Do not rub or scrub the wound. ? Applying a cream or ointment. Do this only as told by your health care provider. ? Covering the incision with a clean dressing.  Ask your health care provider when you can leave the wound uncovered.  Keep the dressing dry until your health care provider says it can be removed. Do not take baths, swim, use a hot tub, or do anything that would put the wound underwater until your health care provider approves. Ask your health care provider if you can take showers. You may only be allowed to take sponge baths. Medicines   If you were prescribed an antibiotic medicine, cream, or ointment, take or use the antibiotic as told by your health care provider. Do not stop taking or using the antibiotic even if your condition improves.  Take over-the-counter and prescription medicines only as told by your health care provider. If you were prescribed pain medicine, take it 30 or more minutes before you do any wound care or as told by your health care provider. General instructions  Return to your normal activities as told by your health care provider. Ask your health care provider what activities are safe.  Do not scratch or pick at the wound.  Do not use any products that contain nicotine or tobacco, such as cigarettes and e-cigarettes. These may delay wound healing. If you need help quitting, ask your health care provider.  Keep all follow-up visits as told by your health care provider. This is important.  Eat a diet that  includes protein, vitamin A, vitamin C, and other nutrient-rich foods to help the wound heal. ? Foods rich in protein include meat, dairy, beans, nuts, and other sources. ? Foods rich in vitamin A include carrots and dark green, leafy vegetables. ? Foods rich in vitamin C include citrus, tomatoes, and other fruits and vegetables. ? Nutrient-rich foods have protein, carbohydrates, fat, vitamins, or minerals. Eat a variety of healthy foods including vegetables, fruits, and whole grains. Contact a health care provider if:  You received a tetanus shot and you have swelling, severe pain, redness, or bleeding at the injection site.  Your pain is not controlled with medicine.  You have redness, swelling, or pain around the wound.  You have fluid or blood coming from the  wound.  Your wound feels warm to the touch.  You have pus or a bad smell coming from the wound.  You have a fever or chills.  You are nauseous or you vomit.  You are dizzy. Get help right away if:  You have a red streak going away from your wound.  The edges of the wound open up and separate.  Your wound is bleeding, and the bleeding does not stop with gentle pressure.  You have a rash.  You faint.  You have trouble breathing. Summary  Always wash your hands with soap and water before changing your bandage (dressing).  To help with healing, eat foods that are rich in protein, vitamin A, vitamin C, and other nutrients.  Check your wound every day for signs of infection. Contact your health care provider if you suspect that your wound is infected. This information is not intended to replace advice given to you by your health care provider. Make sure you discuss any questions you have with your health care provider. Document Released: 09/23/2008 Document Revised: 01/26/2018 Document Reviewed: 07/01/2016 Elsevier Interactive Patient Education  2019 [place] _______[facility]  RECTAL  SURGERY POST OP INSTRUCTIONS: POST OP INSTRUCTIONS  Always review your discharge instruction sheet given to you by the facility where your surgery was performed. IF YOU HAVE DISABILITY OR FAMILY LEAVE FORMS, YOU MUST BRING THEM TO THE OFFICE FOR PROCESSING.   DO NOT GIVE THEM TO YOUR DOCTOR.  1. A  prescription for pain medication may be given to you upon discharge.  Take your pain medication as prescribed, if needed.  If narcotic pain medicine is not needed, then you may take acetaminophen (Tylenol) or ibuprofen (Advil) as needed. 2. Take your usually prescribed medications unless otherwise directed. 3. If you need a refill on your pain medication, please contact your pharmacy.  They will contact our office to request authorization. Prescriptions will not be filled after 5 pm or on week-ends. 4. You should follow a light diet the first 48 hours after arrival home, such as soup and crackers, etc.  Be sure to include lots of fluids daily.  Resume your normal diet 2-3 days after surgery.. 5. Most patients will experience some swelling and discomfort in the rectal area. Ice packs, reclining and warm tub soaks will help.  Swelling and discomfort can take several days to resolve.  6. It is common to experience some constipation if taking pain medication after surgery.  Increasing fluid intake and taking a stool softener (such as Colace) will usually help or prevent this problem from occurring.  A mild laxative (Milk of Magnesia or Miralax) should be taken according to package directions if there are no bowel movements after 48 hours. 7. Unless discharge instructions indicate otherwise, leave your bandage dry and in place for 24 hours, or remove the bandage if you have a bowel movement. You may notice a small amount of bleeding with bowel movements for the first few days. You may have some packing in the rectum which will come out over the first day or two. You will need to wear an absorbent pad or soft cotton  gauze in your underwear until the drainage stops.it. 8. ACTIVITIES:  You may resume regular (light) daily activities beginning the next day--such as daily self-care, walking, climbing stairs--gradually increasing activities as tolerated.  You may have sexual intercourse when it is comfortable.  Refrain from any heavy lifting or straining until approved by your doctor. a.  You may drive when you are no longer taking prescription pain medication, you can comfortably wear a seatbelt, and you can safely maneuver your car and apply brakes. b. RETURN TO WORK: : ____________________ c.  9. You should see your doctor in the office for a follow-up appointment approximately 2-3 weeks after your surgery.  Make sure that you call for this appointment within a day or two after you arrive home to insure a convenient appointment time. 10. OTHER INSTRUCTIONS:  __________________________________________________________________________________________________________________________________________________________________________________________  WHEN TO CALL YOUR DOCTOR: 1. Fever over 101.0 2. Inability to urinate 3. Nausea and/or vomiting 4. Extreme swelling or bruising 5. Continued bleeding from rectum. 6. Increased pain, redness, or drainage from the incision 7. Constipation  The clinic staff is available to answer your questions during regular business hours.  Please dont hesitate to call and ask to speak to one of the nurses for clinical concerns.  If you have a medical emergency, go to the nearest emergency room or call 911.  A surgeon from [facility] is always on call at the hospital   [address] [place], [place], [place]  [address] ?  [address], [place], [place]   [address] [phone] ? [phone] ? FAX [phone] Web site: [email or web address]

## 2019-03-26 NOTE — Progress Notes (Addendum)
PROGRESS NOTE  Melissa Bartlett DOB: 05-17-1946 DOA: 03/28/2019 PCP: Donald Prose, MD  HPI/Recap of past 24 hours: Melissa Bartlett is a 73 y.o. female with medical history significant of CHF, CKD, diabetes, essential hypertension, morbid obesity, obstructive sleep apnea, paroxysmal atrial fibrillation, rheumatoid arthritis.  Patient reports symptoms of pain in the perineum that started the day before presentation.  She has associated nausea, and diarrhea.  She reports a fever with a T-max of 100.7 F.  She has taken Preparation H in addition to trying to use an onion per her daughters suggestion which did not help her symptoms.  Symptoms worsened. Vitals in the ED: Labs: WBC of 16.2K, troponin of 0.09, INR 2.3 Imaging: CTA pelvis significant for an abscess located in left perineum with possible communication to the anus.  General surgery consulted and followed.  Post I&D on 03/18/2019.  Postoperative hypotension following excision and drainage for which she was started on phenylephrine and transferred to [facility] service.  Transferred to [facility] service on 03/26/2019.  03/26/19: Patient seen and examined at her bedside.  She is wearing a CPAP.  No new complaints.  Denies any pain in her perineum.  No chest pain or dyspnea at rest.    Assessment/Plan: Principal Problem:   Perineal abscess Active Problems:   DM (diabetes mellitus), type 2 with renal complications (HCC)   Essential hypertension   Morbid obesity (HCC)   Paroxysmal atrial fibrillation (HCC)   CKD (chronic kidney disease) stage 4, GFR 15-29 ml/min (HCC)   OSA (obstructive sleep apnea)   Acute respiratory failure with hypoxia (HCC)   RA (rheumatoid arthritis) ([place])   Perianal abscess  Postoperative hypotension secondary to hypovolemia. Blood pressure improved Maintain map greater than 65 Hypotensive yesterday with carvedilol held Resume coreg at lower dose 3.125 mg BID and resume low dose po cardizem Continue to  closely monitor vital signs  Chronic A. Fib with occasional RvR On Coreg and po cardizem for rate control, resume at lower doses On Coumadin for primary CVA prevention INR supratherapeutic at 3.6  Perineal abscess status post I&D on 03/22/2019 DC abx 03/25/19 as recommended by general surgery Dressing changes as recommended by general surgery Repeat labs tomorrow  AKI on CKD 4 Baseline creatinine 3.6 with GFR of 14 Creatinine today 4.19 On lasix 80 mg BID Cut down lasix dose to 40 mg BID Monitor urine output Avoid nephrotoxic agents Repeat BMP in the morning  Moderate protein calorie malnutrition Albumin 2.6 Encourage increasing oral protein calorie intake  Chronic diastolic CHF LVEF greater than 65% on January 26, 2019 Continue strict I's and O's and daily weight  Supratherapeutic INR INR 3.6 Pharmacy managing Repeat INR daily     DVT prophylaxis:  Supratherapeutic INR on Coumadin Code Status: Full code Family Communication: None at bedside Disposition Plan:  Home possibly tomorrow or when INR is lower (3.6) and close to therapeutic level.  Consults called: General surgery by EDP   Admission status: Inpatient   Objective: Vitals:   03/26/19 0338 03/26/19 0400 03/26/19 0500 03/26/19 0600  BP:  (!) 94/58 111/64 (!) 125/99  Pulse:  97 97 (!) 106  Resp:  13 13 16   Temp: (!) 97.3 F (36.3 C)     TempSrc: Axillary     SpO2:  100% 93% 100%  Weight:      Height:        Intake/Output Summary (Last 24 hours) at 03/26/2019 0733 Last data filed at 03/26/2019 0600 Gross per 24  hour  Intake 1849.66 ml  Output -  Net 1849.66 ml   Filed Weights   03/07/2019 1951 03/08/2019 0836  Weight: 103.9 kg 103.8 kg    Exam:  . General: 73 y.o. year-old female well developed well nourished in no acute distress.  Alert and oriented x3. . Cardiovascular: Regular rate and rhythm with no rubs or gallops.  No thyromegaly or JVD noted.   Marland Kitchen Respiratory: Clear to auscultation  with no wheezes or rales. Good inspiratory effort. . Abdomen: Soft nontender nondistended with normal bowel sounds x4 quadrants. . Musculoskeletal: No lower extremity edema. 2/4 pulses in all 4 extremities. . Skin: Left perineal region appears clean with surgical dressing in place. Marland Kitchen Psychiatry: Mood is appropriate for condition and setting   Data Reviewed: CBC: Recent Labs  Lab 02/27/2019 2120 03/22/2019 0543  WBC 16.2* 14.1*  NEUTROABS 13.2*  --   HGB 11.9* 11.0*  HCT 42.3 37.4  MCV 94.4 93.0  PLT 148* 765*   Basic Metabolic Panel: Recent Labs  Lab 03/12/2019 2157 03/27/2019 0543  NA 142 142  K 5.2* 4.5  CL 103 107  CO2 26 26  GLUCOSE 177* 120*  BUN 72* 75*  CREATININE 3.65* 4.19*  CALCIUM 8.4* 7.9*   GFR: Estimated Creatinine Clearance: 13.7 mL/min (A) (by C-G formula based on SCr of 4.19 mg/dL (H)). Liver Function Tests: Recent Labs  Lab 03/11/2019 2157 03/21/2019 0543  AST 57* 60*  ALT 62* 59*  ALKPHOS 71 70  BILITOT 1.8* 1.3*  PROT 5.2* 5.1*  ALBUMIN 2.7* 2.6*   No results for input(s): LIPASE, AMYLASE in the last 168 hours. No results for input(s): AMMONIA in the last 168 hours. Coagulation Profile: Recent Labs  Lab 03/12/2019 2157 02/28/2019 0543 03/25/19 0300 03/26/19 0455  INR 2.2* 2.2* 2.6* 3.6*   Cardiac Enzymes: Recent Labs  Lab 03/26/2019 2204  TROPONINI 0.09*   BNP (last 3 results) No results for input(s): PROBNP in the last 8760 hours. HbA1C: No results for input(s): HGBA1C in the last 72 hours. CBG: Recent Labs  Lab 03/13/2019 2139 03/25/19 0745 03/25/19 1108 03/25/19 1554 03/25/19 2158  GLUCAP 180* 162* 140* 247* 153*   Lipid Profile: No results for input(s): CHOL, HDL, LDLCALC, TRIG, CHOLHDL, LDLDIRECT in the last 72 hours. Thyroid Function Tests: No results for input(s): TSH, T4TOTAL, FREET4, T3FREE, THYROIDAB in the last 72 hours. Anemia Panel: No results for input(s): VITAMINB12, FOLATE, FERRITIN, TIBC, IRON, RETICCTPCT in the last  72 hours. Urine analysis:    Component Value Date/Time   COLORURINE STRAW (A) 01/24/2019 1645   APPEARANCEUR CLEAR 01/24/2019 1645   LABSPEC 1.008 01/24/2019 1645   PHURINE 5.0 01/24/2019 1645   GLUCOSEU NEGATIVE 01/24/2019 1645   HGBUR NEGATIVE 01/24/2019 1645   BILIRUBINUR NEGATIVE 01/24/2019 1645   KETONESUR NEGATIVE 01/24/2019 1645   PROTEINUR 30 (A) 01/24/2019 1645   UROBILINOGEN 0.2 04/28/2010 0304   NITRITE NEGATIVE 01/24/2019 1645   LEUKOCYTESUR NEGATIVE 01/24/2019 1645   Sepsis Labs: @LABRCNTIP (procalcitonin:4,lacticidven:4)  ) Recent Results (from the past 240 hour(s))  Blood culture (routine x 2)     Status: None (Preliminary result)   Collection Time: 03/24/2019 11:15 PM  Result Value Ref Range Status   Specimen Description BLOOD LEFT HAND  Final   Special Requests   Final    BOTTLES DRAWN AEROBIC AND ANAEROBIC Blood Culture results may not be optimal due to an inadequate volume of blood received in culture bottles   Culture   Final  NO GROWTH 1 DAY Performed at [facility], [place] [address] [place], [place] [address]    Report Status PENDING  Incomplete  Blood culture (routine x 2)     Status: None (Preliminary result)   Collection Time: 03/11/2019 12:37 AM  Result Value Ref Range Status   Specimen Description BLOOD LEFT HAND  Final   Special Requests   Final    BOTTLES DRAWN AEROBIC AND ANAEROBIC Blood Culture adequate volume   Culture   Final    NO GROWTH 1 DAY Performed at [facility], [place] [address], [place], [place] [address]    Report Status PENDING  Incomplete  Surgical pcr screen     Status: None   Collection Time: 03/26/2019  2:28 AM  Result Value Ref Range Status   MRSA, PCR NEGATIVE NEGATIVE Final   Staphylococcus aureus NEGATIVE NEGATIVE Final    Comment: (NOTE) The Xpert SA Assay (FDA approved for NASAL specimens in patients 36 years of age and older), is one component of a comprehensive surveillance program. It is not intended to  diagnose infection nor to guide or monitor treatment. Performed at [facility], [place] [address] [place], [place] [address]   Aerobic/Anaerobic Culture (surgical/deep wound)     Status: None (Preliminary result)   Collection Time: 03/04/2019 10:05 AM  Result Value Ref Range Status   Specimen Description ABSCESS PERINEAL  Final   Special Requests NONE  Final   Gram Stain   Final    FEW WBC PRESENT,BOTH PMN AND MONONUCLEAR MODERATE GRAM POSITIVE COCCI IN PAIRS MODERATE GRAM VARIABLE ROD    Culture   Final    CULTURE REINCUBATED FOR BETTER GROWTH Performed at [facility], [place] [address], [place], [place] [address]    Report Status PENDING  Incomplete      Studies: No results found.  Scheduled Meds: . sodium chloride   Intravenous Once  . atorvastatin  20 mg Oral q1800  . brimonidine  1 drop Both Eyes BID   And  . timolol  1 drop Both Eyes BID  . calcitRIOL  0.25 mcg Oral Q M,W,F  . furosemide  80 mg Oral BID  . hydrALAZINE  50 mg Oral TID  . insulin aspart  0-9 Units Subcutaneous TID WC  . latanoprost  1 drop Both Eyes QHS  . ondansetron  4 mg Intravenous Once  . pantoprazole  40 mg Oral Daily  . predniSONE  5 mg Oral Daily  . vitamin B-12  1,000 mcg Oral Daily    Continuous Infusions: . sodium chloride    . sodium chloride 50 mL/hr at 03/26/19 0600     LOS: 2 days     Kayleen Memos, MD [facility] Pager [phone]  If 7PM-7AM, please contact night-coverage [email or web address] Password [facility] 03/26/2019, 7:33 AM

## 2019-03-27 LAB — CBC WITH DIFFERENTIAL/PLATELET
Abs Immature Granulocytes: 0.12 10*3/uL — ABNORMAL HIGH (ref 0.00–0.07)
Basophils Absolute: 0 10*3/uL (ref 0.0–0.1)
Basophils Relative: 0 %
Eosinophils Absolute: 0 10*3/uL (ref 0.0–0.5)
Eosinophils Relative: 0 %
HCT: 35.8 % — ABNORMAL LOW (ref 36.0–46.0)
Hemoglobin: 10.8 g/dL — ABNORMAL LOW (ref 12.0–15.0)
IMMATURE GRANULOCYTES: 1 %
Lymphocytes Relative: 8 %
Lymphs Abs: 0.9 10*3/uL (ref 0.7–4.0)
MCH: 27.4 pg (ref 26.0–34.0)
MCHC: 30.2 g/dL (ref 30.0–36.0)
MCV: 90.9 fL (ref 80.0–100.0)
Monocytes Absolute: 0.9 10*3/uL (ref 0.1–1.0)
Monocytes Relative: 8 %
NEUTROS PCT: 83 %
NRBC: 0.3 % — AB (ref 0.0–0.2)
Neutro Abs: 9.6 10*3/uL — ABNORMAL HIGH (ref 1.7–7.7)
Platelets: 92 10*3/uL — ABNORMAL LOW (ref 150–400)
RBC: 3.94 MIL/uL (ref 3.87–5.11)
RDW: 16 % — ABNORMAL HIGH (ref 11.5–15.5)
WBC: 11.6 10*3/uL — ABNORMAL HIGH (ref 4.0–10.5)

## 2019-03-27 LAB — PROTIME-INR
INR: 3.7 — ABNORMAL HIGH (ref 0.8–1.2)
Prothrombin Time: 36.3 seconds — ABNORMAL HIGH (ref 11.4–15.2)

## 2019-03-27 LAB — BASIC METABOLIC PANEL
Anion gap: 19 — ABNORMAL HIGH (ref 5–15)
BUN: 108 mg/dL — ABNORMAL HIGH (ref 8–23)
CO2: 15 mmol/L — ABNORMAL LOW (ref 22–32)
Calcium: 7.4 mg/dL — ABNORMAL LOW (ref 8.9–10.3)
Chloride: 102 mmol/L (ref 98–111)
Creatinine, Ser: 6.78 mg/dL — ABNORMAL HIGH (ref 0.44–1.00)
GFR calc Af Amer: 6 mL/min — ABNORMAL LOW (ref >60)
GFR calc non Af Amer: 6 mL/min — ABNORMAL LOW (ref >60)
Glucose, Bld: 103 mg/dL — ABNORMAL HIGH (ref 70–99)
Potassium: 5.5 mmol/L — ABNORMAL HIGH (ref 3.5–5.1)
Sodium: 136 mmol/L (ref 135–145)

## 2019-03-27 LAB — GLUCOSE, CAPILLARY
GLUCOSE-CAPILLARY: 87 mg/dL (ref 70–99)
Glucose-Capillary: 85 mg/dL (ref 70–99)
Glucose-Capillary: 150 mg/dL — ABNORMAL HIGH (ref 70–99)
Glucose-Capillary: 166 mg/dL — ABNORMAL HIGH (ref 70–99)

## 2019-03-27 MED ORDER — SIMETHICONE 80 MG PO CHEW: 80.0000 mg | CHEWABLE_TABLET | Freq: Four times a day (QID) | ORAL | Status: DC | PRN

## 2019-03-27 MED ORDER — ALUM & MAG HYDROXIDE-SIMETH 200-200-20 MG/5ML PO SUSP
30.0000 mL | Freq: Once | ORAL | Status: AC
  Administered 2019-03-27: 30 mL via ORAL
  Filled 2019-03-27: qty 30

## 2019-03-27 MED ORDER — LIDOCAINE VISCOUS HCL 2 % MT SOLN
15.0000 mL | Freq: Once | OROMUCOSAL | Status: AC
  Administered 2019-03-27: 15 mL via ORAL
  Filled 2019-03-27: qty 15

## 2019-03-27 MED ORDER — CALCIUM CARBONATE ANTACID 500 MG PO CHEW
1.0000 | CHEWABLE_TABLET | Freq: Three times a day (TID) | ORAL | Status: DC | PRN
  Administered 2019-03-27: 200 mg via ORAL
  Filled 2019-03-27: qty 1

## 2019-03-27 MED ORDER — SODIUM BICARBONATE 8.4 % IV SOLN
INTRAVENOUS | Status: DC
  Administered 2019-03-27 – 2019-03-29 (×4): via INTRAVENOUS
  Filled 2019-03-27 (×6): qty 150

## 2019-03-27 NOTE — Evaluation (Signed)
Physical Therapy Evaluation Patient Details Name: Melissa Bartlett MRN: [id number] DOB: 16-Jan-1946 Today's Date: 03/27/2019   History of Present Illness  Melissa Bartlett is a 73 y.o. female with medical history significant of CHF, CKD, diabetes, essential hypertension, morbid obesity, obstructive sleep apnea, paroxysmal atrial fibrillation, rheumatoid arthritis. 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  Clinical Impression   Patient is s/p above surgery resulting in functional limitations due to the deficits listed below (see PT Problem List). Managing in household independently prior to admission; presents with decr activity tolerance, decr functional mobility; Not a lot of difficulty with steps; but, noted she was slower to answer questions in standing and closing eyes, so we opted to quickly get to recliner; Perked up after about 1 minute sitting; will consider getting orthostatic vital signs next session; Patient will benefit from skilled PT to increase their independence and safety with mobility to allow discharge to the venue listed below.       Follow Up Recommendations Home health PT;Supervision/Assistance - 24 hour;Other (comment)(if slow progress, must consider SNF)    Equipment Recommendations  Rolling walker with 5" wheels;3in1 (PT)    Recommendations for Other Services OT consult(will order per protocol)     Precautions / Restrictions Precautions Precautions: Fall      Mobility  Bed Mobility Overal bed mobility: Needs Assistance Bed Mobility: Supine to Sit     Supine to sit: Mod assist     General bed mobility comments: Light mod handheld assist to pull to sit  Transfers Overall transfer level: Needs assistance Equipment used: Rolling walker (2 wheeled) Transfers: Sit to/from Stand Sit to Stand: Mod assist         General transfer comment: Light mod assist to  power up; cues for hand placement  Ambulation/Gait Ambulation/Gait assistance: Min guard Gait Distance (Feet): (pivotal steps bed to chair) Assistive device: Rolling walker (2 wheeled) Gait Pattern/deviations: Decreased step length - right;Decreased step length - left     General Gait Details: Not a lot of difficulty with steps; but, noted she was slower to answer questions in standing and closing eyes, so we opted to quickly get to recliner; Perked up after about 1 minute sitting  Stairs            Wheelchair Mobility    Modified Rankin (Stroke Patients Only)       Balance                                             Pertinent Vitals/Pain Pain Assessment: Faces Faces Pain Scale: Hurts little more Pain Location: perianal surgical site Pain Descriptors / Indicators: Operative site guarding;Grimacing Pain Intervention(s): Monitored during session;Repositioned;Other (comment)(Reports is comfortable in chair)    Home Living Family/patient expects to be discharged to:: Private residence Living Arrangements: Spouse/significant other;Children Available Help at Discharge: Family;Available PRN/intermittently Type of Home: House Home Access: Stairs to enter Entrance Stairs-Rails: Right Entrance Stairs-Number of Steps: 2-3 Home Layout: One level   Additional Comments: Need more information RE: if she has any DME at home already    Prior Function Level of Independence: Independent               Hand Dominance        Extremity/Trunk Assessment   Upper Extremity Assessment Upper Extremity  Assessment: Generalized weakness    Lower Extremity Assessment Lower Extremity Assessment: Generalized weakness(and hip ROM grossly limited by body habitus)       Communication   Communication: No difficulties  Cognition Arousal/Alertness: Awake/alert Behavior During Therapy: WFL for tasks assessed/performed Overall Cognitive Status: Within Functional  Limits for tasks assessed                                        General Comments General comments (skin integrity, edema, etc.): Will consider orthostatics next session    Exercises     Assessment/Plan    PT Assessment Patient needs continued PT services  PT Problem List Decreased strength;Decreased range of motion;Decreased activity tolerance;Decreased balance;Decreased mobility;Decreased coordination;Decreased knowledge of use of DME;Decreased safety awareness;Decreased knowledge of precautions;Pain       PT Treatment Interventions DME instruction;Gait training;Stair training;Functional mobility training;Therapeutic activities;Therapeutic exercise;Balance training;Patient/family education    PT Goals (Current goals can be found in the Care Plan section)  Acute Rehab PT Goals Patient Stated Goal: she really hopes to be able to get home PT Goal Formulation: With patient Time For Goal Achievement: 04/10/19 Potential to Achieve Goals: Good    Frequency Min 3X/week   Barriers to discharge   Must be able to tolerate more activity in order to dc home    Co-evaluation               AM-PAC PT "6 Clicks" Mobility  Outcome Measure Help needed turning from your back to your side while in a flat bed without using bedrails?: None Help needed moving from lying on your back to sitting on the side of a flat bed without using bedrails?: A Little Help needed moving to and from a bed to a chair (including a wheelchair)?: A Little Help needed standing up from a chair using your arms (e.g., wheelchair or bedside chair)?: A Little Help needed to walk in hospital room?: A Lot Help needed climbing 3-5 steps with a railing? : A Lot 6 Click Score: 17    End of Session Equipment Utilized During Treatment: Gait belt Activity Tolerance: Patient limited by fatigue Patient left: in chair;with call bell/phone within reach Nurse Communication: Mobility status PT Visit  Diagnosis: Unsteadiness on feet (R26.81);Other abnormalities of gait and mobility (R26.89);Pain Pain - part of body: (perianal)    Time: 7253-6644 PT Time Calculation (min) (ACUTE ONLY): 18 min   Charges:   PT Evaluation $PT Eval Moderate Complexity: 1 Mod          Roney [place], [place]  Acute Rehabilitation Services Pager [phone] Office [phone]   Colletta [place] 03/27/2019, 12:26 PM

## 2019-03-27 NOTE — Progress Notes (Addendum)
PROGRESS NOTE  Melissa Bartlett PJK:[id number] DOB: 1946-12-22 DOA: 03/12/2019 PCP: Donald Prose, MD  HPI/Recap of past 24 hours: Melissa Bartlett is a 73 y.o. female with medical history significant of CHF, CKD, diabetes, essential hypertension, morbid obesity, obstructive sleep apnea, paroxysmal atrial fibrillation, rheumatoid arthritis.  Patient reports symptoms of pain in the perineum that started the day before presentation.  She has associated nausea, and diarrhea.  She reports a fever with a T-max of 100.7 F.  She has taken Preparation H in addition to trying to use an onion per her daughters suggestion which did not help her symptoms.  Symptoms worsened. Vitals in the ED: Labs: WBC of 16.2K, troponin of 0.09, INR 2.3 Imaging: CTA pelvis significant for an abscess located in left perineum with possible communication to the anus.  General surgery consulted and followed.  Post I&D on 03/14/2019.  Postoperative hypotension following excision and drainage for which she was started on phenylephrine and transferred to [facility] service.  Transferred to [facility] service on 03/26/2019.  03/26/19: Patient seen and examined at her bedside.  She is wearing a CPAP.  No new complaints.  Denies any pain in her perineum.  No chest pain or dyspnea at rest.  03/27/19: Patient seen and examined at her bedside.  No acute events overnight.  She states she feels better.  Perineal wound improving, gen surgery signed off yesterday and will follow-up outpatient.  Worsening renal function this morning with oliguria.  Nephrology consulted.  Follows with Dr. Marval Regal outpatient.    Assessment/Plan: Principal Problem:   Perineal abscess Active Problems:   DM (diabetes mellitus), type 2 with renal complications (HCC)   Essential hypertension   Morbid obesity (HCC)   Paroxysmal atrial fibrillation (HCC)   CKD (chronic kidney disease) stage 4, GFR 15-29 ml/min (HCC)   OSA (obstructive sleep apnea)   Acute respiratory  failure with hypoxia (HCC)   RA (rheumatoid arthritis) ([facility])   Perianal abscess  Postoperative hypotension secondary to hypovolemia. Persistent hypotension, held off Lasix, p.o. Cardizem Continue Coreg at the smallest dose 3.125 mg twice daily Maintain map greater than 65 Continue to closely monitor vital signs  Worsening AKI on CKD 4 with oliguria suspect secondary to hypovolemia versus hypotension versus proteinuria History of proteinuria noted on prior urine analysis Continue to avoid nephrotoxic agents/hypotension  Baseline creatinine 3.6 with GFR of 14 Creatinine today 6.78 from 4.19 yesterday Hold off Lasix 80 mg BID and defer to nephrology to restart Continue to closely monitor urine output Change diet to renal carb diet with 1200 cc fluid restriction  Hyperkalemia Potassium 5.5 Suspect secondary to worsening renal function Repeat BMP in the morning  Chronic A. Fib with occasional RvR Rate is Currently controlled on Coreg Supratherapeutic INR on Coumadin for primary CVA prevention  INR supratherapeutic at 3.7  Perineal abscess status post I&D on 02/28/2019 DC abx 03/25/19 as recommended by general surgery Dressing changes as recommended by general surgery Afebrile WBC trending down from 14 K to 11 K With follow-up with general surgery outpatient  Moderate protein calorie malnutrition Albumin 2.6 Encourage increasing oral protein calorie intake  Chronic diastolic CHF LVEF greater than 65% on January 26, 2019 Continue strict I's and O's and daily weight  Supratherapeutic INR INR 3.7 Hold Coumadin and repeat INR tomorrow 03/28/2019 Pharmacy managing  Physical debility Physical therapy to assess Fall precautions  GERD Continue Protonix GI cocktail as needed  Hyperlipidemia Continue statin    DVT prophylaxis:  Supratherapeutic INR on Coumadin  Code Status: Full code Family Communication: None at bedside Disposition Plan:  Home when nephrology signs  off and INR close to therapeutic level, currently supratherapeutic INR 3.7.    Consults called: General surgery by EDP, signed off on 03/26/2019, nephrology   Admission status: Inpatient   Objective: Vitals:   03/26/19 1437 03/26/19 1440 03/26/19 2112 03/27/19 0519  BP: (!) 94/56  90/68 (!) 99/49  Pulse: 91  90 78  Resp: 10 16 16 16   Temp: 98.4 F (36.9 C)  97.7 F (36.5 C) (!) 97.3 F (36.3 C)  TempSrc: Axillary  Oral Oral  SpO2: 93%  99% 98%  Weight:      Height:        Intake/Output Summary (Last 24 hours) at 03/27/2019 0723 Last data filed at 03/27/2019 0415 Gross per 24 hour  Intake 150.07 ml  Output 200 ml  Net -49.93 ml   Filed Weights   03/09/2019 1951 03/25/2019 0836  Weight: 103.9 kg 103.8 kg    Exam:  . General: 73 y.o. year-old female Well developed well-nourished in no acute distress.  Alert and oriented x3.   . Cardiovascular: Irregular rate and rhythm with no rubs or gallops.  No JVD or thyromegaly noted. Marland Kitchen Respiratory: Clear to auscultation with no wheezes or rales.  Good inspiratory effort. . Abdomen: Soft nontender nondistended with normal bowel sounds x4 quadrants. . Musculoskeletal: No lower extremity edema. 2/4 pulses in all 4 extremities. . Skin: Left perineal region appears clean with surgical dressing in place. Marland Kitchen Psychiatry: Mood is appropriate for condition and setting   Data Reviewed: CBC: Recent Labs  Lab 03/14/2019 2120 03/23/2019 0543 03/27/19 0229  WBC 16.2* 14.1* 11.6*  NEUTROABS 13.2*  --  9.6*  HGB 11.9* 11.0* 10.8*  HCT 42.3 37.4 35.8*  MCV 94.4 93.0 90.9  PLT 148* 132* 92*   Basic Metabolic Panel: Recent Labs  Lab 03/20/2019 2157 03/16/2019 0543 03/27/19 0229  NA 142 142 136  K 5.2* 4.5 5.5*  CL 103 107 102  CO2 26 26 15*  GLUCOSE 177* 120* 103*  BUN 72* 75* 108*  CREATININE 3.65* 4.19* 6.78*  CALCIUM 8.4* 7.9* 7.4*   GFR: Estimated Creatinine Clearance: 8.5 mL/min (A) (by C-G formula based on SCr of 6.78 mg/dL (H)).  Liver Function Tests: Recent Labs  Lab 03/14/2019 2157 03/26/2019 0543  AST 57* 60*  ALT 62* 59*  ALKPHOS 71 70  BILITOT 1.8* 1.3*  PROT 5.2* 5.1*  ALBUMIN 2.7* 2.6*   No results for input(s): LIPASE, AMYLASE in the last 168 hours. No results for input(s): AMMONIA in the last 168 hours. Coagulation Profile: Recent Labs  Lab 03/13/2019 2157 03/28/2019 0543 03/25/19 0300 03/26/19 0455 03/27/19 0229  INR 2.2* 2.2* 2.6* 3.6* 3.7*   Cardiac Enzymes: Recent Labs  Lab 03/27/2019 2204  TROPONINI 0.09*   BNP (last 3 results) No results for input(s): PROBNP in the last 8760 hours. HbA1C: No results for input(s): HGBA1C in the last 72 hours. CBG: Recent Labs  Lab 03/25/19 2158 03/26/19 0806 03/26/19 1158 03/26/19 1644 03/26/19 2107  GLUCAP 153* 115* 143* 124* 136*   Lipid Profile: No results for input(s): CHOL, HDL, LDLCALC, TRIG, CHOLHDL, LDLDIRECT in the last 72 hours. Thyroid Function Tests: No results for input(s): TSH, T4TOTAL, FREET4, T3FREE, THYROIDAB in the last 72 hours. Anemia Panel: No results for input(s): VITAMINB12, FOLATE, FERRITIN, TIBC, IRON, RETICCTPCT in the last 72 hours. Urine analysis:    Component Value Date/Time   COLORURINE STRAW (  A) 01/24/2019 [place] 01/24/2019 1645   LABSPEC 1.008 01/24/2019 1645   PHURINE 5.0 01/24/2019 [place] 01/24/2019 [place] 01/24/2019 [place] 01/24/2019 [place] 01/24/2019 1645   PROTEINUR 30 (A) 01/24/2019 1645   UROBILINOGEN 0.2 04/28/2010 0304   NITRITE NEGATIVE 01/24/2019 1645   LEUKOCYTESUR NEGATIVE 01/24/2019 1645   Sepsis Labs: @LABRCNTIP (procalcitonin:4,lacticidven:4)  ) Recent Results (from the past 240 hour(s))  Blood culture (routine x 2)     Status: None (Preliminary result)   Collection Time: 03/16/2019 11:15 PM  Result Value Ref Range Status   Specimen Description BLOOD LEFT HAND  Final   Special Requests   Final     BOTTLES DRAWN AEROBIC AND ANAEROBIC Blood Culture results may not be optimal due to an inadequate volume of blood received in culture bottles   Culture   Final    NO GROWTH 2 DAYS Performed at [facility], [place] [address], [place], [place] [address]    Report Status PENDING  Incomplete  Blood culture (routine x 2)     Status: None (Preliminary result)   Collection Time: 03/26/2019 12:37 AM  Result Value Ref Range Status   Specimen Description BLOOD LEFT HAND  Final   Special Requests   Final    BOTTLES DRAWN AEROBIC AND ANAEROBIC Blood Culture adequate volume   Culture   Final    NO GROWTH 2 DAYS Performed at [facility], [place] [address] [place], [place] [address]    Report Status PENDING  Incomplete  Surgical pcr screen     Status: None   Collection Time: 02/28/2019  2:28 AM  Result Value Ref Range Status   MRSA, PCR NEGATIVE NEGATIVE Final   Staphylococcus aureus NEGATIVE NEGATIVE Final    Comment: (NOTE) The Xpert SA Assay (FDA approved for NASAL specimens in patients 53 years of age and older), is one component of a comprehensive surveillance program. It is not intended to diagnose infection nor to guide or monitor treatment. Performed at [facility], [place] [address] [place], [place] [address]   Aerobic/Anaerobic Culture (surgical/deep wound)     Status: None (Preliminary result)   Collection Time: 03/01/2019 10:05 AM  Result Value Ref Range Status   Specimen Description ABSCESS PERINEAL  Final   Special Requests NONE  Final   Gram Stain   Final    FEW WBC PRESENT,BOTH PMN AND MONONUCLEAR MODERATE GRAM POSITIVE COCCI IN PAIRS MODERATE GRAM VARIABLE ROD    Culture   Final    CULTURE REINCUBATED FOR BETTER GROWTH HOLDING FOR POSSIBLE ANAEROBE Performed at [facility], [place] [address], [place], [place] [address]    Report Status PENDING  Incomplete      Studies: No results found.  Scheduled Meds: . sodium chloride   Intravenous Once  .  atorvastatin  20 mg Oral q1800  . brimonidine  1 drop Both Eyes BID   And  . timolol  1 drop Both Eyes BID  . calcitRIOL  0.25 mcg Oral Q M,W,F  . carvedilol  3.125 mg Oral BID WC  . insulin aspart  0-9 Units Subcutaneous TID WC  . latanoprost  1 drop Both Eyes QHS  . ondansetron  4 mg Intravenous Once  . pantoprazole  40 mg Oral Daily  . predniSONE  5 mg Oral Daily  . vitamin B-12  1,000 mcg Oral Daily  Continuous Infusions: . sodium chloride       LOS: 3 days     Kayleen Memos, MD [facility] Pager [phone]  If 7PM-7AM, please contact night-coverage [email or web address] Password [email or web address] 03/27/2019, 7:23 AM

## 2019-03-27 NOTE — Consult Note (Signed)
[facility]    NEPHROLOGY CONSULTATION NOTE  PATIENT ID:  Melissa Bartlett, DOB:  01/02/1946  HPI: The patient is a 73 y.o. year old female patient with a past medical history significant for congestive heart failure with preserved ejection fraction, chronic kidney disease stage IV with a baseline serum creatinine around 3.5-4.0, diabetes, hypertension, morbid obesity, obstructive sleep apnea, paroxysmal atrial fibrillation, and rheumatoid arthritis who presented with chief complaint of pain in the perineum that started the day before presentation.  She was noted to have an associated fever and elevated white count.  A CT of the pelvis revealed an abscess in the left perineum.  General surgery was consulted and she underwent and a I&D on 03/28/2019.  She was notably hypotensive postoperatively.  She has had decreased urine output, and her creatinine has increased to 6.  She does follow with Dr. Marval Regal in the outpatient setting.  She denies any difficulty passing her urine.  She currently has no acute complaints other than some decreased appetite.   Past Medical History:  Diagnosis Date  . Anemia   . CHF (congestive heart failure) ([place])   . CKD (chronic kidney disease), stage IV ([place])   . Complication of anesthesia    " I aspirated during my gallbladder surgery"  . Diabetes ([place])   . GERD (gastroesophageal reflux disease)   . Heart murmur   . HTN (hypertension)   . Morbid obesity with BMI of 45.0-49.9, adult ([place])   . OSA (obstructive sleep apnea)    with AHI 11/hr with nocturnal hypoxemia , uses cpap  . PAF (paroxysmal atrial fibrillation) ([place])    s/p DCCV in 8/17  . Pneumonia   . RA (rheumatoid arthritis) ([place])   . Wears glasses   . Wears partial dentures     Past Surgical History:  Procedure Laterality Date  . ABDOMINAL HYSTERECTOMY    . ANKLE FUSION Right   . AV FISTULA PLACEMENT Right 07/14/2018   Procedure: ARTERIOVENOUS (AV) FISTULA CREATION  BRACHIOCEPHALIC;  Surgeon: Elam Dutch, MD;  Location: [place];  Service: Vascular;  Laterality: Right;  . BREAST BIOPSY    . CARDIOVERSION N/A 08/15/2016   Procedure: CARDIOVERSION;  Surgeon: Jerline Pain, MD;  Location: [facility];  Service: Cardiovascular;  Laterality: N/A;  . CHOLECYSTECTOMY    . COLONOSCOPY W/ BIOPSIES AND POLYPECTOMY    . CONDYLOMA EXCISION/FULGURATION N/A 03/27/2019   Procedure: Condyloma Removal;  Surgeon: Ileana Roup, MD;  Location: [place];  Service: General;  Laterality: N/A;  . DILATION AND CURETTAGE OF UTERUS    . FISTULA SUPERFICIALIZATION Right 01/03/2019   Procedure: SUPERFICIALIZATION OF RIGHT ARM FISTULA;  Surgeon: Elam Dutch, MD;  Location: [place];  Service: Vascular;  Laterality: Right;  . GALLBLADDER SURGERY    . HYSTEROTOMY     Patrtial  . HYSTEROTOMY     Complete  . INCISION AND DRAINAGE PERIRECTAL ABSCESS N/A 03/14/2019   Procedure: IRRIGATION AND DEBRIDEMENT PERIANAL ABSCESS;  Surgeon: Ileana Roup, MD;  Location: [place];  Service: General;  Laterality: N/A;  . KNEE ARTHROSCOPY    . LEFT HEART CATH AND CORONARY ANGIOGRAPHY N/A 11/16/2017   Procedure: LEFT HEART CATH AND CORONARY ANGIOGRAPHY;  Surgeon: Martinique, Peter M, MD;  Location: [place] [facility];  Service: Cardiovascular;  Laterality: N/A;  . MULTIPLE TOOTH EXTRACTIONS    . RECTAL EXAM UNDER ANESTHESIA N/A 02/28/2019   Procedure: Rectal Exam Under Anesthesia;  Surgeon: Ileana Roup, MD;  Location: [facility];  Service: General;  Laterality: N/A;  . TUBAL LIGATION      Family History  Problem Relation Age of Onset  . Hypertension Mother        Family HX Diabetes  . Stomach cancer Father     Social History   Tobacco Use  . Smoking status: Former Smoker    Types: Cigarettes    Last attempt to quit: 04/08/2016    Years since quitting: 2.9  . Smokeless tobacco: Never Used  Substance Use Topics  . Alcohol use: No    Alcohol/week: 0.0 standard drinks  . Drug  use: No    REVIEW OF SYSTEMS: General:  no fatigue, no weakness, positive decreased appetite Head:  no headaches Eyes:  no blurred vision ENT:  no sore throat Neck:  no masses CV:  no chest pain, no orthopnea Lungs:  no shortness of breath, no cough GI:  no nausea or vomiting, no diarrhea GU:  no dysuria or hematuria, positive decreased urine output. Skin:  no rashes or lesions Neuro:  no focal numbness or weakness Psych:  no depression or anxiety    PHYSICAL EXAM:  Vitals:   03/27/19 0519 03/27/19 0815  BP: (!) 99/49 111/66  Pulse: 78   Resp: 16   Temp: (!) 97.3 F (36.3 C)   SpO2: 98%    I/O last 3 completed shifts: In: 850.1 [P.O.:100; I.V.:750.1] Out: 200 [Urine:200]   General:  AAOx3 NAD obese HEENT: MMM [place] AT anicteric sclera Neck:  No JVD, no adenopathy CV:  Heart RRR  Lungs:  L/S CTA bilaterally Abd:  abd SNT/ND with normal BS GU:  Bladder non-palpable Extremities:  No LE edema.  Right upper extremity AV fistula with good thrill and bruit. Skin:  No skin rash Psych:  normal mood and affect Neuro:  no focal deficits   CURRENT MEDICATIONS:  . sodium chloride   Intravenous Once  . atorvastatin  20 mg Oral q1800  . brimonidine  1 drop Both Eyes BID   And  . timolol  1 drop Both Eyes BID  . calcitRIOL  0.25 mcg Oral Q M,W,F  . carvedilol  3.125 mg Oral BID WC  . insulin aspart  0-9 Units Subcutaneous TID WC  . latanoprost  1 drop Both Eyes QHS  . ondansetron  4 mg Intravenous Once  . pantoprazole  40 mg Oral Daily  . predniSONE  5 mg Oral Daily  . vitamin B-12  1,000 mcg Oral Daily     HOME MEDICATIONS:  Prior to Admission medications   Medication Sig Start Date End Date Taking? Authorizing Provider  atorvastatin (LIPITOR) 20 MG tablet Take 20 mg by mouth daily.   Yes [provider]  brimonidine-timolol (COMBIGAN) 0.2-0.5 % ophthalmic solution Place 1 drop into both eyes 2 (two) times daily.   Yes [provider]  calcitRIOL  (ROCALTROL) 0.25 MCG capsule Take 0.25 mcg by mouth. Take 1 capsule every Monday, Wednesday, and Friday   Yes [provider]  carvedilol (COREG) 25 MG tablet TAKE 1 TABLET BY MOUTH TWICE A DAY WITH A MEAL Patient taking differently: Take 25 mg by mouth 2 (two) times daily with a meal. TAKE 1 TABLET BY MOUTH TWICE A DAY WITH A MEAL 06/21/18  Yes Jerline Pain, MD  Darbepoetin Alfa (ARANESP) 60 MCG/0.3ML SOSY injection Inject 60 mcg every 30 (thirty) days into the skin.   Yes [provider]  diltiazem (CARDIZEM CD) 120 MG 24 hr  capsule Take 120 mg by mouth daily.   Yes [provider]  diphenhydramine-acetaminophen (TYLENOL PM) 25-500 MG TABS tablet Take 1 tablet by mouth at bedtime.    Yes [provider]  febuxostat (ULORIC) 40 MG tablet Take 40 mg by mouth daily. Pt will check with her provider on when to start this medication   Yes [provider]  furosemide (LASIX) 20 MG tablet Take 80 mg by mouth 2 (two) times daily.    Yes [provider]  hydrALAZINE (APRESOLINE) 50 MG tablet Take 50 mg by mouth 3 (three) times daily.    Yes [provider]  KLOR-CON M20 20 MEQ tablet TAKE 1 TABLET BY MOUTH EVERY DAY Patient taking differently: Take 20 mEq by mouth daily.  02/17/19  Yes Jerline Pain, MD  latanoprost (XALATAN) 0.005 % ophthalmic solution Place 1 drop into both eyes at bedtime. 01/19/19  Yes [provider]  leflunomide (ARAVA) 10 MG tablet Take 10 mg by mouth daily.    Yes [provider]  Magnesium Oxide 400 (240 Mg) MG TABS Take 400 mg by mouth 2 (two) times daily. 05/27/18  Yes [provider]  pantoprazole (PROTONIX) 40 MG tablet Take 1 tablet (40 mg total) by mouth daily. 10/29/17  Yes Vann, Jessica U, DO  predniSONE (DELTASONE) 5 MG tablet Take 5 mg by mouth daily.  08/22/17  Yes [provider]  [place] into the lungs at bedtime. CPAP   Yes [provider]   Probiotic Product (ALIGN PO) Take 1 tablet by mouth at bedtime.    Yes [provider]  vitamin B-12 (CYANOCOBALAMIN) 1000 MCG tablet Take 1,000 mcg by mouth daily.   Yes [provider]  warfarin (COUMADIN) 5 MG tablet Take as directed by [facility] Patient taking differently: Take 2.5 mg by mouth daily. Take as directed by [facility] 03/04/19  Yes Jerline Pain, MD       LABS:  CBC Latest Ref Rng & Units 03/27/2019 03/23/2019 03/18/2019  WBC 4.0 - 10.5 K/uL 11.6(H) 14.1(H) 16.2(H)  Hemoglobin 12.0 - 15.0 g/dL 10.8(L) 11.0(L) 11.9(L)  Hematocrit 36.0 - 46.0 % 35.8(L) 37.4 42.3  Platelets 150 - 400 K/uL 92(L) 132(L) 148(L)    CMP Latest Ref Rng & Units 03/27/2019 03/11/2019 03/24/2019  Glucose 70 - 99 mg/dL 103(H) 120(H) 177(H)  BUN 8 - 23 mg/dL 108(H) 75(H) 72(H)  Creatinine 0.44 - 1.00 mg/dL 6.78(H) 4.19(H) 3.65(H)  Sodium 135 - 145 mmol/L 136 142 142  Potassium 3.5 - 5.1 mmol/L 5.5(H) 4.5 5.2(H)  Chloride 98 - 111 mmol/L 102 107 103  CO2 22 - 32 mmol/L 15(L) 26 26  Calcium 8.9 - 10.3 mg/dL 7.4(L) 7.9(L) 8.4(L)  Total Protein 6.5 - 8.1 g/dL - 5.1(L) 5.2(L)  Total Bilirubin 0.3 - 1.2 mg/dL - 1.3(H) 1.8(H)  Alkaline Phos 38 - 126 U/L - 70 71  AST 15 - 41 U/L - 60(H) 57(H)  ALT 0 - 44 U/L - 59(H) 62(H)    Lab Results  Component Value Date   PTH 80 (H) 03/09/2019   PTH Comment 03/09/2019   CALCIUM 7.4 (L) 03/27/2019   PHOS 4.2 01/30/2019       Component Value Date/Time   COLORURINE STRAW (A) 01/24/2019 1645   APPEARANCEUR CLEAR 01/24/2019 1645   LABSPEC 1.008 01/24/2019 1645   PHURINE 5.0 01/24/2019 [place] 01/24/2019 1645   HGBUR NEGATIVE 01/24/2019 [place] 01/24/2019 1645  KETONESUR NEGATIVE 01/24/2019 1645   PROTEINUR 30 (A) 01/24/2019 1645   UROBILINOGEN 0.2 04/28/2010 0304   NITRITE NEGATIVE 01/24/2019 1645   LEUKOCYTESUR NEGATIVE 01/24/2019 1645   No results found for: PHART, PCO2ART, PO2ART, HCO3,  TCO2, ACIDBASEDEF, O2SAT     Component Value Date/Time   IRON 53 03/09/2019 1418   TIBC 316 03/09/2019 1418   FERRITIN 81 03/09/2019 1418   IRONPCTSAT 17 03/09/2019 1418       ASSESSMENT/PLAN:     Problem List Items Addressed This Visit      Other   * (Principal) Perineal abscess    Other Visit Diagnoses    Cellulitis of perineum    -  Primary   SOB (shortness of breath)       Relevant Orders   DG CHEST PORT 1 VIEW (Completed)      1.  Chronic kidney disease stage IV.  Her baseline serum creatinine runs between 3.5 and 4.  She has a mature right upper extremity AV fistula.  Chronic kidney disease likely in the basis of longstanding hypertension and diabetes.  2.  Acute kidney injury.  Likely on the basis of decreased renal perfusion in the setting of low blood pressure.  Will check a urinalysis.  Does not appear to be fluid overloaded.  Minimal urine output noted.  Will attempt IV fluids with bicarbonate.  If no significant improvement in the next 24 hours, will need to start dialysis.  3.  Hyperkalemia.  This is mild, and likely secondary to acute kidney injury.  Will start IV fluids with bicarbonate.  4.  Anion gap metabolic acidosis.  Likely secondary to progressive renal dysfunction.    [place], DO, [place]

## 2019-03-28 ENCOUNTER — Inpatient Hospital Stay (HOSPITAL_COMMUNITY): Payer: Medicare Other

## 2019-03-28 DIAGNOSIS — I10 Essential (primary) hypertension: Secondary | ICD-10-CM

## 2019-03-28 DIAGNOSIS — G4733 Obstructive sleep apnea (adult) (pediatric): Secondary | ICD-10-CM

## 2019-03-28 DIAGNOSIS — J9601 Acute respiratory failure with hypoxia: Secondary | ICD-10-CM

## 2019-03-28 DIAGNOSIS — I371 Nonrheumatic pulmonary valve insufficiency: Secondary | ICD-10-CM

## 2019-03-28 DIAGNOSIS — I469 Cardiac arrest, cause unspecified: Secondary | ICD-10-CM

## 2019-03-28 DIAGNOSIS — I4821 Permanent atrial fibrillation: Secondary | ICD-10-CM

## 2019-03-28 DIAGNOSIS — Z789 Other specified health status: Secondary | ICD-10-CM

## 2019-03-28 DIAGNOSIS — E1122 Type 2 diabetes mellitus with diabetic chronic kidney disease: Secondary | ICD-10-CM

## 2019-03-28 DIAGNOSIS — I361 Nonrheumatic tricuspid (valve) insufficiency: Secondary | ICD-10-CM

## 2019-03-28 DIAGNOSIS — N179 Acute kidney failure, unspecified: Secondary | ICD-10-CM

## 2019-03-28 DIAGNOSIS — N184 Chronic kidney disease, stage 4 (severe): Secondary | ICD-10-CM

## 2019-03-28 LAB — CBC
HCT: 33.3 % — ABNORMAL LOW (ref 36.0–46.0)
Hemoglobin: 10.1 g/dL — ABNORMAL LOW (ref 12.0–15.0)
MCH: 27.1 pg (ref 26.0–34.0)
MCHC: 30.3 g/dL (ref 30.0–36.0)
MCV: 89.3 fL (ref 80.0–100.0)
PLATELETS: 80 10*3/uL — AB (ref 150–400)
RBC: 3.73 MIL/uL — ABNORMAL LOW (ref 3.87–5.11)
RDW: 15.9 % — ABNORMAL HIGH (ref 11.5–15.5)
WBC: 11.1 10*3/uL — AB (ref 4.0–10.5)
nRBC: 1.2 % — ABNORMAL HIGH (ref 0.0–0.2)

## 2019-03-28 LAB — ECHOCARDIOGRAM LIMITED
Height: 62 in
WEIGHTICAEL: 3950.64 [oz_av]

## 2019-03-28 LAB — BASIC METABOLIC PANEL
Anion gap: 17 — ABNORMAL HIGH (ref 5–15)
BUN: 120 mg/dL — ABNORMAL HIGH (ref 8–23)
CO2: 20 mmol/L — ABNORMAL LOW (ref 22–32)
Calcium: 7 mg/dL — ABNORMAL LOW (ref 8.9–10.3)
Chloride: 97 mmol/L — ABNORMAL LOW (ref 98–111)
Creatinine, Ser: 8.06 mg/dL — ABNORMAL HIGH (ref 0.44–1.00)
GFR calc non Af Amer: 5 mL/min — ABNORMAL LOW (ref >60)
GFR, EST AFRICAN AMERICAN: 5 mL/min — AB (ref >60)
Glucose, Bld: 195 mg/dL — ABNORMAL HIGH (ref 70–99)
Potassium: 5.2 mmol/L — ABNORMAL HIGH (ref 3.5–5.1)
Sodium: 134 mmol/L — ABNORMAL LOW (ref 135–145)

## 2019-03-28 LAB — BLOOD GAS, ARTERIAL
Acid-base deficit: 5 mmol/L — ABNORMAL HIGH (ref 0.0–2.0)
Bicarbonate: 20.2 mmol/L (ref 20.0–28.0)
DELIVERY SYSTEMS: POSITIVE
DRAWN BY: 205171
Expiratory PAP: 8
FIO2: 100
INSPIRATORY PAP: 16
O2 Saturation: 97.8 %
Patient temperature: 98.6
pCO2 arterial: 42.1 mmHg (ref 32.0–48.0)
pH, Arterial: 7.303 — ABNORMAL LOW (ref 7.350–7.450)
pO2, Arterial: 364 mmHg — ABNORMAL HIGH (ref 83.0–108.0)

## 2019-03-28 LAB — GLUCOSE, CAPILLARY
GLUCOSE-CAPILLARY: 160 mg/dL — AB (ref 70–99)
Glucose-Capillary: 176 mg/dL — ABNORMAL HIGH (ref 70–99)
Glucose-Capillary: 157 mg/dL — ABNORMAL HIGH (ref 70–99)
Glucose-Capillary: 148 mg/dL — ABNORMAL HIGH (ref 70–99)
Glucose-Capillary: 129 mg/dL — ABNORMAL HIGH (ref 70–99)

## 2019-03-28 LAB — COMPREHENSIVE METABOLIC PANEL
ALT: 351 U/L — ABNORMAL HIGH (ref 0–44)
AST: 201 U/L — ABNORMAL HIGH (ref 15–41)
Albumin: 2.7 g/dL — ABNORMAL LOW (ref 3.5–5.0)
Alkaline Phosphatase: 135 U/L — ABNORMAL HIGH (ref 38–126)
Anion gap: 13 (ref 5–15)
BUN: 122 mg/dL — ABNORMAL HIGH (ref 8–23)
CALCIUM: 6.9 mg/dL — AB (ref 8.9–10.3)
CO2: 25 mmol/L (ref 22–32)
Chloride: 98 mmol/L (ref 98–111)
Creatinine, Ser: 8 mg/dL — ABNORMAL HIGH (ref 0.44–1.00)
GFR calc Af Amer: 5 mL/min — ABNORMAL LOW (ref >60)
GFR calc non Af Amer: 5 mL/min — ABNORMAL LOW (ref >60)
Glucose, Bld: 183 mg/dL — ABNORMAL HIGH (ref 70–99)
Potassium: 4.8 mmol/L (ref 3.5–5.1)
Sodium: 136 mmol/L (ref 135–145)
Total Bilirubin: 1 mg/dL (ref 0.3–1.2)
Total Protein: 4.9 g/dL — ABNORMAL LOW (ref 6.5–8.1)

## 2019-03-28 LAB — LACTIC ACID, PLASMA
Lactic Acid, Venous: 1.3 mmol/L (ref 0.5–1.9)
Lactic Acid, Venous: 1.3 mmol/L (ref 0.5–1.9)

## 2019-03-28 LAB — PROTIME-INR
INR: 3.8 — ABNORMAL HIGH (ref 0.8–1.2)
Prothrombin Time: 36.6 seconds — ABNORMAL HIGH (ref 11.4–15.2)

## 2019-03-28 LAB — BETA-HYDROXYBUTYRIC ACID: BETA-HYDROXYBUTYRIC ACID: 0.19 mmol/L (ref 0.05–0.27)

## 2019-03-28 LAB — TROPONIN I: Troponin I: 0.73 ng/mL (ref <0.03)

## 2019-03-28 MED ORDER — ACETAMINOPHEN 325 MG PO TABS
650.0000 mg | ORAL_TABLET | Freq: Once | ORAL | Status: AC
  Administered 2019-03-28: 650 mg via ORAL
  Filled 2019-03-28: qty 2

## 2019-03-28 MED ORDER — SODIUM CHLORIDE 0.9 % IV SOLN: 100.0000 mL | INTRAVENOUS | Status: DC | PRN

## 2019-03-28 MED ORDER — CARVEDILOL 3.125 MG PO TABS: 3.1250 mg | ORAL_TABLET | Freq: Every day | ORAL | Status: DC

## 2019-03-28 MED ORDER — CHLORHEXIDINE GLUCONATE CLOTH 2 % EX PADS
6.0000 | MEDICATED_PAD | Freq: Every day | CUTANEOUS | Status: DC
  Administered 2019-03-29: 6 via TOPICAL

## 2019-03-28 MED ORDER — HEPARIN SODIUM (PORCINE) 1000 UNIT/ML DIALYSIS: 1000.0000 [IU] | INTRAMUSCULAR | Status: DC | PRN

## 2019-03-28 MED ORDER — PENTAFLUOROPROP-TETRAFLUOROETH EX AERO: 1.0000 "application " | INHALATION_SPRAY | CUTANEOUS | Status: DC | PRN

## 2019-03-28 MED ORDER — PERFLUTREN LIPID MICROSPHERE
1.0000 mL | INTRAVENOUS | Status: AC | PRN
  Administered 2019-03-28: 4 mL via INTRAVENOUS
  Filled 2019-03-28: qty 10

## 2019-03-28 MED ORDER — LIDOCAINE HCL (PF) 1 % IJ SOLN: 5.0000 mL | INTRAMUSCULAR | Status: DC | PRN

## 2019-03-28 MED ORDER — ONDANSETRON HCL 4 MG/2ML IJ SOLN
4.0000 mg | Freq: Three times a day (TID) | INTRAMUSCULAR | Status: DC | PRN
  Administered 2019-03-28 – 2019-03-29 (×2): 4 mg via INTRAVENOUS
  Filled 2019-03-28: qty 2

## 2019-03-28 MED ORDER — ALTEPLASE 2 MG IJ SOLR: 2.0000 mg | Freq: Once | INTRAMUSCULAR | Status: DC | PRN

## 2019-03-28 MED ORDER — LIDOCAINE-PRILOCAINE 2.5-2.5 % EX CREA
1.0000 "application " | TOPICAL_CREAM | CUTANEOUS | Status: DC | PRN
  Filled 2019-03-28: qty 5

## 2019-03-28 MED ORDER — NOREPINEPHRINE 4 MG/250ML-% IV SOLN
0.0000 ug/min | INTRAVENOUS | Status: DC
  Administered 2019-03-28: 5 ug/min via INTRAVENOUS
  Filled 2019-03-28: qty 250

## 2019-03-28 MED ORDER — NALOXONE HCL 0.4 MG/ML IJ SOLN
INTRAMUSCULAR | Status: AC
  Administered 2019-03-28: 11:00:00
  Filled 2019-03-28: qty 1

## 2019-03-28 MED ORDER — OXYCODONE-ACETAMINOPHEN 5-325 MG PO TABS: 1.0000 | ORAL_TABLET | Freq: Three times a day (TID) | ORAL | Status: DC | PRN

## 2019-03-28 NOTE — Progress Notes (Signed)
Physical Therapy Treatment Patient Details Name: Melissa Bartlett MRN: [id number] DOB: 06/06/1946 Today's Date: 03/28/2019    History of Present Illness Melissa Bartlett is a 73 y.o. female with medical history significant of CHF, CKD, diabetes, essential hypertension, morbid obesity, obstructive sleep apnea, paroxysmal atrial fibrillation, rheumatoid arthritis. 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    PT Comments    Patient seen for mobility progression. Pt is limited by nausea. RN notified. Pt tolerated short distance inside room only and overall min guard/min A this session.  Continue to progress as tolerated.   Follow Up Recommendations  Home health PT;Supervision/Assistance - 24 hour     Equipment Recommendations  Rolling walker with 5" wheels;3in1 (PT)    Recommendations for Other Services       Precautions / Restrictions Precautions Precautions: Fall Restrictions Weight Bearing Restrictions: No    Mobility  Bed Mobility Overal bed mobility: Needs Assistance Bed Mobility: Supine to Sit     Supine to sit: Min assist     General bed mobility comments: pt OOB in chair upon arrival  Transfers Overall transfer level: Needs assistance Equipment used: None(grab bar when standing from commode) Transfers: Sit to/from Stand Sit to Stand: Min guard         General transfer comment: min guard for safety; cues for safe hand placement and use of AD upon standing; when leaving bathroom pt required cues to place both hands on RW as pt was ambulating with hand on rail adn one on RW   Ambulation/Gait Ambulation/Gait assistance: Min assist Gait Distance (Feet): (10 ft X 2 trials) Assistive device: Rolling walker (2 wheeled) Gait Pattern/deviations: Step-through pattern;Decreased stride length;Trunk flexed     General Gait Details: pt with flexed posture and relying  heavily on RW; min A to steady from bathroom to recliner due to nausea and pt closing eyes at times   Stairs             Wheelchair Mobility    Modified Rankin (Stroke Patients Only)       Balance Overall balance assessment: Needs assistance   Sitting balance-Leahy Scale: Fair     Standing balance support: Bilateral upper extremity supported;During functional activity Standing balance-Leahy Scale: Poor                              Cognition Arousal/Alertness: (appears drowsy (in and out awake and drowsy)) Behavior During Therapy: WFL for tasks assessed/performed Overall Cognitive Status: Within Functional Limits for tasks assessed                                 General Comments: pt having trouble focusing on tasks due to c/o nausea      Exercises      General Comments General comments (skin integrity, edema, etc.): BP supine 108/73, sitting EOB 105/70, standing 113/93.       Pertinent Vitals/Pain Pain Assessment: Faces Faces Pain Scale: Hurts little more Pain Location: perianal surgical site Pain Descriptors / Indicators: Operative site guarding;Grimacing Pain Intervention(s): Monitored during session;Repositioned    Home Living Family/patient expects to be discharged to:: Private residence Living Arrangements: Spouse/significant other;Children(Husband and daughter) Available Help at Discharge: Family;Available 24 hours/day("Well now they will be home with this virus") Type of Home: House Home Access: Stairs to enter  Entrance Stairs-Rails: Right Home Layout: One level Home Equipment: Cane - single point;Tub bench      Prior Function Level of Independence: Independent      Comments: Performs ADLs and light IADLs. Husband and daughter assist with tying shoes and IADLs.    PT Goals (current goals can now be found in the care plan section) Acute Rehab PT Goals Patient Stated Goal: she really hopes to be able to get  home Progress towards PT goals: Progressing toward goals    Frequency    Min 3X/week      PT Plan Current plan remains appropriate    Co-evaluation              AM-PAC PT "6 Clicks" Mobility   Outcome Measure  Help needed turning from your back to your side while in a flat bed without using bedrails?: None Help needed moving from lying on your back to sitting on the side of a flat bed without using bedrails?: A Little Help needed moving to and from a bed to a chair (including a wheelchair)?: A Little Help needed standing up from a chair using your arms (e.g., wheelchair or bedside chair)?: A Little Help needed to walk in hospital room?: A Little Help needed climbing 3-5 steps with a railing? : A Little 6 Click Score: 19    End of Session Equipment Utilized During Treatment: Gait belt Activity Tolerance: Other (comment)(pt limited by nausea ) Patient left: in chair;with call bell/phone within reach Nurse Communication: Mobility status PT Visit Diagnosis: Unsteadiness on feet (R26.81);Other abnormalities of gait and mobility (R26.89);Pain Pain - part of body: (perianal)     Time: 1000-1020 PT Time Calculation (min) (ACUTE ONLY): 20 min  Charges:  $Gait Training: 8-22 mins                     Earney Navy, PTA Acute Rehabilitation Services Pager: [phone] Office: [phone]     Darliss Cheney 03/28/2019, 10:40 AM

## 2019-03-28 NOTE — Progress Notes (Signed)
Arrive to 6N for assistance for CPR. Patient in alert and oriented when RT arrived. Placed patient on CPAP due to [facility]. Assisted with transport to @H . Placed patient on charted BIPAP setting. No noted respiratory distress at this time.

## 2019-03-28 NOTE — Consult Note (Signed)
NAME:  Melissa Bartlett, MRN:  [id number], DOB:  Apr 02, 1946, LOS: 4 ADMISSION DATE:  03/10/2019, CONSULTATION DATE:  3/30 REFERRING MD:  [place] Crane, CHIEF COMPLAINT:  S/p cardiac arrest   Brief History   73 year old female patient initially referred to critical care on 3/25 to assist with postoperative hypotension following excision and drainage of perianal abscess.  Critical care signed off on 3/27.  Surgery signed off 3/28.  On 3/29 having worsening renal failure and oliguria, nephrology consulted.  Started on IV fluids with bicarbonate replacement.  On 3/30 had initially been looking better, found by nursing staff Unresponsive.  Was pulseless and hypoxic.  Monitor underwent a total of 4 minutes of ACLS before return of spontaneous circulation.  Was awake and following commands after this.  Was transferred to ICU status post arrest for ongoing hypotension  Past Medical History  CHF, paroxysmal atrial fibrillation, CKD stage IV, hypertension, diabetes type 2, obstructive sleep apnea, obesity, RA.  Significant Hospital Events   3/25: Admitted with perianal abscess went to the OR for excision and drainage.  Pulmonary initially consulted to assist with hypotension management antibiotics started 3/27: Critical care signed off 3/28: Surgical service is signing off 3/29: Worsening renal failure and oliguria nephrology consulted 3/30: Brief PEA arrest estimated 4 minutes before return of spontaneous circulation awake and following commands post code critical care asked to see again  Consults:  Surgical services consulted 3/25, signed off 3/28 Critical care reconsulted on 3/30 Nephrology 3/29  Procedures:  Incision and drainage of perineal rectal abscess on 3/26 Left IJ triple-lumen catheter 3/30  Significant Diagnostic Tests:   CT abdomen pelvis 3/25:Superficial left perineum abscess with questionable communication to the anus measures 29 x 19 x 35 mm. See series 8, image 122. Regional cellulitis.  No subcutaneous gas.  Micro Data:  Perineal abscess culture 3/26: Moderate GPC in pairs, moderate gram variable rods>>> 3/25: Blood cultures>>>  Antimicrobials:  Zosyn 3/27 through 3/28  Interim history/subjective:  Now status post brief cardiac arrest arriving in the intensive care  Objective   Blood pressure 114/66, pulse 95, temperature 98.5 F (36.9 C), temperature source Oral, resp. rate 18, height 5\' 2"  (1.575 m), weight 103.8 kg, SpO2 97 %.        Intake/Output Summary (Last 24 hours) at 03/28/2019 1117 Last data filed at 03/28/2019 0500 Gross per 24 hour  Intake 1495.77 ml  Output 200 ml  Net 1295.77 ml   Filed Weights   03/02/2019 1951 03/12/2019 0836  Weight: 103.9 kg 103.8 kg    Examination: General: Pleasant 73 year old female currently on CPAP no acute distress HENT: Normocephalic atraumatic left IJ triple-lumen catheter now in place, oozing blood Lungs: Clear to auscultation Cardiovascular: Regular rate, atrial fibrillation with CVR  Abdomen: Soft not tender Extremities: Warm and dry Neuro: Awake oriented no focal deficits GU: Due to void  Resolved Hospital Problem list     Assessment & Plan:  Status post brief PEA arrest -Suspect acutely mediated by hypoxia superimposed on acute on chronic renal failure and acidosis, hypotension and PRN narcotics Plan Cycle troponins ECHO Tele DC narcotics  Shock.  Seemingly transient suspect mix of acidosis and residual narcotic Plan Triple-lumen placed Assess central venous pressure Norepinephrine on standby M AP goal greater than 65  Acute on chronic renal failure CKD stage IV w/ hyperkalemia Serum creatinine continues to worsen Minimal urine output Plan Assess CVP Renal dose medications Rising potassium +/-  Hypoxia at this point would be definitive factors in going forward  w/ HD  Worsening anion gap metabolic acidosis in setting of acute renal failure  Plan Ck LA and hydroxy butyric acid abg  ? HD  per renal   Acute hypoxic respiratory failure.  Desaturating on floor.  ? Edema superimposed on underlying OSA Plan CXR now CPAP PRN F/u abg  DM w/ hyperglycemia Plan ssi   Af w/ occ RVR. currently w/ CVR Chronic diastolic HF Plan Cont tele  Hold antihypertensives  GERD Plan PPI   Mild coagulopathy. Previously on [place] INR  Hold warfarin  S/p I&D of perirectal abscess Plan Cont wound care  - Best practice:  Diet: sips of clears 3/30 Pain/Anxiety/Delirium protocol (if indicated): na VAP protocol (if indicated): na DVT prophylaxis: scd GI prophylaxis: ppi Glucose control: ssi Mobility: BR Code Status: full code  Family Communication: updated  Disposition: Critically ill status post cardiac arrest.  Suspect multifactorial but primarily narcotic related superimposed on underlying acidosis and worsening renal failure.  Requires close observation of pulse oximetry given worsening respiratory failure, close observation and titration of hemodynamic drips.  A new central line has been placed.  Labs   CBC: Recent Labs  Lab 03/03/2019 2120 03/08/2019 0543 03/27/19 0229  WBC 16.2* 14.1* 11.6*  NEUTROABS 13.2*  --  9.6*  HGB 11.9* 11.0* 10.8*  HCT 42.3 37.4 35.8*  MCV 94.4 93.0 90.9  PLT 148* 132* 92*    Basic Metabolic Panel: Recent Labs  Lab 03/21/2019 2157 03/08/2019 0543 03/27/19 0229 03/28/19 0224  NA 142 142 136 134*  K 5.2* 4.5 5.5* 5.2*  CL 103 107 102 97*  CO2 26 26 15* 20*  GLUCOSE 177* 120* 103* 195*  BUN 72* 75* 108* 120*  CREATININE 3.65* 4.19* 6.78* 8.06*  CALCIUM 8.4* 7.9* 7.4* 7.0*   GFR: Estimated Creatinine Clearance: 7.1 mL/min (A) (by C-G formula based on SCr of 8.06 mg/dL (H)). Recent Labs  Lab 03/21/2019 2120 03/22/2019 2318 03/17/2019 0543 03/27/19 0229  WBC 16.2*  --  14.1* 11.6*  LATICACIDVEN  --  1.5  --   --     Liver Function Tests: Recent Labs  Lab 03/02/2019 2157 03/23/2019 0543  AST 57* 60*  ALT 62* 59*   ALKPHOS 71 70  BILITOT 1.8* 1.3*  PROT 5.2* 5.1*  ALBUMIN 2.7* 2.6*   No results for input(s): LIPASE, AMYLASE in the last 168 hours. No results for input(s): AMMONIA in the last 168 hours.  ABG No results found for: PHART, PCO2ART, PO2ART, HCO3, TCO2, ACIDBASEDEF, O2SAT   Coagulation Profile: Recent Labs  Lab 03/25/2019 0543 03/25/19 0300 03/26/19 0455 03/27/19 0229 03/28/19 0224  INR 2.2* 2.6* 3.6* 3.7* 3.8*    Cardiac Enzymes: Recent Labs  Lab 03/07/2019 2204  TROPONINI 0.09*    HbA1C: Hgb A1c MFr Bld  Date/Time Value Ref Range Status  05/16/2018 05:47 PM 5.9 (H) 4.8 - 5.6 % Final    Comment:    (NOTE) Pre diabetes:          5.7%-6.4% Diabetes:              >6.4% Glycemic control for   <7.0% adults with diabetes   07/06/2011 02:16 AM 6.9 (H) <5.7 % Final    Comment:    (NOTE)  According to the ADA Clinical Practice Recommendations for 2011, when HbA1c is used as a screening test:  >=6.5%   Diagnostic of Diabetes Mellitus           (if abnormal result is confirmed) 5.7-6.4%   Increased risk of developing Diabetes Mellitus References:Diagnosis and Classification of Diabetes Mellitus,Diabetes PZWC,5852,77(OEUMP 1):S62-S69 and Standards of Medical Care in         Diabetes - 2011,Diabetes NTIR,4431,54 (Suppl 1):S11-S61.    CBG: Recent Labs  Lab 03/27/19 1202 03/27/19 1603 03/27/19 2124 03/28/19 0748 03/28/19 1053  GLUCAP 87 150* 166* 160* 176*    Review of Systems:   na  Past Medical History  She,  has a past medical history of Anemia, CHF (congestive heart failure) ([place]), CKD (chronic kidney disease), stage IV ([place]), Complication of anesthesia, Diabetes ([place]), GERD (gastroesophageal reflux disease), Heart murmur, HTN (hypertension), Morbid obesity with BMI of 45.0-49.9, adult ([place]), OSA (obstructive sleep apnea), PAF (paroxysmal atrial fibrillation) ([place]), Pneumonia, RA (rheumatoid  arthritis) ([place]), Wears glasses, and Wears partial dentures.   Surgical History    Past Surgical History:  Procedure Laterality Date  . ABDOMINAL HYSTERECTOMY    . ANKLE FUSION Right   . AV FISTULA PLACEMENT Right 07/14/2018   Procedure: ARTERIOVENOUS (AV) FISTULA CREATION BRACHIOCEPHALIC;  Surgeon: Elam Dutch, MD;  Location: [facility];  Service: Vascular;  Laterality: Right;  . BREAST BIOPSY    . CARDIOVERSION N/A 08/15/2016   Procedure: CARDIOVERSION;  Surgeon: Jerline Pain, MD;  Location[facility] ENDOSCOPY;  Service: Cardiovascular;  Laterality: N/A;  . CHOLECYSTECTOMY    . COLONOSCOPY W/ BIOPSIES AND POLYPECTOMY    . CONDYLOMA EXCISION/FULGURATION N/A 02/27/2019   Procedure: Condyloma Removal;  Surgeon: Ileana Roup, MD;  Location: [place];  Service: General;  Laterality: N/A;  . DILATION AND CURETTAGE OF UTERUS    . FISTULA SUPERFICIALIZATION Right 01/03/2019   Procedure: SUPERFICIALIZATION OF RIGHT ARM FISTULA;  Surgeon: Elam Dutch, MD;  Location: [place];  Service: Vascular;  Laterality: Right;  . GALLBLADDER SURGERY    . HYSTEROTOMY     Patrtial  . HYSTEROTOMY     Complete  . INCISION AND DRAINAGE PERIRECTAL ABSCESS N/A 03/14/2019   Procedure: IRRIGATION AND DEBRIDEMENT PERIANAL ABSCESS;  Surgeon: Ileana Roup, MD;  Location: [place];  Service: General;  Laterality: N/A;  . KNEE ARTHROSCOPY    . LEFT HEART CATH AND CORONARY ANGIOGRAPHY N/A 11/16/2017   Procedure: LEFT HEART CATH AND CORONARY ANGIOGRAPHY;  Surgeon: Martinique, Dilana Mcphie M, MD;  Location: [facility] CV LAB;  Service: Cardiovascular;  Laterality: N/A;  . MULTIPLE TOOTH EXTRACTIONS    . RECTAL EXAM UNDER ANESTHESIA N/A 03/03/2019   Procedure: Rectal Exam Under Anesthesia;  Surgeon: Ileana Roup, MD;  Location: [facility];  Service: General;  Laterality: N/A;  . TUBAL LIGATION       Social History   reports that she quit smoking about 2 years ago. Her smoking use included cigarettes. She has never used  smokeless tobacco. She reports that she does not drink alcohol or use drugs.   Family History   Her family history includes Hypertension in her mother; Stomach cancer in her father.   Allergies Allergies  Allergen Reactions  . Enalapril Other (See Comments)    Systemic reaction - jerking  . Codeine Itching     Home Medications  Prior to Admission medications   Medication Sig Start Date End Date Taking? Authorizing Provider  atorvastatin (LIPITOR) 20 MG tablet  Take 20 mg by mouth daily.   Yes [provider]  brimonidine-timolol (COMBIGAN) 0.2-0.5 % ophthalmic solution Place 1 drop into both eyes 2 (two) times daily.   Yes [provider]  calcitRIOL (ROCALTROL) 0.25 MCG capsule Take 0.25 mcg by mouth. Take 1 capsule every Monday, Wednesday, and Friday   Yes [provider]  carvedilol (COREG) 25 MG tablet TAKE 1 TABLET BY MOUTH TWICE A DAY WITH A MEAL Patient taking differently: Take 25 mg by mouth 2 (two) times daily with a meal. TAKE 1 TABLET BY MOUTH TWICE A DAY WITH A MEAL 06/21/18  Yes Jerline Pain, MD  Darbepoetin Alfa (ARANESP) 60 MCG/0.3ML SOSY injection Inject 60 mcg every 30 (thirty) days into the skin.   Yes [provider]  diltiazem (CARDIZEM CD) 120 MG 24 hr capsule Take 120 mg by mouth daily.   Yes [provider]  diphenhydramine-acetaminophen (TYLENOL PM) 25-500 MG TABS tablet Take 1 tablet by mouth at bedtime.    Yes [provider]  febuxostat (ULORIC) 40 MG tablet Take 40 mg by mouth daily. Pt will check with her provider on when to start this medication   Yes [provider]  furosemide (LASIX) 20 MG tablet Take 80 mg by mouth 2 (two) times daily.    Yes [provider]  hydrALAZINE (APRESOLINE) 50 MG tablet Take 50 mg by mouth 3 (three) times daily.    Yes [provider]  KLOR-CON M20 20 MEQ tablet TAKE 1 TABLET BY MOUTH EVERY DAY Patient taking differently: Take 20 mEq by mouth daily.   02/17/19  Yes Jerline Pain, MD  latanoprost (XALATAN) 0.005 % ophthalmic solution Place 1 drop into both eyes at bedtime. 01/19/19  Yes [provider]  leflunomide (ARAVA) 10 MG tablet Take 10 mg by mouth daily.    Yes [provider]  Magnesium Oxide 400 (240 Mg) MG TABS Take 400 mg by mouth 2 (two) times daily. 05/27/18  Yes [provider]  pantoprazole (PROTONIX) 40 MG tablet Take 1 tablet (40 mg total) by mouth daily. 10/29/17  Yes Vann, Jessica U, DO  predniSONE (DELTASONE) 5 MG tablet Take 5 mg by mouth daily.  08/22/17  Yes [provider]  Throop into the lungs at bedtime. CPAP   Yes [provider]  Probiotic Product (ALIGN PO) Take 1 tablet by mouth at bedtime.    Yes [provider]  vitamin B-12 (CYANOCOBALAMIN) 1000 MCG tablet Take 1,000 mcg by mouth daily.   Yes [provider]  warfarin (COUMADIN) 5 MG tablet Take as directed by [facility] Patient taking differently: Take 2.5 mg by mouth daily. Take as directed by [facility] 03/04/19  Yes Jerline Pain, MD     Critical care time:  33 min      Erick Colace ACNP-BC [place] Pager # [phone] OR # [phone] if no answer

## 2019-03-28 NOTE — Progress Notes (Signed)
Patient was in her usual state of health until later this am she went into PEA. Code blue called; underwent 5 mins CPR with ROSC. Hypotensive with SBP in the 40's. Started on levophed with improvement of BP.   12 lead EKG reviewed. Stat Troponin x 3. CXR and labs pending. Cardiology consulted.

## 2019-03-28 NOTE — Evaluation (Signed)
Occupational Therapy Evaluation Patient Details Name: Melissa Bartlett: [id number] DOB: 1946/07/25 Today's Date: 03/28/2019    History of Present Illness Melissa Bartlett is a 73 y.o. female with medical history significant of CHF, CKD, diabetes, essential hypertension, morbid obesity, obstructive sleep apnea, paroxysmal atrial fibrillation, rheumatoid arthritis. 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   Clinical Impression   PTA, pt was living with her husband and daughter and was independent in ADLs and used Melissa Bartlett. Pt currently requiring Min A for LB ADLs and Min A for functional mobility. Pt with decreased activity tolerance due to pain and fatigue. With bed mobility and transfers, pt reporting dizziness and nausea. BP stable throughout. Notified RN. Pt would benefit from further acute OT to facilitate safe dc. Recommend dc to home once medically stable per physician.   BP supine 108/73, sitting EOB 105/70, and standing 113/93.  SpO2 >90% on RA.     Follow Up Recommendations  No OT follow up;Supervision/Assistance - 24 hour    Equipment Recommendations  None recommended by OT    Recommendations for Other Services PT consult     Precautions / Restrictions Precautions Precautions: Fall Restrictions Weight Bearing Restrictions: No      Mobility Bed Mobility Overal bed mobility: Needs Assistance Bed Mobility: Supine to Sit     Supine to sit: Min assist     General bed mobility comments: Pt hold OT's hand to pull into upright posture  Transfers Overall transfer level: Needs assistance Equipment used: None Transfers: Sit to/from Stand Sit to Stand: Min guard         General transfer comment: Min Guard A for safety in standing. Requiring Min A for single hand held A when initating mobility    Balance                                            ADL either performed or assessed with clinical judgement   ADL Overall ADL's : Needs assistance/impaired Eating/Feeding: Supervision/ safety;Set up;Sitting   Grooming: Supervision/safety;Set up;Sitting   Upper Body Bathing: Supervision/ safety;Set up;Sitting   Lower Body Bathing: Minimal assistance;Sit to/from stand   Upper Body Dressing : Supervision/safety;Set up;Sitting   Lower Body Dressing: Minimal assistance;Sit to/from stand   Toilet Transfer: Minimal assistance;Ambulation(single hand held A; simulated to recliner) Toilet Transfer Details (indicate cue type and reason): Short distance with single hand held A. Decreased balance and endurance         Functional mobility during ADLs: Minimal assistance(single hand held A) General ADL Comments: Pt presenting with decreased balance and activity tolerance due to pain and fatigue.      Vision         Perception     Praxis      Pertinent Vitals/Pain Pain Assessment: Faces Faces Pain Scale: Hurts little more Pain Location: perianal surgical site Pain Descriptors / Indicators: Operative site guarding;Grimacing Pain Intervention(s): Monitored during session;Limited activity within patient's tolerance;Repositioned     Hand Dominance     Extremity/Trunk Assessment Upper Extremity Assessment Upper Extremity Assessment: Generalized weakness   Lower Extremity Assessment Lower Extremity Assessment: Generalized weakness   Cervical / Trunk Assessment Cervical / Trunk Assessment: Other exceptions Cervical / Trunk Exceptions: Increased body habitus   Communication Communication Communication: No difficulties   Cognition Arousal/Alertness: Awake/alert Behavior  During Therapy: WFL for tasks assessed/performed Overall Cognitive Status: Within Functional Limits for tasks assessed                                     General Comments  BP supine 108/73, sitting EOB 105/70, standing 113/93.     Exercises      Shoulder Instructions      Home Living Family/patient expects to be discharged to:: Private residence Living Arrangements: Spouse/significant other;Children(Husband and daughter) Available Help at Discharge: Family;Available 24 hours/day("Well now they will be home with this virus") Type of Home: House Home Access: Stairs to enter [employer] of Steps: 2-3 Entrance Stairs-Rails: Right Home Layout: One level     Bathroom Shower/Tub: Teacher, early years/pre: Standard     Home Equipment: Cane - single point;Tub bench          Prior Functioning/Environment Level of Independence: Independent        Comments: Performs ADLs and light IADLs. Husband and daughter assist with tying shoes and IADLs.         OT Problem List: Decreased strength;Decreased range of motion;Decreased activity tolerance;Impaired balance (sitting and/or standing);Decreased safety awareness;Decreased knowledge of use of DME or AE;Decreased knowledge of precautions;Pain      OT Treatment/Interventions: Self-care/ADL training;Therapeutic exercise;Energy conservation;DME and/or AE instruction;Therapeutic activities;Patient/family education    OT Goals(Current goals can be found in the care plan section) Acute Rehab OT Goals Patient Stated Goal: she really hopes to be able to get home OT Goal Formulation: With patient Time For Goal Achievement: 04/11/19 Potential to Achieve Goals: Good  OT Frequency: Min 2X/week   Barriers to D/C:            Co-evaluation              AM-PAC OT "6 Clicks" Daily Activity     Outcome Measure Help from another person eating meals?: None Help from another person taking care of personal grooming?: A Little Help from another person toileting, which includes using toliet, bedpan, or urinal?: A Little Help from another person bathing (including washing, rinsing, drying)?: A Little Help from another person to put on and taking off regular  upper body clothing?: A Little Help from another person to put on and taking off regular lower body clothing?: A Lot 6 Click Score: 18   End of Session Nurse Communication: Mobility status(BP)  Activity Tolerance: Patient tolerated treatment well Patient left: in chair;with call bell/phone within reach  OT Visit Diagnosis: Unsteadiness on feet (R26.81);Other abnormalities of gait and mobility (R26.89);Muscle weakness (generalized) (M62.81);Pain Pain - Right/Left: Left Pain - part of body: (peri area)                Time: 9518-8416 OT Time Calculation (min): 26 min Charges:  OT General Charges $OT Visit: 1 Visit OT Evaluation $OT Eval Low Complexity: 1 Low OT Treatments $Self Care/Home Management : 8-22 mins  Ikram Riebe MSOT, OTR/L Acute Rehab Pager: [phone] Office: [place] 03/28/2019, 8:40 AM

## 2019-03-28 NOTE — TOC Initial Note (Addendum)
Transition of Care [facility]) - Initial/Assessment Note    Patient Details  Name: Melissa Bartlett MRN: [id number] Date of Birth: November 02, 1946  [facility]) CM/SW Contact:    Midge Minium RN, BSN, NCM-BC, ACM-RN [phone] Phone Number: 03/28/2019, 2:02 PM  Clinical Narrative:                 Patient transferred from [facility] to [place] after a PEA; currently on bipap. CM following for high-risk readmission screen and dispositional needs. Patient is starting HD and will need an OP HD seat arranged, with the Dialysis Coordinator following. CM team will continue to follow to arrange recommended services.   Expected Discharge Plan: [place] Barriers to Discharge: Continued Medical Work up, Waiting for outpatient dialysis   Expected Discharge Plan and Services Expected Discharge Plan: [place] In-house Referral: [facility](Active with [facility] PTA) Discharge Planning Services: CM Consult  Activities of Daily Living Home Assistive Devices/Equipment: Blood pressure cuff, CBG Meter, Dentures (specify type), Cane (specify quad or straight), Eyeglasses, Other (Comment), CPAP, Wheelchair(pulse oximeter) ADL Screening (condition at time of admission) Patient's cognitive ability adequate to safely complete daily activities?: Yes Is the patient deaf or have difficulty hearing?: No Does the patient have difficulty seeing, even when wearing glasses/contacts?: No(eyeglasses) Does the patient have difficulty concentrating, remembering, or making decisions?: No Patient able to express need for assistance with ADLs?: Yes Does the patient have difficulty dressing or bathing?: No Independently performs ADLs?: Yes (appropriate for developmental age) Does the patient have difficulty walking or climbing stairs?: Yes(sometimes difficult climbing up stairs) Weakness of Legs: None Weakness of Arms/Hands: None   Admission diagnosis:  Cellulitis of perineum [L03.315] Patient Active  Problem List   Diagnosis Date Noted  . Difficult intravenous access   . Perineal abscess 03/19/2019  . Perianal abscess 03/07/2019  . Long term (current) use of anticoagulants 02/07/2019  . Acute renal failure superimposed on stage 5 chronic kidney disease, not on chronic dialysis ([place]) 01/24/2019  . AKI (acute kidney injury) ([place]) 02/10/2018  . Acute respiratory failure with hypoxia ([place]) 01/27/2018  . Permanent atrial fibrillation 01/27/2018  . RA (rheumatoid arthritis) ([place]) 01/27/2018  . Acute diastolic CHF (congestive heart failure) ([place]) 01/27/2018  . Chest pain, rule out acute myocardial infarction 10/28/2017  . Syncope 03/14/2017  . Chest pain 03/14/2017  . OSA (obstructive sleep apnea)   . Paroxysmal atrial fibrillation ([place]) 07/31/2016  . CKD (chronic kidney disease) stage 4, GFR 15-29 ml/min ([facility]) 07/31/2016  . Essential hypertension 07/09/2015  . Morbid obesity ([place]) 07/09/2015  . DM (diabetes mellitus), type 2 with renal complications ([place]) 33/00/7622   PCP:  Donald Prose, MD Pharmacy:   [id number] - [place], [place] [address] Phone: [phone] Fax: [phone]     Social Determinants of Health (SDOH) Interventions    Readmission Risk Interventions No flowsheet data found.

## 2019-03-28 NOTE — Progress Notes (Signed)
  Echocardiogram 2D Echocardiogram limited with Definity has been performed.  Melissa Bartlett 03/28/2019, 2:25 PM

## 2019-03-28 NOTE — Progress Notes (Signed)
RN spoke with NP Kary Kos to confirm restarting bicarb drip, as patient transferred to floor without bicarb running. He advised to start bicarb until post hemodialysis and to talk to nephrology about continuing bicarb after hemodialysis. RN spoke with MD [employer] and she advised to stop bicarb after hemodialysis.

## 2019-03-28 NOTE — Code Documentation (Signed)
  Patient Name: Melissa Bartlett   MRN: [id number]   Date of Birth/ Sex: 10-24-1946 , female      Admission Date: 03/20/2019  Attending Provider: Kayleen Memos, DO  Primary Diagnosis: Perineal abscess   Indication: Pt was in her usual state of health until this AM, when she was noted to be unresponsive and developed PEA. Code blue was subsequently called. At the time of arrival on scene, ACLS protocol was underway.   Technical Description:  - CPR performance duration:  5 minutes  - Was defibrillation or cardioversion used? No   - Was external pacer placed? Yes  - Was patient intubated pre/post CPR? No   Medications Administered: Y = Yes; Blank = No Amiodarone    Atropine    Calcium    Epinephrine  Y  Lidocaine    Magnesium    Norepinephrine    Phenylephrine    Sodium bicarbonate  Y  Vasopressin     Post CPR evaluation:  - Final Status - Was patient successfully resuscitated ? Yes - What is current rhythm? NSR - What is current hemodynamic status? Hypotensive, BP down to 40 systolic. Started on Levophed.   Miscellaneous Information:  - Labs sent, including: CMP, CBC, lactate acid, troponin  - Primary team notified?  Yes  - Family Notified? Yes  - Additional notes/ transfer status: Transferred to ICU for pressor support and CRRT     Asencion Noble, MD  03/28/2019, 10:56 AM

## 2019-03-28 NOTE — Progress Notes (Signed)
PROGRESS NOTE  Melissa Bartlett DOB: 03-08-1946 DOA: 03/05/2019 PCP: Donald Prose, MD  HPI/Recap of past 24 hours: Melissa Bartlett is a 73 y.o. female with medical history significant of CHF, CKD, diabetes, essential hypertension, morbid obesity, obstructive sleep apnea, paroxysmal atrial fibrillation, rheumatoid arthritis.  Patient reports symptoms of pain in the perineum that started the day before presentation.  She has associated nausea, and diarrhea.  She reports a fever with a T-max of 100.7 F.  She has taken Preparation H in addition to trying to use an onion per her daughters suggestion which did not help her symptoms.  Symptoms worsened. Vitals in the ED: Labs: WBC of 16.2K, troponin of 0.09, INR 2.3 Imaging: CTA pelvis significant for an abscess located in left perineum with possible communication to the anus.  General surgery consulted and followed.  Post I&D on 03/11/2019.  Postoperative hypotension following excision and drainage for which she was started on phenylephrine and transferred to [facility] service.  Transferred to [facility] service on 03/26/2019.  Hospital course complicated by worsening AKI on CKD4 oliguria.  Nephrology consulted and following, highly appreciated.  03/28/19: Patient seen and examined at her bedside.  No acute events overnight.  No new complaints this morning.  She continues to have worsening AKI on CKD 4 and oliguria.  She denies chest pain, palpitations, dyspnea at rest, nausea or abdominal pain.  Had a bowel movement overnight and was unable to measure part of urine output.   Assessment/Plan: Principal Problem:   Perineal abscess Active Problems:   DM (diabetes mellitus), type 2 with renal complications (HCC)   Essential hypertension   Morbid obesity (HCC)   Paroxysmal atrial fibrillation (HCC)   CKD (chronic kidney disease) stage 4, GFR 15-29 ml/min (HCC)   OSA (obstructive sleep apnea)   Acute respiratory failure with hypoxia (HCC)   RA  (rheumatoid arthritis) ([place])   Perianal abscess  Resolving postoperative hypotension secondary to hypovolemia. Continue to hold off Lasix, p.o. Cardizem Continue Coreg at the smallest dose 3.125 mg twice daily Maintain map greater than 65 Continue to closely monitor vital signs  Worsening AKI on CKD 4 with oliguria suspect secondary to hypovolemia versus hypotension versus proteinuria History of proteinuria noted on prior urine analysis Repeat UA ordered on 03/27/2019 and pending Creatinine 8.06 from 6.78 yesterday Urine output recorded 200 cc in the last 24 hours Continue to avoid nephrotoxic agents/hypotension Right upper extremity AV fistula with good thrill On bicarb infusion due to metabolic acidosis Nephrology following Continue renal carb diet Repeat BMP in the morning  Acute metabolic acidosis secondary to acute on chronic renal failure On bicarb infusion for nephrology at 75 cc/h Bicarb improving from 15 to 20 Repeat BMP in the morning  Hyperkalemia Potassium 5.2 from 5.5 Suspect secondary to worsening renal function Repeat BMP in the morning  Chronic A. Fib with occasional RvR Rate is Currently controlled on Coreg Supratherapeutic INR on Coumadin for primary CVA prevention  INR supratherapeutic at 3.8  Persistent supratherapeutic INR INR 3.8 on 03/28/2019 Pharmacy managing Continue to hold off Coumadin  Perineal abscess status post I&D on 03/28/2019 DC abx 03/25/19 as recommended by general surgery Dressing changes as recommended by general surgery Afebrile WBC trending down from 14 K to 11 K Will follow-up with general surgery outpatient  Moderate protein calorie malnutrition Albumin 2.6 Encourage increasing oral protein calorie intake  Chronic diastolic CHF LVEF greater than 65% on January 26, 2019 Continue strict I's and O's and daily weight  Physical debility Physical therapy recommended home health PT Fall precautions  GERD Continue Protonix  GI cocktail as needed  Hyperlipidemia Continue statin    DVT prophylaxis:  Supratherapeutic INR on Coumadin Code Status: Full code Family Communication: None at bedside Disposition Plan:  Home when nephrology signs off and INR close to therapeutic level, currently supratherapeutic INR 3.8.    Consults called: General surgery by EDP, signed off on 03/26/2019, nephrology, pharmacy   Admission status: Inpatient   Objective: Vitals:   03/27/19 1437 03/27/19 2132 03/27/19 2225 03/28/19 0524  BP: 100/64 (!) 83/57 (!) 108/55 114/66  Pulse: 97 93 81 95  Resp: 16 19  18   Temp: (!) 97.5 F (36.4 C) 98.7 F (37.1 C)  98.5 F (36.9 C)  TempSrc: Oral Oral  Oral  SpO2: 100% 96%  97%  Weight:      Height:        Intake/Output Summary (Last 24 hours) at 03/28/2019 0857 Last data filed at 03/28/2019 0500 Gross per 24 hour  Intake 1495.77 ml  Output 200 ml  Net 1295.77 ml   Filed Weights   03/04/2019 1951 03/18/2019 0836  Weight: 103.9 kg 103.8 kg    Exam:  . General: 73 y.o. year-old female obese in no acute distress.  Alert and oriented x3.   . Cardiovascular: Irregular rate and rhythm with no rubs or gallops.  No JVD or thyromegaly noted.   Marland Kitchen Respiratory: Clear to auscultation with no wheezes or rales.  Good inspiratory effort.   . Abdomen: Soft nontender nondistended with normal bowel sounds x4 quadrants. . Musculoskeletal: Trace lower extremity edema.  2/4 pulses in all 4 extremities. . Skin: Left perineal region appears clean with surgical dressing in place. Marland Kitchen Psychiatry: Mood is appropriate for condition and setting   Data Reviewed: CBC: Recent Labs  Lab 03/03/2019 2120 03/13/2019 0543 03/27/19 0229  WBC 16.2* 14.1* 11.6*  NEUTROABS 13.2*  --  9.6*  HGB 11.9* 11.0* 10.8*  HCT 42.3 37.4 35.8*  MCV 94.4 93.0 90.9  PLT 148* 132* 92*   Basic Metabolic Panel: Recent Labs  Lab 02/28/2019 2157 03/11/2019 0543 03/27/19 0229 03/28/19 0224  NA 142 142 136 134*  K 5.2*  4.5 5.5* 5.2*  CL 103 107 102 97*  CO2 26 26 15* 20*  GLUCOSE 177* 120* 103* 195*  BUN 72* 75* 108* 120*  CREATININE 3.65* 4.19* 6.78* 8.06*  CALCIUM 8.4* 7.9* 7.4* 7.0*   GFR: Estimated Creatinine Clearance: 7.1 mL/min (A) (by C-G formula based on SCr of 8.06 mg/dL (H)). Liver Function Tests: Recent Labs  Lab 03/14/2019 2157 03/28/2019 0543  AST 57* 60*  ALT 62* 59*  ALKPHOS 71 70  BILITOT 1.8* 1.3*  PROT 5.2* 5.1*  ALBUMIN 2.7* 2.6*   No results for input(s): LIPASE, AMYLASE in the last 168 hours. No results for input(s): AMMONIA in the last 168 hours. Coagulation Profile: Recent Labs  Lab 03/01/2019 0543 03/25/19 0300 03/26/19 0455 03/27/19 0229 03/28/19 0224  INR 2.2* 2.6* 3.6* 3.7* 3.8*   Cardiac Enzymes: Recent Labs  Lab 03/10/2019 2204  TROPONINI 0.09*   BNP (last 3 results) No results for input(s): PROBNP in the last 8760 hours. HbA1C: No results for input(s): HGBA1C in the last 72 hours. CBG: Recent Labs  Lab 03/27/19 0727 03/27/19 1202 03/27/19 1603 03/27/19 2124 03/28/19 0748  GLUCAP 85 87 150* 166* 160*   Lipid Profile: No results for input(s): CHOL, HDL, LDLCALC, TRIG, CHOLHDL, LDLDIRECT in the last 72 hours.  Thyroid Function Tests: No results for input(s): TSH, T4TOTAL, FREET4, T3FREE, THYROIDAB in the last 72 hours. Anemia Panel: No results for input(s): VITAMINB12, FOLATE, FERRITIN, TIBC, IRON, RETICCTPCT in the last 72 hours. Urine analysis:    Component Value Date/Time   COLORURINE STRAW (A) 01/24/2019 1645   APPEARANCEUR CLEAR 01/24/2019 1645   LABSPEC 1.008 01/24/2019 1645   PHURINE 5.0 01/24/2019 1645   GLUCOSEU NEGATIVE 01/24/2019 1645   HGBUR NEGATIVE 01/24/2019 1645   BILIRUBINUR NEGATIVE 01/24/2019 1645   KETONESUR NEGATIVE 01/24/2019 1645   PROTEINUR 30 (A) 01/24/2019 1645   UROBILINOGEN 0.2 04/28/2010 0304   NITRITE NEGATIVE 01/24/2019 1645   LEUKOCYTESUR NEGATIVE 01/24/2019 1645   Sepsis Labs:  @[email or web address] (procalcitonin:4,lacticidven:4)  ) Recent Results (from the past 240 hour(s))  Blood culture (routine x 2)     Status: None (Preliminary result)   Collection Time: 03/22/2019 11:15 PM  Result Value Ref Range Status   Specimen Description BLOOD LEFT HAND  Final   Special Requests   Final    BOTTLES DRAWN AEROBIC AND ANAEROBIC Blood Culture results may not be optimal due to an inadequate volume of blood received in culture bottles   Culture   Final    NO GROWTH 3 DAYS Performed at [facility], [place] [address] [place], [place] [address]    Report Status PENDING  Incomplete  Blood culture (routine x 2)     Status: None (Preliminary result)   Collection Time: 03/21/2019 12:37 AM  Result Value Ref Range Status   Specimen Description BLOOD LEFT HAND  Final   Special Requests   Final    BOTTLES DRAWN AEROBIC AND ANAEROBIC Blood Culture adequate volume   Culture   Final    NO GROWTH 3 DAYS Performed at [facility], [place] [address], [place], [place] [address]    Report Status PENDING  Incomplete  Surgical pcr screen     Status: None   Collection Time: 03/08/2019  2:28 AM  Result Value Ref Range Status   MRSA, PCR NEGATIVE NEGATIVE Final   Staphylococcus aureus NEGATIVE NEGATIVE Final    Comment: (NOTE) The Xpert SA Assay (FDA approved for NASAL specimens in patients 63 years of age and older), is one component of a comprehensive surveillance program. It is not intended to diagnose infection nor to guide or monitor treatment. Performed at [facility], [place] [address] [place], [place] [address]   Aerobic/Anaerobic Culture (surgical/deep wound)     Status: None (Preliminary result)   Collection Time: 03/02/2019 10:05 AM  Result Value Ref Range Status   Specimen Description ABSCESS PERINEAL  Final   Special Requests NONE  Final   Gram Stain   Final    FEW WBC PRESENT,BOTH PMN AND MONONUCLEAR MODERATE GRAM POSITIVE COCCI IN PAIRS MODERATE GRAM VARIABLE ROD     Culture   Final    CULTURE REINCUBATED FOR BETTER GROWTH HOLDING FOR POSSIBLE ANAEROBE Performed at [facility], [place] [address], [place], [place] [address]    Report Status PENDING  Incomplete      Studies: No results found.  Scheduled Meds: . sodium chloride   Intravenous Once  . atorvastatin  20 mg Oral q1800  . brimonidine  1 drop Both Eyes BID   And  . timolol  1 drop Both Eyes BID  . calcitRIOL  0.25 mcg Oral Q M,W,F  . carvedilol  3.125 mg Oral BID WC  . insulin aspart  0-9 Units Subcutaneous  TID WC  . latanoprost  1 drop Both Eyes QHS  . ondansetron  4 mg Intravenous Once  . pantoprazole  40 mg Oral Daily  . predniSONE  5 mg Oral Daily  . vitamin B-12  1,000 mcg Oral Daily    Continuous Infusions: . sodium chloride    .  sodium bicarbonate  infusion 1000 mL 75 mL/hr at 03/28/19 0231     LOS: 4 days     Kayleen Memos, MD [facility] Pager [phone]  If 7PM-7AM, please contact night-coverage [email or web address] Password [facility] 03/28/2019, 8:57 AM

## 2019-03-28 NOTE — Progress Notes (Signed)
Dialysis Coordinator received call from MD informing DC that patient is starting HD and will need OP HD seat arranged. DC will initiate referral process and continue to follow until seat is arranged/discharge.  Melissa Bartlett [phone]

## 2019-03-28 NOTE — Procedures (Signed)
Central Venous Catheter Insertion Procedure Note Melissa Bartlett [id number] 12-02-1946  Procedure: Insertion of Central Venous Catheter Indications: Assessment of intravascular volume, Drug and/or fluid administration and Frequent blood sampling  Procedure Details Consent: Risks of procedure as well as the alternatives and risks of each were explained to the (patient/caregiver).  Consent for procedure obtained. Time Out: Verified patient identification, verified procedure, site/side was marked, verified correct patient position, special equipment/implants available, medications/allergies/relevent history reviewed, required imaging and test results available.  Performed Real time [place] used to  ID and cannulate  Maximum sterile technique was used including antiseptics, cap, gloves, gown, hand hygiene, mask and sheet. Skin prep: Chlorhexidine; local anesthetic administered A antimicrobial bonded/coated triple lumen catheter was placed in the left internal jugular vein using the Seldinger technique.  Evaluation Blood flow good Complications: No apparent complications Patient did tolerate procedure well. Chest X-ray ordered to verify placement.  CXR: pending.  Clementeen Graham 03/28/2019, 12:36 PM  Erick Colace ACNP-BC [place] Pager # [phone] OR # [phone] if no answer

## 2019-03-28 NOTE — Progress Notes (Signed)
[facility]    NEPHROLOGY PROGRESS NOTE  SUBJECTIVE: Went to evaluate patient this morning to discuss plan to initiate dialysis today.  She had just been seen by physical therapy, and was given Zofran for complaint of nausea and pain medication prior to my arrival.  When initially asked if she was feeling well, she stated no.  She nodded her head when asked if she was nauseous.  During my evaluation, she remained awake, but was not responsive to further questioning.  Respirations were shallow.  Alerted nursing.  Upon return to the room, she was less responsive, and we subsequently called the code.  She underwent CPR for several minutes with bag valve ventilations.  1 mg of epinephrine and 1 amp of sodium bicarbonate were administered.  She had return of spontaneous circulation.   OBJECTIVE:  Vitals:   03/28/19 1149 03/28/19 1158  BP:    Pulse: 62   Resp: 19   Temp:  (!) 97.5 F (36.4 C)  SpO2: 100%     Intake/Output Summary (Last 24 hours) at 03/28/2019 1220 Last data filed at 03/28/2019 0500 Gross per 24 hour  Intake 1495.77 ml  Output 200 ml  Net 1295.77 ml      [place]:  Lethargic, NAD HEENT: MMM [place] AT anicteric sclera Neck:  No JVD, no adenopathy CV:  Heart RRR  Lungs:  L/S CTA bilaterally Abd:  abd SNT/ND with normal BS GU:  Bladder non-palpable Extremities:  (+)1 LE edema. Skin:  No skin rash  MEDICATIONS:  . sodium chloride   Intravenous Once  . atorvastatin  20 mg Oral q1800  . brimonidine  1 drop Both Eyes BID   And  . timolol  1 drop Both Eyes BID  . calcitRIOL  0.25 mcg Oral Q M,W,F  . [START ON 04-21-19] carvedilol  3.125 mg Oral Daily  . [START ON 04/21/19] Chlorhexidine Gluconate Cloth  6 each Topical Q0600  . insulin aspart  0-9 Units Subcutaneous TID WC  . latanoprost  1 drop Both Eyes QHS  . ondansetron  4 mg Intravenous Once  . pantoprazole  40 mg Oral Daily  . predniSONE  5 mg Oral Daily  . vitamin B-12  1,000 mcg Oral Daily        LABS:   CBC Latest Ref Rng & Units 03/27/2019 03/19/2019 03/07/2019  WBC 4.0 - 10.5 K/uL 11.6(H) 14.1(H) 16.2(H)  Hemoglobin 12.0 - 15.0 g/dL 10.8(L) 11.0(L) 11.9(L)  Hematocrit 36.0 - 46.0 % 35.8(L) 37.4 42.3  Platelets 150 - 400 K/uL 92(L) 132(L) 148(L)    CMP Latest Ref Rng & Units 03/28/2019 03/27/2019 03/04/2019  Glucose 70 - 99 mg/dL 195(H) 103(H) 120(H)  BUN 8 - 23 mg/dL 120(H) 108(H) 75(H)  Creatinine 0.44 - 1.00 mg/dL 8.06(H) 6.78(H) 4.19(H)  Sodium 135 - 145 mmol/L 134(L) 136 142  Potassium 3.5 - 5.1 mmol/L 5.2(H) 5.5(H) 4.5  Chloride 98 - 111 mmol/L 97(L) 102 107  CO2 22 - 32 mmol/L 20(L) 15(L) 26  Calcium 8.9 - 10.3 mg/dL 7.0(L) 7.4(L) 7.9(L)  Total Protein 6.5 - 8.1 g/dL - - 5.1(L)  Total Bilirubin 0.3 - 1.2 mg/dL - - 1.3(H)  Alkaline Phos 38 - 126 U/L - - 70  AST 15 - 41 U/L - - 60(H)  ALT 0 - 44 U/L - - 59(H)    Lab Results  Component Value Date   PTH 80 (H) 03/09/2019   PTH Comment 03/09/2019   CALCIUM 7.0 (L) 03/28/2019   PHOS 4.2  01/30/2019       Component Value Date/Time   COLORURINE STRAW (A) 01/24/2019 1645   APPEARANCEUR CLEAR 01/24/2019 1645   LABSPEC 1.008 01/24/2019 1645   PHURINE 5.0 01/24/2019 [place] 01/24/2019 1645   HGBUR NEGATIVE 01/24/2019 1645   BILIRUBINUR NEGATIVE 01/24/2019 [place] 01/24/2019 1645   PROTEINUR 30 (A) 01/24/2019 1645   UROBILINOGEN 0.2 04/28/2010 0304   NITRITE NEGATIVE 01/24/2019 1645   LEUKOCYTESUR NEGATIVE 01/24/2019 1645   No results found for: PHART, PCO2ART, PO2ART, HCO3, TCO2, ACIDBASEDEF, O2SAT     Component Value Date/Time   IRON 53 03/09/2019 1418   TIBC 316 03/09/2019 1418   FERRITIN 81 03/09/2019 1418   IRONPCTSAT 17 03/09/2019 1418       ASSESSMENT/PLAN:    The patient is a 73 y.o. year old female patient with a past medical history significant for congestive heart failure with preserved ejection fraction, chronic kidney disease stage IV with a baseline serum  creatinine around 3.5-4.0, diabetes, hypertension, morbid obesity, obstructive sleep apnea, paroxysmal atrial fibrillation, and rheumatoid arthritis who presented with chief complaint of pain in the perineum that started the day before presentation.  She was noted to have an associated fever and elevated white count.  A CT of the pelvis revealed an abscess in the left perineum.  General surgery was consulted and she underwent and a I&D on 02/27/2019.  She was notably hypotensive postoperatively.   1.  Chronic kidney disease stage IV.  Her baseline serum creatinine runs between 3.5 and 4.  She has a mature right upper extremity AV fistula.  Chronic kidney disease likely in the basis of longstanding hypertension and diabetes.  2.  Acute kidney injury.  Likely on the basis of decreased renal perfusion in the setting of low blood pressure.  Will check a urinalysis.  Does not appear to be fluid overloaded.  Minimal urine output noted.    Has not responded to IV fluids.  Will plan dialysis today.  3.  Hyperkalemia.    Improved with bicarb.  Will improve further with dialysis today.  4.  Anion gap metabolic acidosis.  Likely secondary to progressive renal dysfunction.  Improving.  Should improve again with dialysis today.  5.  Status post PEA arrest.  Likely respiratory event in the setting of pain medication.    [place], DO, [place]

## 2019-03-28 NOTE — Consult Note (Signed)
Cardiology Consultation:   Melissa Bartlett: MATHILDE MCWHERTER MRN: [id number]; DOB: [birth year]  Admit date: 03/22/2019 Date of Consult: 03/28/2019  Primary Care Provider: Donald Prose, MD Primary Cardiologist: Candee Furbish, MD  Primary Electrophysiologist:  None    Melissa Profile:   Melissa Bartlett is a 73 y.o. female with a hx of permanent atrial fibrillation, CKD stage IV, GERD, hypertension, morbid obesity, obstructive sleep apnea on CPAP, and history of heart murmur who is being seen today for the evaluation of PEA arrest at the request of Dr. Nevada Crane.  History of Present Illness:   Melissa Bartlett is a 73 year old female with past medical history of permanent atrial fibrillation, CKD stage IV, GERD, hypertension, morbid obesity, obstructive sleep apnea on CPAP and a history of heart murmur.  Previous cardiac catheterization performed after a abnormal Myoview by Dr. Martinique on 11/16/2017 showed normal coronary arteries, elevated LVEDP.  Her last follow-up with Dr. Marlou Porch was on 11/15/2018, she was doing well at the time.  She takes Xarelto 15 mg daily due to her renal function.  She underwent superficialization of right brachiocephalic AV fistula on 09/04/6733.  She was admitted in late January for acute on chronic diastolic heart failure.  Echocardiogram obtained on 01/26/2019 showed EF greater than 65% moderate calcification of the aortic valve, myxomatous mitral valve, otherwise no significant valvular disease or pericardial effusion.  She was eventually discharged on 80 mg twice daily of Lasix.  Melissa was readmitted on 03/23/2023 nausea, vomiting and diarrhea.  She was in atrial fibrillation with RVR on arrival.  Heart rate improved with IV fluid hydration.  Troponin was borderline elevated at 0.09.  White blood cell count 16.  Melissa reported fever with T-max of 100.7.  Due to presence of perineal abscess on CT, general surgery was consulted and she was ruled in for sepsis given leukocytosis and the  tachycardia.  She underwent incision and drainage of perineal abscess along with excision of perianal and right labia condyloma on 3/26.  She had postoperative hypotension, antihypertensive medication including carvedilol and diltiazem were held.  Pressors were stopped on 3/27, and carvedilol was restarted to 3.125 mg twice daily.  This morning, Melissa was seen by physical therapist around 1030, at which time she was still very nauseous.  She tolerated a short distance of walking inside the room only.  Around 10:50 AM, Melissa was found unresponsive and developed PEA arrest.  [facility] was subsequently called, CPR was performed for 5 minutes and Melissa received epinephrine and a sodium bicarb, before defibrillation was performed, she achieved ROSC.  Blood pressure was low in the 40s.  She was subsequently started on Levophed. K was 5.2 this morning.    Past Medical History:  Diagnosis Date  . Anemia   . CHF (congestive heart failure) ([place])   . CKD (chronic kidney disease), stage IV ([place])   . Complication of anesthesia    " I aspirated during my gallbladder surgery"  . Diabetes ([place])   . GERD (gastroesophageal reflux disease)   . Heart murmur   . HTN (hypertension)   . Morbid obesity with BMI of 45.0-49.9, adult ([place])   . OSA (obstructive sleep apnea)    with AHI 11/hr with nocturnal hypoxemia , uses cpap  . PAF (paroxysmal atrial fibrillation) ([place])    s/p DCCV in 8/17  . Pneumonia   . RA (rheumatoid arthritis) ([place])   . Wears glasses   . Wears partial dentures     Past Surgical History:  Procedure  Laterality Date  . ABDOMINAL HYSTERECTOMY    . ANKLE FUSION Right   . AV FISTULA PLACEMENT Right 07/14/2018   Procedure: ARTERIOVENOUS (AV) FISTULA CREATION BRACHIOCEPHALIC;  Surgeon: Elam Dutch, MD;  Location: [place];  Service: Vascular;  Laterality: Right;  . BREAST BIOPSY    . CARDIOVERSION N/A 08/15/2016   Procedure: CARDIOVERSION;  Surgeon: Jerline Pain, MD;  Location: [facility]  ENDOSCOPY;  Service: Cardiovascular;  Laterality: N/A;  . CHOLECYSTECTOMY    . COLONOSCOPY W/ BIOPSIES AND POLYPECTOMY    . CONDYLOMA EXCISION/FULGURATION N/A 03/03/2019   Procedure: Condyloma Removal;  Surgeon: Ileana Roup, MD;  Location: [place];  Service: General;  Laterality: N/A;  . DILATION AND CURETTAGE OF UTERUS    . FISTULA SUPERFICIALIZATION Right 01/03/2019   Procedure: SUPERFICIALIZATION OF RIGHT ARM FISTULA;  Surgeon: Elam Dutch, MD;  Location: [place];  Service: Vascular;  Laterality: Right;  . GALLBLADDER SURGERY    . HYSTEROTOMY     Patrtial  . HYSTEROTOMY     Complete  . INCISION AND DRAINAGE PERIRECTAL ABSCESS N/A 03/08/2019   Procedure: IRRIGATION AND DEBRIDEMENT PERIANAL ABSCESS;  Surgeon: Ileana Roup, MD;  Location: [place];  Service: General;  Laterality: N/A;  . KNEE ARTHROSCOPY    . LEFT HEART CATH AND CORONARY ANGIOGRAPHY N/A 11/16/2017   Procedure: LEFT HEART CATH AND CORONARY ANGIOGRAPHY;  Surgeon: Martinique, Peter M, MD;  Location: [place] CV LAB;  Service: Cardiovascular;  Laterality: N/A;  . MULTIPLE TOOTH EXTRACTIONS    . RECTAL EXAM UNDER ANESTHESIA N/A 03/28/2019   Procedure: Rectal Exam Under Anesthesia;  Surgeon: Ileana Roup, MD;  Location: [place];  Service: General;  Laterality: N/A;  . TUBAL LIGATION       Home Medications:  Prior to Admission medications   Medication Sig Start Date End Date Taking? Authorizing Provider  atorvastatin (LIPITOR) 20 MG tablet Take 20 mg by mouth daily.   Yes [provider]  brimonidine-timolol (COMBIGAN) 0.2-0.5 % ophthalmic solution Place 1 drop into both eyes 2 (two) times daily.   Yes [provider]  calcitRIOL (ROCALTROL) 0.25 MCG capsule Take 0.25 mcg by mouth. Take 1 capsule every Monday, Wednesday, and Friday   Yes [provider]  carvedilol (COREG) 25 MG tablet TAKE 1 TABLET BY MOUTH TWICE A DAY WITH A MEAL Melissa taking differently: Take 25 mg by mouth 2  (two) times daily with a meal. TAKE 1 TABLET BY MOUTH TWICE A DAY WITH A MEAL 06/21/18  Yes Jerline Pain, MD  Darbepoetin Alfa (ARANESP) 60 MCG/0.3ML SOSY injection Inject 60 mcg every 30 (thirty) days into the skin.   Yes [provider]  diltiazem (CARDIZEM CD) 120 MG 24 hr capsule Take 120 mg by mouth daily.   Yes [provider]  diphenhydramine-acetaminophen (TYLENOL PM) 25-500 MG TABS tablet Take 1 tablet by mouth at bedtime.    Yes [provider]  febuxostat (ULORIC) 40 MG tablet Take 40 mg by mouth daily. Pt will check with her provider on when to start this medication   Yes [provider]  furosemide (LASIX) 20 MG tablet Take 80 mg by mouth 2 (two) times daily.    Yes [provider]  hydrALAZINE (APRESOLINE) 50 MG tablet Take 50 mg by mouth 3 (three) times daily.    Yes [provider]  KLOR-CON M20 20 MEQ tablet TAKE 1 TABLET BY MOUTH EVERY DAY Melissa taking differently: Take 20 mEq by mouth  daily.  02/17/19  Yes Jerline Pain, MD  latanoprost (XALATAN) 0.005 % ophthalmic solution Place 1 drop into both eyes at bedtime. 01/19/19  Yes [provider]  leflunomide (ARAVA) 10 MG tablet Take 10 mg by mouth daily.    Yes [provider]  Magnesium Oxide 400 (240 Mg) MG TABS Take 400 mg by mouth 2 (two) times daily. 05/27/18  Yes [provider]  pantoprazole (PROTONIX) 40 MG tablet Take 1 tablet (40 mg total) by mouth daily. 10/29/17  Yes Vann, Jessica U, DO  predniSONE (DELTASONE) 5 MG tablet Take 5 mg by mouth daily.  08/22/17  Yes [provider]  [place] into the lungs at bedtime. CPAP   Yes [provider]  Probiotic Product (ALIGN PO) Take 1 tablet by mouth at bedtime.    Yes [provider]  vitamin B-12 (CYANOCOBALAMIN) 1000 MCG tablet Take 1,000 mcg by mouth daily.   Yes [provider]  warfarin (COUMADIN) 5 MG tablet Take as directed by Coumadin  Clinic Melissa taking differently: Take 2.5 mg by mouth daily. Take as directed by Coumadin Clinic 03/04/19  Yes Jerline Pain, MD    Inpatient Medications: Scheduled Meds: . sodium chloride   Intravenous Once  . atorvastatin  20 mg Oral q1800  . brimonidine  1 drop Both Eyes BID   And  . timolol  1 drop Both Eyes BID  . calcitRIOL  0.25 mcg Oral Q M,W,F  . [START ON Apr 07, 2019] Chlorhexidine Gluconate Cloth  6 each Topical Q0600  . insulin aspart  0-9 Units Subcutaneous TID WC  . latanoprost  1 drop Both Eyes QHS  . ondansetron  4 mg Intravenous Once  . pantoprazole  40 mg Oral Daily  . predniSONE  5 mg Oral Daily  . vitamin B-12  1,000 mcg Oral Daily   Continuous Infusions: . sodium chloride    . sodium chloride    . norepinephrine (LEVOPHED) Adult infusion    .  sodium bicarbonate  infusion 1000 mL 75 mL/hr at 03/28/19 0231   PRN Meds: sodium chloride, alteplase, heparin, lidocaine (PF), lidocaine-prilocaine, ondansetron (ZOFRAN) IV, pentafluoroprop-tetrafluoroeth, phenol, simethicone  Allergies:    Allergies  Allergen Reactions  . Enalapril Other (See Comments)    Systemic reaction - jerking  . Codeine Itching    Social History:   Social History   Socioeconomic History  . Marital status: Married    Spouse name: Not on file  . Number of children: Not on file  . Years of education: Not on file  . Highest education level: Not on file  Occupational History  . Not on file  Social Needs  . Financial resource strain: Not on file  . Food insecurity:    Worry: Not on file    Inability: Not on file  . Transportation needs:    Medical: Not on file    Non-medical: Not on file  Tobacco Use  . Smoking status: Former Smoker    Types: Cigarettes    Last attempt to quit: 04/08/2016    Years since quitting: 2.9  . Smokeless tobacco: Never Used  Substance and Sexual Activity  . Alcohol use: No    Alcohol/week: 0.0 standard drinks  . Drug use: No  . Sexual activity: Not  on file  Lifestyle  . Physical activity:    Days per week: Not on file    Minutes per session: Not on file  . Stress: Not on file  Relationships  . Social connections:    Talks on phone: Not on file    Gets together: Not on file    Attends religious service: Not on file    Active member of club or organization: Not on file    Attends meetings of clubs or organizations: Not on file    Relationship status: Not on file  . Intimate partner violence:    Fear of current or ex partner: Not on file    Emotionally abused: Not on file    Physically abused: Not on file    Forced sexual activity: Not on file  Other Topics Concern  . Not on file  Social History Narrative  . Not on file    Family History:    Family History  Problem Relation Age of Onset  . Hypertension Mother        Family HX Diabetes  . Stomach cancer Father      ROS:  Please see the history of present illness.   All other ROS reviewed and negative.     Physical Exam/Data:   Vitals:   03/28/19 0524 03/28/19 1110 03/28/19 1149 03/28/19 1158  BP: 114/66 97/85    Pulse: 95 76 62   Resp: 18  19   Temp: 98.5 F (36.9 C)   (!) 97.5 F (36.4 C)  TempSrc: Oral   Oral  SpO2: 97% 96% 100%   Weight:    112 kg  Height:    5\' 2"  (1.575 m)    Intake/Output Summary (Last 24 hours) at 03/28/2019 1236 Last data filed at 03/28/2019 0500 Gross per 24 hour  Intake 1495.77 ml  Output 200 ml  Net 1295.77 ml   Last 3 Weights 03/28/2019 03/28/2019 03/05/2019  Weight (lbs) 246 lb 14.6 oz 228 lb 13.4 oz 229 lb  Weight (kg) 112 kg 103.8 kg 103.874 kg     Body mass index is 45.16 kg/m.  General:  Morbidly obese, on BiPAP HEENT: normal Lymph: no adenopathy Neck: no JVD Endocrine:  No thryomegaly Vascular: No carotid bruits; FA pulses 2+ bilaterally without bruits  Cardiac:  Irregularly irregular; systolic murmur present  Lungs:  clear to auscultation bilaterally, no wheezing, rhonchi or rales  Abd: soft, nontender, no  hepatomegaly  Ext: no edema Musculoskeletal:  No deformities, BUE and BLE strength normal and equal Skin: warm and dry  Neuro:  CNs 2-12 intact, no focal abnormalities noted Psych:  Normal affect   EKG:  The EKG was personally reviewed and demonstrates:  Atrial fibrillation with RVR, no ischemic changes, normal QTc Telemetry:  Telemetry was personally reviewed and demonstrates: atrial fibrillation with mild RVR. The telemetry strips from 6N are no longer available for review and were not printed. According to the nurse, the rhythm never changed. Relevant CV Studies:  Cath 14/43/1540  LV end diastolic pressure is mildly elevated.   1. Normal coronary angiography 2. Elevated LVEDP  Plan: medical therapy.   Echo 01/26/2019 IMPRESSIONS    1. The left ventricle appears to be normal in size, has mild wall thickness >65% ejection fraction.  2. Right ventricular systolic pressure is is normal.  3. The right ventricle has normal size and normal systolic function.  4. There is moderate calcification of the aortic valve.  5. There is moderate thickening of the aortic valve. No aortic stenosis  6. Aortic valve regurgitation is mild by color flow Doppler.  7. The mitral valve is myxomatous.    Laboratory Data:  Chemistry Recent Labs  Lab 02/28/2019 0543 03/27/19 0229 03/28/19 0224  NA 142 136 134*  K 4.5 5.5* 5.2*  CL 107 102 97*  CO2 26 15* 20*  GLUCOSE 120* 103* 195*  BUN 75* 108* 120*  CREATININE 4.19* 6.78* 8.06*  CALCIUM 7.9* 7.4* 7.0*  GFRNONAA 10* 6* 5*  GFRAA 12* 6* 5*  ANIONGAP 9 19* 17*    Recent Labs  Lab 03/12/2019 2157 03/25/2019 0543  PROT 5.2* 5.1*  ALBUMIN 2.7* 2.6*  AST 57* 60*  ALT 62* 59*  ALKPHOS 71 70  BILITOT 1.8* 1.3*   Hematology Recent Labs  Lab 03/09/2019 2120 03/19/2019 0543 03/27/19 0229  WBC 16.2* 14.1* 11.6*  RBC 4.48 4.02 3.94  HGB 11.9* 11.0* 10.8*  HCT 42.3 37.4 35.8*  MCV 94.4 93.0 90.9  MCH 26.6 27.4 27.4  MCHC 28.1* 29.4*  30.2  RDW 16.2* 16.2* 16.0*  PLT 148* 132* 92*   Cardiac Enzymes Recent Labs  Lab 02/27/2019 2204  TROPONINI 0.09*   No results for input(s): TROPIPOC in the last 168 hours.  BNPNo results for input(s): BNP, PROBNP in the last 168 hours.  DDimer No results for input(s): DDIMER in the last 168 hours.  Radiology/Studies:  Dg Chest Port 1 View  Result Date: 03/28/2019 CLINICAL DATA:  Shortness of breath. EXAM: PORTABLE CHEST 1 VIEW COMPARISON:  01/29/2019 FINDINGS: The cardiac silhouette remains enlarged. Small volume loculated fluid in the right minor fissure is stable to slightly increased. There is minimal atelectasis in the left lung base. The interstitial markings are slightly prominent without overt edema. No pneumothorax is identified. No acute osseous abnormality is seen. IMPRESSION: Cardiomegaly with persistent small volume pleural fluid in the right minor fissure and minimal left basilar atelectasis. Electronically Signed   By: Logan Bores M.D.   On: 03/25/2019 13:54    Assessment and Plan:   1. PEA arrest  - no arrhythmia detected  - normal coronaries on cath in 2018; no ischemic ECG changes, normal regional wall motion on preliminary review of echo  - recently underwent perineal abscess incision and drainage. Postop complicated by hypotension requiring brief pressors, pressor stopped on 3/27, restarted today after CPR  - K 5.2 this morning, repeat labs from 1300h still pending.  - Considered uremic pericarditis and tamponade in a Melissa with uremic-level labs and advanced CKD, but there is no significant effusion on echo.  - bedside echocardiogram review shows preserved LV function with a dilated and depressed right ventricle, elevated RV systolic pressure and a suggestion of McConnell's sign (considered pathognomonic for acute cor pulmonale, usually acute pulmonary embolism). Fully anticoagulated throughout this hospital stay and the preceding month: pulmonary embolism would  appear unlikely. Acute hypoxia can produce a similar pattern.  - It seems unlikely that PEA cause is cardiac, but more likely related to respiratory and electrolyte issue in the setting of worsening renal function, consider recurrent sepsis.   2. Sepsis: related to #3. Briefly on pressors. Hold off restarting the carvedilol.  3. Perineal abscess: underwent incision and drainage  4. Acute on chronic renal insufficiency: baseline Cr around 2.7-3.5. Cr trended up to >8 due to hypotension after recent surgery. Nephrology consulted for worsening renal function and oliguria. Started on IVF and bicarb  5. Permanent atrial fibrillation with RVR: home diltiazem stopped, home coreg reduced to 3.125mg  BID due to hypotension requiring pressors  6. Morbid obesity    7. OSA on CPAP: some degree of the right heart changes may be chronic, although not seen on  the echo from January.  8. Acute hypoxic respiratory failure: currently on BiPAP      For questions or updates, please contact [place] Please consult [email or web address] for contact info under

## 2019-03-29 ENCOUNTER — Inpatient Hospital Stay (HOSPITAL_COMMUNITY): Payer: Medicare Other

## 2019-03-29 LAB — GLUCOSE, CAPILLARY
Glucose-Capillary: 106 mg/dL — ABNORMAL HIGH (ref 70–99)
Glucose-Capillary: 90 mg/dL (ref 70–99)
Glucose-Capillary: 118 mg/dL — ABNORMAL HIGH (ref 70–99)
Glucose-Capillary: 223 mg/dL — ABNORMAL HIGH (ref 70–99)
Glucose-Capillary: 102 mg/dL — ABNORMAL HIGH (ref 70–99)
Glucose-Capillary: 158 mg/dL — ABNORMAL HIGH (ref 70–99)
Glucose-Capillary: 115 mg/dL — ABNORMAL HIGH (ref 70–99)

## 2019-03-29 LAB — CBC
HCT: 33.7 % — ABNORMAL LOW (ref 36.0–46.0)
HCT: 34.5 % — ABNORMAL LOW (ref 36.0–46.0)
Hemoglobin: 10 g/dL — ABNORMAL LOW (ref 12.0–15.0)
Hemoglobin: 10.4 g/dL — ABNORMAL LOW (ref 12.0–15.0)
MCH: 26.2 pg (ref 26.0–34.0)
MCH: 26.6 pg (ref 26.0–34.0)
MCHC: 29.7 g/dL — ABNORMAL LOW (ref 30.0–36.0)
MCHC: 30.1 g/dL (ref 30.0–36.0)
MCV: 88.5 fL (ref 80.0–100.0)
MCV: 88.2 fL (ref 80.0–100.0)
PLATELETS: 78 10*3/uL — AB (ref 150–400)
PLATELETS: 71 10*3/uL — AB (ref 150–400)
RBC: 3.81 MIL/uL — ABNORMAL LOW (ref 3.87–5.11)
RBC: 3.91 MIL/uL (ref 3.87–5.11)
RDW: 15.9 % — ABNORMAL HIGH (ref 11.5–15.5)
RDW: 15.9 % — ABNORMAL HIGH (ref 11.5–15.5)
WBC: 10.5 10*3/uL (ref 4.0–10.5)
WBC: 12.9 10*3/uL — ABNORMAL HIGH (ref 4.0–10.5)
nRBC: 1.2 % — ABNORMAL HIGH (ref 0.0–0.2)
nRBC: 3.2 % — ABNORMAL HIGH (ref 0.0–0.2)

## 2019-03-29 LAB — HEPATIC FUNCTION PANEL
ALT: 344 U/L — ABNORMAL HIGH (ref 0–44)
AST: 232 U/L — ABNORMAL HIGH (ref 15–41)
Albumin: 2.3 g/dL — ABNORMAL LOW (ref 3.5–5.0)
Alkaline Phosphatase: 134 U/L — ABNORMAL HIGH (ref 38–126)
BILIRUBIN TOTAL: 1.6 mg/dL — AB (ref 0.3–1.2)
Bilirubin, Direct: 0.8 mg/dL — ABNORMAL HIGH (ref 0.0–0.2)
Indirect Bilirubin: 0.8 mg/dL (ref 0.3–0.9)
Total Protein: 4.6 g/dL — ABNORMAL LOW (ref 6.5–8.1)

## 2019-03-29 LAB — CULTURE, BLOOD (ROUTINE X 2)
Culture: NO GROWTH
Culture: NO GROWTH
Special Requests: ADEQUATE

## 2019-03-29 LAB — HEPATITIS B SURFACE ANTIBODY,QUALITATIVE: Hep B S Ab: NONREACTIVE

## 2019-03-29 LAB — IRON AND TIBC
Iron: 40 ug/dL (ref 28–170)
Saturation Ratios: 16 % (ref 10.4–31.8)
TIBC: 246 ug/dL — ABNORMAL LOW (ref 250–450)
UIBC: 206 ug/dL

## 2019-03-29 LAB — PROTIME-INR
INR: 2.8 — ABNORMAL HIGH (ref 0.8–1.2)
INR: 3.3 — ABNORMAL HIGH (ref 0.8–1.2)
Prothrombin Time: 29.4 seconds — ABNORMAL HIGH (ref 11.4–15.2)
Prothrombin Time: 33.2 seconds — ABNORMAL HIGH (ref 11.4–15.2)

## 2019-03-29 LAB — POCT I-STAT 7, (LYTES, BLD GAS, ICA,H+H)
Acid-base deficit: 4 mmol/L — ABNORMAL HIGH (ref 0.0–2.0)
Acid-base deficit: 10 mmol/L — ABNORMAL HIGH (ref 0.0–2.0)
Acid-base deficit: 8 mmol/L — ABNORMAL HIGH (ref 0.0–2.0)
Bicarbonate: 24.6 mmol/L (ref 20.0–28.0)
Bicarbonate: 22 mmol/L (ref 20.0–28.0)
Bicarbonate: 21 mmol/L (ref 20.0–28.0)
Calcium, Ion: 0.82 mmol/L — CL (ref 1.15–1.40)
Calcium, Ion: 0.83 mmol/L — CL (ref 1.15–1.40)
Calcium, Ion: 0.82 mmol/L — CL (ref 1.15–1.40)
HCT: 35 % — ABNORMAL LOW (ref 36.0–46.0)
HCT: 36 % (ref 36.0–46.0)
HCT: 33 % — ABNORMAL LOW (ref 36.0–46.0)
HEMOGLOBIN: 12.2 g/dL (ref 12.0–15.0)
Hemoglobin: 11.9 g/dL — ABNORMAL LOW (ref 12.0–15.0)
Hemoglobin: 11.2 g/dL — ABNORMAL LOW (ref 12.0–15.0)
O2 Saturation: 84 %
O2 Saturation: 80 %
O2 Saturation: 83 %
PO2 ART: 60 mmHg — AB (ref 83.0–108.0)
POTASSIUM: 3.9 mmol/L (ref 3.5–5.1)
Patient temperature: 98.5
Patient temperature: 98.5
Potassium: 4 mmol/L (ref 3.5–5.1)
Potassium: 3.6 mmol/L (ref 3.5–5.1)
Sodium: 137 mmol/L (ref 135–145)
Sodium: 139 mmol/L (ref 135–145)
Sodium: 137 mmol/L (ref 135–145)
TCO2: 26 mmol/L (ref 22–32)
TCO2: 24 mmol/L (ref 22–32)
TCO2: 23 mmol/L (ref 22–32)
pCO2 arterial: 60.8 mmHg — ABNORMAL HIGH (ref 32.0–48.0)
pCO2 arterial: 78.1 mmHg (ref 32.0–48.0)
pCO2 arterial: 56.3 mmHg — ABNORMAL HIGH (ref 32.0–48.0)
pH, Arterial: 7.215 — ABNORMAL LOW (ref 7.350–7.450)
pH, Arterial: 7.057 — CL (ref 7.350–7.450)
pH, Arterial: 7.179 — CL (ref 7.350–7.450)
pO2, Arterial: 59 mmHg — ABNORMAL LOW (ref 83.0–108.0)
pO2, Arterial: 64 mmHg — ABNORMAL LOW (ref 83.0–108.0)

## 2019-03-29 LAB — TROPONIN I
Troponin I: 0.75 ng/mL (ref <0.03)
Troponin I: 0.52 ng/mL (ref <0.03)

## 2019-03-29 LAB — BASIC METABOLIC PANEL
Anion gap: 17 — ABNORMAL HIGH (ref 5–15)
BUN: 108 mg/dL — ABNORMAL HIGH (ref 8–23)
CALCIUM: 7.5 mg/dL — AB (ref 8.9–10.3)
CO2: 26 mmol/L (ref 22–32)
Chloride: 96 mmol/L — ABNORMAL LOW (ref 98–111)
Creatinine, Ser: 7.72 mg/dL — ABNORMAL HIGH (ref 0.44–1.00)
GFR calc Af Amer: 6 mL/min — ABNORMAL LOW (ref >60)
GFR calc non Af Amer: 5 mL/min — ABNORMAL LOW (ref >60)
Glucose, Bld: 117 mg/dL — ABNORMAL HIGH (ref 70–99)
Potassium: 4.7 mmol/L (ref 3.5–5.1)
Sodium: 139 mmol/L (ref 135–145)

## 2019-03-29 LAB — MAGNESIUM: Magnesium: 1.9 mg/dL (ref 1.7–2.4)

## 2019-03-29 LAB — LACTIC ACID, PLASMA: LACTIC ACID, VENOUS: 7.7 mmol/L — AB (ref 0.5–1.9)

## 2019-03-29 LAB — HEMOGLOBIN A1C
Hgb A1c MFr Bld: 7.2 % — ABNORMAL HIGH (ref 4.8–5.6)
Mean Plasma Glucose: 159.94 mg/dL

## 2019-03-29 LAB — COMPREHENSIVE METABOLIC PANEL
ALT: 345 U/L — ABNORMAL HIGH (ref 0–44)
AST: 230 U/L — ABNORMAL HIGH (ref 15–41)
Albumin: 2.4 g/dL — ABNORMAL LOW (ref 3.5–5.0)
Alkaline Phosphatase: 134 U/L — ABNORMAL HIGH (ref 38–126)
Anion gap: UNDETERMINED (ref 5–15)
BUN: >100 mg/dL — ABNORMAL HIGH (ref 8–23)
CHLORIDE: 94 mmol/L — AB (ref 98–111)
CO2: 28 mmol/L (ref 22–32)
Calcium: 6.6 mg/dL — ABNORMAL LOW (ref 8.9–10.3)
Creatinine, Ser: 7.91 mg/dL — ABNORMAL HIGH (ref 0.44–1.00)
GFR calc Af Amer: 5 mL/min — ABNORMAL LOW (ref >60)
GFR calc non Af Amer: 5 mL/min — ABNORMAL LOW (ref >60)
Glucose, Bld: 483 mg/dL — ABNORMAL HIGH (ref 70–99)
Potassium: 5.5 mmol/L — ABNORMAL HIGH (ref 3.5–5.1)
Sodium: 143 mmol/L (ref 135–145)
Total Bilirubin: 1.9 mg/dL — ABNORMAL HIGH (ref 0.3–1.2)
Total Protein: 4.7 g/dL — ABNORMAL LOW (ref 6.5–8.1)

## 2019-03-29 LAB — AEROBIC/ANAEROBIC CULTURE W GRAM STAIN (SURGICAL/DEEP WOUND)

## 2019-03-29 LAB — AEROBIC/ANAEROBIC CULTURE (SURGICAL/DEEP WOUND)

## 2019-03-29 LAB — SAVE SMEAR(SSMR), FOR PROVIDER SLIDE REVIEW

## 2019-03-29 LAB — HEPATITIS B SURFACE ANTIGEN: Hepatitis B Surface Ag: NEGATIVE

## 2019-03-29 MED ORDER — FENTANYL BOLUS VIA INFUSION
25.0000 ug | INTRAVENOUS | Status: DC | PRN
  Filled 2019-03-29: qty 25

## 2019-03-29 MED ORDER — WARFARIN - PHARMACIST DOSING INPATIENT: Freq: Every day | Status: DC

## 2019-03-29 MED ORDER — DOCUSATE SODIUM 50 MG/5ML PO LIQD: 100.0000 mg | Freq: Two times a day (BID) | ORAL | Status: DC | PRN

## 2019-03-29 MED ORDER — FENTANYL CITRATE (PF) 100 MCG/2ML IJ SOLN
50.0000 ug | INTRAMUSCULAR | Status: DC | PRN
  Administered 2019-03-29: 50 ug via INTRAVENOUS
  Filled 2019-03-29: qty 2

## 2019-03-29 MED ORDER — SODIUM CHLORIDE 0.9 % IV BOLUS
500.0000 mL | Freq: Once | INTRAVENOUS | Status: AC
  Administered 2019-03-29: 500 mL via INTRAVENOUS

## 2019-03-29 MED ORDER — METHYLPREDNISOLONE SODIUM SUCC 125 MG IJ SOLR
60.0000 mg | Freq: Once | INTRAMUSCULAR | Status: AC
  Administered 2019-03-29: 60 mg via INTRAVENOUS
  Filled 2019-03-29: qty 2

## 2019-03-29 MED ORDER — ACETAMINOPHEN 325 MG PO TABS
650.0000 mg | ORAL_TABLET | Freq: Four times a day (QID) | ORAL | Status: DC | PRN
  Administered 2019-03-29: 650 mg via ORAL
  Filled 2019-03-29: qty 2

## 2019-03-29 MED ORDER — EPINEPHRINE PF 1 MG/ML IJ SOLN
0.5000 ug/min | INTRAVENOUS | Status: DC
  Administered 2019-03-29: 3 ug/min via INTRAVENOUS
  Filled 2019-03-29: qty 4

## 2019-03-29 MED ORDER — ATORVASTATIN CALCIUM 10 MG PO TABS
20.0000 mg | ORAL_TABLET | Freq: Every day | ORAL | Status: DC
  Administered 2019-03-29: 20 mg
  Filled 2019-03-29: qty 2

## 2019-03-29 MED ORDER — CHLORHEXIDINE GLUCONATE 0.12% ORAL RINSE (MEDLINE KIT): 15.0000 mL | Freq: Two times a day (BID) | OROMUCOSAL | Status: DC

## 2019-03-29 MED ORDER — ORAL CARE MOUTH RINSE
15.0000 mL | OROMUCOSAL | Status: DC
  Administered 2019-03-29: 15 mL via OROMUCOSAL

## 2019-03-29 MED ORDER — BISACODYL 10 MG RE SUPP: 10.0000 mg | Freq: Every day | RECTAL | Status: DC | PRN

## 2019-03-29 MED ORDER — FENTANYL 2500MCG IN NS 250ML (10MCG/ML) PREMIX INFUSION
25.0000 ug/h | INTRAVENOUS | Status: DC
  Administered 2019-03-29: 50 ug/h via INTRAVENOUS
  Filled 2019-03-29: qty 250

## 2019-03-29 MED ORDER — DOPAMINE-DEXTROSE 3.2-5 MG/ML-% IV SOLN: 0.0000 ug/kg/min | INTRAVENOUS | Status: DC

## 2019-03-29 MED ORDER — NOREPINEPHRINE 4 MG/250ML-% IV SOLN
0.0000 ug/min | INTRAVENOUS | Status: DC
  Administered 2019-03-29: 30 ug/min via INTRAVENOUS
  Administered 2019-03-29: 40 ug/min via INTRAVENOUS
  Filled 2019-03-29 (×2): qty 250

## 2019-03-29 MED ORDER — ALTEPLASE 2 MG IJ SOLR
2.0000 mg | Freq: Once | INTRAMUSCULAR | Status: AC
  Administered 2019-03-29: 2 mg

## 2019-03-29 MED ORDER — INSULIN ASPART 100 UNIT/ML ~~LOC~~ SOLN: 1.0000 [IU] | SUBCUTANEOUS | Status: DC

## 2019-03-29 MED ORDER — CHLORHEXIDINE GLUCONATE CLOTH 2 % EX PADS: 6.0000 | MEDICATED_PAD | Freq: Every day | CUTANEOUS | Status: DC

## 2019-03-29 MED ORDER — DEXTROSE 50 % IV SOLN
1.0000 | Freq: Once | INTRAVENOUS | Status: AC
  Administered 2019-03-29: 50 mL via INTRAVENOUS

## 2019-03-29 MED ORDER — PREDNISONE 10 MG PO TABS: 5.0000 mg | ORAL_TABLET | Freq: Every day | ORAL | Status: DC

## 2019-03-29 MED ORDER — PANTOPRAZOLE SODIUM 40 MG IV SOLR
40.0000 mg | INTRAVENOUS | Status: DC
  Administered 2019-03-29: 40 mg via INTRAVENOUS
  Filled 2019-03-29: qty 40

## 2019-03-29 MED ORDER — CARVEDILOL 12.5 MG PO TABS
12.5000 mg | ORAL_TABLET | Freq: Two times a day (BID) | ORAL | Status: DC
  Administered 2019-03-29: 12.5 mg via ORAL
  Filled 2019-03-29: qty 1

## 2019-03-29 MED ORDER — WARFARIN SODIUM 2.5 MG PO TABS
2.5000 mg | ORAL_TABLET | Freq: Once | ORAL | Status: AC
  Administered 2019-03-29: 2.5 mg via ORAL
  Filled 2019-03-29: qty 1

## 2019-03-29 MED ORDER — MIDAZOLAM HCL 2 MG/2ML IJ SOLN
1.0000 mg | INTRAMUSCULAR | Status: DC | PRN
  Administered 2019-03-29: 1 mg via INTRAVENOUS
  Filled 2019-03-29: qty 2

## 2019-03-29 MED ORDER — DEXTROSE 10 % IV SOLN
INTRAVENOUS | Status: DC
  Administered 2019-03-29: 1 mL via INTRAVENOUS

## 2019-03-29 MED ORDER — SODIUM BICARBONATE 8.4 % IV SOLN
INTRAVENOUS | Status: DC
  Administered 2019-03-29: 16:00:00 via INTRAVENOUS
  Filled 2019-03-29 (×2): qty 150

## 2019-03-29 MED ORDER — ACETAMINOPHEN 325 MG PO TABS: 650.0000 mg | ORAL_TABLET | Freq: Four times a day (QID) | ORAL | Status: DC | PRN

## 2019-03-29 MED ORDER — DEXTROSE 50 % IV SOLN: 1.0000 | Freq: Once | INTRAVENOUS | Status: AC

## 2019-03-29 MED ORDER — RENA-VITE PO TABS: 1.0000 | ORAL_TABLET | Freq: Every day | ORAL | Status: DC

## 2019-03-30 ENCOUNTER — Encounter (HOSPITAL_COMMUNITY): Payer: Self-pay | Admitting: *Deleted

## 2019-03-30 ENCOUNTER — Other Ambulatory Visit: Payer: Self-pay | Admitting: *Deleted

## 2019-03-30 LAB — POCT I-STAT 7, (LYTES, BLD GAS, ICA,H+H)
Acid-Base Excess: >30 mmol/L — ABNORMAL HIGH (ref 0.0–2.0)
Bicarbonate: 60.3 mmol/L — ABNORMAL HIGH (ref 20.0–28.0)
Calcium, Ion: 0.64 mmol/L — CL (ref 1.15–1.40)
HCT: 33 % — ABNORMAL LOW (ref 36.0–46.0)
Hemoglobin: 11.2 g/dL — ABNORMAL LOW (ref 12.0–15.0)
O2 Saturation: 98 %
Patient temperature: 97.3
Potassium: 4.1 mmol/L (ref 3.5–5.1)
Sodium: 157 mmol/L — ABNORMAL HIGH (ref 135–145)
TCO2: >50 mmol/L — ABNORMAL HIGH (ref 22–32)
pCO2 arterial: 84.9 mmHg (ref 32.0–48.0)
pH, Arterial: 7.457 — ABNORMAL HIGH (ref 7.350–7.450)
pO2, Arterial: 113 mmHg — ABNORMAL HIGH (ref 83.0–108.0)

## 2019-03-30 NOTE — Progress Notes (Signed)
Dr. Nevada Crane notified that pt has become more lethargic. This RN told Dr. Nevada Crane that she felt pt was not ready to transfer off of unit. This RN asked Dr. Nevada Crane if she would like to order an ABG. Dr. Nevada Crane gave verbal order to d/c the transfer order and said she would come assess pt at bedside. Will continue to monitor.

## 2019-03-30 NOTE — Progress Notes (Signed)
Dialysis Coordinator reviewed notes from this afternoon and will attempt again tomorrow to contact either patient (if able) or family member (if available) to continue OP HD seat referral process in order to be sensitive to patient's medical situation and needs.  Alphonzo Cruise Dialysis Coordinator [phone]

## 2019-03-30 NOTE — Progress Notes (Signed)
Had recurrent PEA x 2.  Recovered after additional CPR and epinephrine.  Will start on epinephrine gtt.  Husband notified of events and will be allowed to come to hospital.  Chesley Mires, MD [place] 04-25-2019, 4:27 PM

## 2019-03-30 NOTE — Progress Notes (Signed)
ANTICOAGULATION CONSULT NOTE - Initial Consult  Pharmacy Consult for warfarin Indication: atrial fibrillation  Allergies  Allergen Reactions  . Enalapril Other (See Comments)    Systemic reaction - jerking  . Codeine Itching    Patient Measurements: Height: 5\' 2"  (157.5 cm) Weight: 224 lb 3.3 oz (101.7 kg) IBW/kg (Calculated) : 50.1  Vital Signs: Temp: 97.3 F (36.3 C) (03/31 1221) Temp Source: Oral (03/31 1221) BP: 92/70 (03/31 1300) Pulse Rate: 66 (03/31 1300)  Labs: Recent Labs    03/27/19 0229 03/28/19 0224 03/28/19 1309 03/28/19 1637 03/30/2019 0106 03-30-19 0500 03-30-19 0700  HGB 10.8*  --  10.1*  --   --   --  10.0*  HCT 35.8*  --  33.3*  --   --   --  33.7*  PLT 92*  --  80*  --   --   --  78*  LABPROT 36.3* 36.6*  --   --   --  29.4*  --   INR 3.7* 3.8*  --   --   --  2.8*  --   CREATININE 6.78* 8.06* 8.00*  --   --  7.72*  --   TROPONINI  --   --   --  0.73* 0.75*  --   --     Estimated Creatinine Clearance: 7.4 mL/min (A) (by C-G formula based on SCr of 7.72 mg/dL (H)).   Medical History: Past Medical History:  Diagnosis Date  . Anemia   . CHF (congestive heart failure) ([place])   . CKD (chronic kidney disease), stage IV ([place])   . Complication of anesthesia    " I aspirated during my gallbladder surgery"  . Diabetes ([place])   . GERD (gastroesophageal reflux disease)   . Heart murmur   . HTN (hypertension)   . Morbid obesity with BMI of 45.0-49.9, adult ([place])   . OSA (obstructive sleep apnea)    with AHI 11/hr with nocturnal hypoxemia , uses cpap  . PAF (paroxysmal atrial fibrillation) ([place])    s/p DCCV in 8/17  . Pneumonia   . RA (rheumatoid arthritis) ([place])   . Wears glasses   . Wears partial dentures     Assessment: 43 yoF admitted with perianal abscess c/b hypotension and PEA arrest. Pt on warfarin PTA for PAF. INR held for OR initially and then remained on hold with elevated INR. Pt ok to resume warfarin per pharmacy. INR 2.8, H/H and  pltc low but stable.   **PTA Dose 2.5mg  daily (supratherapeutic INR on this dose in clinic)  Goal of Therapy:  INR 2-3 Monitor platelets by anticoagulation protocol: Yes   Plan:  -Warfarin 2.5mg  PO x1 tonight -Daily protime  Arrie Senate, PharmD, BCPS Clinical Pharmacist [phone] Please check AMION for all [place] numbers 30-Mar-2019

## 2019-03-30 NOTE — Procedures (Signed)
OGT Placement By MD  OGT placed under direct laryngoscopy and verified by auscultation.  Melissa Bartlett, M.D.  Pulmonary/Critical Care Medicine. Pager: [phone]. After hours pager: [phone]. 

## 2019-03-30 NOTE — Progress Notes (Signed)
NAME:  Melissa Bartlett, MRN:  [id number], DOB:  08-23-1946, LOS: 5 ADMISSION DATE:  03/28/2019, CONSULTATION DATE:  3/30 REFERRING MD:  [place] Crane, CHIEF COMPLAINT:  S/p cardiac arrest   Brief History   73 yo female admitted with perianal abscess and had hypotension after I&D.  Developed worsening renal fx 3/29, and nephrology consulted.  Developed cardiac arrest 3/30 with ROSC in 4 minutes.  Remained hypotensive after arrest, and transfer to ICU.  Past Medical History  CHF, paroxysmal atrial fibrillation, CKD stage IV, hypertension, diabetes type 2, obstructive sleep apnea, obesity, RA.  Significant Hospital Events   3/25: Admitted with perianal abscess went to the OR for excision and drainage.  Pulmonary initially consulted to assist with hypotension management antibiotics started 3/27: Critical care signed off 3/28: Surgical service is signing off 3/29: Worsening renal failure and oliguria nephrology consulted 3/30: Brief PEA arrest estimated 4 minutes before return of spontaneous circulation awake and following commands post code critical care asked to see again 3/31: PEA cardiac arrest  Consults:  Surgical services consulted 3/25, signed off 3/28 Critical care reconsulted on 3/30 Nephrology 3/29  Procedures:  Lt IJ CVL 3/30 >> ETT 3/31 >> A line 3/31 >>  Significant Diagnostic Tests:  CT abdomen pelvis 3/25 >> superficial left perineum abscess with questionable communication to the anus measures 29 x 19 x 35 mm. See series 8, image 122. Regional cellulitis. No subcutaneous gas.  Echo 3/30 >> EF 55 to 60%, severe RV systolic dysfx  Micro Data:  Perineal abscess culture 3/26 >> Bacteroides 3/25: Blood cultures>>> negative  Antimicrobials:  Zosyn 3/27 through 3/28  Interim history/subjective:  S/p CPR and ETT.  Objective   Blood pressure (!) 126/109, pulse (!) 103, temperature (!) 97.3 F (36.3 C), temperature source Oral, resp. rate 14, height 5\' 2"  (1.575 m), weight  101.7 kg, SpO2 97 %. CVP:  [19 mmHg-26 mmHg] 20 mmHg      Intake/Output Summary (Last 24 hours) at April 06, 2019 1541 Last data filed at 2019/04/06 1000 Gross per 24 hour  Intake 1061.72 ml  Output 1500 ml  Net -438.28 ml   Filed Weights   03/28/19 1158 03/28/19 2145 04-06-2019 0015  Weight: 112 kg 103.2 kg 101.7 kg    Examination:  General - unresponsive Eyes - pupils reactive ENT - ETT in place Cardiac - regular, tachycardic Chest - b/l rhonchi, decreased BS Abdomen - soft, decreased bowel sounds Extremities - no cyanosis, clubbing, or edema Skin - extremities cool Neuro - not following commands   Assessment & Plan:   PEA arrest in setting of narcotic pain medications with renal failure with recurrent episode 3/31. Cardiogenic shock. Paroxysmal A fib. Chronic diastolic HF. Plan - pressors to keep MAP > 65, HR > 60 - f/u labs - coumadin per pharmacy  Acute on chronic renal failure CKD stage IV w/ hyperkalemia. Plan - HD per renal >> might need CRRT - add HCO3 to IV fluid - f/u BMET, ABG  Acute hypoxic respiratory failure in setting of PEA. Obstructive sleep apnea. Plan - full vent support - f/u CXR, ABG  DM w/ hyperglycemia. Hypoglycemia after getting insulin during cardiac arrest. Plan - SSI - f/u CBG - add D10 to IV fluid for now  GERD. Plan - protonix  S/p I&D of perirectal abscess. Plan - wound care  Hx of rheumatoid arthritis. Plan - prednisone 5 mg daily  Best practice:  Diet: NPO DVT prophylaxis: coumadin GI prophylaxis: protonix Mobility: bed rest Code Status: full  code   Disposition: ICU  Labs    CMP Latest Ref Rng & Units 04-01-2019 03/28/2019 03/28/2019  Glucose 70 - 99 mg/dL 117(H) 183(H) 195(H)  BUN 8 - 23 mg/dL 108(H) 122(H) 120(H)  Creatinine 0.44 - 1.00 mg/dL 7.72(H) 8.00(H) 8.06(H)  Sodium 135 - 145 mmol/L 139 136 134(L)  Potassium 3.5 - 5.1 mmol/L 4.7 4.8 5.2(H)  Chloride 98 - 111 mmol/L 96(L) 98 97(L)  CO2 22 - 32  mmol/L 26 25 20(L)  Calcium 8.9 - 10.3 mg/dL 7.5(L) 6.9(L) 7.0(L)  Total Protein 6.5 - 8.1 g/dL - 4.9(L) -  Total Bilirubin 0.3 - 1.2 mg/dL - 1.0 -  Alkaline Phos 38 - 126 U/L - 135(H) -  AST 15 - 41 U/L - 201(H) -  ALT 0 - 44 U/L - 351(H) -   CBC Latest Ref Rng & Units 04-01-2019 03/28/2019 03/27/2019  WBC 4.0 - 10.5 K/uL 10.5 11.1(H) 11.6(H)  Hemoglobin 12.0 - 15.0 g/dL 10.0(L) 10.1(L) 10.8(L)  Hematocrit 36.0 - 46.0 % 33.7(L) 33.3(L) 35.8(L)  Platelets 150 - 400 K/uL 78(L) 80(L) 92(L)   ABG    Component Value Date/Time   PHART 7.303 (L) 03/28/2019 1340   PCO2ART 42.1 03/28/2019 1340   PO2ART 364 (H) 03/28/2019 1340   HCO3 20.2 03/28/2019 1340   ACIDBASEDEF 5.0 (H) 03/28/2019 1340   O2SAT 97.8 03/28/2019 1340   CBG (last 3)  Recent Labs    04/01/19 0703 2019-04-01 0739 04/01/2019 1211  GLUCAP 106* 90 118*   CC time 36 minutes independent of procedure time  Chesley Mires, MD [place] 04/01/2019, 3:41 PM

## 2019-03-30 NOTE — Procedures (Signed)
Intubation Procedure Note Melissa Bartlett [id number] 1946-06-12  Procedure: Intubation Indications: Respiratory insufficiency  Procedure Details Consent: Unable to obtain consent because of altered level of consciousness. Time Out: Verified patient identification, verified procedure, site/side was marked, verified correct patient position, special equipment/implants available, medications/allergies/relevent history reviewed, required imaging and test results available.  Performed  Maximum sterile technique was used including cap, gloves and hand hygiene.  MAC    Evaluation Hemodynamic Status: Persistent hypotension treated with pressors; O2 sats: stable throughout Patient's Current Condition: stable Complications: No apparent complications Patient did tolerate procedure well. Chest X-ray ordered to verify placement.  CXR: pending.   Jennet Maduro 10-Apr-2019

## 2019-03-30 NOTE — Code Documentation (Signed)
Patient developed PEA arrest with hypoxia.  CPR started.  Intubated.  Received D50/insulin, epi, HCO3.  ROSC after 4 minutes.  Chesley Mires, MD [facility] Pulmonary/Critical Care 2019/04/25, 3:23 PM

## 2019-03-30 NOTE — Progress Notes (Addendum)
Physical Therapy Treatment Patient Details Name: Melissa Bartlett MRN: [id number] DOB: September 28, 1946 Today's Date: 04/11/2019    History of Present Illness Melissa Bartlett is a 73 y.o. female with medical history significant of CHF, CKD, diabetes, essential hypertension, morbid obesity, obstructive sleep apnea, paroxysmal atrial fibrillation, rheumatoid arthritis. 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 PEA arrest on 3/30.     PT Comments    Patient seen for therapy progression in conjunction with OT therapist.  Limited to bed level, EOB and next to bed due to increasing concerns for symptomatic labile BP, dizziness, nausea, diaphoresis and general change in responsiveness and behaviors. Nsg called to room to assess.  OF NOTE: BP progressively declining 127/76; 114/75; 96/69; 89/46.  HR elevations fluctuating between 100s to upper 130s with two episodes of non sustained vtac on monitor. NSG aware. Patient nauseated, dizzy, diaphoretic, and changes in responsiveness. Assisted back to bed and cold rag placed. Nsg notified and to room.    Follow Up Recommendations  Home health PT;Supervision/Assistance - 24 hour     Equipment Recommendations  Rolling walker with 5" wheels;3in1 (PT)    Recommendations for Other Services OT consult(will order per protocol)     Precautions / Restrictions Precautions Precautions: Fall Restrictions Weight Bearing Restrictions: No    Mobility  Bed Mobility Overal bed mobility: Needs Assistance Bed Mobility: Supine to Sit;Sit to Supine     Supine to sit: Mod assist;+2 for safety/equipment Sit to supine: Max assist;+2 for physical assistance;+2 for safety/equipment   General bed mobility comments: mod assist for LB and trunk support to EOB, max assist +2 to return to supine due to nausea, hypotensive  Transfers Overall transfer level: Needs  assistance Equipment used: 1 person hand held assist Transfers: Sit to/from Stand Sit to Stand: Min assist         General transfer comment: stood and took side steps to [facility]  Ambulation/Gait Ambulation/Gait assistance: Min Web designer (Feet): 2 Feet         General Gait Details: Steps to [facility], patient dizzy, nauseated and diaphoretic   Stairs             Wheelchair Mobility    Modified Rankin (Stroke Patients Only)       Balance Overall balance assessment: Needs assistance Sitting-balance support: Bilateral upper extremity supported;Feet supported Sitting balance-Leahy Scale: Fair Sitting balance - Comments: min guard for safety,   Standing balance support: During functional activity Standing balance-Leahy Scale: Poor                              Cognition Arousal/Alertness: Lethargic Behavior During Therapy: WFL for tasks assessed/performed Overall Cognitive Status: Within Functional Limits for tasks assessed                                        Exercises      General Comments General comments (skin integrity, edema, etc.): BP supine 127/76, EOB 114/75, sustained EOB 96/69, supine 89/46 with RN present       Pertinent Vitals/Pain Pain Assessment: Faces Faces Pain Scale: Hurts a little bit Pain Location: perianal surgical site, chest soreness  Pain Descriptors / Indicators: Operative site guarding;Grimacing;Discomfort;Sore Pain Intervention(s): Limited activity within patient's tolerance;Monitored during session;Repositioned    Home Living  Prior Function            PT Goals (current goals can now be found in the care plan section) Acute Rehab PT Goals Patient Stated Goal: none stated PT Goal Formulation: With patient Time For Goal Achievement: 04/10/19 Potential to Achieve Goals: Good Progress towards PT goals: Not progressing toward goals - comment(remains labile BP and  sympotmatic)    Frequency    Min 3X/week      PT Plan Current plan remains appropriate    Co-evaluation PT/OT/SLP Co-Evaluation/Treatment: Yes Reason for Co-Treatment: Complexity of the patient's impairments (multi-system involvement);For patient/therapist safety PT goals addressed during session: Mobility/safety with mobility        AM-PAC PT "6 Clicks" Mobility   Outcome Measure  Help needed turning from your back to your side while in a flat bed without using bedrails?: A Little Help needed moving from lying on your back to sitting on the side of a flat bed without using bedrails?: A Lot Help needed moving to and from a bed to a chair (including a wheelchair)?: A Lot Help needed standing up from a chair using your arms (e.g., wheelchair or bedside chair)?: A Lot Help needed to walk in hospital room?: A Lot Help needed climbing 3-5 steps with a railing? : Total 6 Click Score: 12    End of Session     Patient left: in bed;with call bell/phone within reach;with nursing/sitter in room   PT Visit Diagnosis: Unsteadiness on feet (R26.81);Other abnormalities of gait and mobility (R26.89);Pain Pain - part of body: (perianal)     Time: 9038-3338 PT Time Calculation (min) (ACUTE ONLY): 23 min  Charges:  $Therapeutic Activity: 8-22 mins                     Alben Deeds, PT DPT  Board Certified Neurologic Specialist [place] Pager [phone] Office [phone]    Duncan Dull 04/22/19, 2:07 PM

## 2019-03-30 NOTE — Progress Notes (Addendum)
PROGRESS NOTE  Melissa Bartlett RUE:[id number] DOB: 1946-04-09 DOA: 03/10/2019 PCP: Donald Prose, MD  HPI/Recap of past 24 hours: Melissa Bartlett is a 73 y.o. female with medical history significant of CHF, CKD, diabetes, essential hypertension, morbid obesity, obstructive sleep apnea, paroxysmal atrial fibrillation, rheumatoid arthritis.  Patient reports symptoms of pain in the perineum that started the day before presentation.  She has associated nausea, and diarrhea.  She reports a fever with a T-max of 100.7 F.  She has taken Preparation H in addition to trying to use an onion per her daughters suggestion which did not help her symptoms.  Symptoms worsened. Vitals in the ED: Labs: WBC of 16.2K, troponin of 0.09, INR 2.3 Imaging: CTA pelvis significant for an abscess located in left perineum with possible communication to the anus.  General surgery consulted and followed.  Post I&D on 03/18/2019.  Postoperative hypotension following excision and drainage for which she was started on phenylephrine and transferred to [facility] service.  Transferred to [facility] service on 03/26/2019.  Hospital course complicated by worsening AKI on CKD4 with oliguria.  Nephrology consulted and following.  PEA arrest on 03/28/19 thought to be 2/2 to opiates in the setting of renal failure.  Apr 06, 2019: Patient seen and examined at her bedside.  Wearing her CPAP.  Reports soreness in her chest from CPR.  Denies dyspnea at rest or palpitations.   Assessment/Plan: Principal Problem:   Perineal abscess Active Problems:   DM (diabetes mellitus), type 2 with renal complications (HCC)   Essential hypertension   Morbid obesity (HCC)   Paroxysmal atrial fibrillation (HCC)   CKD (chronic kidney disease) stage 4, GFR 15-29 ml/min (HCC)   OSA (obstructive sleep apnea)   Acute respiratory failure with hypoxia (HCC)   RA (rheumatoid arthritis) (HCC)   Perianal abscess   Difficult intravenous access   Cardiac arrest ([place])   Post PEA arrest on 03/28/19 suspect secondary to opiates in the setting of renal failure PEA arrest on 03/28/2019 thought from opiate use in the setting of advanced kidney failure >> CPR for about 5 minutes with ROSC Chest soreness post CPR Elevated troponin peaked at 0.75 possibly from CPR 2D echo unrevealing with normal LVEF of 60%, severe tricuspid valve regurgitation with moderate sclerosis of the aortic valve Independently reviewed chest x-ray done on April 06, 2019 which reveals cardiomegaly with increase in pulmonary vascularity Continue to hold off opiates Continue close monitoring  Transaminitis Worsening LFTs as of 03/28/19 Repeat CMP today Obtain abd U/S  Thrombocytopenia Worsening plt count Obtain abd U/S and peripheral smear Closely monitor plt level on coumadin for CVA prevention Pharmacy managing coumadin  Elevated troponin suspect demand ischemia versus chest compressions Troponin peaked at 0.75 03/28/2019 shows A. fib with RVR rate of 131 with no specific ST-T changes Cardiology following 2D echo ordered and completed  New end-stage renal disease on dialysis Had worsening AKI on CKD 4 with oliguria Started hemodialysis on 03/28/2019 Awaiting HD spot Nephrology following  Hypotension Antihypertensive held off due to hypotension with systolic blood pressure in the 40s Subsequently started on Levophed and transferred to ICU Now off Levophed Blood pressure normotensive Continue to closely monitor vital signs  Perineal abscess status post I&D on 03/19/2019 DC abx 03/25/19 as recommended by general surgery Dressing changes as recommended by general surgery Afebrile WBC trending down from 14 K to 11 K Will follow-up with general surgery outpatient Afebrile Avoid opiates for now due to recent PEA arrest  Permanent A. fib with occasional RVR  Currently off her beta-blocker and calcium channel blockers due to hypotension On Coumadin for primary CVA prevention Cardiology  is following Continue to closely monitor vital signs  Mild pulmonary edema from fluid overload secondary to renal failure Nephrology following for hemodialysis First hemodialysis was 03/28/2019 Maintain O2 saturation greater than 92%  OSA on CPAP Continue CPAP at night  Rheumatoid arthritis on chronic steroid use Continue prednisone 5 mg daily  Resolving anion gap metabolic acidosis secondary to acute on chronic renal failure Post bicarb infusion which was stopped on 2019/03/30 Nephrology following for hemodialysis  Resolved hypokalemia post hemodialysis on 03/28/2019   Moderate protein calorie malnutrition Albumin 2.6 Encourage increasing oral protein calorie intake  Chronic diastolic CHF LVEF greater than 65% on January 26, 2019 Repeat 2D echo done on 03/28/2019 reveals normal LVEF Continue strict I's and O's and daily weight Cardiology following  Physical debility/ambulatory dysfunction Physical therapy recommended home health PT Fall precautions  GERD Continue Protonix GI cocktail as needed  Hyperlipidemia Continue statin  Type 2 diabetes complicated with renal failure Not on antidiabetic medications at home Last hemoglobin A1c 5.9 on 05/16/2018 Repeat hemoglobin A1c Continue sensitive insulin sliding scale for hyperglycemia    DVT prophylaxis:  Coumadin Code Status: Full code Family Communication: None at bedside. Updated husband on the phone on 03/28/19. Disposition Plan:  Home when nephrology has an HD spot and cardiology signs off.  Consults called: General surgery by EDP, signed off on 03/26/2019, nephrology, pharmacy, PCCM, cardiology   Admission status: Inpatient   Objective: Vitals:   Mar 30, 2019 0400 03-30-19 0500 2019-03-30 0600 Mar 30, 2019 0700  BP: (!) 145/69 (!) 150/96 (!) 166/117   Pulse: 92 (!) 54 (!) 110   Resp: 18 16 (!) 8   Temp: (!) 97.3 F (36.3 C)   (!) 97.2 F (36.2 C)  TempSrc: Axillary   Axillary  SpO2: 96% 96% 95%   Weight:       Height:        Intake/Output Summary (Last 24 hours) at 03-30-2019 0719 Last data filed at 03/30/19 0600 Gross per 24 hour  Intake 1465.81 ml  Output 1500 ml  Net -34.19 ml   Filed Weights   03/28/19 1158 03/28/19 2145 03-30-19 0015  Weight: 112 kg 103.2 kg 101.7 kg    Exam:  . General: 73 y.o. year-old female obese in no acute distress.  Alert oriented x3.   . Cardiovascular: Irregular rate and rhythm with no rubs or gallops.  No JVD or thyromegaly noted. Marland Kitchen Respiratory: Mild rales at bases with no wheezes.  Poor inspiratory effort. . Abdomen: Soft nontender nondistended with normal bowel sounds x4 quadrants. . Musculoskeletal: Trace lower extremity edema.  2/4 pulses in all 4 extremities. . Skin: Left perineal region appears clean with surgical dressing in place. Marland Kitchen Psychiatry: Mood is appropriate for condition and setting.   Data Reviewed: CBC: Recent Labs  Lab 03/08/2019 2120 03/15/2019 0543 03/27/19 0229 03/28/19 1309  WBC 16.2* 14.1* 11.6* 11.1*  NEUTROABS 13.2*  --  9.6*  --   HGB 11.9* 11.0* 10.8* 10.1*  HCT 42.3 37.4 35.8* 33.3*  MCV 94.4 93.0 90.9 89.3  PLT 148* 132* 92* 80*   Basic Metabolic Panel: Recent Labs  Lab 03/22/2019 2157 02/28/2019 0543 03/27/19 0229 03/28/19 0224 03/28/19 1309  NA 142 142 136 134* 136  K 5.2* 4.5 5.5* 5.2* 4.8  CL 103 107 102 97* 98  CO2 26 26 15* 20* 25  GLUCOSE 177* 120* 103* 195* 183*  BUN  72* 75* 108* 120* 122*  CREATININE 3.65* 4.19* 6.78* 8.06* 8.00*  CALCIUM 8.4* 7.9* 7.4* 7.0* 6.9*   GFR: Estimated Creatinine Clearance: 7.1 mL/min (A) (by C-G formula based on SCr of 8 mg/dL (H)). Liver Function Tests: Recent Labs  Lab 03/17/2019 2157 03/03/2019 0543 03/28/19 1309  AST 57* 60* 201*  ALT 62* 59* 351*  ALKPHOS 71 70 135*  BILITOT 1.8* 1.3* 1.0  PROT 5.2* 5.1* 4.9*  ALBUMIN 2.7* 2.6* 2.7*   No results for input(s): LIPASE, AMYLASE in the last 168 hours. No results for input(s): AMMONIA in the last 168 hours.  Coagulation Profile: Recent Labs  Lab 03/25/19 0300 03/26/19 0455 03/27/19 0229 03/28/19 0224 04-10-2019 0500  INR 2.6* 3.6* 3.7* 3.8* 2.8*   Cardiac Enzymes: Recent Labs  Lab 03/14/2019 2204 03/28/19 1637 04-10-2019 0106  TROPONINI 0.09* 0.73* 0.75*   BNP (last 3 results) No results for input(s): PROBNP in the last 8760 hours. HbA1C: No results for input(s): HGBA1C in the last 72 hours. CBG: Recent Labs  Lab 03/28/19 1053 03/28/19 1256 03/28/19 1544 03/28/19 2152 April 10, 2019 0703  GLUCAP 176* 157* 148* 129* 106*   Lipid Profile: No results for input(s): CHOL, HDL, LDLCALC, TRIG, CHOLHDL, LDLDIRECT in the last 72 hours. Thyroid Function Tests: No results for input(s): TSH, T4TOTAL, FREET4, T3FREE, THYROIDAB in the last 72 hours. Anemia Panel: No results for input(s): VITAMINB12, FOLATE, FERRITIN, TIBC, IRON, RETICCTPCT in the last 72 hours. Urine analysis:    Component Value Date/Time   COLORURINE STRAW (A) 01/24/2019 1645   APPEARANCEUR CLEAR 01/24/2019 1645   LABSPEC 1.008 01/24/2019 1645   PHURINE 5.0 01/24/2019 1645   GLUCOSEU NEGATIVE 01/24/2019 1645   HGBUR NEGATIVE 01/24/2019 1645   BILIRUBINUR NEGATIVE 01/24/2019 1645   KETONESUR NEGATIVE 01/24/2019 1645   PROTEINUR 30 (A) 01/24/2019 1645   UROBILINOGEN 0.2 04/28/2010 0304   NITRITE NEGATIVE 01/24/2019 1645   LEUKOCYTESUR NEGATIVE 01/24/2019 1645   Sepsis Labs: @LABRCNTIP (procalcitonin:4,lacticidven:4)  ) Recent Results (from the past 240 hour(s))  Blood culture (routine x 2)     Status: None (Preliminary result)   Collection Time: 03/19/2019 11:15 PM  Result Value Ref Range Status   Specimen Description BLOOD LEFT HAND  Final   Special Requests   Final    BOTTLES DRAWN AEROBIC AND ANAEROBIC Blood Culture results may not be optimal due to an inadequate volume of blood received in culture bottles   Culture   Final    NO GROWTH 4 DAYS Performed at [facility], [place] [address], [place], [place]  [address]    Report Status PENDING  Incomplete  Blood culture (routine x 2)     Status: None (Preliminary result)   Collection Time: 02/27/2019 12:37 AM  Result Value Ref Range Status   Specimen Description BLOOD LEFT HAND  Final   Special Requests   Final    BOTTLES DRAWN AEROBIC AND ANAEROBIC Blood Culture adequate volume   Culture   Final    NO GROWTH 4 DAYS Performed at [facility], [place] [address] [place], [place] [address]    Report Status PENDING  Incomplete  Surgical pcr screen     Status: None   Collection Time: 03/13/2019  2:28 AM  Result Value Ref Range Status   MRSA, PCR NEGATIVE NEGATIVE Final   Staphylococcus aureus NEGATIVE NEGATIVE Final    Comment: (NOTE) The Xpert SA Assay (FDA approved for NASAL specimens in patients 24 years of age and older), is one  component of a comprehensive surveillance program. It is not intended to diagnose infection nor to guide or monitor treatment. Performed at [facility], [place] [address], [place], [place] [address]   Aerobic/Anaerobic Culture (surgical/deep wound)     Status: None (Preliminary result)   Collection Time: 02/28/2019 10:05 AM  Result Value Ref Range Status   Specimen Description ABSCESS PERINEAL  Final   Special Requests NONE  Final   Gram Stain   Final    FEW WBC PRESENT,BOTH PMN AND MONONUCLEAR MODERATE GRAM POSITIVE COCCI IN PAIRS MODERATE GRAM VARIABLE ROD Performed at [facility], [place] [address], [place], [place] [address]    Culture   Final    MODERATE BACTEROIDES FRAGILIS BETA LACTAMASE NEGATIVE AMONG AEROBIC MULTIPLE ORGANISMS PRESENT, NONE PREDOMINANT    Report Status PENDING  Incomplete      Studies: Dg Chest Port 1 View  Result Date: 03/28/2019 CLINICAL DATA:  Encounter for central line placement. EXAM: PORTABLE CHEST 1 VIEW COMPARISON:  Radiograph of March 26, 2019. FINDINGS: Stable cardiomegaly. Mild central pulmonary vascular congestion is noted. Fluid is noted in right minor  fissure. Minimal bibasilar subsegmental atelectasis is noted. No pneumothorax or pleural effusion is noted. Left internal jugular catheter has been placed with distal tip in expected position of cavoatrial junction. Bony thorax is unremarkable. IMPRESSION: Interval placement of left internal jugular catheter with distal tip in expected position of cavoatrial junction. Stable cardiomegaly with central pulmonary vascular congestion is noted. Minimal bibasilar subsegmental atelectasis. Electronically Signed   By: Marijo Conception, M.D.   On: 03/28/2019 12:47    Scheduled Meds: . atorvastatin  20 mg Oral q1800  . brimonidine  1 drop Both Eyes BID   And  . timolol  1 drop Both Eyes BID  . calcitRIOL  0.25 mcg Oral Q M,W,F  . Chlorhexidine Gluconate Cloth  6 each Topical Q0600  . Chlorhexidine Gluconate Cloth  6 each Topical Daily  . Chlorhexidine Gluconate Cloth  6 each Topical Q0600  . insulin aspart  0-9 Units Subcutaneous TID WC  . latanoprost  1 drop Both Eyes QHS  . multivitamin  1 tablet Oral QHS  . pantoprazole  40 mg Oral Daily  . predniSONE  5 mg Oral Daily  . vitamin B-12  1,000 mcg Oral Daily    Continuous Infusions: . sodium chloride    . sodium chloride       LOS: 5 days     Kayleen Memos, MD [facility] Pager [phone]  If 7PM-7AM, please contact night-coverage [email or web address] Password [facility] Apr 12, 2019, 7:19 AM

## 2019-03-30 NOTE — Progress Notes (Signed)
Subjective: Interval History: has complaints breathing better , good HD but oozing after.  Objective: Vital signs in last 24 hours: Temp:  [97.2 F (36.2 C)-97.9 F (36.6 C)] 97.2 F (36.2 C) (03/31 0700) Pulse Rate:  [39-112] 110 (03/31 0600) Resp:  [8-23] 8 (03/31 0600) BP: (70-166)/(45-117) 166/117 (03/31 0600) SpO2:  [90 %-100 %] 95 % (03/31 0600) Weight:  [101.7 kg-112 kg] 101.7 kg (03/31 0015) Weight change:   Intake/Output from previous day: 03/30 0701 - 03/31 0700 In: 1465.8 [I.V.:1465.8] Out: 1500  Intake/Output this shift: No intake/output data recorded.  General appearance: alert, cooperative, moderately obese and on BIPAP Resp: diminished breath sounds bilaterally and rales bibasilar Cardio: S1, S2 normal and systolic murmur: holosystolic 2/6, blowing at apex GI: obese pos bs, perineum not eval Extremities: edema 3+ and RUA AVF  Lab Results: Recent Labs    03/27/19 0229 03/28/19 1309  WBC 11.6* 11.1*  HGB 10.8* 10.1*  HCT 35.8* 33.3*  PLT 92* 80*   BMET:  Recent Labs    03/28/19 0224 03/28/19 1309  NA 134* 136  K 5.2* 4.8  CL 97* 98  CO2 20* 25  GLUCOSE 195* 183*  BUN 120* 122*  CREATININE 8.06* 8.00*  CALCIUM 7.0* 6.9*   No results for input(s): PTH in the last 72 hours. Iron Studies: No results for input(s): IRON, TIBC, TRANSFERRIN, FERRITIN in the last 72 hours.  Studies/Results: Dg Chest Port 1 View  Result Date: 03/28/2019 CLINICAL DATA:  Encounter for central line placement. EXAM: PORTABLE CHEST 1 VIEW COMPARISON:  Radiograph of March 26, 2019. FINDINGS: Stable cardiomegaly. Mild central pulmonary vascular congestion is noted. Fluid is noted in right minor fissure. Minimal bibasilar subsegmental atelectasis is noted. No pneumothorax or pleural effusion is noted. Left internal jugular catheter has been placed with distal tip in expected position of cavoatrial junction. Bony thorax is unremarkable. IMPRESSION: Interval placement of left  internal jugular catheter with distal tip in expected position of cavoatrial junction. Stable cardiomegaly with central pulmonary vascular congestion is noted. Minimal bibasilar subsegmental atelectasis. Electronically Signed   By: Marijo Conception, M.D.   On: 03/28/2019 12:47    I have reviewed the patient's current medications.  Assessment/Plan: 1 CKD5/ now probable ESRD HD yest, will plan in am .Vol xs 2 Anemia esa, check Fe 3 DM control 4 HPTH check 5 Obesity 6 Perineal abscess.  P HD, esa, check Fe, control DM     LOS: 5 days   Jeneen Rinks Yesenia Locurto 04-27-19,7:19 AM

## 2019-03-30 NOTE — Procedures (Signed)
Arterial Catheter Insertion Procedure Note SHARISSA BRIERLEY [id number] 03/17/1946  Procedure: Insertion of Arterial Catheter  Indications: Blood pressure monitoring and Frequent blood sampling  Procedure Details Consent: Unable to obtain consent because of emergent medical necessity. Time Out: Verified patient identification, verified procedure, site/side was marked, verified correct patient position, special equipment/implants available, medications/allergies/relevent history reviewed, required imaging and test results available.  Performed  Maximum sterile technique was used including antiseptics, cap, gloves, gown, hand hygiene, mask and sheet. Skin prep: Chlorhexidine; local anesthetic administered 20 gauge catheter was inserted into right femoral artery using the Seldinger technique. ULTRASOUND GUIDANCE USED: YES Evaluation Blood flow good; BP tracing good. Complications: No apparent complications.   Clementeen Graham April 09, 2019 Erick Colace ACNP-BC [place] Pager # [phone] OR # [phone] if no answer

## 2019-03-30 NOTE — Progress Notes (Addendum)
Progress Note  Patient Name: Melissa Bartlett Date of Encounter: Apr 14, 2019  Primary Cardiologist: Candee Furbish, MD  Subjective   Doing better. Off BiPAP. Off pressors, now hypertensive. AFib w RVR.  Inpatient Medications    Scheduled Meds: . atorvastatin  20 mg Oral q1800  . brimonidine  1 drop Both Eyes BID   And  . timolol  1 drop Both Eyes BID  . calcitRIOL  0.25 mcg Oral Q M,W,F  . Chlorhexidine Gluconate Cloth  6 each Topical Q0600  . Chlorhexidine Gluconate Cloth  6 each Topical Daily  . Chlorhexidine Gluconate Cloth  6 each Topical Q0600  . insulin aspart  0-9 Units Subcutaneous TID WC  . latanoprost  1 drop Both Eyes QHS  . multivitamin  1 tablet Oral QHS  . pantoprazole  40 mg Oral Daily  . predniSONE  5 mg Oral Daily  . vitamin B-12  1,000 mcg Oral Daily   Continuous Infusions: . sodium chloride    . sodium chloride     PRN Meds: sodium chloride, acetaminophen, alteplase, heparin, lidocaine (PF), lidocaine-prilocaine, ondansetron (ZOFRAN) IV, pentafluoroprop-tetrafluoroeth, phenol, simethicone   Vital Signs    Vitals:   14-Apr-2019 0500 2019-04-14 0600 04-14-19 0700 04/14/2019 0741  BP: (!) 150/96 (!) 166/117    Pulse: (!) 54 (!) 110    Resp: 16 (!) 8    Temp:   (!) 97.2 F (36.2 C) 97.8 F (36.6 C)  TempSrc:   Axillary Oral  SpO2: 96% 95%    Weight:      Height:        Intake/Output Summary (Last 24 hours) at 04/14/19 0938 Last data filed at 2019-04-14 0600 Gross per 24 hour  Intake 1465.86 ml  Output 1500 ml  Net -34.14 ml   Last 3 Weights 04-14-19 03/28/2019 03/28/2019  Weight (lbs) 224 lb 3.3 oz 227 lb 8.2 oz 246 lb 14.6 oz  Weight (kg) 101.7 kg 103.2 kg 112 kg     Telemetry    Atrial fib HR 105-120s - Personally Reviewed  Physical Exam    GEN: No acute distress. Obese. HEENT: Normocephalic, atraumatic, sclera non-icteric. Neck: No JVD or bruits. Cardiac: RRR no murmurs, rubs, or gallops.  Radials/DP/PT 1+ and equal bilaterally.   Respiratory: Clear to auscultation bilaterally. Breathing is unlabored. GI: Soft, nontender, non-distended, BS +x 4. MS: no deformity. Extremities: No clubbing or cyanosis. No edema. Distal pedal pulses are 2+ and equal bilaterally. Neuro:  AAOx3. Follows commands. Psych:  Responds to questions appropriately with a normal affect.  Labs    Chemistry Recent Labs  Lab 02/28/2019 2157 03/22/2019 0543  03/28/19 0224 03/28/19 1309 04-14-2019 0500  NA 142 142   < > 134* 136 139  K 5.2* 4.5   < > 5.2* 4.8 4.7  CL 103 107   < > 97* 98 96*  CO2 26 26   < > 20* 25 26  GLUCOSE 177* 120*   < > 195* 183* 117*  BUN 72* 75*   < > 120* 122* 108*  CREATININE 3.65* 4.19*   < > 8.06* 8.00* 7.72*  CALCIUM 8.4* 7.9*   < > 7.0* 6.9* 7.5*  PROT 5.2* 5.1*  --   --  4.9*  --   ALBUMIN 2.7* 2.6*  --   --  2.7*  --   AST 57* 60*  --   --  201*  --   ALT 62* 59*  --   --  351*  --  ALKPHOS 71 70  --   --  135*  --   BILITOT 1.8* 1.3*  --   --  1.0  --   GFRNONAA 12* 10*   < > 5* 5* 5*  GFRAA 14* 12*   < > 5* 5* 6*  ANIONGAP 13 9   < > 17* 13 17*   < > = values in this interval not displayed.     Hematology Recent Labs  Lab 03/27/19 0229 03/28/19 1309 Apr 16, 2019 0700  WBC 11.6* 11.1* 10.5  RBC 3.94 3.73* 3.81*  HGB 10.8* 10.1* 10.0*  HCT 35.8* 33.3* 33.7*  MCV 90.9 89.3 88.5  MCH 27.4 27.1 26.2  MCHC 30.2 30.3 29.7*  RDW 16.0* 15.9* 15.9*  PLT 92* 80* 78*    Cardiac Enzymes Recent Labs  Lab 03/10/2019 2204 03/28/19 1637 04-16-19 0106  TROPONINI 0.09* 0.73* 0.75*   No results for input(s): TROPIPOC in the last 168 hours.   BNPNo results for input(s): BNP, PROBNP in the last 168 hours.   DDimer No results for input(s): DDIMER in the last 168 hours.   Radiology    Dg Chest Port 1 View  Result Date: 04-16-2019 CLINICAL DATA:  Shortness of breath EXAM: PORTABLE CHEST 1 VIEW COMPARISON:  03/28/2019 FINDINGS: Cardiac shadow is enlarged. Left jugular central line is again seen and stable.  Vascular congestion is again noted. Some improved aeration in the bases is noted bilaterally. No bony abnormality is seen. IMPRESSION: Improved aeration bilaterally.  Stable vascular congestion is seen. Electronically Signed   By: Inez Catalina M.D.   On: April 16, 2019 07:35   Dg Chest Port 1 View  Result Date: 03/28/2019 CLINICAL DATA:  Encounter for central line placement. EXAM: PORTABLE CHEST 1 VIEW COMPARISON:  Radiograph of March 26, 2019. FINDINGS: Stable cardiomegaly. Mild central pulmonary vascular congestion is noted. Fluid is noted in right minor fissure. Minimal bibasilar subsegmental atelectasis is noted. No pneumothorax or pleural effusion is noted. Left internal jugular catheter has been placed with distal tip in expected position of cavoatrial junction. Bony thorax is unremarkable. IMPRESSION: Interval placement of left internal jugular catheter with distal tip in expected position of cavoatrial junction. Stable cardiomegaly with central pulmonary vascular congestion is noted. Minimal bibasilar subsegmental atelectasis. Electronically Signed   By: Marijo Conception, M.D.   On: 03/28/2019 12:47    Cardiac Studies   2D echo 03/28/19 IMPRESSIONS    1. The left ventricle has normal systolic function, with an ejection fraction of 55-60%. The cavity size was normal. There is mildly increased left ventricular wall thickness. indeterminate.  2. The right ventricle has severely reduced systolic function. The cavity was severely enlarged. There is no increase in right ventricular wall thickness.  3. Left atrial size was mildly dilated.  4. Right atrial size was mildly dilated.  5. Mild thickening of the mitral valve leaflet. Mild calcification of the mitral valve leaflet.  6. Tricuspid valve regurgitation is severe.  7. The aortic valve is tricuspid Moderate thickening of the aortic valve Moderate sclerosis of the aortic valve. Aortic valve regurgitation is mild by color flow Doppler.  8. The  aortic root is normal in size and structure.  9. The inferior vena cava was dilated in size with <50% respiratory variability. 10. The interatrial septum was not well visualized.  Patient Profile     73 y.o. female with permanent atrial fib, CKD stage IV, GERD, HTN, DM, RA on chronci steroids, morbid obesity, OSA on CPAP, heart murmur,  normal coronaries 10/2017 who was admitted 03/11/2019 with n/v/diarrhea, in afib with RVR on arrival with mild fever, perineal abscess and sepsis requiring pressor support.  She underwent incision and drainage of perineal abscess along with excision of perianal and right labia condyloma on 3/26. On 3/30 she ambulated with PT around the room at 10:30 but around 10:50am she was found unresponsive with PEA arrest.  CODE BLUE was subsequently called, CPR was performed for 5 minutes and patient received epinephrine and a sodium bicarb and then achieved ROSC.  Assessment & Plan    1. PEA arrest - no acute arrhythmia detected, normal cors 2018. Suspected to be due to opiates and renal failure with ongoing metabolic issues. 2D echo showed normal LV function but acute RV dysfunction. Dr. Sallyanne Kuster reviewed this and felt since she had been fully anticoagulated throughout this hospital stay and the preceding month, PE would appear unlikely and this may be due to hypoxia.   2. Sepsis related to perineal abscess - underwent I/D.  3. Acute on chronic renal insufficiency, possibly now ESRD per renal - followed by nephrology, initiated on HD this admission.  4. Permanent atrial fib with RVR - home diltiazem and carvedilol stopped due to hypotension requiring pressors this admission. Consider resumption of some degree home meds now that BP beginning to trend up. INR 2.8 today. I do not see that she has a Coumadin per pharmacy order - will ask MD to review - question whether we should consider transitioning to Eliquis 2.5mg  BID at DC once definitive ESRD/HD plan established given Covid  pandemic to decrease need for blood draws.  5. OSA on CPAP - continue CPAP qhs as tolerated.  6. Elevated troponin - felt due to PEA arrest. EF preserved.  For questions or updates, please contact [place] Please consult [email or web address] for contact info under Cardiology/STEMI.  Signed, Charlie Pitter, PA-C 04-11-19, 9:38 AM    I have seen and examined the patient along with Charlie Pitter, PA-C.  I have reviewed the chart, notes and new data.  I agree with PA/NP's note.  Key new complaints: improved Key examination changes: tachycardia, AFib w RVR Key new findings / data: echo reviewed  PLAN: Restart beta blocker for rate control (will start at half previous dose and titrate upwards if tolerated). Watch for hypotension as volume is removed with HD.  Sanda Klein, MD, [place] [phone] 04-11-19, 11:20 AM

## 2019-03-30 NOTE — Progress Notes (Signed)
Pt c/o nausea/dizziness. BP 56/46. Dr. [place] Crane notified. Verbal order given to re-start Levo gtt. Dr. [place] Crane still to assess pt at bedside.

## 2019-03-30 NOTE — Progress Notes (Signed)
Notified Dr. Nevada Crane that pt coded X 3. Dr [place] Crane said she will come see pt. Will continue to monitor.

## 2019-03-30 NOTE — Progress Notes (Addendum)
Pt had no signs of life at 1939 with family at bedside.Family gave the name of [facility] as [facility]. Personal belongings were accounted for and sent home with spouse. CDS was notified and postmortem care provided. Pt was taken to morgue.

## 2019-03-30 NOTE — Consult Note (Signed)
NAME:  Melissa Bartlett, MRN:  [id number], DOB:  [birth year], LOS: 5 ADMISSION DATE:  02/27/2019, CONSULTATION DATE:  3/30 REFERRING MD:  [place] Crane, CHIEF COMPLAINT:  S/p cardiac arrest   Brief History   73 yo female admitted with perianal abscess and had hypotension after I&D.  Developed worsening renal fx 3/29, and nephrology consulted.  Developed cardiac arrest 3/30 with ROSC in 4 minutes.  Remained hypotensive after arrest, and transfer to ICU.  Past Medical History  CHF, paroxysmal atrial fibrillation, CKD stage IV, hypertension, diabetes type 2, obstructive sleep apnea, obesity, RA.  Significant Hospital Events   3/25: Admitted with perianal abscess went to the OR for excision and drainage.  Pulmonary initially consulted to assist with hypotension management antibiotics started 3/27: Critical care signed off 3/28: Surgical service is signing off 3/29: Worsening renal failure and oliguria nephrology consulted 3/30: Brief PEA arrest estimated 4 minutes before return of spontaneous circulation awake and following commands post code critical care asked to see again  Consults:  Surgical services consulted 3/25, signed off 3/28 Critical care reconsulted on 3/30 Nephrology 3/29  Procedures:  Incision and drainage of perineal rectal abscess on 3/26 Left IJ triple-lumen catheter 3/30   Significant Diagnostic Tests:  CT abdomen pelvis 3/25:Superficial left perineum abscess with questionable communication to the anus measures 29 x 19 x 35 mm. See series 8, image 122. Regional cellulitis. No subcutaneous gas.  Micro Data:  Perineal abscess culture 3/26: Moderate GPC in pairs, moderate gram variable rods>>> 3/25: Blood cultures>>>  Antimicrobials:  Zosyn 3/27 through 3/28  Interim history/subjective:  Has chest soreness after CPR.  Breathing okay.  Denies nausea.  Objective   Blood pressure (!) 166/117, pulse (!) 110, temperature (!) 97.3 F (36.3 C), temperature source Axillary, resp.  rate (!) 8, height 5\' 2"  (1.575 m), weight 101.7 kg, SpO2 95 %. CVP:  [19 mmHg-22 mmHg] 20 mmHg      Intake/Output Summary (Last 24 hours) at 2019-04-21 0647 Last data filed at 2019/04/21 0600 Gross per 24 hour  Intake 1465.81 ml  Output 1500 ml  Net -34.19 ml   Filed Weights   03/28/19 1158 03/28/19 2145 Apr 21, 2019 0015  Weight: 112 kg 103.2 kg 101.7 kg    Examination:  General - alert Eyes - pupils reactive ENT - CPAP on Cardiac - regular rate/rhythm, no murmur Chest - equal breath sounds b/l, no wheezing or rales Abdomen - soft, non tender, + bowel sounds Extremities - 1+ edema Skin - no rashes Neuro - normal strength, moves extremities, follows commands   Assessment & Plan:   PEA arrest in setting of narcotic pain medications with renal failure. Plan - monitor on tele - avoid narcotics  Acute on chronic renal failure CKD stage IV w/ hyperkalemia. Plan - HD per renal  Obstructive sleep apnea. Plan - CPAP qhs  DM w/ hyperglycemia. Plan - SSI  Paroxysmal A fib. Chronic diastolic HF. Plan - monitor hemodynamics  GERD Plan - protonix  S/p I&D of perirectal abscess Plan - wound care  Hx of rheumatoid arthritis. Plan - prednisone 5 mg daily  Best practice:  Diet: renal diet DVT prophylaxis: scd GI prophylaxis: ppi Mobility: OOB to chair Code Status: full code   Disposition: transfer to progressive care.  Keep on hospitalist service.  PCCM will sign off.  Labs    CMP Latest Ref Rng & Units 03/28/2019 03/28/2019 03/27/2019  Glucose 70 - 99 mg/dL 183(H) 195(H) 103(H)  BUN 8 - 23 mg/dL 122(H) 120(H)  108(H)  Creatinine 0.44 - 1.00 mg/dL 8.00(H) 8.06(H) 6.78(H)  Sodium 135 - 145 mmol/L 136 134(L) 136  Potassium 3.5 - 5.1 mmol/L 4.8 5.2(H) 5.5(H)  Chloride 98 - 111 mmol/L 98 97(L) 102  CO2 22 - 32 mmol/L 25 20(L) 15(L)  Calcium 8.9 - 10.3 mg/dL 6.9(L) 7.0(L) 7.4(L)  Total Protein 6.5 - 8.1 g/dL 4.9(L) - -  Total Bilirubin 0.3 - 1.2 mg/dL 1.0 - -   Alkaline Phos 38 - 126 U/L 135(H) - -  AST 15 - 41 U/L 201(H) - -  ALT 0 - 44 U/L 351(H) - -   CBC Latest Ref Rng & Units 03/28/2019 03/27/2019 03/17/2019  WBC 4.0 - 10.5 K/uL 11.1(H) 11.6(H) 14.1(H)  Hemoglobin 12.0 - 15.0 g/dL 10.1(L) 10.8(L) 11.0(L)  Hematocrit 36.0 - 46.0 % 33.3(L) 35.8(L) 37.4  Platelets 150 - 400 K/uL 80(L) 92(L) 132(L)   ABG    Component Value Date/Time   PHART 7.303 (L) 03/28/2019 1340   PCO2ART 42.1 03/28/2019 1340   PO2ART 364 (H) 03/28/2019 1340   HCO3 20.2 03/28/2019 1340   ACIDBASEDEF 5.0 (H) 03/28/2019 1340   O2SAT 97.8 03/28/2019 1340   CBG (last 3)  Recent Labs    03/28/19 1256 03/28/19 1544 03/28/19 2152  GLUCAP 157* [place], MD [facility] 2019-04-05, 6:57 AM

## 2019-03-30 NOTE — Progress Notes (Signed)
  Placed patient on CPAP per order.  Patients home settings are 15 cm H20. Decreased to 12 cm H20 per pt. Comfort. States pressure on hospital machine is stronger. Patient tolerating well. Pt understands to call for Respiratory if any assistance needed with CPAP.

## 2019-03-30 NOTE — Progress Notes (Signed)
Occupational Therapy Treatment Patient Details Name: Melissa Bartlett MRN: [id number] DOB: Jan 17, 1946 Today's Date: 2019-04-02    History of present illness Melissa Bartlett is a 73 y.o. female with medical history significant of CHF, CKD, diabetes, essential hypertension, morbid obesity, obstructive sleep apnea, paroxysmal atrial fibrillation, rheumatoid arthritis. 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 PEA arrest on 3/30.    OT comments  Patient agreeable to OT/PT session.  Limited to bed level session due to hypotensive (symptomatic), increasing lethargy, dizziness and nausea, as well as changes in responsiveness with increased lethargy.  BP: 127/76 supine, 114/75 EOB, 96/69 sustained EOB, and 89/46 supine.  HR fluctuating between 100s to 130s.  RN present due to concerns and present upon therapists exit.     Follow Up Recommendations  No OT follow up;Supervision/Assistance - 24 hour    Equipment Recommendations  None recommended by OT    Recommendations for Other Services      Precautions / Restrictions Precautions Precautions: Fall Restrictions Weight Bearing Restrictions: No       Mobility Bed Mobility Overal bed mobility: Needs Assistance Bed Mobility: Supine to Sit;Sit to Supine     Supine to sit: Mod assist;+2 for safety/equipment Sit to supine: Max assist;+2 for physical assistance;+2 for safety/equipment   General bed mobility comments: mod assist for LB and trunk support to EOB, max assist +2 to return to supine due to nausea, hypotensive  Transfers Overall transfer level: Needs assistance Equipment used: 1 person hand held assist Transfers: Sit to/from Stand Sit to Stand: Min assist         General transfer comment: stood and took side steps to [facility]    Balance Overall balance assessment: Needs assistance Sitting-balance support: Bilateral upper extremity  supported;Feet supported Sitting balance-Leahy Scale: Fair Sitting balance - Comments: min guard for safety,   Standing balance support: During functional activity Standing balance-Leahy Scale: Poor                             ADL either performed or assessed with clinical judgement   ADL Overall ADL's : Needs assistance/impaired Eating/Feeding: Supervision/ safety;Set up;Sitting   Grooming: Wash/dry face;Min guard;Sitting Grooming Details (indicate cue type and reason): min guard sitting EOB                    Toilet Transfer Details (indicate cue type and reason): deferred           General ADL Comments: pt with decreased activity tolerance, limited today by nausea and light headed at [place]: Lethargic Behavior During Therapy: WFL for tasks assessed/performed Overall Cognitive Status: Within Functional Limits for tasks assessed                                          Exercises     Shoulder Instructions       General Comments BP supine 127/76, EOB 114/75, sustained EOB 96/69, supine 89/46 with RN present     Pertinent Vitals/ Pain       Pain Assessment: Faces Faces Pain Scale: Hurts a little bit Pain Location: perianal surgical site, chest soreness  Pain Descriptors / Indicators: Operative site guarding;Grimacing;Discomfort;Sore Pain Intervention(s): Limited activity within patient's tolerance;Monitored during session;Repositioned  Home Living                                          Prior Functioning/Environment              Frequency  Min 2X/week        Progress Toward Goals  OT Goals(current goals can now be found in the care plan section)  Progress towards OT goals: Progressing toward goals  Acute Rehab OT Goals Patient Stated Goal: none stated Time For Goal Achievement: 04/11/19 Potential to Achieve Goals:  Good  Plan Discharge plan remains appropriate;Frequency remains appropriate    Co-evaluation      Reason for Co-Treatment: Complexity of the patient's impairments (multi-system involvement);For patient/therapist safety PT goals addressed during session: Mobility/safety with mobility        AM-PAC OT "6 Clicks" Daily Activity     Outcome Measure   Help from another person eating meals?: None Help from another person taking care of personal grooming?: A Little Help from another person toileting, which includes using toliet, bedpan, or urinal?: A Little Help from another person bathing (including washing, rinsing, drying)?: A Little Help from another person to put on and taking off regular upper body clothing?: A Little Help from another person to put on and taking off regular lower body clothing?: A Lot 6 Click Score: 18    End of Session    OT Visit Diagnosis: Unsteadiness on feet (R26.81);Other abnormalities of gait and mobility (R26.89);Muscle weakness (generalized) (M62.81);Pain Pain - Right/Left: Left Pain - part of body: (peri area, chest soreness)   Activity Tolerance Treatment limited secondary to medical complications (Comment)   Patient Left in bed;with call bell/phone within reach;with nursing/sitter in room   Nurse Communication Mobility status;Other (comment);Precautions(BP)        Time: 2956-2130 OT Time Calculation (min): 29 min  Charges: OT General Charges $OT Visit: 1 Visit OT Treatments $Self Care/Home Management : 8-22 mins  Delight Stare, [place] Pager [phone] Office [phone]    Delight Stare 04-02-2019, 2:05 PM

## 2019-03-30 NOTE — Code Documentation (Signed)
PATIENT NAME: [name] RECORD NUMBER: [id number] Birthday: 1946-07-13  Age: 73 y.o. Admit Date: 03/05/2019  Provider: B. Moshe Cipro, NP  Indication: Bradycardia-> PEA   Technical Description:   CPR performance duration: 2 Mins  Was defibrillation or cardioversion used ? no  Was external pacer placed ? yes  Was patient intubated pre/post CPR ? yes  Was transvenous pacer placed ? no  Medications Administered Include      Yes/no Amiodarone   Atropin   Calcium   Epinephrine  1  Lidocaine   Magnesium   Norepinephrine   Phenylephrine   Sodium bicarbonate  1 amp  Vasopression    Evaluation  Final Status - Was patient successfully resuscitated ? yes  If successfully resuscitated - what is current rhythm ? ST If successfully resuscitated - what is current hemodynamic status ? unstable  Miscellaneous Information  On Levophed 40 mcg/min, dopamine 5 mcg/kg/min, EPI 15 mcg/min, and bicarb gtt prior to becoming bradycardia progressing to PEA.  Rhtym strip appears to be slow Aflutter possibly.  Additionally, ETT full with pink /frothy sputum.  CBG ok  CPR performed with ROSC after two mins.  Husband brought back and said no more CPR that she would not want this.  She was made a DNR, gtts continued however her blood pressure continued to decline in addition to bradycardia.  Husband at bedside with chaplin.     Kennieth Rad, MSN, AGACNP-BC [place] [facility] Pgr: [phone] or if no answer [phone] 04/04/2019, 7:23 PM

## 2019-03-30 NOTE — Patient Outreach (Signed)
[facility]) Care Management  03/30/2019  PERLA ECHAVARRIA 11-Jun-1946 [id number]    Case closed pt is deceased.  Raina Mina, RN Care Management Coordinator [place] Office [phone]

## 2019-03-30 NOTE — Progress Notes (Signed)
Dialysis Coordinator (DC) is attempting to reach patient to arrange outpatient HD seat. Left message on patient's cell phone-patient identifies herself on VM. DC spoke with RN who states patient is currently working with PT and will have patient call DC when finished. DC greatly appreciates assistance from RN in contacting patient.   Alphonzo Cruise Dialysis Coordinator [phone]

## 2019-03-30 NOTE — Progress Notes (Signed)
   2019-04-20 1900  Clinical Encounter Type  Visited With Patient not available;Family;Health care provider  Visit Type Initial;Code  Referral From Other (Comment) (code blue pg)  Spiritual Encounters  Spiritual Needs Emotional  Stress Factors  Family Stress Factors Major life changes;Loss of control   Responded to code blue.  Spoke w/ one of the three clergy who are present to support pt's family member.  Spoke w/ care RNs.  Chaplain remains available overnight.  Pls pg for add'l support.  Myra Gianotti resident, [phone]

## 2019-03-30 DEATH — deceased

## 2019-04-05 ENCOUNTER — Telehealth: Payer: Self-pay | Admitting: *Deleted

## 2019-04-05 NOTE — Telephone Encounter (Signed)
Received Original D/C from [facility] ,D/C forwarded to [place] on [facility] for signature.

## 2019-04-06 ENCOUNTER — Encounter (HOSPITAL_COMMUNITY): Payer: Medicare Other

## 2019-04-06 NOTE — Telephone Encounter (Signed)
Received signed original D/C,Funeral home notified for pick up.

## 2019-04-07 ENCOUNTER — Ambulatory Visit: Payer: Medicare Other | Admitting: *Deleted

## 2019-04-18 ENCOUNTER — Ambulatory Visit: Payer: Medicare Other | Admitting: Cardiology

## 2019-04-29 NOTE — Progress Notes (Signed)
Fentanyl was wasted, 265ml. Witnessed per Arcelia Jew. RN

## 2019-04-29 NOTE — Death Summary Note (Signed)
HARUMI YAMIN was a 73 y.o. female admitted on 03/22/2019 with perianal abscess and hypotension.  She had I&D by surgery and  Started on antibiotics.  She developed worsening renal function and nephrology consulted.  She developed PEA cardiac arrest on 03/28/19 with ROSC in 4 minutes, and cardiology consulted.  This was felt to be related to narcotic medication use in setting of renal failure and sleep disordered breathing.  She improved after being placed on Bipap.  She was seen by nephrology for worsening renal failure.  she developed progressive lethargy during the day on April 28, 2019.    She developed nausea, dizziness and hypotension.  Started on levophed.  Developed PEA arrest and required intubation.  She had ROSC, but then developed cardiac arrest again.  This happened several times.  Family arrived at bedside, and requested that resuscitative efforts be stopped due to grim prognosis for meaningful recovery.  She expired on 04-28-2019 at 1939.   Final diagnoses: PEA cardiac arrest Acute on chronic hypoxic, hypercapnic respiratory failure Sleep disordered breathing with obstructive sleep apnea and obesity hypoventilation syndrome Opiate induced hypoventilation Cor pulmonale Cardiogenic shock Chronic diastolic CHF Acute renal failure with ATN Hyperkalemia CKD 5 Perianal abscess with sepsis Diabetes mellitus Hypoglycemia Anemia of critical illness and chronic disease   Permanent A fib with RVR on chronic coumadin Elevated troponin from demand ischemia History of rheumatoid arthritis on chronic prednisone  Chesley Mires, MD [facility] Pulmonary/Critical Care 04/04/2019, 9:32 AM

## 2019-05-27 IMAGING — CT CT PELVIS WITHOUT CONTRAST
2 of 3 series · 15 of 46 positions shown, 17 images · non-contrast
Comparison: None.

CLINICAL DATA: 72-year-old female with perineum abscess.

EXAM:
CT PELVIS WITHOUT CONTRAST
TECHNIQUE: Multidetector CT imaging of the pelvis was performed following the
standard protocol without intravenous contrast.

[Series 3: pelvis 2.0 st · axial · 0.84mm/px · z∈[-795,-569]mm · 12 of 131 slices shown, 14 images]
[im 9/131  soft-tissue]
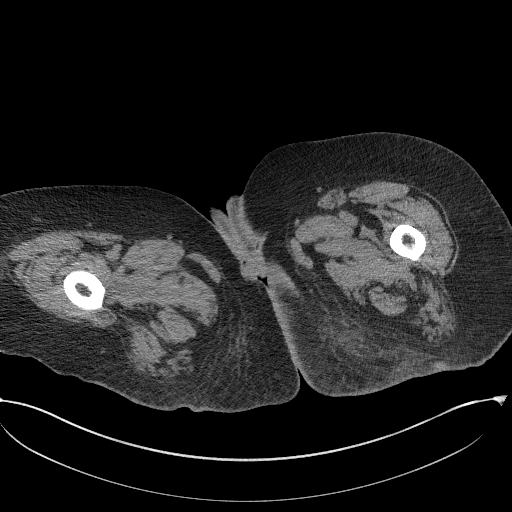
[im 9/131  bone]
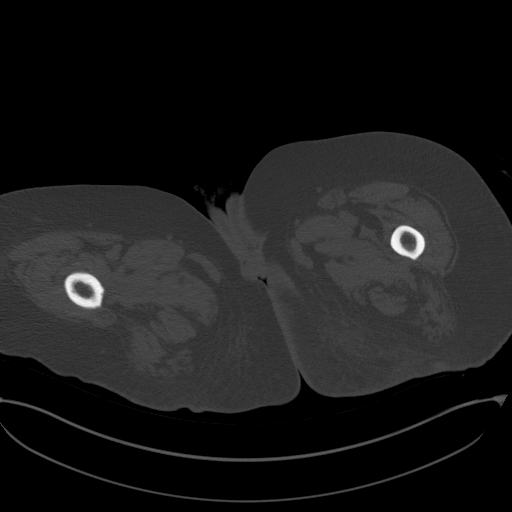
[im 17/131  soft-tissue]
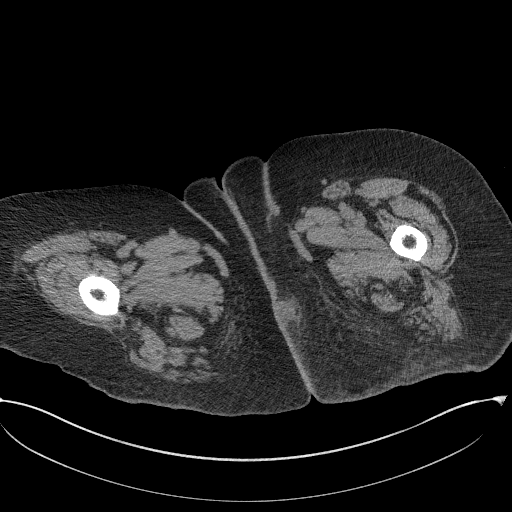
[im 30/131  soft-tissue]
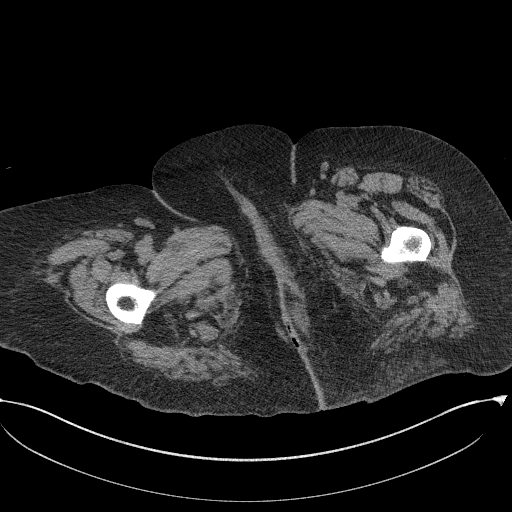
[im 38/131  soft-tissue]
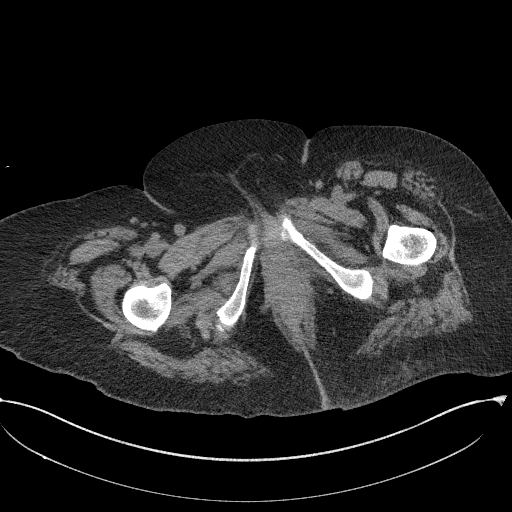
[im 51/131  soft-tissue]
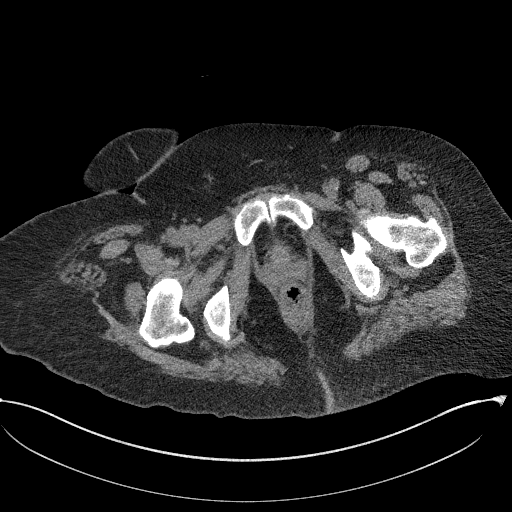
[im 59/131  soft-tissue]
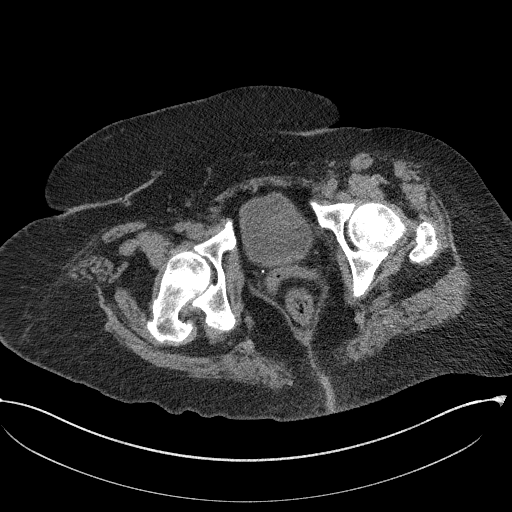
[im 72/131  soft-tissue]
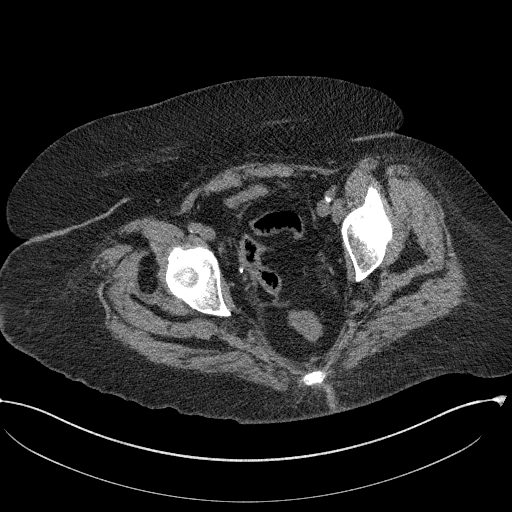
[im 80/131  soft-tissue]
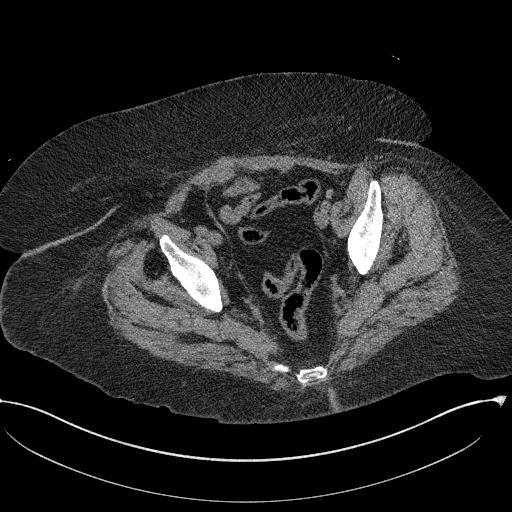
[im 93/131  soft-tissue]
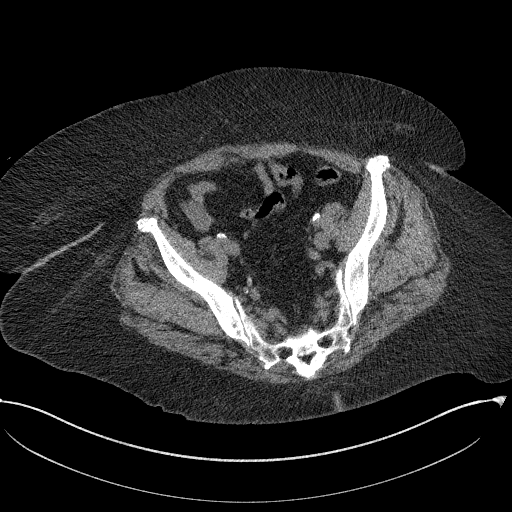
[im 93/131  bone]
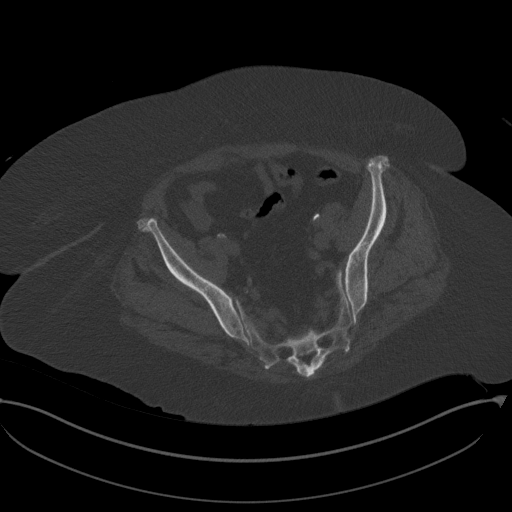
[im 101/131  soft-tissue]
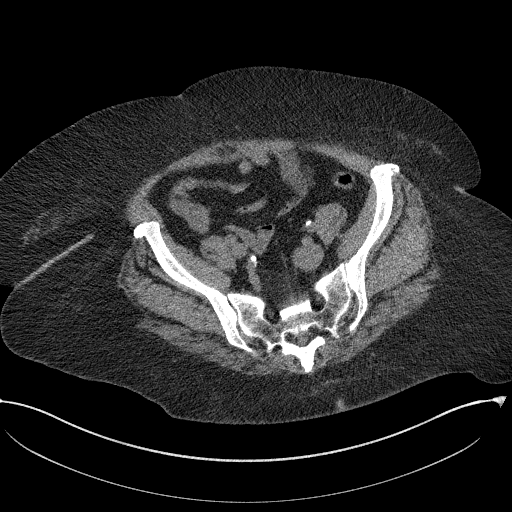
[im 114/131  soft-tissue]
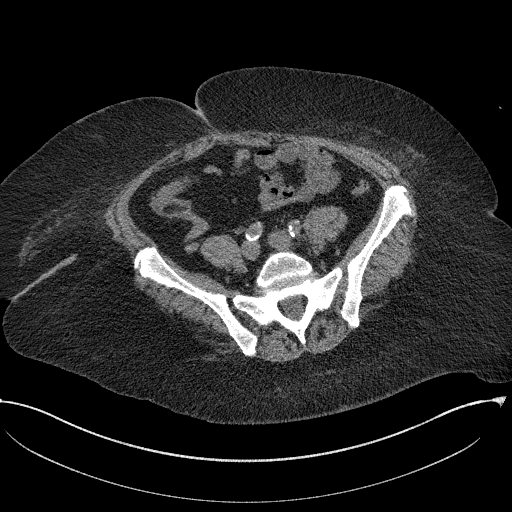
[im 122/131  soft-tissue]
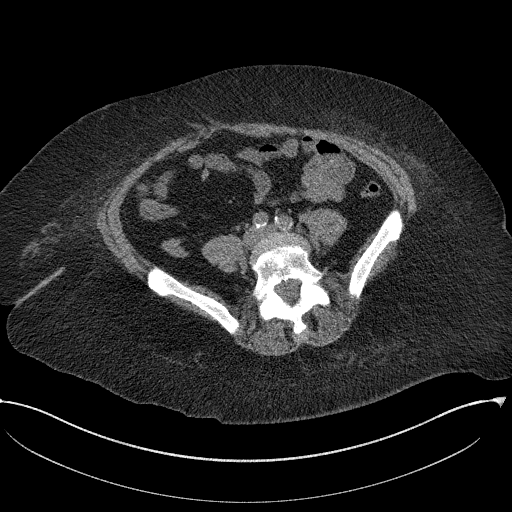

[Series 8: coronal st · coronal · 0.51mm/px · 3 of 169 slices shown]
[im 57/169  soft-tissue]
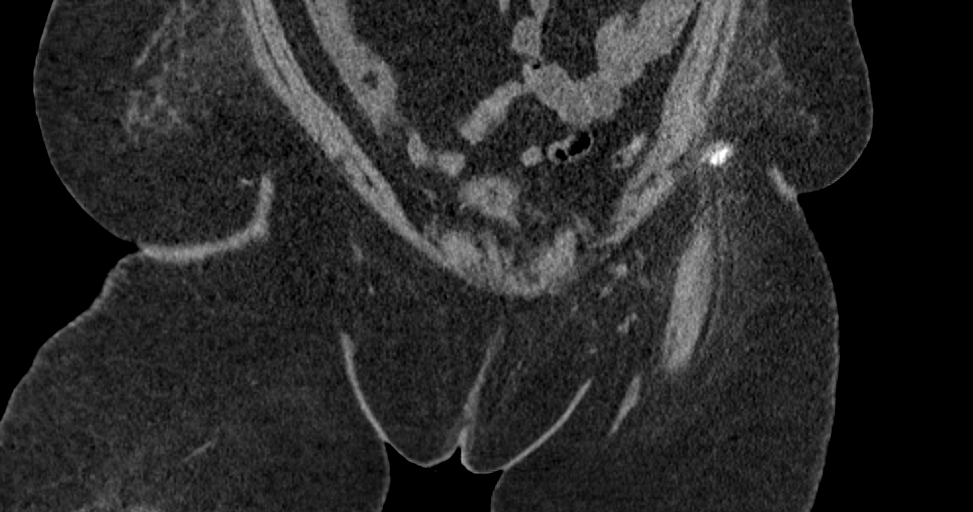
[im 75/169  soft-tissue]
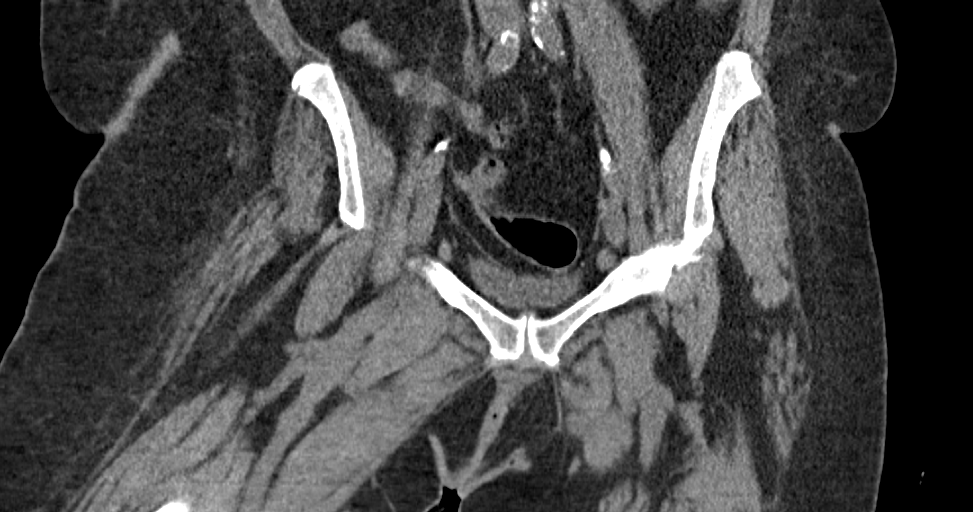
[im 94/169  soft-tissue]
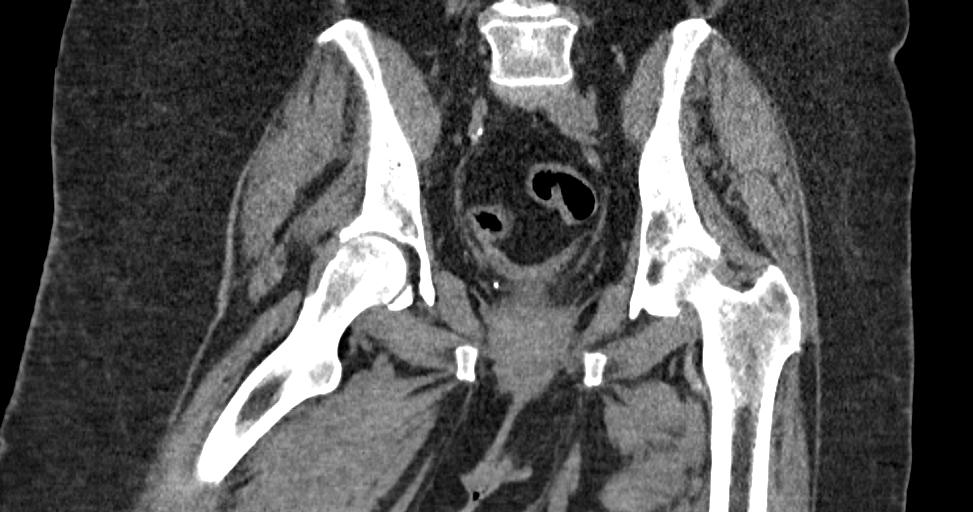

[15 of 46 positions shown; findings below may reference images not displayed]

FINDINGS: Urinary Tract: Kidneys not included. Diminutive and unremarkable
urinary bladder. Distal ureters are decompressed. Incidental pelvic
phleboliths.

Bowel:  Negative visible large and small bowel.

Vascular/Lymphatic: Aortoiliac calcified atherosclerosis. Vascular
patency is not evaluated in the absence of IV contrast.

Inguinal and pelvic lymph nodes appear symmetric and within normal
limits.

Reproductive:  Surgically absent.

Other: Large body habitus. Left posterior perineum inflammatory
stranding which may be contiguous with the anus on series 3, image
103. In the superficial medial left buttock there is a 29 x 19 x 35
millimeter round subcutaneous abscess. See series 3, image 110 and
series 8, image 118. Regional inflammatory stranding. No soft tissue
gas.

Musculoskeletal: No acute osseous abnormality identified.
Degenerative changes in the lower lumbar spine.
IMPRESSION: Superficial left perineum abscess with questionable communication to
the anus measures 29 x 19 x 35 mm. See series 8, image 122. Regional
cellulitis. No subcutaneous gas.
